# Patient Record
Sex: Female | Born: 1935 | Race: Black or African American | Hispanic: No | Marital: Married
Health system: Southern US, Community
[De-identification: ages and names within clinical notes are randomized; demographics above are authoritative.]

## PROBLEM LIST (undated history)

## (undated) DIAGNOSIS — I739 Peripheral vascular disease, unspecified: Secondary | ICD-10-CM

## (undated) DIAGNOSIS — E039 Hypothyroidism, unspecified: Secondary | ICD-10-CM

## (undated) DIAGNOSIS — F32A Depression, unspecified: Secondary | ICD-10-CM

## (undated) DIAGNOSIS — E785 Hyperlipidemia, unspecified: Secondary | ICD-10-CM

## (undated) DIAGNOSIS — K922 Gastrointestinal hemorrhage, unspecified: Secondary | ICD-10-CM

## (undated) DIAGNOSIS — I96 Gangrene, not elsewhere classified: Secondary | ICD-10-CM

## (undated) DIAGNOSIS — M869 Osteomyelitis, unspecified: Secondary | ICD-10-CM

## (undated) DIAGNOSIS — I639 Cerebral infarction, unspecified: Secondary | ICD-10-CM

## (undated) DIAGNOSIS — E119 Type 2 diabetes mellitus without complications: Secondary | ICD-10-CM

## (undated) DIAGNOSIS — G20 Parkinson's disease: Secondary | ICD-10-CM

## (undated) DIAGNOSIS — D539 Nutritional anemia, unspecified: Secondary | ICD-10-CM

## (undated) DIAGNOSIS — I82409 Acute embolism and thrombosis of unspecified deep veins of unspecified lower extremity: Secondary | ICD-10-CM

## (undated) DIAGNOSIS — Z992 Dependence on renal dialysis: Secondary | ICD-10-CM

## (undated) DIAGNOSIS — S82409A Unspecified fracture of shaft of unspecified fibula, initial encounter for closed fracture: Secondary | ICD-10-CM

## (undated) DIAGNOSIS — I10 Essential (primary) hypertension: Secondary | ICD-10-CM

## (undated) DIAGNOSIS — F329 Major depressive disorder, single episode, unspecified: Secondary | ICD-10-CM

## (undated) DIAGNOSIS — J302 Other seasonal allergic rhinitis: Secondary | ICD-10-CM

## (undated) DIAGNOSIS — I4891 Unspecified atrial fibrillation: Secondary | ICD-10-CM

## (undated) DIAGNOSIS — F0390 Unspecified dementia without behavioral disturbance: Secondary | ICD-10-CM

## (undated) DIAGNOSIS — G20A1 Parkinson's disease without dyskinesia, without mention of fluctuations: Secondary | ICD-10-CM

## (undated) DIAGNOSIS — S82209A Unspecified fracture of shaft of unspecified tibia, initial encounter for closed fracture: Secondary | ICD-10-CM

## (undated) DIAGNOSIS — N186 End stage renal disease: Secondary | ICD-10-CM

## (undated) HISTORY — DX: End stage renal disease: N18.6

## (undated) HISTORY — DX: Type 2 diabetes mellitus without complications: E11.9

## (undated) HISTORY — DX: Peripheral vascular disease, unspecified: I73.9

## (undated) HISTORY — PX: COLON SURGERY: SHX602

## (undated) HISTORY — DX: Gastrointestinal hemorrhage, unspecified: K92.2

## (undated) HISTORY — PX: CHOLECYSTECTOMY: SHX55

## (undated) HISTORY — PX: OTHER SURGICAL HISTORY: SHX169

## (undated) HISTORY — PX: DG AV DIALYSIS GRAFT DECLOT OR: HXRAD813

## (undated) HISTORY — DX: Dependence on renal dialysis: Z99.2

## (undated) HISTORY — DX: Hyperlipidemia, unspecified: E78.5

## (undated) HISTORY — DX: Cerebral infarction, unspecified: I63.9

## (undated) HISTORY — DX: Essential (primary) hypertension: I10

## (undated) HISTORY — PX: LEG AMPUTATION ABOVE KNEE: SHX117

## (undated) HISTORY — DX: Acute embolism and thrombosis of unspecified deep veins of unspecified lower extremity: I82.409

## (undated) HISTORY — PX: FINGER AMPUTATION: SHX636

---

## 2002-09-03 ENCOUNTER — Ambulatory Visit (HOSPITAL_COMMUNITY): Admission: RE | Admit: 2002-09-03 | Discharge: 2002-09-03 | Payer: Self-pay | Admitting: Internal Medicine

## 2002-09-06 ENCOUNTER — Encounter: Payer: Self-pay | Admitting: Vascular Surgery

## 2002-09-06 ENCOUNTER — Ambulatory Visit (HOSPITAL_COMMUNITY): Admission: RE | Admit: 2002-09-06 | Discharge: 2002-09-06 | Payer: Self-pay | Admitting: Vascular Surgery

## 2004-02-07 HISTORY — PX: TRANSTHORACIC ECHOCARDIOGRAM: SHX275

## 2004-03-08 HISTORY — PX: CARDIAC CATHETERIZATION: SHX172

## 2004-11-20 ENCOUNTER — Ambulatory Visit: Payer: Self-pay | Admitting: Cardiology

## 2005-05-03 ENCOUNTER — Ambulatory Visit: Payer: Self-pay | Admitting: Cardiology

## 2005-07-11 ENCOUNTER — Ambulatory Visit: Payer: Self-pay | Admitting: Cardiology

## 2007-02-17 ENCOUNTER — Ambulatory Visit: Payer: Self-pay | Admitting: Cardiology

## 2008-11-23 ENCOUNTER — Ambulatory Visit: Payer: Self-pay | Admitting: Cardiology

## 2009-10-22 ENCOUNTER — Encounter: Payer: Self-pay | Admitting: Physician Assistant

## 2009-10-23 ENCOUNTER — Ambulatory Visit: Payer: Self-pay | Admitting: Cardiology

## 2009-10-23 ENCOUNTER — Encounter: Payer: Self-pay | Admitting: Physician Assistant

## 2009-11-14 ENCOUNTER — Ambulatory Visit: Payer: Self-pay | Admitting: Cardiology

## 2009-11-14 DIAGNOSIS — I5022 Chronic systolic (congestive) heart failure: Secondary | ICD-10-CM | POA: Insufficient documentation

## 2009-11-22 ENCOUNTER — Ambulatory Visit: Payer: Self-pay | Admitting: Cardiology

## 2010-01-04 ENCOUNTER — Encounter: Payer: Self-pay | Admitting: Cardiology

## 2010-01-05 ENCOUNTER — Encounter: Payer: Self-pay | Admitting: Cardiology

## 2010-01-05 ENCOUNTER — Encounter: Payer: Self-pay | Admitting: Physician Assistant

## 2010-01-08 ENCOUNTER — Encounter: Payer: Self-pay | Admitting: Cardiology

## 2010-01-10 ENCOUNTER — Encounter: Payer: Self-pay | Admitting: Physician Assistant

## 2010-01-11 ENCOUNTER — Encounter: Payer: Self-pay | Admitting: Physician Assistant

## 2010-01-15 ENCOUNTER — Encounter (INDEPENDENT_AMBULATORY_CARE_PROVIDER_SITE_OTHER): Payer: Self-pay | Admitting: *Deleted

## 2010-01-20 ENCOUNTER — Encounter: Payer: Self-pay | Admitting: Cardiology

## 2010-01-20 ENCOUNTER — Encounter: Payer: Self-pay | Admitting: Physician Assistant

## 2010-01-27 ENCOUNTER — Encounter: Payer: Self-pay | Admitting: Cardiology

## 2010-01-28 ENCOUNTER — Encounter: Payer: Self-pay | Admitting: Cardiology

## 2010-01-31 ENCOUNTER — Encounter: Payer: Self-pay | Admitting: Physician Assistant

## 2010-01-31 ENCOUNTER — Encounter: Payer: Self-pay | Admitting: Cardiology

## 2010-02-16 ENCOUNTER — Ambulatory Visit: Admit: 2010-02-16 | Payer: Self-pay | Admitting: Cardiology

## 2010-03-06 NOTE — Assessment & Plan Note (Signed)
Summary: eph/rm   Visit Type:  Follow-up Primary Rommel [place]ston:  Dhruv Vyas,MD   History of Present Illness: patient presents for post hospital followup  Recently referred to [place] here at [facility] with acute congestive heart failure. She presented with history of normal coronary arteries by catheterization in 2006. She had a normal adenosine Cardiolite in 2009. We diagnosed her with acute/chronic DHF, and referred her for a 2-D echo. Of note, serial cardiac markers were nondiagnostic, with flat troponins in the 0.15-0.2 range, with normal MBs. Of note, she does have ESRD, on hemodialysis.  2-D echo, workup indicated new LVD (EF 30-35%). Dr. Andee Lineman questioned whether this was secondary to uremic cardiomyopathy. She was started on low dose Coreg. He recommended a repeat echo in 6-8 weeks.  Clinically, she appears to be doing better, per her caretaker. Of note, however, the patient does have significant dementia, and is a very poor historian.  Current Medications (verified): 1)  Namenda 10 Mg Tabs (Memantine Hcl) .... Take 1 Tablet By Mouth Two Times A Day 2)  Lipitor 10 Mg Tabs (Atorvastatin Calcium) .... Take 1 Tablet By Mouth Once A Day 3)  Ramipril 5 Mg Caps (Ramipril) .... Take 2 By Mouth in Morning and 1 in Pm 4)  Fluoxetine Hcl 40 Mg Caps (Fluoxetine Hcl) .... Take 1 Tablet By Mouth Once A Day 5)  Donepezil Hcl 10 Mg Tabs (Donepezil Hcl) .... Take 1 Tablet By Mouth Once A Day 6)  Amlodipine Besylate 10 Mg Tabs (Amlodipine Besylate) .... Take 1 Tablet By Mouth Once A Day 7)  Glipizide Xl 10 Mg Xr24h-Tab (Glipizide) .... Take 1 Tablet By Mouth Two Times A Day 8)  Rena-Vite  Tabs (B Complex-C-Folic Acid) .... Take 1 Tablet By Mouth Once A Day 9)  Coreg 6.25 Mg Tabs (Carvedilol) .... Take 1 Tablet By Mouth Two Times A Day 10)  Lantus 100 Unit/ml Soln (Insulin Glargine) .... Use As Directed 11)  Aspirin 81 Mg Tbec (Aspirin) .... Take 1 Tablet By Mouth Once A Day  Allergies (verified): No Known  Drug Allergies  Comments:  Nurse/Medical Assistant: [name] medication list and allergies were reviewed with the patient and were updated in the Medication and Allergy Lists.  Past History:  Past Medical History: cardiomyopathy... EF 30-35%% by 2-D echo, 9/11 (normal cors/LVF by catheterization in 2006) ESRD, on hemodialysis IDDM HTN History of stroke Mitral regurgitation Anemia PACs  Review of Systems       No fevers, chills, hemoptysis, dysphagia, melena, hematocheezia, hematuria, rash, claudication, orthopnea, pnd, pedal edema. All other systems negative.   Vital Signs:  Patient profile:   77 year old female Height:      68 inches Weight:      190 pounds BMI:     28.99 Pulse rate:   81 / minute BP sitting:   181 / 103  (left arm) Cuff size:   large  Vitals Entered By: Carlye Grippe (November 14, 2009 11:23 AM)  Nutrition Counseling: Patient's BMI is greater than 25 and therefore counseled on weight management options.  Physical Exam  Additional Exam:  GEN: 77 year old female, obese, sitting upright, in no distress HEENT: NCAT,PERRLA,EOMI NECK: palpable pulses, no bruits; no JVD; no TM LUNGS: CTA bilaterally HEART: RRR (S1S2); no significant murmurs; no rubs; no gallops ABD: soft, NT; intact BS EXT: intact distal pulses; no significant edema SKIN: warm, dry MUSC: no obvious deformity NEURO: A/O (x3)     Impression & Recommendations:  Problem # 1:  CHRONIC SYSTOLIC  HEART FAILURE (ICD-428.22)  recommend optimizing medical therapy. Will increase carvedilol to 6.25 mg b.i.d. We'll repeat 2-D echo in 2 months, for reassessment of LVF. Will defer ischemic workup for now, per Dr. Margarita Mail recommendation. patient had normal cardiac catheterization 2006, and normal Cardiolite in 2009. Question uremic cardiomyopathy.  Problem # 2:  HYPERTENSION (ICD-401.9)  carvedilol will be increased to 6.25 mg b.i.d. may need to up titrate ACE inhibitor, if blood pressure  remains uncontrolled.  Problem # 3:  DM (ICD-250.00) Assessment: Comment Only  Problem # 4:  ESRD (ICD-585.6)  continue hemodialysis, as scheduled.  Other Orders: 2-D Echocardiogram (2D Echo)  Patient Instructions: 1)  2D Echo in 2 months 2)  Increase Coreg to 6.25mg  two times a day  3)  Start Aspirin 81mg  daily 4)  Follow up in  3 months Prescriptions: COREG 6.25 MG TABS (CARVEDILOL) Take 1 tablet by mouth two times a day  #60 x 6   Entered by:   Hoover Brunette, LPN   Authorized by:   Nelida Meuse, PA-C   Signed by:   Hoover Brunette, LPN on 32/20/2542   Method used:   Electronically to        [facility]* (retail)       [address]       [place], [place]  [address]       Ph: [phone]       Fax: [phone]   RxID:   [id number]  I have reviewed and approved all prescriptions at the time of the office visit. Nelida Meuse, PA-C  November 14, 2009 12:19 PM

## 2010-03-06 NOTE — Consult Note (Signed)
Summary: [facility]OLOG[facility] CONSULT/ [facility]  CARD[facility]OLOG[facility] CONSULT/ [facility]   Imported By: Zachary George 11/14/2009 12:03:17  _____________________________________________________________________  External Attachment:    Type:   Image     Comment:   External Document

## 2010-03-08 NOTE — Letter (Signed)
Summary: Engineer, materials at [facility]  [address]   [place], [place] [address]   Phone: [phone]  Fax: [phone]        January 15, 2010 MRN: [id number]   Katrina Meyer [address] [facility] [address] [place], [place]  [address]   Dear Ms. GEISINGER,  Your test ordered by Selena Batten has been reviewed by your physician (or physician assistant) and was found to be normal or stable. Your physician (or physician assistant) felt no changes were needed at this time.  __X__ Echocardiogram - improved to 45-50%  ____ Cardiac Stress Test  ____ Lab Work  ____ Peripheral vascular study of arms, legs or neck  ____ CT scan or X-ray  ____ Lung or Breathing test  ____ Other:   Thank you.   Hoover Brunette, LPN    Duane Boston, M.D., F.A.C.C. Thressa Sheller, M.D., F.A.C.C. Oneal Grout, M.D., F.A.C.C. Cheree Ditto, M.D., F.A.C.C. Daiva Nakayama, M.D., F.A.C.C. Kenney Houseman, M.D., F.A.C.C. Jeanne Ivan, PA-C

## 2010-03-08 NOTE — Letter (Signed)
Summary: [facility] D/C DR. DHRUV VYAS  [facility] D/C DR. DHRUV VYAS   Imported By: Zachary George 02/16/2010 11:12:13  _____________________________________________________________________  External Attachment:    Type:   Image     Comment:   External Document

## 2010-03-08 NOTE — Consult Note (Signed)
Summary: [facility]OLOGY CONSULT/ [facility]  [facility]OLOG[facility] CON[facility]ULT/ [facility]   Imported By: Zachary George 02/16/2010 11:00:14  _____________________________________________________________________  External Attachment:    Type:   Image     Comment:   External Document

## 2010-03-08 NOTE — Consult Note (Signed)
Summary: CONSULT-DR. BEFEKADU  CONSULT-DR. BEFEKADU   Imported By: Zachary George 02/16/2010 11:13:10  _____________________________________________________________________  External Attachment:    Type:   Image     Comment:   External Document

## 2010-03-08 NOTE — Letter (Signed)
Summary: [facility] D/C DR. DHRUV VYAS  [facility] D/C DR. DHRUV VYAS   Imported By: Zachary George 02/16/2010 11:03:22  _____________________________________________________________________  External Attachment:    Type:   Image     Comment:   External Document

## 2010-03-08 NOTE — Medication Information (Signed)
Summary: [facility] D/C MEDICATION SHEET ORDER  [facility] D/C MEDICATION SHEET ORDER   Imported By: Zachary George 02/16/2010 11:18:06  _____________________________________________________________________  External Attachment:    Type:   Image     Comment:   External Document

## 2010-03-08 NOTE — Medication Information (Signed)
Summary: [facility] D/C MEDICATION ORDER SHEET  [facility] D/C MEDICATION ORDER SHEET   Imported By: Zachary George 02/16/2010 10:59:19  _____________________________________________________________________  External Attachment:    Type:   Image     Comment:   External Document

## 2010-06-22 NOTE — Op Note (Signed)
NAME:  Katrina Meyer, Katrina Meyer                          ACCOUNT NO.:  [id number]   MEDICAL RECORD NO.:  [id number]                   PATIENT TYPE:  OIB   LOCATION:  2866                                 FACILITY:  [facility]   PHYSICIAN:  Di Kindle. Edilia Bo, M.D.        DATE OF BIRTH:  Jan 11, 1933   DATE OF PROCEDURE:  09/06/2002  DATE OF DISCHARGE:                                 OPERATIVE REPORT   PREOPERATIVE DIAGNOSIS:  Chronic renal failure.   POSTOPERATIVE DIAGNOSIS:  Chronic renal failure.   PROCEDURES:  1. Ultrasound of bilateral internal jugular veins.  2. Attempted placement of right internal jugular Diatek catheter.  3. Placement of left internal jugular Diatek catheter (32 cm.   SURGEON:  Di Kindle. Edilia Bo, M.D.   ANESTHESIA:  Local with sedation.   TECHNIQUE:  The patient was taken to the operating room, sedated by  anesthesia.  The neck and upper chest were prepped and draped in the usual  sterile fashion.  After the skin was infiltrated with 1% lidocaine, the  right internal jugular vein was easily cannulated; however, I could not  thread the guide wire.  I did shoot a venogram which showed that the vein  was tortuous but did appear to be patent.  However, despite multiple  attempts with the J-wire and angled Glidewire, I was unable to pass the wire  into the superior vena cava.  Rather than persist, I elected to proceed with  a left-sided approach.  After the skin was anesthetized with 1% lidocaine,  the left internal jugular vein was cannulated, and a guide wire introduced  into the superior vena cava under fluoroscopic control.  The tract over the  wire was then dilated, and then the dilator and Peel-Away sheath were passed  over the wire, and the dilator removed.  The catheter was threaded over the  wire and then passed through the Peel-Away sheath and positioned in the  right atrium.  The Peel-Away sheath was then removed.  The catheter was then  carefully  positioned, and then an exit site was selected and the skin  anesthetized between the two areas.  The catheter was then brought through  the tunnel and cut to the appropriate length, and the distal ports were  attached.  Both ports withdrew easily, were then flushed with heparinized  saline, and filled with concentrated heparin.  The catheter was secured at  its exit site with a 3-0 nylon suture.  The IJ cannulation site was closed  with a 4-0 subcuticular stitch.  Sterile dressing was applied.  The patient  tolerated the procedure well and was transferred to the recovery room in  satisfactory condition.  Di Kindle. Edilia Bo, M.D.    CSD/MEDQ  D:  09/06/2002  T:  09/06/2002  Job:  [id number]

## 2010-06-25 ENCOUNTER — Encounter (INDEPENDENT_AMBULATORY_CARE_PROVIDER_SITE_OTHER): Payer: Medicare Other

## 2010-06-25 ENCOUNTER — Encounter (INDEPENDENT_AMBULATORY_CARE_PROVIDER_SITE_OTHER): Payer: Medicare Other | Admitting: Vascular Surgery

## 2010-06-25 DIAGNOSIS — M629 Disorder of muscle, unspecified: Secondary | ICD-10-CM

## 2010-06-25 DIAGNOSIS — M79609 Pain in unspecified limb: Secondary | ICD-10-CM

## 2010-06-25 NOTE — Consult Note (Signed)
NEW PATIENT CONSULTATION  Katrina Meyer, Katrina Meyer DOB:  1932-03-28                                       06/25/2010 CHART#:[id number]  The patient is a 77 year old female with diabetes and end-stage renal disease, dialyzes Monday, Wednesday and Friday in [place].  She developed problems with her right first toe in March of this year, went to the [facility] wound [facility], foot continued to have progressive ischemic changes, was not seen again in the [place] wound center but was seen in the [place] wound center by Dr. Mills Koller last week who referred her here for further evaluation.  She has developed progressive ischemia of the right first and fifth toes as well as a pressure sore on the right heel.  She had had some ulcerations in her left leg December 2011 which healed with wound care.  She does not ambulate much.  CHRONIC MEDICAL PROBLEMS: 1. Hypertension. 2. Hyperlipidemia. 3. Diabetes mellitus type 1. 4. End-stage renal disease, hemodialysis Monday, Wednesday, Friday. 5. History of hemicolectomy for diverticulitis. 6. History of mini strokes. 7. Negative for coronary artery disease or COPD.  SOCIAL HISTORY:  She is married, has 2 children.  Does not use tobacco or alcohol.  FAMILY HISTORY:  Positive for stroke and diabetes in her mother, coronary artery disease in her father.  REVIEW OF SYSTEMS:  Positive for lower extremity discomfort.  Walks with a walker and assistance.  Has ulcerations of both toes, end-stage renal disease.  Has history of mini strokes but no specific hemiparesis or aphasia according to the daughter.  All other systems are negative on complete review of systems.  PHYSICAL EXAMINATION:  Vital signs:  Blood pressure 182/58, heart rate 87, respirations 24.  General:  She is an elderly, chronically ill- appearing female who is in no apparent distress, alert and oriented x3. HEENT:  Exam is normal for age.  EOMs intact.  Lungs:   Clear to auscultation.  No rhonchi or wheezing.  Cardiovascular:  Regular rhythm. No murmurs.  Carotid pulses 3+.  No audible bruits.  Abdomen:  Obese. No palpable masses.  Musculoskeletal:  Free of major deformities. Neurological:  Normal.  Skin:  Exam reveals gangrenous ulcer in the medial aspect of the right first toe, ischemic appearing right fifth toe and some pressure sore involving the heel.  There is no full thickness loss at this time.  Left leg is free of ulcers but has hyperpigmentation.  Arterial exam reveals 3+ femoral pulses bilaterally. No popliteal or distal pulses are palpable.  Today I ordered lower extremity arterial Doppler and duplex scan which I reviewed and interpreted.  She does have what appears to be patent superficial femoral and popliteal arteries bilaterally with no areas of high velocity.  All of her vessels are severely calcified and she has severe tibial disease bilaterally with noncompressible vessels.  I suspect that this is not a salvageable extremity but we will proceed with an angiogram to see if anything would be amenable to PTA or stenting in the right leg versus possible bypass which is very unlikely. I explained to the family that most likely she will end up needing a below knee amputation in the near future.  We will schedule her angiogram on Wednesday of this week by Dr. Myra Gianotti or Dr. Imogene Burn to see what our options are.    Quita Skye Hart [place], M.D. Electronically  Signed  JDL/MEDQ  D:  06/25/2010  T:  06/25/2010  Job:  [id number]  cc:   Doreen Beam, MD Theresia Majors. Tanda Rockers, M.D.

## 2010-06-25 NOTE — Procedures (Unsigned)
LOWER EXTREMITY ARTERIAL DUPLEX  INDICATION:  Bilateral lower extremity leg pain with foot ulcers.  HISTORY: Diabetes:  Yes. Cardiac:  No. Hypertension:  Yes. Smoking:  No. Previous Surgery:  No.  SINGLE LEVEL ARTERIAL EXAM                         RIGHT                LEFT Brachial:               211                  202 Anterior tibial:        >255                 >255 Posterior tibial:       >255                 >255 Peroneal: Ankle/Brachial Index:   Noncompressible      Noncompressible  LOWER EXTREMITY ARTERIAL DUPLEX EXAM  DUPLEX:  Scattered atherosclerotic plaque noted throughout bilateral lower extremities.  IMPRESSION: 1. Noncompressible ankle brachial indices. 2. Please see the following worksheet for all velocity measurements.  ___________________________________________ Quita Skye. Hart Rochester, M.D.  EM/MEDQ  D:  06/25/2010  T:  06/25/2010  Job:  [id number]

## 2010-06-27 ENCOUNTER — Ambulatory Visit (HOSPITAL_COMMUNITY)
Admission: RE | Admit: 2010-06-27 | Discharge: 2010-06-27 | Disposition: A | Discharge status: Home / Self Care | Payer: Medicare Other | Source: Ambulatory Visit | Attending: Surgery | Admitting: Surgery

## 2010-06-27 DIAGNOSIS — L97509 Non-pressure chronic ulcer of other part of unspecified foot with unspecified severity: Secondary | ICD-10-CM | POA: Insufficient documentation

## 2010-06-27 DIAGNOSIS — L98499 Non-pressure chronic ulcer of skin of other sites with unspecified severity: Secondary | ICD-10-CM

## 2010-06-27 DIAGNOSIS — E109 Type 1 diabetes mellitus without complications: Secondary | ICD-10-CM | POA: Insufficient documentation

## 2010-06-27 DIAGNOSIS — L8990 Pressure ulcer of unspecified site, unspecified stage: Secondary | ICD-10-CM | POA: Insufficient documentation

## 2010-06-27 DIAGNOSIS — I739 Peripheral vascular disease, unspecified: Secondary | ICD-10-CM

## 2010-06-27 DIAGNOSIS — N186 End stage renal disease: Secondary | ICD-10-CM | POA: Insufficient documentation

## 2010-06-27 DIAGNOSIS — K5730 Diverticulosis of large intestine without perforation or abscess without bleeding: Secondary | ICD-10-CM | POA: Insufficient documentation

## 2010-06-27 DIAGNOSIS — Z8673 Personal history of transient ischemic attack (TIA), and cerebral infarction without residual deficits: Secondary | ICD-10-CM | POA: Insufficient documentation

## 2010-06-27 DIAGNOSIS — L89609 Pressure ulcer of unspecified heel, unspecified stage: Secondary | ICD-10-CM | POA: Insufficient documentation

## 2010-06-27 DIAGNOSIS — I120 Hypertensive chronic kidney disease with stage 5 chronic kidney disease or end stage renal disease: Secondary | ICD-10-CM | POA: Insufficient documentation

## 2010-06-27 DIAGNOSIS — Z992 Dependence on renal dialysis: Secondary | ICD-10-CM | POA: Insufficient documentation

## 2010-06-27 LAB — POCT I-STAT, CHEM 8
Calcium, Ion: 1.2 mmol/L (ref 1.12–1.32)
Creatinine, Ser: 6.3 mg/dL — ABNORMAL HIGH (ref 0.4–1.2)
Hemoglobin: 11.9 g/dL — ABNORMAL LOW (ref 12.0–15.0)
Sodium: 139 mEq/L (ref 135–145)
TCO2: 31 mmol/L (ref 0–100)

## 2010-06-27 LAB — POCT ACTIVATED CLOTTING TIME
Activated Clotting Time: 217 seconds
Activated Clotting Time: 199 seconds

## 2010-06-27 LAB — GLUCOSE, CAPILLARY: Glucose-Capillary: 161 mg/dL — ABNORMAL HIGH (ref 70–99)

## 2010-07-12 ENCOUNTER — Encounter (INDEPENDENT_AMBULATORY_CARE_PROVIDER_SITE_OTHER): Payer: Medicare Other

## 2010-07-12 DIAGNOSIS — I739 Peripheral vascular disease, unspecified: Secondary | ICD-10-CM

## 2010-07-12 DIAGNOSIS — Z48812 Encounter for surgical aftercare following surgery on the circulatory system: Secondary | ICD-10-CM

## 2010-07-13 NOTE — Procedures (Unsigned)
LOWER EXTREMITY ARTERIAL DUPLEX    ****  CORRECTED  REPORT  ****  INDICATION:  Right left lower extremity stent.  HISTORY: Diabetes:  Yes. Cardiac:  No. Hypertension:  Yes. Smoking:  No. Previous Surgery:  Right left popliteal artery PTA/stent on 06/27/2010.  SINGLE LEVEL ARTERIAL EXAM                         RIGHT                LEFT Brachial:               184 Anterior tibial:        Biphasic             Monophasic Posterior tibial:       Monophasic           Monophasic Peroneal: Ankle/Brachial Index:                        TBI equals 0.43  LOWER EXTREMITY ARTERIAL DUPLEX EXAM  DUPLEX:  Biphasic Doppler waveforms noted throughout the right left common femoral and superficial femoral arteries with no increase in velocity. Monophasic Doppler waveforms noted throughout the right left popliteal artery with no increase in velocities. Decreased visualization of the right left lower extremity arterial system due to calcific shadowing.  IMPRESSION: 1. Patent right  left popliteal artery stent with no hemodynamically     significant increase in velocities noted, based on limited     visualization. 2. Unable to obtain bilateral and brachial indices due to known     noncompressible vessels.  The right toe brachial index was not     obtained due to wound location.  The left toe brachial index     suggests moderately decreased perfusion of the left lower extremity     digits.  ___________________________________________ Quita Skye Hart [place], M.D.  CH/MEDQ  D:  07/13/2010  T:  07/13/2010  Job:  [id number]

## 2010-07-13 NOTE — Op Note (Signed)
NAMEJENNIGER, Katrina Meyer NO.:  [id number]  MEDICAL RECORD NO.:  [id number]           PATIENT TYPE:  O  LOCATION:  SDSC                         FACILITY:  [facility]  PHYSICIAN:  Lauryn Lizardi E. Tytus Strahle, MD  DATE OF BIRTH:  12-16-1932  DATE OF PROCEDURE:  06/27/2010 DATE OF DISCHARGE:  06/27/2010                              OPERATIVE REPORT   PROCEDURE: 1. Aortogram with right lower extremity runoff. 2. Angioplasty of right popliteal artery. 3. Stenting of right popliteal artery.  PREOPERATIVE DIAGNOSIS:  Nonhealing wounds, right foot.  POSTOPERATIVE DIAGNOSIS:  Nonhealing wounds, right foot.  ANESTHESIA:  Local.  OPERATIVE DETAILS:  After obtaining informed consent, the patient was taken to the PV Lab.  The patient was placed in supine position on angio table.  Both groins were prepped and draped in usual sterile fashion. Local anesthesia was infiltrated over the left common femoral artery. An introducer needle was used to cannulate the left common femoral artery and a 0.035 Versacore wire threaded into the abdominal aorta under fluoroscopic guidance.  A 5-French sheath was then placed over the guidewire into the left common femoral artery and this was thoroughly flushed with heparinized saline.  A 5-French pigtail catheter was then placed over the guidewire and an abdominal aortogram was obtained. Infrarenal abdominal aorta is patent.  The left and right renal arteries are patent.  The distal arterial arcades of both kidneys are diffusely atrophied.  The patient is a known hemodialysis patient.  The infrarenal abdominal aorta was calcified but there is no stenosis. The left and right common, external and internal iliac arteries were widely patent.  Next, a 5-French pigtail catheter was pulled back over a guidewire and 5-French crossover catheter was used to selectively catheterize the right common iliac and then the external iliac artery. The 5-French crossover  catheter was removed and exchanged for a 5-French straight catheter.  Right lower extremity arteriogram was obtained.  The right external iliac artery was patent.  The right common femoral artery was patent.  The right profunda femoris artery was patent.  The right superficial femoral artery was patent with a 50% stenosis in its distal third.  The above-knee popliteal artery has a subtotal occlusion extending over approximately 3 cm which was moderately calcified.  The below-knee popliteal artery was patent.  There was two-vessel runoff via the anterior tibial artery and the peroneal artery.  The anterior tibial artery fills the dorsalis pedis artery and there was a complete plantar arch which was filled posteriorly from the posterior communicating branch of the peroneal artery.  The anterior communicating branch of the peroneal artery was occluded.  The posterior tibial artery was occluded.  At this point, the 5-French sheath was exchanged for a 7-French Terumo sheath and this was advanced up and over the aortic bifurcation and into the proximal right superficial femoral artery over a guidewire.  The patient was then given 5000 units of intravenous heparin.  The patient was also given an additional 3000 units of heparin during the course of the case.  At this point, the 0.035 Versacore wire was exchanged for a 0.018 Spartacore  wire and this was advanced down to the level of the popliteal stenosis.  An 0.018 Quick-Cross catheter was also advanced over this for extra support.  Multiple attempts were made to advance the wire across the lesion but this would not go.  Therefore, the catheter and guidewire were removed and these were exchanged for a 4-French [place] catheter as well as a high torque Winn wire.  The Winn wire then easily advanced across the popliteal stenosis and I advanced the Se[place] catheter over this into the distal below-knee popliteal artery.  Contrast  angiogram was performed to confirm that we were within the lumen of the artery and there was still two-vessel runoff via the peroneal and posterior tibial arteries.  At this point, a 2 mm x 4 cm angioplasty balloon was brought up in the operative field and advanced to the level of the popliteal lesion using angiographic contrast mapping.  The angioplasty balloon was then centered over the popliteal lesion and inflated to 8 atmospheres for 30 seconds.  It was then advanced slightly further down and the additional lesion was also angioplastied again to 8 atmospheres for 30 seconds.  The balloon was then removed and a 5 x 60 Absolute Pro self-expanding stent was brought up in the operative field and advanced down to the level of popliteal lesion, however, this would not advance across the lesion.  Therefore, the popliteal stent was pulled back still in its delivery device and an additional angioplasty was performed using a 4 x 40 mm balloon which was advanced back down to level of the popliteal artery.  This was then inflated to 8 atmospheres for 1 minute.  Contrast angiogram was performed after this and this showed a residual calcified stenosis in the popliteal artery just above the level of the patella.  The Absolute Pro 5 x 60 mm stent was then brought back up in the operative field and advanced back over the guidewire and I was able to successfully cross the lesion with it at this point.  The stent was then deployed, centered over the lesion of the popliteal stenosis in the knee.  It was then postdilated with a 4 x 40 angioplasty balloon, two separate inflations were performed to make sure there was complete coverage of the stent.  A completion arteriogram was then performed which showed that there was wide patency of the popliteal stent.  The stent extends basically throughout the course of the entire above-knee popliteal artery and ends at the level of the patella.  The below-knee  popliteal artery is still patent.  The anterior tibial and peroneal arteries are still patent. There was two-vessel runoff to the foot with a dorsalis pedis artery preserved crossing into the foot.  The patient tolerated the procedure well up to this point.  At this point, the guidewire was pulled back slightly into the common femoral artery.  The dilator for the Terumo sheath was then placed back over the guidewire and the Terumo sheath pulled back into the left pelvis.  Guidewire was removed with the sheath left in place after thoroughly flushing to be removed after the ACT was less than 150.  The patient tolerated the procedure well and was given 300 mg of Plavix on the table to be continued for life as long as she tolerates this.  The patient tolerated the procedure well overall and there were no complications.  OPERATIVE FINDINGS: 1. Subtotal occlusion of right above-knee popliteal artery repaired     with Absolute  Pro 5 x 60 mm self-expanding stent with two-vessel     runoff via the anterior tibial and peroneal artery to the right     foot. 2. No significant aortoiliac occlusive disease. 3. The patient will continue to followup with Dr. Mills Koller in     the wound care center for further followup of her foot.  She will     continue Plavix for life as long as this is tolerated.     Janetta Hora. Shaylon Gillean, MD     CEF/MEDQ  D:  06/27/2010  T:  06/28/2010  Job:  [id number]  cc:   Quita Skye. Hart Rochester, M.D. Harold A. Tanda Rockers, M.D.  Electronically Signed by Fabienne Bruns MD on 07/13/2010 09:24:58 AM

## 2010-09-25 ENCOUNTER — Ambulatory Visit (HOSPITAL_COMMUNITY)
Admission: RE | Admit: 2010-09-25 | Discharge: 2010-09-25 | Disposition: A | Discharge status: Home / Self Care | Payer: Medicare Other | Source: Ambulatory Visit | Attending: Internal Medicine | Admitting: Internal Medicine

## 2010-09-25 DIAGNOSIS — M6281 Muscle weakness (generalized): Secondary | ICD-10-CM | POA: Insufficient documentation

## 2010-09-25 DIAGNOSIS — I10 Essential (primary) hypertension: Secondary | ICD-10-CM | POA: Insufficient documentation

## 2010-09-25 DIAGNOSIS — E119 Type 2 diabetes mellitus without complications: Secondary | ICD-10-CM | POA: Insufficient documentation

## 2010-09-25 DIAGNOSIS — IMO0001 Reserved for inherently not codable concepts without codable children: Secondary | ICD-10-CM | POA: Insufficient documentation

## 2010-09-25 DIAGNOSIS — R262 Difficulty in walking, not elsewhere classified: Secondary | ICD-10-CM | POA: Insufficient documentation

## 2010-09-25 NOTE — Patient Instructions (Addendum)
hep

## 2010-09-25 NOTE — Progress Notes (Signed)
Physical Therapy Evaluation  Patient Details  Name: Katrina Meyer MRN: [id number] Date of Birth: Apr 02, 1932  Today's Date: 09/25/2010 Time: 1500-1600 Time Calculation (min): 60 min Visit#: 1 of 8 Re-eval: 10/25/10  Past Medical History: No past medical history on file. Past Surgical History: No past surgical history on file.  Subjective Symptoms/Limitations Symptoms: Pt states that she has been having increased leg weakness for several months.  The patient currently is having difficulty coming from sit to stand. She is walking with a seated walker with assistance.  The patient states that she was able to walk in  the house with her walker without assistance several months ago.  The patient  needs moderate assist to get into bed.  PMH includes DM and demential. How long can you sit comfortably?: no problem How long can you stand comfortably?: unable to stand without UE support.   Pt will fall backwards without upper extremity support.  Holding into her walker Ms. Gafford is unable to stand greater than 2 minutes. How long can you walk comfortably?: Unable to walk greater than two minutes. Pain Assessment Currently in Pain?: No/denies  Precautions/Restrictions  Precautions Precautions: Fall  Prior Functioning  Home Living Type of Home: House Lives With: Spouse (sitters 24 hours.) Receives Help From: Personal care attendant Home Layout: Two level;Able to live on main level with bedroom/bathroom Home Access: Level entry Prior Function Level of Independence: Needs assistance with ADLs;Needs assistance with homemaking;Needs assistance with gait  Cognition  dementia  Sensation/Coordination/Flexibility  decreased  Assessment RLE Strength Right Hip Flexion: 2-/5 Right Hip Extension: 2-/5 Right Hip ABduction: 2-/5 Right Hip ADduction: 2-/5 Right Knee Flexion: 2/5 Right Knee Extension: 3-/5 Right Ankle Dorsiflexion: 3+/5 Right Ankle Plantar Flexion: 3+/5 LLE  Strength Left Hip Flexion: 2-/5 Left Hip Extension: 2/5 Left Hip ABduction: 2/5 Left Hip ADduction: 2/5 Left Knee Flexion: 2/5 Left Knee Extension: 3+/5 Left Ankle Dorsiflexion: 4/5 Left Ankle Plantar Flexion: 4/5  Mobility (including Balance)    Balance Balance Assessed:  (unable to stand without support.  Standing static is poor)  Exercise/Treatments For strengthening    Physical Therapy Assessment and Plan PT Assessment and Plan Clinical Impression Statement: Pt with decreased balance and strength will work on standing exercises to improve both while pt works on a HEP for supine strengthening Rehab Potential: Good PT Frequency: Min 2X/week PT Duration: 4 weeks PT Treatment/Interventions: Gait training;Functional mobility training;Balance training;Neuromuscular re-education PT Plan: see two times a week for 4 weeks.    Goals PT Short Term Goals Time to Complete Goals: 2 weeks PT Short Term Goal 1: I HEP PT Short Term Goal 2: Pt able to stand without UE support for 30 seconds PT Short Term Goal 3: Pt able to stand for 5 minutes. PT Short Term Goal 4: sit to stand with min assist PT Short Term Goal 5: bed mobility with min assist PT Long Term Goals PT Long Term Goal 1: Pt I in advance HEP PT Long Term Goal 2: Pt able to stand for one minute without UE support. Long Term Goal 3: Pt able to walk with walker for 10 minutes. Long Term Goal 4: LE strength to be increased by one grade to allow the above to occur.  Problem List Patient Active Problem List  Diagnoses  . DM  . HYPERTENSION  . CHRONIC SYSTOLIC HEART FAILURE  . ESRD  . Muscle weakness (generalized)  . Difficulty in walking    PT - End of Session Equipment Utilized During Treatment:  Gait belt;Other (comment) (seated walker) Activity Tolerance: Patient limited by fatigue General Behavior During Session: [facility] for tasks performed Cognition: [facility] for tasks performed   Suleika Donavan,CINDY 09/25/2010, 4:54  PM  Physician Documentation Your signature is required to indicate approval of the treatment plan as stated above.  Please sign and either send electronically or make a copy of this report for your files and return this physician signed original.   Please mark one 1.__approve of plan  2. ___approve of plan with the following conditions.   ______________________________                                                          _____________________ Physician Signature                                                                                                             Date

## 2010-09-27 ENCOUNTER — Ambulatory Visit (HOSPITAL_COMMUNITY)
Admission: RE | Admit: 2010-09-27 | Discharge: 2010-09-27 | Disposition: A | Discharge status: Home / Self Care | Payer: Medicare Other | Source: Ambulatory Visit | Attending: *Deleted | Admitting: *Deleted

## 2010-09-27 NOTE — Progress Notes (Signed)
Physical Therapy Treatment Patient Details  Name: Jenese Mischke MRN: [id number] Date of Birth: [birth year]  Today's Date: 09/27/2010 Time: 4098-1191 Time Calculation (min): 42 min Visit#: 2 of 8 Re-eval: 09/25/10  Subjective: Symptoms/Limitations Symptoms: "I can't"  pt needs constant verbal encouragement.  Pt can do more than she thinks she can. Pain Assessment Currently in Pain?: No/denies Multiple Pain Sites: No  Precautions/Restrictions     Mobility (including Balance) Ambulation/Gait Ambulation/Gait: Yes Ambulation/Gait Assistance: 5: Supervision Ambulation/Gait Assistance Details (indicate cue type and reason): verbal cuing to continue. Ambulation Distance (Feet): 45 Feet Assistive device: 4-wheeled walker Gait velocity: slow     Exercise/Treatments Balance Exercises:  Standing Marching in place 10 reps Hip abduction 10 reps Hip extension 10 reps Functional squat 10 reps  Heel Raises: 10 reps Sit to stand 10 reps Bike x 6 minutes.      Physical Therapy Assessment and Plan PT Assessment and Plan Clinical Impression Statement: Pt has poor endurance.  Needed three small rest breaks to complete program. Rehab Potential: Good PT Frequency: Min 2X/week PT Duration: 4 weeks PT Treatment/Interventions: Gait training;Therapeutic exercise PT Plan: continue to work with strengthening and balance.    Goals    Problem List Patient Active Problem List  Diagnoses  . DM  . HYPERTENSION  . CHRONIC SYSTOLIC HEART FAILURE  . ESRD  . Muscle weakness (generalized)  . Difficulty in walking    PT - End of Session Equipment Utilized During Treatment: Gait belt Activity Tolerance: Patient limited by fatigue General Behavior During Session: [facility] for tasks performed Cognition: [facility] for tasks performed  Lonn Im,CINDY 09/27/2010, 4:19 PM

## 2010-10-02 ENCOUNTER — Ambulatory Visit (HOSPITAL_COMMUNITY)
Admission: RE | Admit: 2010-10-02 | Discharge: 2010-10-02 | Disposition: A | Discharge status: Home / Self Care | Payer: Medicare Other | Source: Ambulatory Visit | Attending: *Deleted | Admitting: *Deleted

## 2010-10-02 NOTE — Progress Notes (Signed)
Physical Therapy Treatment Patient Details  Name: Katrina Meyer MRN: [id number] Date of Birth: 05-01-1932  Today's Date: 10/02/2010 Time: 1530-1610 Time Calculation (min): 40 min Visit#: 3 of 8 Re-eval: 10/25/10 Charges: Therex x 20' Gait x 10'  Subjective: Symptoms/Limitations Symptoms: "I feel weak"   Pain Assessment Currently in Pain?: No/denies   Mobility (including Balance) Ambulation/Gait Ambulation/Gait: Yes Ambulation/Gait Assistance: 5: Supervision Ambulation/Gait Assistance Details (indicate cue type and reason): VC for motivation to continue to walk. Ambulation Distance (Feet): 150 Feet (1 x 30' 1x 20' 1 x 100') Assistive device: 4-wheeled walker Gait velocity: slow     Exercise/Treatments Marching in place 10 reps  Hip abduction 10 reps  Hip adduction isometric with orange ball 10reps Hip extension 10 reps   Heel Raises 10 reps  LAQ B 10 reps Sit to stand 3 reps   Physical Therapy Assessment and Plan PT Assessment and Plan Clinical Impression Statement: Pt request for help with tasks that she can complete independtly. Pt requires constant motivation to complete exercises. PT Plan: Continue to progress per PT POC.     Problem List Patient Active Problem List  Diagnoses  . DM  . HYPERTENSION  . CHRONIC SYSTOLIC HEART FAILURE  . ESRD  . Muscle weakness (generalized)  . Difficulty in walking    PT - End of Session Equipment Utilized During Treatment: Gait belt Activity Tolerance: Patient tolerated treatment well General Behavior During Session: [facility] for tasks performed Cognition: [facility] for tasks performed  Antonieta Iba 10/02/2010, 5:47 PM

## 2010-10-04 ENCOUNTER — Ambulatory Visit (HOSPITAL_COMMUNITY): Payer: Medicare Other | Admitting: Physical Therapy

## 2010-10-09 ENCOUNTER — Ambulatory Visit (HOSPITAL_COMMUNITY)
Admission: RE | Admit: 2010-10-09 | Discharge: 2010-10-09 | Disposition: A | Discharge status: Home / Self Care | Payer: Medicare Other | Source: Ambulatory Visit | Attending: Internal Medicine | Admitting: Internal Medicine

## 2010-10-09 DIAGNOSIS — IMO0001 Reserved for inherently not codable concepts without codable children: Secondary | ICD-10-CM | POA: Insufficient documentation

## 2010-10-09 DIAGNOSIS — E119 Type 2 diabetes mellitus without complications: Secondary | ICD-10-CM | POA: Insufficient documentation

## 2010-10-09 DIAGNOSIS — I10 Essential (primary) hypertension: Secondary | ICD-10-CM | POA: Insufficient documentation

## 2010-10-09 DIAGNOSIS — M6281 Muscle weakness (generalized): Secondary | ICD-10-CM | POA: Insufficient documentation

## 2010-10-09 DIAGNOSIS — R262 Difficulty in walking, not elsewhere classified: Secondary | ICD-10-CM | POA: Insufficient documentation

## 2010-10-09 NOTE — Progress Notes (Signed)
Physical Therapy Treatment Patient Details  Name: Katrina Meyer MRN: [id number] Date of Birth: 1932-04-19  Today's Date: 10/09/2010 Time: 7829-5621 Time Calculation (min): 46 min Visit#: 4 of 8 Re-eval: 10/25/10 Charges: There act x 5' Therex x 38'  Subjective: Symptoms/Limitations Symptoms: No pain. I'm tired. Pain Assessment Currently in Pain?: No/denies   Exercise/Treatments Marching in place 10 reps  Hip abduction 10 reps  Hip adduction isometric with orange ball 10reps  Hip extension 10 reps  Heel Raises 10 reps  LAQ B 10 reps  Sit to stand 5 reps Functional squats x 10 Gait around department x 5'  Physical Therapy Assessment and Plan PT Assessment and Plan Clinical Impression Statement: Pt with increased activity tolerance this tx. Pt able to do multiple standing exercises consecutively without needing to sit. Pt tolerated 5' of ambulation before needing to sit. PT Treatment/Interventions: Functional mobility training;Therapeutic exercise PT Plan: Continue to progress activity tolerance and strength.     Problem List Patient Active Problem List  Diagnoses  . DM  . HYPERTENSION  . CHRONIC SYSTOLIC HEART FAILURE  . ESRD  . Muscle weakness (generalized)  . Difficulty in walking    PT - End of Session Activity Tolerance: Patient tolerated treatment well General Behavior During Session: [facility] for tasks performed Cognition: [facility] for tasks performed  Antonieta Iba 10/09/2010, 5:57 PM

## 2010-10-11 ENCOUNTER — Ambulatory Visit (HOSPITAL_COMMUNITY)
Admission: RE | Admit: 2010-10-11 | Discharge: 2010-10-11 | Disposition: A | Discharge status: Home / Self Care | Payer: Medicare Other | Source: Ambulatory Visit | Attending: Internal Medicine | Admitting: Internal Medicine

## 2010-10-11 NOTE — Progress Notes (Deleted)
Physical Therapy Treatment Patient Details  Name: Teckla Christiansen MRN: [id number] Date of Birth: May 19, 1932  Today's Date: 10/11/2010 Time: 4098-1191 Time Calculation (min): 35 min Visit#: 5 of 8 Re-eval: 10/25/10  Subjective: Symptoms/Limitations Symptoms: Pt. stated she's exhausted from being at the hospital (her husband is an inpatient for pneumonia) Pain Assessment Currently in Pain?: No/denies  Exercise/Treatments Hip abduction 10 reps bilaterally Hip adduction isometric with orange ball 5reps  Heel Raises 10 reps  Sit to stand 4 reps  Functional squats x 10   *Unable to complete session today secondary to exhaustion; pt became tearful during session due to husbands hospitalization.  Physical Therapy Assessment and Plan PT Assessment and Plan Clinical Impression Statement: Unable to complete session secondary to patient getting upset/crying and exhausted due to husband being hospitalized. PT Treatment/Interventions: Therapeutic exercise PT Plan: Progress as able     Problem List Patient Active Problem List  Diagnoses  . DM  . HYPERTENSION  . CHRONIC SYSTOLIC HEART FAILURE  . ESRD  . Muscle weakness (generalized)  . Difficulty in walking    PT - End of Session Equipment Utilized During Treatment: Gait belt Activity Tolerance: Patient limited by fatigue General Behavior During Session: Other (comment) (exhausted/emotional) Cognition: [facility] for tasks performed  Lurena Nida 10/11/2010, 4:25 PM

## 2010-10-11 NOTE — Progress Notes (Addendum)
Physical Therapy Treatment Patient Details  Name: Katrina Meyer MRN: [id number] Date of Birth: 1932/05/23  Today's Date: 10/11/2010 Time: 4696-2952 Time Calculation (min): 35 min Visit#: 5 of 8 Re-eval: 10/25/10 Charges:  Therex 12', gait 12'  Subjective: Symptoms/Limitations Symptoms: Pt. stated she's exhausted from being at the hospital (her husband is an inpatient for pneumonia) Pain Assessment Currently in Pain?: No/denies  **Treatment started by Seth Bake, PTA  Exercise/Treatments **Unable to complete session secondary to pt. exhaused and upset Standing: Hip abduction bilateral 10X Squats 10X heelraises 10X Seated: Sit-stands 3X  Limited ambulation today; 30'X2 with RW.  Physical Therapy Assessment and Plan PT Assessment and Plan Clinical Impression Statement: Unable to complete session secondary to patient getting upset/crying and exhausted due to husband being hospitalized. PT Treatment/Interventions: Therapeutic exercise PT Plan: Progress as able     Problem List Patient Active Problem List  Diagnoses  . DM  . HYPERTENSION  . CHRONIC SYSTOLIC HEART FAILURE  . ESRD  . Muscle weakness (generalized)  . Difficulty in walking    PT - End of Session Equipment Utilized During Treatment: Gait belt Activity Tolerance: Patient limited by fatigue General Behavior During Session: Other (comment) (exhausted/emotional) Cognition: [facility] for tasks performed  Katrina Meyer 10/11/2010, 4:31 PM

## 2010-10-25 ENCOUNTER — Inpatient Hospital Stay (HOSPITAL_COMMUNITY): Admission: RE | Admit: 2010-10-25 | Payer: Medicare Other | Source: Ambulatory Visit | Admitting: Physical Therapy

## 2010-11-20 ENCOUNTER — Ambulatory Visit (HOSPITAL_COMMUNITY)
Admission: RE | Admit: 2010-11-20 | Discharge: 2010-11-20 | Disposition: A | Discharge status: Home / Self Care | Payer: Medicare Other | Source: Ambulatory Visit | Attending: Internal Medicine | Admitting: Internal Medicine

## 2010-11-20 DIAGNOSIS — M6281 Muscle weakness (generalized): Secondary | ICD-10-CM | POA: Insufficient documentation

## 2010-11-20 DIAGNOSIS — R262 Difficulty in walking, not elsewhere classified: Secondary | ICD-10-CM | POA: Insufficient documentation

## 2010-11-20 DIAGNOSIS — I10 Essential (primary) hypertension: Secondary | ICD-10-CM | POA: Insufficient documentation

## 2010-11-20 DIAGNOSIS — E119 Type 2 diabetes mellitus without complications: Secondary | ICD-10-CM | POA: Insufficient documentation

## 2010-11-20 DIAGNOSIS — IMO0001 Reserved for inherently not codable concepts without codable children: Secondary | ICD-10-CM | POA: Insufficient documentation

## 2010-11-20 NOTE — Patient Instructions (Addendum)
Hep

## 2010-11-20 NOTE — Progress Notes (Signed)
Physical Therapy RE- Evaluation  Patient Details  Name: Katrina Meyer MRN: [id number] Date of Birth: 11-12-1932  Today's Date: 11/20/2010 Time: 1191-4782 Time Calculation (min): 43 min Visit#: 6  of 16   Re-eval: 12/20/10    Past Medical History: No past medical history on file. Past Surgical History: No past surgical history on file.  Subjective Symptoms/Limitations Symptoms: God has been good to me I have no pain. How long can you sit comfortably?: no problem How long can you stand comfortably?: Pt needs mod assist to come to standing position.  The patient is able to stand 2 minutes without holding onto her walker before she has loss of balance.  Initial eval:  Pt needed to hold onto her walker to prevent falling. How long can you walk comfortably?: Patient is able to walk 12 minutes with her walker at initial evaluatin she was unable to walk more than 2 minutes. Pain Assessment Currently in Pain?: No/denies  Precautions/Restrictions  Precautions Precautions: Fall   Assessment RLE Strength:  Was 2-/5 for all except knee flexion 2/5 knee extension 3-/5 and ankle 3+/5.  NOW Right Hip Flexion: 3/5 Right Hip Extension: 2-/5 Right Hip ABduction: 3+/5 Right Hip ADduction: 3-/5 Right Knee Flexion: 3+/5 Right Knee Extension: 3+/5 Right Ankle Dorsiflexion: 4/5 Right Ankle Plantar Flexion: 4/5 LLE Strength:  Left Hip Flexion: 3/5 was 2- Left Hip Extension: 2-/5 was 2 Left Hip ABduction: 3-/5 was 2 Left Hip ADduction: 3+/5 was 2 Left Knee Flexion: 3+/5 was 2/5 Left Knee Extension: 4/5 was 3+/5 Left Ankle Dorsiflexion: 4/5 Left Ankle Plantar Flexion: 4/5  Exercise/Treatments Pt was able to walk for 12 min with 4-wheeled walker and supervision.  Able to stand without UE support for two minutes.    Physical Therapy Assessment and Plan PT Assessment and Plan Clinical Impression Statement: Patient has improved objectively; subjectively sitter states that it is easier to  care for patient.  Patient still lacking in strength and balance will continue to see patient 2x/week for 4 weeks to increase I in functional mobily; decrease risk of falls. Rehab Potential: Good Clinical Impairments Affecting Rehab Potential: decreased balance decreased strength Recommendation: continue treatment PT Frequency: Min 2X/week PT Duration: 4 weeks PT Treatment/Interventions: DME instruction;Gait training;Functional mobility training;Therapeutic exercise;Balance training;Neuromuscular re-education PT Plan: Continue to see work on standing balance activites which will incorporate strength, sit to stand to increase glut mm.  Complet TUG test next visit for objective data.    Goals Home Exercise Program PT Goal: Perform Home Exercise Program - Progress: Partly met (Pt sitter states she gets the exercises done 3x/wk) PT Short Term Goals PT Short Term Goal 1 - Progress: Partly met (doing exercises three times a week) PT Short Term Goal 2 - Progress: Met PT Short Term Goal 3 - Progress: Met PT Short Term Goal 4 - Progress: Not met PT Short Term Goal 5 - Progress: Met Additional PT Short Term Goals?: Yes PT Short Term Goal 6: New goal as of 10/16- Able to come sit to stand with min assist PT Short Term Goal 7: Able to reach 3-4 inches outside base of support without loss of balance. PT Long Term Goals Time to Complete Long Term Goals: 4 weeks PT Long Term Goal 1 - Progress: Not met PT Long Term Goal 2 - Progress: Met Long Term Goal 3 Progress: Met Long Term Goal 4 Progress: Met PT Long Term Goal 5: new as of 10/16- Strength to be increased by one grade Additional PT Long  Term Goals?: Yes PT Long Term Goal 6: as of 10/16- able to come sit to stand with supervise assit. PT Long Term Goal 7: Patient able to walk for 20 minutes without stopping  Problem List Patient Active Problem List  Diagnoses  . DM  . HYPERTENSION  . CHRONIC SYSTOLIC HEART FAILURE  . ESRD  . Muscle  weakness (generalized)  . Difficulty in walking    PT - End of Session Equipment Utilized During Treatment: Gait belt Activity Tolerance: Patient tolerated treatment well General Behavior During Session: [facility] for tasks performed Cognition: [facility] for tasks performed   RUSSELL,CINDY 11/20/2010, 3:22 PM  Physician Documentation Your signature is required to indicate approval of the treatment plan as stated above.  Please sign and either send electronically or make a copy of this report for your files and return this physician signed original.   Please mark one 1.__approve of plan  2. ___approve of plan with the following conditions.   ______________________________                                                          _____________________ Physician Signature                                                                                                             Date

## 2010-11-22 ENCOUNTER — Ambulatory Visit (HOSPITAL_COMMUNITY): Admission: RE | Admit: 2010-11-22 | Discharge: 2010-11-22 | Payer: Medicare Other | Source: Ambulatory Visit

## 2010-11-22 DIAGNOSIS — M6281 Muscle weakness (generalized): Secondary | ICD-10-CM

## 2010-11-22 DIAGNOSIS — R262 Difficulty in walking, not elsewhere classified: Secondary | ICD-10-CM

## 2010-11-22 NOTE — Progress Notes (Signed)
Physical Therapy Treatment Patient Details  Name: Katrina Meyer MRN: [id number] Date of Birth: 12/21/1932  Today's Date: 11/22/2010 Time: 2440-1027 Time Calculation (min): 43 min Visit#: 7  of 16   Re-eval: 12/20/10  Charge: gait x 19 min Physical performance testing with separate report (TUG) 15 min  Subjective: Symptoms/Limitations Symptoms: I am so tired today, no pain. Pain Assessment Currently in Pain?: No/denies  Objective: amb with 4 wheeled rolling walker  Mobility (including Balance)    Balance Balance Assessed: Yes Dynamic Standing Balance Dynamic Standing - Balance Support: Bilateral upper extremity supported Standardized Balance Assessment: Timed Up and Go Test Timed Up and Go Test TUG: Normal TUG Normal TUG (seconds): 64  (Pt required mod assistance to stand, performed TUG w walker)  Exercise/Treatments Balance Exercises Amb with rolling walker for 8'26", rest then 8\' 20" .  Amb outdoor at end of session to car in handicapped parking spot.   Stand without Upper Extremity Support: 2'  Physical Therapy Assessment and Plan PT Assessment and Plan Clinical Impression Statement: Pt with increased endurance as far as time for gait though very slow gait throughout session.  Pt continued to state she was tired throughout session but when asked if she needed to rest she would agree to go for another lap.  TUG completed with best time 64" (max of 3 attempts) with mod assistance to stand., high risk of falls.  Able to stand for 2' with no AD. PT Plan: Continue with current POC addressing balance, strength and increase functional mobility.    Goals    Problem List Patient Active Problem List  Diagnoses  . DM  . HYPERTENSION  . CHRONIC SYSTOLIC HEART FAILURE  . ESRD  . Muscle weakness (generalized)  . Difficulty in walking    PT - End of Session Equipment Utilized During Treatment: Gait belt;Other (comment) (4 wheeled walker) Activity Tolerance: Patient  tolerated treatment well;Patient limited by fatigue General Behavior During Session: [facility] for tasks performed Cognition: [facility] for tasks performed  Juel Burrow 11/22/2010, 6:44 PM

## 2010-11-27 ENCOUNTER — Ambulatory Visit (HOSPITAL_COMMUNITY)
Admission: RE | Admit: 2010-11-27 | Discharge: 2010-11-27 | Disposition: A | Discharge status: Home / Self Care | Payer: Medicare Other | Source: Ambulatory Visit | Attending: Internal Medicine | Admitting: Internal Medicine

## 2010-11-27 NOTE — Progress Notes (Signed)
Physical Therapy Treatment Patient Details  Name: Katrina Meyer MRN: [id number] Date of Birth: March 10, 1932  Today's Date: 11/27/2010 Time: 9147-8295 Time Calculation (min): 36 min Visit#: 8  of 16   Re-eval: 12/20/10 Charges: Therex x 23' Gait x 12'  Subjective: Symptoms/Limitations Symptoms: I'm just so tired. Pain Assessment Currently in Pain?: No/denies   Exercise/Treatments Hip Exercises Hip Extension: 10 reps;Both;Standing  Gait Training: Gait training x 12' w/1 rest break Gait from rehab department to car in handicap space with contact guard assist.  Balance Exercises Heel Raises: 10 reps (w/2 finger assist)  Sit to stand x 3 w/ mod assist  Physical Therapy Assessment and Plan PT Assessment and Plan Clinical Impression Statement: Pt requires constant motivation to complete tasks. PT able to walk from department out to car with contact guard assist. Pt reported that she was tired throughout session yet completed squats and standing hip ext without difficulty. Pt requires mod assist with sit to stand and multimodal cueing for proper technique. PT Treatment/Interventions: Therapeutic exercise;Gait training PT Plan: Continue to progress per PT POC.     Problem List Patient Active Problem List  Diagnoses  . DM  . HYPERTENSION  . CHRONIC SYSTOLIC HEART FAILURE  . ESRD  . Muscle weakness (generalized)  . Difficulty in walking    PT - End of Session Activity Tolerance: Patient tolerated treatment well General Behavior During Session: [facility] for tasks performed Cognition: [facility] for tasks performed  Antonieta Iba 11/27/2010, 3:24 PM

## 2010-11-29 ENCOUNTER — Ambulatory Visit (HOSPITAL_COMMUNITY)
Admission: RE | Admit: 2010-11-29 | Discharge: 2010-11-29 | Disposition: A | Discharge status: Home / Self Care | Payer: Medicare Other | Source: Ambulatory Visit | Attending: Internal Medicine | Admitting: Internal Medicine

## 2010-11-29 DIAGNOSIS — M6281 Muscle weakness (generalized): Secondary | ICD-10-CM

## 2010-11-29 DIAGNOSIS — R262 Difficulty in walking, not elsewhere classified: Secondary | ICD-10-CM

## 2010-11-29 NOTE — Progress Notes (Signed)
Physical Therapy Treatment Patient Details  Name: Katrina Meyer MRN: [id number] Date of Birth: 04/19/1932  Today's Date: 11/29/2010 Time: 4098-1191 Time Calculation (min): 37 min Visit#: 9  of 16   Re-eval: 12/20/10  Charge: therex: 15 min Gait 20 min  Subjective:  I'm just so tired today  Objective: amb with 4 wheeled RW  Exercise/Treatments Gait Training: Gait training total 20 min with 3 Rest breaks  Standing Heel Raises: 10 reps Functional Squat: 10 reps Gait Training: Gait training total 20 min with 3 Rest breaks Other Standing Knee Exercises: Marching alternating 10 reps Other Standing Knee Exercises: glut sets Seated Other Seated Knee Exercises: Sit to stand x 5 with mod A  Physical Therapy Assessment and Plan PT Assessment and Plan Clinical Impression Statement: Pt requires constant motivation to complete tasks but performed with increased endurace following vc/ asking if she wants to sit or continue to walk she would agree to continue walking.  Able to complete 3 sets of therex activities 10 reps with no rest breaks required.  Pt continued to report that she was tired through session.  Still requires mod assist with sit to stand.  Care taker asked what they could do to assist and explained that she should do all she is capable of independently without doing for her (ex: when sitting in car she tells caregiver to put her feet in car for her, when able to complete standing marching during session and to put on seat belt for her.  Pt explained to complete all she is able to do independently without requesting others to do for her for improved functional abilites)  PT Plan: Continue to progress per PT POC    Goals    Problem List Patient Active Problem List  Diagnoses  . DM  . HYPERTENSION  . CHRONIC SYSTOLIC HEART FAILURE  . ESRD  . Muscle weakness (generalized)  . Difficulty in walking    PT - End of Session Equipment Utilized During Treatment: Gait  belt;Other (comment) (4 wheeled walker) Activity Tolerance: Patient tolerated treatment well;Patient limited by fatigue General Behavior During Session: [facility] for tasks performed Cognition: [facility] for tasks performed  Juel Burrow 11/29/2010, 7:11 PM

## 2010-12-04 ENCOUNTER — Ambulatory Visit (HOSPITAL_COMMUNITY)
Admission: RE | Admit: 2010-12-04 | Discharge: 2010-12-04 | Disposition: A | Discharge status: Home / Self Care | Payer: Medicare Other | Source: Ambulatory Visit | Attending: Physical Therapy | Admitting: Physical Therapy

## 2010-12-04 NOTE — Progress Notes (Signed)
Physical Therapy Treatment Patient Details  Name: Katrina Meyer MRN: [id number] Date of Birth: Jul 05, 1932  Today's Date: 12/04/2010 Time: 4098-1191 Time Calculation (min): 31 min Visit#: 10  of 16   Re-eval: 12/20/10 Charges: Therex x 15' Gait x 15'  Subjective: Symptoms/Limitations Symptoms: Pt reports she is tired. Pain Assessment Currently in Pain?: No/denies   Exercise/Treatments Standing Heel Raises: 15 reps Functional Squat: 10 reps Gait Training: gt training around dept and out to parking lot Seated Other Seated Knee Exercises: sit to stand from raised surface CGA  x 3  Physical Therapy Assessment and Plan PT Assessment and Plan Clinical Impression Statement: Pt need less motivation to complete activities this tx. Pt with increased gait speed and endurance this tx. Pt able to go from sit to stand with contact guard assist from raised surface. PT Treatment/Interventions: Therapeutic exercise;Gait training PT Plan: Continue to progress per PT POC.     Problem List Patient Active Problem List  Diagnoses  . DM  . HYPERTENSION  . CHRONIC SYSTOLIC HEART FAILURE  . ESRD  . Muscle weakness (generalized)  . Difficulty in walking    PT - End of Session Equipment Utilized During Treatment: Gait belt;Other (comment) (four wheeled walker) Activity Tolerance: Patient tolerated treatment well General Behavior During Session: [facility] for tasks performed Cognition: [facility] for tasks performed  Antonieta Iba 12/04/2010, 3:21 PM

## 2010-12-06 ENCOUNTER — Ambulatory Visit (HOSPITAL_COMMUNITY): Payer: Medicare Other | Admitting: Physical Therapy

## 2010-12-18 ENCOUNTER — Ambulatory Visit (HOSPITAL_COMMUNITY)
Admission: RE | Admit: 2010-12-18 | Discharge: 2010-12-18 | Disposition: A | Discharge status: Home / Self Care | Payer: Medicare Other | Source: Ambulatory Visit | Attending: Internal Medicine | Admitting: Internal Medicine

## 2010-12-18 DIAGNOSIS — M6281 Muscle weakness (generalized): Secondary | ICD-10-CM | POA: Insufficient documentation

## 2010-12-18 DIAGNOSIS — IMO0001 Reserved for inherently not codable concepts without codable children: Secondary | ICD-10-CM | POA: Insufficient documentation

## 2010-12-18 DIAGNOSIS — R262 Difficulty in walking, not elsewhere classified: Secondary | ICD-10-CM | POA: Insufficient documentation

## 2010-12-18 DIAGNOSIS — I10 Essential (primary) hypertension: Secondary | ICD-10-CM | POA: Insufficient documentation

## 2010-12-18 DIAGNOSIS — E119 Type 2 diabetes mellitus without complications: Secondary | ICD-10-CM | POA: Insufficient documentation

## 2010-12-18 NOTE — Progress Notes (Signed)
Physical Therapy Treatment Patient Details  Name: Katrina Meyer MRN: [id number] Date of Birth: 1932-03-14  Today's Date: 12/18/2010 Time: 4098-1191 Time Calculation (min): 47 min Visit#: 11  of 16   Re-eval: 12/20/10 Charges: Therex x 30' Gait x 12'  Subjective: Symptoms/Limitations Symptoms: Pt conintues to say she is tired. Pain Assessment Currently in Pain?: No/denies   Exercise/Treatments Standing Heel Raises: 15 reps Functional Squat: 10 reps Gait Training: out to parking lot Other Standing Knee Exercises: Marching alternating 10 reps Other Standing Knee Exercises: hip ext and abd x 10 each Seated Other Seated Knee Exercises: sit to stand w/mod assist x 4  Physical Therapy Assessment and Plan PT Assessment and Plan Clinical Impression Statement: Pt displays increased insecurity this session. Pt said multiple time throughout session : "I'm going to fall.". Pt requires mod assist and multimodal cueing to perform sit to stand. Pt  requires supervision with gt. PT Treatment/Interventions: Therapeutic exercise;Gait training PT Plan: Continue to progress per PT POC.     Problem List Patient Active Problem List  Diagnoses  . DM  . HYPERTENSION  . CHRONIC SYSTOLIC HEART FAILURE  . ESRD  . Muscle weakness (generalized)  . Difficulty in walking    PT - End of Session Activity Tolerance: Patient tolerated treatment well General Behavior During Session: [facility] for tasks performed Cognition: [facility] for tasks performed  Antonieta Iba 12/18/2010, 5:11 PM

## 2010-12-24 ENCOUNTER — Ambulatory Visit (HOSPITAL_COMMUNITY): Payer: Medicare Other | Admitting: Physical Therapy

## 2010-12-25 ENCOUNTER — Ambulatory Visit (HOSPITAL_COMMUNITY): Payer: Medicare Other | Admitting: *Deleted

## 2011-01-01 ENCOUNTER — Ambulatory Visit (INDEPENDENT_AMBULATORY_CARE_PROVIDER_SITE_OTHER): Payer: Medicare Other | Admitting: *Deleted

## 2011-01-01 ENCOUNTER — Other Ambulatory Visit (INDEPENDENT_AMBULATORY_CARE_PROVIDER_SITE_OTHER): Payer: Medicare Other | Admitting: *Deleted

## 2011-01-01 DIAGNOSIS — Z48812 Encounter for surgical aftercare following surgery on the circulatory system: Secondary | ICD-10-CM

## 2011-01-01 DIAGNOSIS — I739 Peripheral vascular disease, unspecified: Secondary | ICD-10-CM

## 2011-01-01 NOTE — Procedures (Unsigned)
LOWER EXTREMITY ARTERIAL DUPLEX  INDICATION:  Follow up right popliteal stent.  HISTORY: Diabetes:  Yes. Cardiac:  No. Hypertension:  Yes. Smoking:  No. Previous Surgery:  Right popliteal stent 06/27/10.  SINGLE LEVEL ARTERIAL EXAM                         RIGHT                LEFT Brachial:               170                  AV fistula Anterior tibial:        [place]                   [place] Posterior tibial:       [place]                   [place] Peroneal:               [place]                    Toe/Brachial Index:     0.41                 0.52  PREVIOUS TBI:  Date:  07/12/10  Right:  NA  Left:  0.43   LOWER EXTREMITY ARTERIAL DUPLEX EXAM  DUPLEX: 1. Technically difficult study due to vessel calcification. 2. Right lower extremity arteries were evaluated in areas between     calcific shadow.  No focal stenosis could be identified; however,     the vessels were not visualized in their entirety. 3. Seemingly patent right popliteal stent with monophasic inflow and     outflow.  No signal could be visualized within the stent itself,     possibly due to calcific shadow.    IMPRESSION:  Patent right lower extremity arteries, as described above.        ___________________________________________ Quita Skye. Hart Rochester, M.D.  LT/MEDQ  D:  01/01/2011  T:  01/01/2011  Job:  [id number]

## 2011-01-14 ENCOUNTER — Other Ambulatory Visit: Payer: Self-pay | Admitting: Vascular Surgery

## 2011-01-14 DIAGNOSIS — I739 Peripheral vascular disease, unspecified: Secondary | ICD-10-CM

## 2011-01-14 DIAGNOSIS — Z48812 Encounter for surgical aftercare following surgery on the circulatory system: Secondary | ICD-10-CM

## 2011-01-17 ENCOUNTER — Encounter: Payer: Self-pay | Admitting: Vascular Surgery

## 2011-03-29 ENCOUNTER — Encounter: Payer: Self-pay | Admitting: Vascular Surgery

## 2011-04-01 ENCOUNTER — Encounter: Payer: Self-pay | Admitting: Vascular Surgery

## 2011-04-02 ENCOUNTER — Encounter: Payer: Self-pay | Admitting: Vascular Surgery

## 2011-04-02 ENCOUNTER — Encounter (INDEPENDENT_AMBULATORY_CARE_PROVIDER_SITE_OTHER): Payer: Medicare Other | Admitting: *Deleted

## 2011-04-02 ENCOUNTER — Ambulatory Visit (INDEPENDENT_AMBULATORY_CARE_PROVIDER_SITE_OTHER): Payer: Medicare Other | Admitting: Vascular Surgery

## 2011-04-02 VITALS — BP 137/54 | HR 75 | Resp 16 | Ht 67.0 in | Wt 190.0 lb

## 2011-04-02 DIAGNOSIS — I739 Peripheral vascular disease, unspecified: Secondary | ICD-10-CM

## 2011-04-02 DIAGNOSIS — Z48812 Encounter for surgical aftercare following surgery on the circulatory system: Secondary | ICD-10-CM

## 2011-04-02 NOTE — Progress Notes (Signed)
Subjective:     Patient ID: Katrina Meyer, female   DOB: [birth year], 78 y.o.   MRN: [id number]  HPI this 78 year old female returns today accompanied by her daughters. She had a stent placed in her right popliteal artery in May of 2012 by Dr. Darrick Penna. She had ulcerations on 3 toes of the right foot which has subsequently healed. She does not ambulate independently. She lives at home it requires a lot of care. She did go to the wound center today for a blistered area on the tip of her left second toe caused by a shoe.  Past Medical History  Diagnosis Date  . Hyperlipidemia   . Hypertension   . Diabetes mellitus   . End stage renal disease on dialysis   . Stroke   . DVT (deep venous thrombosis)     History  Substance Use Topics  . Smoking status: Never Smoker   . Smokeless tobacco: Not on file  . Alcohol Use: No    Family History  Problem Relation Age of Onset  . Stroke Mother   . Diabetes Mother   . Cancer Mother   . Hypertension Mother   . Coronary artery disease Father   . Diabetes Sister     Not on File  No current outpatient prescriptions on file.  BP 137/54  Pulse 75  Resp 16  Ht 5\' 7"  (1.702 m)  Wt 190 lb (86.183 kg)  BMI 29.76 kg/m2  SpO2 100%  Body mass index is 29.76 kg/(m^2).         Review of Systems     Objective:   Physical Exam pressure 137/54 heart rate 75 respirations 16 General she is an elderly chronically ill-appearing female who does not engage in conversation in no apparent distress Chest no rhonchi or wheezing Cardiovascular regular and no murmurs carotid pulses 3+ no audible bruits Lower extremity exam reveals 3+ femoral pulses bilaterally. Right leg is free of any ulcerations. The fifth and first toes now look good with no infection or ulceration noted. Left foot has a very superficial ulceration adjacent to the nail where the blister was located on the second toe. There is no evidence of infection  Today I ordered a duplex  scan of her right leg which reveals a stent in her popliteal artery to be patent but difficult to visualize. ABI on the right is 0.27 and left is 0.35 with monophasic flow.     Assessment:     Severe vascular occlusive disease with severe tibial disease. No active ulcerations in the right leg small blistered area in the left foot-being treated in wound center    Plan:     Stent and right popliteal artery appears patent with no recurrence of ulcerations. We'll return in 6 months for repeat ABIs and duplex scan right leg unless patient develops recurrent ulcers of right leg in the interim

## 2011-04-02 NOTE — Procedures (Unsigned)
LOWER EXTREMITY ARTERIAL DUPLEX  INDICATION:  Followup right popliteal stent placed 06/27/2010.  HISTORY: Diabetes:  Yes Cardiac: Hypertension:  Yes Smoking:  No Previous Surgery:  Right popliteal stent  SINGLE LEVEL ARTERIAL EXAM                         RIGHT                LEFT Brachial: Anterior tibial: Posterior tibial: Peroneal: Toe /Brachial Index:      .27                  .35   Toe brachial index 01/01/2011:  Right = 0.41.  Left = 0.52.   LOWER EXTREMITY ARTERIAL DUPLEX EXAM   DUPLEX:  Technically difficult study due to calcific shadow of native vessels and the stent.  Identification of the stent is difficult; however, it appears patent with monophasic signals in the distal popliteal artery.  Collaterals are visualized as well as turbulent waveforms in the area of the stent; cannot rule out stenosis.    IMPRESSION: 1. Apparently patent right popliteal stent, with difficult     visualization and identification due to calcific shadow. 2. Please see diagram for details.  ___________________________________________ Quita Skye Hart Rochester, M.D.  LT/MEDQ  D:  04/02/2011  T:  04/02/2011  Job:  [id number]

## 2011-04-16 ENCOUNTER — Other Ambulatory Visit: Payer: Self-pay | Admitting: *Deleted

## 2011-04-16 DIAGNOSIS — I739 Peripheral vascular disease, unspecified: Secondary | ICD-10-CM

## 2011-08-28 ENCOUNTER — Telehealth: Payer: Self-pay

## 2011-08-28 DIAGNOSIS — N186 End stage renal disease: Secondary | ICD-10-CM

## 2011-08-28 NOTE — Telephone Encounter (Signed)
Rec'd call from Olegario Messier, nurse at dialysis clinic in [place], requesting a vein mapping for pt.  States pt. has had 2 access that are both clotted; the first AVF was placed in 2005, and the 2nd AVF was placed in 2010.  Stated that "pt. normally goes to [facility] for her access care, and is not able to get appt. for vein mapping in near future".   Advised nurse that we will schedule pt. for appts. as requested.

## 2011-09-05 HISTORY — PX: AV FISTULA REPAIR: SHX563

## 2011-09-12 ENCOUNTER — Telehealth: Payer: Self-pay | Admitting: Vascular Surgery

## 2011-09-12 NOTE — Telephone Encounter (Signed)
PATIENT'S CAREGIVER CALLED TO SAY THAT [facility] HAD SENT THE PT. TO [facility] FOR VEIN MAPPING 09-11-11 AND THEY SCHEDULED HER FOR SURGERY ON 10-03-11.  SHE STATED THAT SHE WAS CONFUSED BECAUSE SHE ALSO  HAD AN APPT. HERE ON 10-03-11 FOR VEIN MAPPING AND OFFICE VISIT  WITH DR. FIELDS.  I CALLED DAVITA AND THEY ADMITTED THAT TWO DIFFERENT PEOPLE WERE WORKING ON SCHEDULING HER APPT.S AND THE PT. HAD BEEN SCHEDULED IN BOTH PLACES FOR THE SAME THING.  THEY TOLD ME BECAUSE SHE HAD ALREADY HAD THE VEIN MAPPING DONE AT [facility] I SHOULD CANCEL THE APPT. HERE.

## 2011-09-26 ENCOUNTER — Ambulatory Visit: Payer: Medicare Other | Admitting: Vascular Surgery

## 2011-10-01 ENCOUNTER — Ambulatory Visit: Payer: Medicare Other | Admitting: Neurosurgery

## 2011-10-03 ENCOUNTER — Ambulatory Visit: Payer: Medicare Other | Admitting: Vascular Surgery

## 2011-10-04 ENCOUNTER — Encounter: Payer: Self-pay | Admitting: Neurosurgery

## 2011-10-08 ENCOUNTER — Ambulatory Visit: Payer: Medicare Other | Admitting: Neurosurgery

## 2011-10-28 ENCOUNTER — Encounter: Payer: Self-pay | Admitting: Neurosurgery

## 2011-10-28 ENCOUNTER — Emergency Department (HOSPITAL_COMMUNITY)
Admission: EM | Admit: 2011-10-28 | Discharge: 2011-10-29 | Disposition: A | Discharge status: Home / Self Care | Payer: Medicare Other | Attending: Emergency Medicine | Admitting: Emergency Medicine

## 2011-10-28 ENCOUNTER — Encounter (HOSPITAL_COMMUNITY): Payer: Self-pay | Admitting: *Deleted

## 2011-10-28 DIAGNOSIS — Z794 Long term (current) use of insulin: Secondary | ICD-10-CM | POA: Insufficient documentation

## 2011-10-28 DIAGNOSIS — Z86718 Personal history of other venous thrombosis and embolism: Secondary | ICD-10-CM | POA: Insufficient documentation

## 2011-10-28 DIAGNOSIS — F0390 Unspecified dementia without behavioral disturbance: Secondary | ICD-10-CM | POA: Insufficient documentation

## 2011-10-28 DIAGNOSIS — N186 End stage renal disease: Secondary | ICD-10-CM | POA: Insufficient documentation

## 2011-10-28 DIAGNOSIS — I120 Hypertensive chronic kidney disease with stage 5 chronic kidney disease or end stage renal disease: Secondary | ICD-10-CM | POA: Insufficient documentation

## 2011-10-28 DIAGNOSIS — M79609 Pain in unspecified limb: Secondary | ICD-10-CM | POA: Insufficient documentation

## 2011-10-28 DIAGNOSIS — M79603 Pain in arm, unspecified: Secondary | ICD-10-CM

## 2011-10-28 DIAGNOSIS — E119 Type 2 diabetes mellitus without complications: Secondary | ICD-10-CM | POA: Insufficient documentation

## 2011-10-28 DIAGNOSIS — Z8673 Personal history of transient ischemic attack (TIA), and cerebral infarction without residual deficits: Secondary | ICD-10-CM | POA: Insufficient documentation

## 2011-10-28 HISTORY — DX: Unspecified dementia, unspecified severity, without behavioral disturbance, psychotic disturbance, mood disturbance, and anxiety: F03.90

## 2011-10-28 MED ORDER — OXYCODONE-ACETAMINOPHEN 5-325 MG PO TABS
1.0000 | ORAL_TABLET | Freq: Once | ORAL | Status: AC
  Administered 2011-10-29: 1 via ORAL
  Filled 2011-10-28: qty 1

## 2011-10-28 NOTE — ED Provider Notes (Signed)
History   This chart was scribed for [employer]. Colon Branch, MD by Charolett Bumpers . The patient was seen in room APA04/APA04. Patient's care was started at 2314.    CSN: [id number]  Arrival date & time 10/28/11  2102   First MD Initiated Contact with Patient 10/28/11 2314      Chief Complaint  Patient presents with  . Arm Swelling    (Consider location/radiation/quality/duration/timing/severity/associated sxs/prior treatment) HPI Chelse Janaria Meyer is a 78 y.o. female who presents to the Emergency Department complaining of constant, worsening left arm swelling with associated redness and pain. She has a AV shunt graft that was placed 6 weeks ago for dialysis. Pt has steri strips in place at the graft site. She denies any drainage. Pt states that she has been on dialysis for the past 20 years. Pt states that she has been taking pain medication. She denies any h/o CVA. She receives dialysis Mon/Wed/Fri.   PCP: Sherril Croon in [place]   Past Medical History  Diagnosis Date  . Hyperlipidemia   . Hypertension   . Diabetes mellitus   . DVT (deep venous thrombosis)   . End stage renal disease on dialysis   . Stroke   . Dementia     Past Surgical History  Procedure Date  . Dg av dialysis graft declot or     Family History  Problem Relation Age of Onset  . Stroke Mother   . Diabetes Mother   . Cancer Mother   . Hypertension Mother   . Coronary artery disease Father   . Diabetes Sister     History  Substance Use Topics  . Smoking status: Never Smoker   . Smokeless tobacco: Not on file  . Alcohol Use: No    OB History    Grav Para Term Preterm Abortions TAB SAB Ect Mult Living                  Review of Systems A complete 10 system review of systems was obtained and all systems are negative except as noted in the HPI and PMH.   Allergies  Review of patient's allergies indicates no known allergies.  Home Medications   Current Outpatient Rx  Name Route Sig Dispense  Refill  . AMLODIPINE BESYLATE 5 MG PO TABS Oral Take 1 tablet by mouth Daily.    . ATORVASTATIN CALCIUM 10 MG PO TABS Oral Take 1 tablet by mouth Daily.    Marland Kitchen CARVEDILOL 6.25 MG PO TABS Oral Take 1 tablet by mouth Daily.    . DONEPEZIL HCL 10 MG PO TABS Oral Take 1 tablet by mouth Daily.    Marland Kitchen FEXOFENADINE HCL 180 MG PO TABS Oral Take 180 mg by mouth daily.    Marland Kitchen FLUOXETINE HCL 40 MG PO CAPS Oral Take 1 capsule by mouth Daily.    Marland Kitchen FOSRENOL 500 MG PO CHEW Oral Chew 1 tablet by mouth 3 (three) times daily with meals. Also takes with snacks    . GLIPIZIDE XL 10 MG PO TB24 Oral Take 1 tablet by mouth Daily.    . INSULIN GLARGINE 100 UNIT/ML [place] SOLN Subcutaneous Inject 4 Units into the skin at bedtime.     . INSULIN LISPRO (HUMAN) 100 UNIT/ML [place] SOLN Subcutaneous Inject into the skin 3 (three) times daily before meals. As directed per sliding scale    . LEVOTHYROXINE SODIUM 25 MCG PO TABS Oral Take 25 mcg by mouth every morning.    Marland Kitchen LOPERAMIDE HCL 2  MG PO CAPS Oral Take 2 mg by mouth daily as needed.    Marland Kitchen RENA-VITE PO TABS Oral Take 1 tablet by mouth daily.    . OXYCODONE-ACETAMINOPHEN 5-325 MG PO TABS Oral Take 1 tablet by mouth every 6 (six) hours as needed. For pain    . RAMIPRIL 5 MG PO CAPS Oral Take 5 mg by mouth 2 (two) times daily.    Marland Kitchen SEVELAMER CARBONATE 800 MG PO TABS Oral Take 800-1,600 mg by mouth 3 (three) times daily with meals. Take two with meals and one with snacks    . CARBIDOPA-LEVODOPA 25-100 MG PO TABS        BP 135/50  Pulse 72  Temp 98.5 F (36.9 C) (Oral)  Resp 20  SpO2 100%  Physical Exam  Nursing note and vitals reviewed. Constitutional: She is oriented to person, place, and time. She appears well-developed and well-nourished. No distress.  HENT:  Head: Normocephalic and atraumatic.  Eyes: EOM are normal. Pupils are equal, round, and reactive to light.  Neck: Normal range of motion. Neck supple. No tracheal deviation present.  Cardiovascular: Normal rate and  normal heart sounds.  An irregular rhythm present.  No murmur heard.      AV shunt in left arm with a developing bruit. Recruitment site in left upper arm that is tender to touch.   Pulmonary/Chest: Effort normal and breath sounds normal. No respiratory distress. She has no wheezes.  Abdominal: Soft. Bowel sounds are normal. She exhibits no distension. There is no tenderness.  Musculoskeletal: Normal range of motion. She exhibits no edema.       Swelling from left wrist to left axillary.   Neurological: She is oriented to person, place, and time.       Pt processes slow, but is oriented.   Skin: Skin is warm and dry.  Psychiatric: She has a normal mood and affect. Her behavior is normal.    ED Course  Procedures (including critical care time)  DIAGNOSTIC STUDIES: Oxygen Saturation is 100% on room air, normal by my interpretation.    COORDINATION OF CARE:  23:40-Discussed planned course of treatment with the patient including an x-ray and pain medication, who is agreeable at this time.    Dg Forearm Left  10/29/2011  *RADIOLOGY REPORT*  Clinical Data: Swelling and bruising.  LEFT FOREARM - 2 VIEW  Comparison: Left wrist 07/21/2011.  Findings: Healing distal radius fracture is present.  Diffuse osteopenia.  Diabetic small vessel atherosclerotic calcification. No acute fracture is identified.  Dystrophic calcifications are present in the soft tissues of the forearm.  IMPRESSION:  1.  Healing distal radius fracture. 2.  Swelling of the forearm. 3.  No acute osseous abnormality.   Original Report Authenticated By: Andreas Newport, M.D.      No diagnosis found.    MDM  ESRD patient with h/o wrist fracture here with pain to left forearm. Maturing AVG in left forearm with beginning bruit, weak thrill. Distal radial aspect of wrist with bruising and tenderness with palpation. Xrays show a healing non displaced radius fracture (identified 07/2011). Dx testing d/w pt and daughter.  Questions  answered.  Verb understanding, agreeable to d/c home with outpt f/u.Pt stable in ED with no significant deterioration in condition.The patient appears reasonably screened and/or stabilized for discharge and I doubt any other medical condition or other [facility] [place] requiring further screening, evaluation, or treatment in the ED at this time prior to discharge.  I personally performed the services described in  this documentation, which was scribed in my presence. The recorded information has been reviewed and considered.   MDM Reviewed: nursing note, vitals and previous chart Interpretation: x-ray            Nicoletta Dress. Colon Branch, MD 10/29/11 [phone]

## 2011-10-28 NOTE — ED Notes (Signed)
Patient waiting for MD evaluation.

## 2011-10-28 NOTE — ED Notes (Signed)
Left arm swollen and red; no drainage noted at incision sites.  Steri-strips are in place from procedure.

## 2011-10-28 NOTE — ED Notes (Addendum)
Lt arm swollen and red. Caregiver noticed it tonight, tender to touch, Had graft placement 6 weeks ago. Clammy today .steri strips in place at graft site.

## 2011-10-29 ENCOUNTER — Ambulatory Visit (INDEPENDENT_AMBULATORY_CARE_PROVIDER_SITE_OTHER): Payer: Medicare Other | Admitting: *Deleted

## 2011-10-29 ENCOUNTER — Ambulatory Visit (INDEPENDENT_AMBULATORY_CARE_PROVIDER_SITE_OTHER): Payer: Medicare Other | Admitting: Neurosurgery

## 2011-10-29 ENCOUNTER — Encounter (INDEPENDENT_AMBULATORY_CARE_PROVIDER_SITE_OTHER): Payer: Medicare Other | Admitting: *Deleted

## 2011-10-29 ENCOUNTER — Encounter: Payer: Self-pay | Admitting: Neurosurgery

## 2011-10-29 ENCOUNTER — Emergency Department (HOSPITAL_COMMUNITY): Payer: Medicare Other

## 2011-10-29 VITALS — BP 190/80 | HR 70 | Resp 16 | Ht 67.0 in | Wt 175.0 lb

## 2011-10-29 DIAGNOSIS — Z48812 Encounter for surgical aftercare following surgery on the circulatory system: Secondary | ICD-10-CM

## 2011-10-29 DIAGNOSIS — I739 Peripheral vascular disease, unspecified: Secondary | ICD-10-CM

## 2011-10-29 NOTE — Progress Notes (Signed)
[facility] [place] [facility] Office Note  CC: PD surveillance Referring Physician: Hart Rochester  History of Present Illness: 78 year old female patient of Dr. Hart Rochester who is status post a right popliteal stent may of 2012 by Dr. Darrick Penna. The patient is seen today with her daughter who states the patient does not have lower extremity pain. She has no open ulcerations on either side that she does have one "sore spot" on her left fifth digit but it is not open and draining. The patient ambulates very slowly, is a fall risk and uses a walker.  Past Medical History  Diagnosis Date  . Hyperlipidemia   . Hypertension   . Diabetes mellitus   . DVT (deep venous thrombosis)   . End stage renal disease on dialysis   . Stroke   . Dementia     ROS: [x]  Positive   [ ]  Denies    General: [ ]  Weight loss, [ ]  Fever, [ ]  chills Neurologic: [ ]  Dizziness, [ ]  Blackouts, [ ]  Seizure [ ]  Stroke, [ ]  "Mini stroke", [ ]  Slurred speech, [ ]  Temporary blindness; [ ]  weakness in arms or legs, [ ]  Hoarseness Cardiac: [ ]  Chest pain/pressure, [ ]  Shortness of breath at rest [ ]  Shortness of breath with exertion, [ ]  Atrial fibrillation or irregular heartbeat Vascular: [ ]  Pain in legs with walking, [ ]  Pain in legs at rest, [ ]  Pain in legs at night,  [ ]  Non-healing ulcer, [ ]  Blood clot in vein/DVT,   Pulmonary: [ ]  Home oxygen, [ ]  Productive cough, [ ]  Coughing up blood, [ ]  Asthma,  [ ]  Wheezing Musculoskeletal:  [ ]  Arthritis, [ ]  Low back pain, [ ]  Joint pain Hematologic: [ ]  Easy Bruising, [ ]  Anemia; [ ]  Hepatitis Gastrointestinal: [ ]  Blood in stool, [ ]  Gastroesophageal Reflux/heartburn, [ ]  Trouble swallowing Urinary: [ ]  chronic Kidney disease, [ ]  on HD - [ ]  MWF or [ ]  TTHS, [ ]  Burning with urination, [ ]  Difficulty urinating Skin: [ ]  Rashes, [ ]  Wounds Psychological: [ ]  Anxiety, [ ]  Depression   Social History History  Substance Use Topics  . Smoking status: Never Smoker    . Smokeless tobacco: Not on file  . Alcohol Use: No    Family History Family History  Problem Relation Age of Onset  . Stroke Mother   . Diabetes Mother   . Cancer Mother   . Hypertension Mother   . Coronary artery disease Father   . Diabetes Sister     No Known Allergies  Current Outpatient Prescriptions  Medication Sig Dispense Refill  . amLODipine (NORVASC) 5 MG tablet Take 1 tablet by mouth Daily.      Marland Kitchen atorvastatin (LIPITOR) 10 MG tablet Take 1 tablet by mouth Daily.      . carbidopa-levodopa (SINEMET IR) 25-100 MG per tablet       . carvedilol (COREG) 6.25 MG tablet Take 1 tablet by mouth Daily.      Marland Kitchen donepezil (ARICEPT) 10 MG tablet Take 1 tablet by mouth Daily.      . fexofenadine (ALLEGRA) 180 MG tablet Take 180 mg by mouth daily.      Marland Kitchen FLUoxetine (PROZAC) 40 MG capsule Take 1 capsule by mouth Daily.      Marland Kitchen FOSRENOL 500 MG chewable tablet Chew 1 tablet by mouth 3 (three) times daily with meals. Also takes with snacks      . GLIPIZIDE XL  10 MG 24 hr tablet Take 1 tablet by mouth Daily.      . insulin glargine (LANTUS) 100 UNIT/ML injection Inject 4 Units into the skin at bedtime.       . insulin lispro (HUMALOG) 100 UNIT/ML injection Inject into the skin 3 (three) times daily before meals. As directed per sliding scale      . levothyroxine (SYNTHROID, LEVOTHROID) 25 MCG tablet Take 25 mcg by mouth every morning.      . loperamide (IMODIUM) 2 MG capsule Take 2 mg by mouth daily as needed.      . multivitamin (RENA-VIT) TABS tablet Take 1 tablet by mouth daily.      Marland Kitchen oxyCODONE-acetaminophen (PERCOCET/ROXICET) 5-325 MG per tablet Take 1 tablet by mouth every 6 (six) hours as needed. For pain      . ramipril (ALTACE) 5 MG capsule Take 5 mg by mouth 2 (two) times daily.      . sevelamer (RENVELA) 800 MG tablet Take 800-1,600 mg by mouth 3 (three) times daily with meals. Take two with meals and one with snacks       No current facility-administered medications for this  visit.   Facility-Administered Medications Ordered in Other Visits  Medication Dose Route Frequency Provider Last Rate Last Dose  . oxyCODONE-acetaminophen (PERCOCET/ROXICET) 5-325 MG per tablet 1 tablet  1 tablet Oral Once [employer]. Colon Branch, MD   1 tablet at 10/29/11 0005    Physical Examination  There were no vitals filed for this visit.  There is no height or weight on file to calculate BMI.  General:  WDWN in NAD Gait: Normal HEENT: WNL Eyes: Pupils equal Pulmonary: normal non-labored breathing , without Rales, rhonchi,  wheezing Cardiac: RRR, without  Murmurs, rubs or gallops; No carotid bruits Abdomen: soft, NT, no masses Skin: no rashes, ulcers noted Vascular Exam/Pulses: Lower extremity pulses are not palpated   Extremities without ischemic changes, no Gangrene , no cellulitis; no open wounds;  Musculoskeletal: no muscle wasting or atrophy  Neurologic: A&O X 3; Appropriate Affect ; SENSATION: normal; MOTOR FUNCTION:  moving all extremities equally. Speech is fluent/normal  Non-Invasive Vascular Imaging: ABIs are not calculated due to calcified arteries bilaterally, toe pressures today are 0.45 on the right 0.47 on the left she does have biphasic waveforms on the right, there is no elevation in velocities throughout the skin or the right lower extremity  ASSESSMENT/PLAN: Patient with severe vascular disease and lower extremities but is stable, no open wounds no redness no drainage her lower extremities are perfused. The patient will followup here in 6 months for repeat duplex and ABIs, her questions were encouraged and answered to her daughter.  Lauree Chandler ANP  Clinic M.D.: Hart Rochester

## 2011-11-19 ENCOUNTER — Ambulatory Visit (HOSPITAL_COMMUNITY)
Admission: RE | Admit: 2011-11-19 | Discharge: 2011-11-19 | Disposition: A | Discharge status: Home / Self Care | Payer: Medicare Other | Source: Ambulatory Visit | Attending: Internal Medicine | Admitting: Internal Medicine

## 2011-11-19 DIAGNOSIS — R269 Unspecified abnormalities of gait and mobility: Secondary | ICD-10-CM | POA: Insufficient documentation

## 2011-11-19 DIAGNOSIS — IMO0001 Reserved for inherently not codable concepts without codable children: Secondary | ICD-10-CM | POA: Insufficient documentation

## 2011-11-19 DIAGNOSIS — M6281 Muscle weakness (generalized): Secondary | ICD-10-CM | POA: Insufficient documentation

## 2011-11-19 NOTE — Evaluation (Signed)
Physical Therapy Evaluation  Patient Details  Name: Katrina Meyer MRN: [id number] Date of Birth: Mar 11, 1932  Today's Date: 11/19/2011 Time: 7846-9629 PT Time Calculation (min): 48 min  Visit#: 1  of 8   Re-eval: 12/19/11 Assessment Diagnosis: unstable gt. Prior Therapy: 2012  Authorization: medicare   Authorization Visit#: 1  of 8    Past Medical History:  Past Medical History  Diagnosis Date  . Hyperlipidemia   . Hypertension   . Diabetes mellitus   . DVT (deep venous thrombosis)   . End stage renal disease on dialysis   . Stroke   . Dementia    Past Surgical History:  Past Surgical History  Procedure Date  . Dg av dialysis graft declot or   . Av fistula repair Aug. 2013    Left arm    Subjective Symptoms/Limitations Symptoms: Ms. Smead comes to the department with her attendent. Ms. Fotheringham has a history of dementia.  She states that she normally uses a walker but that she has had increased difficulty in walking for the past several months.  She is having more difficulty coming from sit to stand and her attendent states that she walks more on her toes now.  She  is being referred to theapy to improve her functional ability and to reduce her risk of falling. How long can you stand comfortably?: The patient is unable to come sit to stand without moderate assist.  She is unable to stand I needs mod assist and only can stand for a minute. How long can you walk comfortably?: The longest she has walked has been with her walker for about 20 steps. Pain Assessment Currently in Pain?: No/denies   Prior Functioning  Home Living Lives With: Spouse  Assessment RLE AROM (degrees) Right Ankle Dorsiflexion: -48  RLE Strength Right Hip Flexion: 3-/5 Right Hip Extension: 2/5 Right Hip ABduction: 2/5 Right Hip ADduction: 2/5 Right Knee Flexion: 2+/5 Right Knee Extension: 3/5 Right Ankle Dorsiflexion: 3-/5 LLE AROM (degrees) Left Ankle Dorsiflexion: -30  LLE  Strength Left Hip Flexion: 3-/5 Left Hip Extension: 2/5 Left Hip ABduction: 2/5 Left Hip ADduction: 2/5 Left Knee Flexion: 2+/5 Left Knee Extension: 3/5 Left Ankle Dorsiflexion: 3-/5  Exercise/Treatments Mobility/Balance  Transfers Transfers: Sit to Stand Sit to Stand: 2: Max assist Ambulation/Gait Ambulation/Gait: Yes Ambulation/Gait Assistance: 4: Min guard Ambulation Distance (Feet): 50 Feet Assistive device: Rolling walker Gait Pattern: Decreased step length - left;Decreased step length - right;Trunk flexed Gait velocity: slow- pt constantly wanting to sit down Static Standing Balance Static Standing - Balance Support: Right upper extremity supported;Left upper extremity supported Static Standing - Level of Assistance: 4: Min assist Static Standing - Comment/# of Minutes: unable to stand for a minute;  without UE assist pt is unable to stand at all falls backward.     Supine Bridges: 10 reps Straight Leg Raises: Strengthening;Both;5 reps Other Supine Knee Exercises: ab/adduction x 10 Other Supine Knee Exercises: PROM for B ankle     Physical Therapy Assessment and Plan PT Assessment and Plan Clinical Impression Statement: Pt with LE, ROM and balance deficits who will benefit from skilled PT to educate care givers and improve the above mentioned deficits to keep Ms. Olgin as functional as possible and reduce the risk of falling. Pt will benefit from skilled therapeutic intervention in order to improve on the following deficits: Decreased activity tolerance;Decreased mobility;Decreased range of motion;Decreased safety awareness;Decreased strength;Difficulty walking Rehab Potential: Fair PT Frequency: Min 2X/week PT Duration: 4 weeks PT Treatment/Interventions:  Gait training;Therapeutic exercise;Manual techniques;Patient/family education PT Plan: begin sit to stand activities; have a significant amount of treatment based on balance and ROM of ankles. See if care-giver was  able to educate other care givers in exercises to be done daily.    Goals Home Exercise Program Pt will Perform Home Exercise Program: with min assist PT Short Term Goals Time to Complete Short Term Goals: 2 weeks PT Short Term Goal 1: sit to stand w/ min-mod assist. PT Short Term Goal 2: Pt to ambulate w/ SBA w/ RW x 100 ft. PT Long Term Goals Time to Complete Long Term Goals: 4 weeks PT Long Term Goal 1: PROM of ankles to be to -15 PT Long Term Goal 2: sit to stand with min assist Long Term Goal 3: caregivers to be I in pt HEP Long Term Goal 4: Pt static standing balance to be fair for 10 seconds,. PT Long Term Goal 5: Pt to be able to ambulate with SBA x 200 ft.  Problem List Patient Active Problem List  Diagnosis  . DM  . HYPERTENSION  . CHRONIC SYSTOLIC HEART FAILURE  . ESRD  . Muscle weakness (generalized)  . Difficulty in walking  . Peripheral vascular disease, unspecified  . Weakness of both legs  . Balance disorder    PT - End of Session Equipment Utilized During Treatment: Gait belt General Behavior During Session: Anxious Cognition: WFL for tasks performed PT Plan of Care PT Home Exercise Plan: PROM and strengthening given to care giver who works with Ms. Orcutt Tue and Thursdays. Consulted and Agree with Plan of Care: Family member/caregiver Family Member Consulted: caregiver.  GP Functional Assessment Tool Used: ABC -filled out by care giver  Functional Limitation: Mobility: Walking and moving around Mobility: Walking and Moving Around Current Status (Z6109): At least 80 percent but less than 100 percent impaired, limited or restricted Mobility: Walking and Moving Around Goal Status [phone]): At least 40 percent but less than 60 percent impaired, limited or restricted  RUSSELL,CINDY 11/19/2011, 4:19 PM  Physician Documentation Your signature is required to indicate approval of the treatment plan as stated above.  Please sign and either send electronically  or make a copy of this report for your files and return this physician signed original.   Please mark one 1.__approve of plan  2. ___approve of plan with the following conditions.   ______________________________                                                          _____________________ Physician Signature                                                                                                             Date

## 2011-11-21 ENCOUNTER — Ambulatory Visit (HOSPITAL_COMMUNITY)
Admission: RE | Admit: 2011-11-21 | Discharge: 2011-11-21 | Disposition: A | Discharge status: Home / Self Care | Payer: Medicare Other | Source: Ambulatory Visit | Attending: Emergency Medicine | Admitting: Emergency Medicine

## 2011-11-21 ENCOUNTER — Other Ambulatory Visit (HOSPITAL_COMMUNITY): Payer: Self-pay | Admitting: Nephrology

## 2011-11-21 DIAGNOSIS — N186 End stage renal disease: Secondary | ICD-10-CM

## 2011-11-21 NOTE — Progress Notes (Signed)
Physical Therapy Treatment Patient Details  Name: Katrina Meyer MRN: [id number] Date of Birth: 04-24-1932  Today's Date: 11/21/2011 Time: 1408-1508 PT Time Calculation (min): 60 min  Visit#: 2  of 8   Re-eval: 12/19/11    Authorization: medicare  Authorization Time Period:    Authorization Visit#: 2  of 8    Subjective: Pt sitter states she was able to give instruction to the other sitters on the HEP.  Pt states that her left hip is sore.   Precautions/Restrictions falls   Exercise/Treatments Mobility/Balance  Transfers Transfers: Sit to Stand Sit to Stand: 2: Max assist Sit to Stand Details (indicate cue type and reason): x 8 -to increase I  Ambulation/Gait Ambulation/Gait: Yes Ambulation/Gait Assistance: 4: Min guard Ambulation Distance (Feet): 80 Feet (x3) Assistive device: 4-wheeled walker    Standing Other Standing Knee Exercises: marching in place x 10. Seated Long Arc Quad: Strengthening;Both;10 reps;Weights Long Arc Quad Weight: 3 lbs. Supine Bridges: 10 reps;Limitations Bridges Limitations: with limited clearance. Other Supine Knee Exercises: Jt mob and PROM for B ankle and mid tarsal jt. Sidelying Hip ABduction: Strengthening;10 reps    Manual Therapy Manual Therapy: Joint mobilization Joint Mobilization: B foot and ankle  Physical Therapy Assessment and Plan PT Assessment and Plan Clinical Impression Statement: Pt needs verbal cuing for proper static standing balanes; noted improvement in ankle motion.  Began jt mobs to improve motion. PT Plan: PT to continue wiith jt mobs for ankles to improve functional mobility    Goals    Problem List Patient Active Problem List  Diagnosis  . DM  . HYPERTENSION  . CHRONIC SYSTOLIC HEART FAILURE  . ESRD  . Muscle weakness (generalized)  . Difficulty in walking  . Peripheral vascular disease, unspecified  . Weakness of both legs  . Balance disorder    PT - End of Session Equipment Utilized During  Treatment: Gait belt Activity Tolerance: Patient tolerated treatment well General Behavior During Session: [facility] for tasks performed Cognition: [facility] for tasks performed  GP    RUSSELL,CINDY 11/21/2011, 4:30 PM

## 2011-11-26 ENCOUNTER — Emergency Department (HOSPITAL_COMMUNITY)
Admission: EM | Admit: 2011-11-26 | Discharge: 2011-11-26 | Disposition: A | Discharge status: Home / Self Care | Payer: Medicare Other | Attending: Emergency Medicine | Admitting: Emergency Medicine

## 2011-11-26 ENCOUNTER — Ambulatory Visit (HOSPITAL_COMMUNITY)
Admission: RE | Admit: 2011-11-26 | Discharge: 2011-11-26 | Disposition: A | Discharge status: Home / Self Care | Payer: Medicare Other | Source: Ambulatory Visit | Attending: Physical Therapy | Admitting: Physical Therapy

## 2011-11-26 ENCOUNTER — Emergency Department (HOSPITAL_COMMUNITY): Payer: Medicare Other

## 2011-11-26 ENCOUNTER — Encounter (HOSPITAL_COMMUNITY): Payer: Self-pay

## 2011-11-26 DIAGNOSIS — E119 Type 2 diabetes mellitus without complications: Secondary | ICD-10-CM | POA: Insufficient documentation

## 2011-11-26 DIAGNOSIS — E785 Hyperlipidemia, unspecified: Secondary | ICD-10-CM | POA: Insufficient documentation

## 2011-11-26 DIAGNOSIS — N186 End stage renal disease: Secondary | ICD-10-CM | POA: Insufficient documentation

## 2011-11-26 DIAGNOSIS — Z8673 Personal history of transient ischemic attack (TIA), and cerebral infarction without residual deficits: Secondary | ICD-10-CM | POA: Insufficient documentation

## 2011-11-26 DIAGNOSIS — Y939 Activity, unspecified: Secondary | ICD-10-CM | POA: Insufficient documentation

## 2011-11-26 DIAGNOSIS — S7000XA Contusion of unspecified hip, initial encounter: Secondary | ICD-10-CM | POA: Insufficient documentation

## 2011-11-26 DIAGNOSIS — F0390 Unspecified dementia without behavioral disturbance: Secondary | ICD-10-CM | POA: Insufficient documentation

## 2011-11-26 DIAGNOSIS — I120 Hypertensive chronic kidney disease with stage 5 chronic kidney disease or end stage renal disease: Secondary | ICD-10-CM | POA: Insufficient documentation

## 2011-11-26 DIAGNOSIS — Z86718 Personal history of other venous thrombosis and embolism: Secondary | ICD-10-CM | POA: Insufficient documentation

## 2011-11-26 DIAGNOSIS — Y929 Unspecified place or not applicable: Secondary | ICD-10-CM | POA: Insufficient documentation

## 2011-11-26 DIAGNOSIS — W1789XA Other fall from one level to another, initial encounter: Secondary | ICD-10-CM | POA: Insufficient documentation

## 2011-11-26 MED ORDER — TRAMADOL HCL 50 MG PO TABS: 50.0000 mg | ORAL_TABLET | Freq: Four times a day (QID) | ORAL | Status: DC | PRN

## 2011-11-26 MED ORDER — OXYCODONE-ACETAMINOPHEN 5-325 MG PO TABS
1.0000 | ORAL_TABLET | Freq: Once | ORAL | Status: AC
  Administered 2011-11-26: 1 via ORAL
  Filled 2011-11-26: qty 1

## 2011-11-26 NOTE — ED Notes (Signed)
Fell on Friday, can't walk, hurts to do therapy per caretaker. Right hip pain.

## 2011-11-26 NOTE — ED Provider Notes (Signed)
History  This chart was scribed for Katrina Lennert, MD by Bennett Scrape. This patient was seen in room APA11/APA11 and the patient's care was started at 8:36pm  CSN: [id number]  Arrival date & time 11/26/11  2008   First MD Initiated Contact with Patient 11/26/11 2036     Level 5 Caveat-H/O Dementia  Chief Complaint  Patient presents with  . Fall  . Hip Pain     Patient is a 78 y.o. female presenting with fall. The history is provided by a caregiver. No language interpreter was used.  Fall The accident occurred more than 2 days ago. The fall occurred in unknown circumstances. She fell from an unknown height. The pain is present in the left hip. The pain is moderate. She was not ambulatory at the scene.    Katrina Meyer is a 78 y.o. female with a h/o dementia brought in by caretaker who presents to the Emergency Department for left hip pain due to a fall 4 days ago. Caretaker reports that the pt has been unable to ambulate since; however, caretaker is not present to answer more questions and no further information was provided. She also has a h/o HLD, HTN, DM and ESRD.   Past Medical History  Diagnosis Date  . Hyperlipidemia   . Hypertension   . Diabetes mellitus   . DVT (deep venous thrombosis)   . End stage renal disease on dialysis   . Stroke   . Dementia     Past Surgical History  Procedure Date  . Dg av dialysis graft declot or   . Av fistula repair Aug. 2013    Left arm    Family History  Problem Relation Age of Onset  . Stroke Mother   . Diabetes Mother   . Cancer Mother   . Hypertension Mother   . Coronary artery disease Father   . Diabetes Sister     History  Substance Use Topics  . Smoking status: Never Smoker   . Smokeless tobacco: Not on file  . Alcohol Use: No    No OB history provided.  Review of Systems  Unable to perform ROS: Dementia    Allergies  Review of patient's allergies indicates no known allergies.  Home Medications    Current Outpatient Rx  Name Route Sig Dispense Refill  . AMLODIPINE BESYLATE 5 MG PO TABS Oral Take 1 tablet by mouth Daily.    . ATORVASTATIN CALCIUM 10 MG PO TABS Oral Take 1 tablet by mouth Daily.    Marland Kitchen CARBIDOPA-LEVODOPA 25-100 MG PO TABS      . CARVEDILOL 6.25 MG PO TABS Oral Take 1 tablet by mouth Daily.    . DONEPEZIL HCL 10 MG PO TABS Oral Take 1 tablet by mouth Daily.    Marland Kitchen FEXOFENADINE HCL 180 MG PO TABS Oral Take 180 mg by mouth daily.    Marland Kitchen FLUOXETINE HCL 40 MG PO CAPS Oral Take 1 capsule by mouth Daily.    Marland Kitchen FOSRENOL 500 MG PO CHEW Oral Chew 1 tablet by mouth 3 (three) times daily with meals. Also takes with snacks    . GLIPIZIDE XL 10 MG PO TB24 Oral Take 1 tablet by mouth Daily.    . INSULIN GLARGINE 100 UNIT/ML [place] SOLN Subcutaneous Inject 4 Units into the skin at bedtime.     . INSULIN LISPRO (HUMAN) 100 UNIT/ML Katrina Meyer SOLN Subcutaneous Inject into the skin 3 (three) times daily before meals. As directed per sliding scale    .  AUTO-LANCET MISC      . LEVOTHYROXINE SODIUM 25 MCG PO TABS Oral Take 25 mcg by mouth every morning.    Marland Kitchen LOPERAMIDE HCL 2 MG PO CAPS Oral Take 2 mg by mouth daily as needed.    Marland Kitchen RENA-VITE PO TABS Oral Take 1 tablet by mouth daily.    . OXYCODONE-ACETAMINOPHEN 5-325 MG PO TABS Oral Take 1 tablet by mouth every 6 (six) hours as needed. For pain    . QC PEN NEEDLES 31G X 8 MM MISC      . RAMIPRIL 5 MG PO CAPS Oral Take 5 mg by mouth 2 (two) times daily.    Marland Kitchen SEVELAMER CARBONATE 800 MG PO TABS Oral Take 800-1,600 mg by mouth 3 (three) times daily with meals. Take two with meals and one with snacks    . TRAMADOL HCL 50 MG PO TABS  as needed.      Triage Vitals: BP 121/70  Pulse 100  Temp 98.2 F (36.8 C) (Oral)  Resp 16  Ht 5\' 7"  (1.702 m)  Wt 175 lb (79.379 kg)  BMI 27.41 kg/m2  SpO2 91%  Physical Exam  Nursing note and vitals reviewed. Constitutional: She appears well-developed and well-nourished.  HENT:  Head: Normocephalic and atraumatic.   Eyes: Conjunctivae normal and EOM are normal. No scleral icterus.  Neck: Neck supple. No thyromegaly present.  Cardiovascular: Normal rate and regular rhythm.  Exam reveals no gallop and no friction rub.   No murmur heard. Pulmonary/Chest: Effort normal and breath sounds normal. No stridor. She has no wheezes. She has no rales. She exhibits no tenderness.  Abdominal: She exhibits no distension. There is no tenderness. There is no rebound.  Musculoskeletal: Normal range of motion. She exhibits tenderness. She exhibits no edema.       Tenderness in left hip  Lymphadenopathy:    She has no cervical adenopathy.  Neurological: She is alert. Coordination normal.       She is oriented to person and place  Skin: Skin is warm and dry. No rash noted.    ED Course  Procedures (including critical care time)  DIAGNOSTIC STUDIES: Oxygen Saturation is 91% on room air, adequate by my interpretation.    COORDINATION OF CARE: 8:45PM-Will order x-ray of left hip  Labs Reviewed - No data to display Dg Hip Complete Left  11/26/2011  *RADIOLOGY REPORT*  Clinical Data: Fall.  Left hip pain.  Diabetes.  Hypertension.  LEFT HIP - COMPLETE 2+ VIEW  Comparison: None.  Findings: Marked atherosclerotic calcification noted.  Probable calcified left uterine fibroid.  There is vascular phleboliths noted.  Bony demineralization is present.  No cortical discontinuity characteristic of fracture is observed.  IMPRESSION:  1.  No fracture identified.  Reduced sensitivity for subtle fractures due to bony demineralization.  If pain persists despite conservative therapy, MRI or CT may be warranted. 2.  Prominent atherosclerosis. 3.  Possible calcified left uterine fibroid.   Original Report Authenticated By: Dellia Cloud, M.D.      No diagnosis found.    MDM      The chart was scribed for me under my direct supervision.  I personally performed the history, physical, and medical decision making and all  procedures in the evaluation of this patient.Katrina Lennert, MD 11/26/11 [phone]

## 2011-11-26 NOTE — ED Notes (Signed)
Fall by history of caretaker.  Caretaker reports right hip pain, patient reports left hip pain.  Left hip tender to palpation.

## 2011-11-26 NOTE — ED Notes (Signed)
The patient was asked what happened to her today and she states "I don't want to talk about it."  The patient has a history of dementia and is unwilling to answer questions at this time.

## 2011-11-26 NOTE — Progress Notes (Signed)
Physical Therapy Treatment Patient Details  Name: Katrina Meyer MRN: [id number] Date of Birth: 04/11/1932 Delete this note it is a duplicate  RUSSELL,CINDY 11/27/2011, 4:19 PM

## 2011-11-27 NOTE — Progress Notes (Signed)
Physical Therapy Treatment Patient Details  Name: Katrina Meyer MRN: [id number] Date of Birth: 09/04/1932  Today's Date: 11/27/2011 Time: 1300-1345 PT Time Calculation (min): 45 min  Visit#: 3  of 4   Re-eval: 12/03/11    Authorization: medicare   Authorization Visit#: 3  of 4    Subjective: Symptoms/Limitations Symptoms: [name] attendent states that she has fallen twice over the weekend.  It has been very difficult to do the exercises due to soreness from the falls.  Precautions/Restrictions   falls Exercise/Treatments        Standing Other Standing Knee Exercises: gt 39ft, 57ft, 10 ft. Seated Other Seated Knee Exercises: worked on scooting forward then leaning forward in prep to stand Other Seated Knee Exercises: sit to stand x 5 Supine Heel Slides: AROM;Both;10 reps Straight Leg Raises:  (attempted but pt stated she was to sore to complete SLR) Other Supine Knee Exercises: JT mobs/PROM B ankle    Physical Therapy Assessment and Plan PT Assessment and Plan Clinical Impression Statement: Instructed care giver on scooting pt to edge of chair and rocking to make sit to stand easier.  Used manual and verbal cues  for weight shifting to increase ease of leg advancement PT Plan: Continue to work with attendent for ambulation.  Pt needs aggressive jt mobs and PROM to improve ankle DF to allow more normalized gt.    Goals    Problem List Patient Active Problem List  Diagnosis  . DM  . HYPERTENSION  . CHRONIC SYSTOLIC HEART FAILURE  . ESRD  . Muscle weakness (generalized)  . Difficulty in walking  . Peripheral vascular disease, unspecified  . Weakness of both legs  . Balance disorder    PT - End of Session Equipment Utilized During Treatment: Gait belt  GP    Saphyre Cillo,CINDY 11/27/2011, 4:27 PM

## 2011-11-28 ENCOUNTER — Ambulatory Visit (HOSPITAL_COMMUNITY): Payer: Medicare Other | Admitting: *Deleted

## 2011-11-28 ENCOUNTER — Other Ambulatory Visit (HOSPITAL_COMMUNITY): Payer: Self-pay | Admitting: Nephrology

## 2011-11-28 ENCOUNTER — Ambulatory Visit (HOSPITAL_COMMUNITY)
Admission: RE | Admit: 2011-11-28 | Discharge: 2011-11-28 | Disposition: A | Discharge status: Home / Self Care | Payer: Medicare Other | Source: Ambulatory Visit | Attending: Nephrology | Admitting: Nephrology

## 2011-11-28 DIAGNOSIS — N186 End stage renal disease: Secondary | ICD-10-CM

## 2011-11-28 DIAGNOSIS — T82898A Other specified complication of vascular prosthetic devices, implants and grafts, initial encounter: Secondary | ICD-10-CM | POA: Insufficient documentation

## 2011-11-28 DIAGNOSIS — I871 Compression of vein: Secondary | ICD-10-CM | POA: Insufficient documentation

## 2011-11-28 DIAGNOSIS — Y832 Surgical operation with anastomosis, bypass or graft as the cause of abnormal reaction of the patient, or of later complication, without mention of misadventure at the time of the procedure: Secondary | ICD-10-CM | POA: Insufficient documentation

## 2011-11-28 MED ORDER — IOHEXOL 300 MG/ML  SOLN
100.0000 mL | Freq: Once | INTRAMUSCULAR | Status: AC | PRN
  Administered 2011-11-28: 40 mL via INTRAVENOUS

## 2011-11-28 MED ORDER — CHLORHEXIDINE GLUCONATE 4 % EX LIQD
CUTANEOUS | Status: AC
  Filled 2011-11-28: qty 30

## 2011-11-28 NOTE — Procedures (Signed)
LUE AV fistulagram Central occlusion No comp

## 2011-12-03 ENCOUNTER — Inpatient Hospital Stay (HOSPITAL_COMMUNITY): Admission: RE | Admit: 2011-12-03 | Payer: Medicare Other | Source: Ambulatory Visit | Admitting: *Deleted

## 2011-12-04 ENCOUNTER — Other Ambulatory Visit (HOSPITAL_COMMUNITY): Payer: Self-pay | Admitting: Nephrology

## 2011-12-04 DIAGNOSIS — N186 End stage renal disease: Secondary | ICD-10-CM

## 2011-12-05 ENCOUNTER — Inpatient Hospital Stay (HOSPITAL_COMMUNITY): Admission: RE | Admit: 2011-12-05 | Payer: Medicare Other | Source: Ambulatory Visit | Admitting: Physical Therapy

## 2011-12-10 ENCOUNTER — Ambulatory Visit (HOSPITAL_COMMUNITY): Payer: Medicare Other | Admitting: *Deleted

## 2011-12-10 ENCOUNTER — Ambulatory Visit (HOSPITAL_COMMUNITY)
Admission: RE | Admit: 2011-12-10 | Discharge: 2011-12-10 | Disposition: A | Discharge status: Home / Self Care | Payer: Medicare Other | Source: Ambulatory Visit | Attending: Nephrology | Admitting: Nephrology

## 2011-12-10 DIAGNOSIS — Z992 Dependence on renal dialysis: Secondary | ICD-10-CM | POA: Insufficient documentation

## 2011-12-10 DIAGNOSIS — Z4901 Encounter for fitting and adjustment of extracorporeal dialysis catheter: Secondary | ICD-10-CM | POA: Insufficient documentation

## 2011-12-10 DIAGNOSIS — N186 End stage renal disease: Secondary | ICD-10-CM | POA: Insufficient documentation

## 2011-12-10 MED ORDER — CHLORHEXIDINE GLUCONATE 4 % EX LIQD
CUTANEOUS | Status: AC
  Filled 2011-12-10: qty 30

## 2011-12-10 NOTE — Progress Notes (Signed)
Dressing to right chest clean dry and intact post removal HD catheter. Katrina Meyer with  tegaderm)

## 2011-12-10 NOTE — Procedures (Signed)
Interventional Radiology Procedure Note  Procedure: Successful removal of right IJ approach tunneled HD catheter Complications: None Recommendations: - Routine wound care  Signed,  Sterling Big, MD Vascular & Interventional Radiologist [facility]

## 2011-12-10 NOTE — Progress Notes (Signed)
Dressing right chest remains clean dry and intact and surrounding tissue a level 0. Patient in w/c discharged to care of caretaker who will be driving patient home.

## 2011-12-12 ENCOUNTER — Inpatient Hospital Stay (HOSPITAL_COMMUNITY): Admission: RE | Admit: 2011-12-12 | Payer: Medicare Other | Source: Ambulatory Visit | Admitting: Physical Therapy

## 2011-12-19 ENCOUNTER — Ambulatory Visit (HOSPITAL_COMMUNITY)
Admission: RE | Admit: 2011-12-19 | Discharge: 2011-12-19 | Disposition: A | Discharge status: Home / Self Care | Payer: Medicare Other | Source: Ambulatory Visit | Attending: Emergency Medicine | Admitting: Emergency Medicine

## 2011-12-19 DIAGNOSIS — M6281 Muscle weakness (generalized): Secondary | ICD-10-CM | POA: Insufficient documentation

## 2011-12-19 DIAGNOSIS — R2689 Other abnormalities of gait and mobility: Secondary | ICD-10-CM

## 2011-12-19 DIAGNOSIS — R29898 Other symptoms and signs involving the musculoskeletal system: Secondary | ICD-10-CM

## 2011-12-19 DIAGNOSIS — IMO0001 Reserved for inherently not codable concepts without codable children: Secondary | ICD-10-CM | POA: Insufficient documentation

## 2011-12-19 DIAGNOSIS — R269 Unspecified abnormalities of gait and mobility: Secondary | ICD-10-CM | POA: Insufficient documentation

## 2011-12-19 NOTE — Evaluation (Signed)
Physical Therapy re-assessment  Patient Details  Name: Katrina Meyer MRN: [id number] Date of Birth: Dec 09, 1932  Today's Date: 12/19/2011 Time: 1100-1147 PT Time Calculation (min): 47 min  Visit#: 4  of 4   Re-eval:   Assessment Diagnosis: unstable gt. Prior Therapy: 2012  Authorization: medicare   Authorization Visit#: 4  of 4    Past Medical History:  Past Medical History  Diagnosis Date  . Hyperlipidemia   . Hypertension   . Diabetes mellitus   . DVT (deep venous thrombosis)   . End stage renal disease on dialysis   . Stroke   . Dementia    Past Surgical History:  Past Surgical History  Procedure Date  . Dg av dialysis graft declot or   . Av fistula repair Aug. 2013    Left arm    Subjective Symptoms/Limitations Symptoms: Katrina Meyer has a new  attendent who states that she is no longer walking on a regular basis.  She states thatt she has not seen an exercise program.   How long can you stand comfortably?: The pateint needs moderate assistance to come sit to stand able to stand with moderate assistance How long can you walk comfortably?: not walking  Precautions/Restrictions   falls  Prior Functioning  Home Living Lives With: Spouse   Assessment RLE AROM (degrees) Right Ankle Dorsiflexion: -35  RLE Strength Right Hip Flexion: 3-/5 Right Hip Extension: 2/5 Right Hip ABduction: 2/5 Right Hip ADduction: 2/5 Right Knee Flexion: 2+/5 Right Knee Extension: 3/5 Right Ankle Dorsiflexion: 3-/5 LLE AROM (degrees) Left Ankle Dorsiflexion: -27  LLE Strength Left Hip Flexion: 3-/5 Left Hip Extension: 2/5 Left Hip ABduction: 2/5 Left Hip ADduction: 2/5 Left Knee Flexion: 2+/5 Left Knee Extension: 3/5 Left Ankle Dorsiflexion: 3-/5  Improved ROM but no improvement in strength.  Exercise/Treatments     Standing Other Standing Knee Exercises: gt x 25 ftw supervision Seated Long Arc Quad: Strengthening;Both;10 reps Other Seated Knee Exercises: sit to  stand w/ mod assist. Supine Bridges: 10 reps Bridges Limitations: unable to get bottom totally off mat Straight Leg Raises: Both;5 reps Other Supine Knee Exercises: PROM to ankles     Physical Therapy Assessment and Plan PT Assessment and Plan Clinical Impression Statement: Pt has new care giver, reviewed exercise sheet, PROM and easier method to come sit to stand with pt and caregiver.  Explained to caregiver that due to Katrina Meyer dementia therapy is more educating care givers as there will be no carryover of information secondary to dementia.  Caregiver verbalized understanding and understanding of HEP and the importance of completing every day.  Therapist gave care giver four exercise sheets so each aide can have their own sheet.   Rehab Potential: Fair PT Plan: discharge pt to home program with aides.  Emphasized that it would be best if each aide completed the exercises one time on their shift so Katrina Meyer would be completing exercise 3-4 times a day.    Goals Home Exercise Program PT Goal: Perform Home Exercise Program - Progress: Progressing toward goal (new aide-aide is eager to help Katrina Meyer but has not seen ex) PT Short Term Goals PT Short Term Goal 1 - Progress: Not met PT Short Term Goal 2 - Progress: Progressing toward goal PT Long Term Goals PT Long Term Goal 1 - Progress: Progressing toward goal Long Term Goal 3 Progress: Progressing toward goal Long Term Goal 5 Progress: Not met  Problem List Patient Active Problem List  Diagnosis  .  DM  . HYPERTENSION  . CHRONIC SYSTOLIC HEART FAILURE  . ESRD  . Muscle weakness (generalized)  . Difficulty in walking  . Peripheral vascular disease, unspecified  . Weakness of both legs  . Balance disorder    PT - End of Session Equipment Utilized During Treatment: Gait belt Activity Tolerance: Patient tolerated treatment well General Behavior During Session: Flat affect Cognition: Impaired, at baseline PT Plan of Care PT  Home Exercise Plan: initial exercises remain valid.  Aide given four sets to give to other aids.  GP Functional Assessment Tool Used: clinical judgement- care givers first day so she is unable to fill out and pt has dementia  Functional Limitation: Mobility: Walking and moving around Mobility: Walking and Moving Around Goal Status [phone]): At least 40 percent but less than 60 percent impaired, limited or restricted Mobility: Walking and Moving Around Discharge Status [phone]): At least 60 percent but less than 80 percent impaired, limited or restricted  Anielle Headrick,CINDY 12/19/2011, 12:00 PM

## 2012-01-04 ENCOUNTER — Emergency Department (HOSPITAL_COMMUNITY): Payer: Medicare Other

## 2012-01-04 ENCOUNTER — Encounter (HOSPITAL_COMMUNITY): Payer: Self-pay | Admitting: *Deleted

## 2012-01-04 ENCOUNTER — Inpatient Hospital Stay (HOSPITAL_COMMUNITY)
Admission: EM | Admit: 2012-01-04 | Discharge: 2012-01-07 | DRG: 562 | Disposition: A | Discharge status: Home Health | Payer: Medicare Other | Attending: Internal Medicine | Admitting: Internal Medicine

## 2012-01-04 DIAGNOSIS — I120 Hypertensive chronic kidney disease with stage 5 chronic kidney disease or end stage renal disease: Secondary | ICD-10-CM | POA: Diagnosis present

## 2012-01-04 DIAGNOSIS — S82409A Unspecified fracture of shaft of unspecified fibula, initial encounter for closed fracture: Principal | ICD-10-CM | POA: Diagnosis present

## 2012-01-04 DIAGNOSIS — E785 Hyperlipidemia, unspecified: Secondary | ICD-10-CM | POA: Diagnosis present

## 2012-01-04 DIAGNOSIS — Z86718 Personal history of other venous thrombosis and embolism: Secondary | ICD-10-CM

## 2012-01-04 DIAGNOSIS — E41 Nutritional marasmus: Secondary | ICD-10-CM | POA: Diagnosis present

## 2012-01-04 DIAGNOSIS — Z6828 Body mass index (BMI) 28.0-28.9, adult: Secondary | ICD-10-CM

## 2012-01-04 DIAGNOSIS — G20 Parkinson's disease: Secondary | ICD-10-CM | POA: Diagnosis present

## 2012-01-04 DIAGNOSIS — G20A1 Parkinson's disease without dyskinesia, without mention of fluctuations: Secondary | ICD-10-CM | POA: Diagnosis present

## 2012-01-04 DIAGNOSIS — S82201A Unspecified fracture of shaft of right tibia, initial encounter for closed fracture: Secondary | ICD-10-CM

## 2012-01-04 DIAGNOSIS — Z992 Dependence on renal dialysis: Secondary | ICD-10-CM

## 2012-01-04 DIAGNOSIS — M6281 Muscle weakness (generalized): Secondary | ICD-10-CM

## 2012-01-04 DIAGNOSIS — E119 Type 2 diabetes mellitus without complications: Secondary | ICD-10-CM | POA: Diagnosis present

## 2012-01-04 DIAGNOSIS — S82209A Unspecified fracture of shaft of unspecified tibia, initial encounter for closed fracture: Secondary | ICD-10-CM

## 2012-01-04 DIAGNOSIS — I509 Heart failure, unspecified: Secondary | ICD-10-CM | POA: Diagnosis present

## 2012-01-04 DIAGNOSIS — N186 End stage renal disease: Secondary | ICD-10-CM | POA: Diagnosis present

## 2012-01-04 DIAGNOSIS — Z79899 Other long term (current) drug therapy: Secondary | ICD-10-CM

## 2012-01-04 DIAGNOSIS — D649 Anemia, unspecified: Secondary | ICD-10-CM

## 2012-01-04 DIAGNOSIS — R29898 Other symptoms and signs involving the musculoskeletal system: Secondary | ICD-10-CM

## 2012-01-04 DIAGNOSIS — R262 Difficulty in walking, not elsewhere classified: Secondary | ICD-10-CM

## 2012-01-04 DIAGNOSIS — F0390 Unspecified dementia without behavioral disturbance: Secondary | ICD-10-CM | POA: Diagnosis present

## 2012-01-04 DIAGNOSIS — I739 Peripheral vascular disease, unspecified: Secondary | ICD-10-CM | POA: Diagnosis present

## 2012-01-04 DIAGNOSIS — I10 Essential (primary) hypertension: Secondary | ICD-10-CM

## 2012-01-04 DIAGNOSIS — I5022 Chronic systolic (congestive) heart failure: Secondary | ICD-10-CM

## 2012-01-04 DIAGNOSIS — Z794 Long term (current) use of insulin: Secondary | ICD-10-CM

## 2012-01-04 DIAGNOSIS — I69991 Dysphagia following unspecified cerebrovascular disease: Secondary | ICD-10-CM

## 2012-01-04 DIAGNOSIS — M24573 Contracture, unspecified ankle: Secondary | ICD-10-CM | POA: Diagnosis present

## 2012-01-04 DIAGNOSIS — R2689 Other abnormalities of gait and mobility: Secondary | ICD-10-CM

## 2012-01-04 DIAGNOSIS — S82109A Unspecified fracture of upper end of unspecified tibia, initial encounter for closed fracture: Principal | ICD-10-CM | POA: Diagnosis present

## 2012-01-04 DIAGNOSIS — N2581 Secondary hyperparathyroidism of renal origin: Secondary | ICD-10-CM | POA: Diagnosis present

## 2012-01-04 DIAGNOSIS — N039 Chronic nephritic syndrome with unspecified morphologic changes: Secondary | ICD-10-CM | POA: Diagnosis present

## 2012-01-04 DIAGNOSIS — W19XXXA Unspecified fall, initial encounter: Secondary | ICD-10-CM | POA: Diagnosis present

## 2012-01-04 DIAGNOSIS — Y92009 Unspecified place in unspecified non-institutional (private) residence as the place of occurrence of the external cause: Secondary | ICD-10-CM

## 2012-01-04 DIAGNOSIS — D631 Anemia in chronic kidney disease: Secondary | ICD-10-CM | POA: Diagnosis present

## 2012-01-04 DIAGNOSIS — M24576 Contracture, unspecified foot: Secondary | ICD-10-CM | POA: Diagnosis present

## 2012-01-04 DIAGNOSIS — R1310 Dysphagia, unspecified: Secondary | ICD-10-CM | POA: Diagnosis present

## 2012-01-04 DIAGNOSIS — E039 Hypothyroidism, unspecified: Secondary | ICD-10-CM | POA: Diagnosis present

## 2012-01-04 HISTORY — DX: Unspecified fracture of shaft of unspecified tibia, initial encounter for closed fracture: S82.209A

## 2012-01-04 LAB — CBC
Hemoglobin: 9 g/dL — ABNORMAL LOW (ref 12.0–15.0)
MCV: 92.7 fL (ref 78.0–100.0)
Platelets: 339 10*3/uL (ref 150–400)
RBC: 3.13 MIL/uL — ABNORMAL LOW (ref 3.87–5.11)
WBC: 8.2 10*3/uL (ref 4.0–10.5)

## 2012-01-04 LAB — BASIC METABOLIC PANEL
CO2: 29 mEq/L (ref 19–32)
Chloride: 95 mEq/L — ABNORMAL LOW (ref 96–112)
Creatinine, Ser: 6 mg/dL — ABNORMAL HIGH (ref 0.50–1.10)
Glucose, Bld: 121 mg/dL — ABNORMAL HIGH (ref 70–99)

## 2012-01-04 LAB — HEMOGLOBIN A1C
Hgb A1c MFr Bld: 6.7 % — ABNORMAL HIGH (ref <5.7)
Mean Plasma Glucose: 146 mg/dL — ABNORMAL HIGH (ref <117)

## 2012-01-04 LAB — TYPE AND SCREEN: Antibody Screen: NEGATIVE

## 2012-01-04 LAB — PROTIME-INR
INR: 1.13 (ref 0.00–1.49)
Prothrombin Time: 14.3 seconds (ref 11.6–15.2)

## 2012-01-04 MED ORDER — CARBIDOPA-LEVODOPA 25-100 MG PO TABS
1.5000 | ORAL_TABLET | Freq: Three times a day (TID) | ORAL | Status: DC
  Administered 2012-01-04 – 2012-01-07 (×5): 1.5 via ORAL
  Filled 2012-01-04 (×11): qty 1.5

## 2012-01-04 MED ORDER — ADULT MULTIVITAMIN W/MINERALS CH
1.0000 | ORAL_TABLET | Freq: Every day | ORAL | Status: DC
  Administered 2012-01-04 – 2012-01-07 (×3): 1 via ORAL
  Filled 2012-01-04 (×4): qty 1

## 2012-01-04 MED ORDER — SODIUM CHLORIDE 0.9 % IJ SOLN
3.0000 mL | Freq: Two times a day (BID) | INTRAMUSCULAR | Status: DC
  Administered 2012-01-04 – 2012-01-07 (×5): 3 mL via INTRAVENOUS

## 2012-01-04 MED ORDER — SEVELAMER CARBONATE 800 MG PO TABS
800.0000 mg | ORAL_TABLET | Freq: Two times a day (BID) | ORAL | Status: DC | PRN
  Filled 2012-01-04: qty 1

## 2012-01-04 MED ORDER — ENOXAPARIN SODIUM 40 MG/0.4ML ~~LOC~~ SOLN: 40.0000 mg | SUBCUTANEOUS | Status: DC

## 2012-01-04 MED ORDER — ENOXAPARIN SODIUM 30 MG/0.3ML ~~LOC~~ SOLN
30.0000 mg | SUBCUTANEOUS | Status: DC
  Administered 2012-01-04 – 2012-01-06 (×3): 30 mg via SUBCUTANEOUS
  Filled 2012-01-04 (×4): qty 0.3

## 2012-01-04 MED ORDER — SODIUM CHLORIDE 0.9 % IJ SOLN: 3.0000 mL | INTRAMUSCULAR | Status: DC | PRN

## 2012-01-04 MED ORDER — INSULIN ASPART 100 UNIT/ML ~~LOC~~ SOLN
0.0000 [IU] | Freq: Three times a day (TID) | SUBCUTANEOUS | Status: DC
  Administered 2012-01-05 – 2012-01-06 (×3): 2 [IU] via SUBCUTANEOUS
  Administered 2012-01-06: 3 [IU] via SUBCUTANEOUS
  Administered 2012-01-07: 5 [IU] via SUBCUTANEOUS
  Administered 2012-01-07: 3 [IU] via SUBCUTANEOUS
  Administered 2012-01-07: 2 [IU] via SUBCUTANEOUS

## 2012-01-04 MED ORDER — ACETAMINOPHEN 325 MG PO TABS: 650.0000 mg | ORAL_TABLET | Freq: Four times a day (QID) | ORAL | Status: DC | PRN

## 2012-01-04 MED ORDER — GUAIFENESIN-DM 100-10 MG/5ML PO SYRP
5.0000 mL | ORAL_SOLUTION | ORAL | Status: DC | PRN
  Filled 2012-01-04: qty 5

## 2012-01-04 MED ORDER — INSULIN GLARGINE 100 UNIT/ML ~~LOC~~ SOLN: 5.0000 [IU] | Freq: Every day | SUBCUTANEOUS | Status: DC

## 2012-01-04 MED ORDER — FLUOXETINE HCL 20 MG PO CAPS
40.0000 mg | ORAL_CAPSULE | Freq: Every day | ORAL | Status: DC
  Administered 2012-01-04 – 2012-01-07 (×3): 40 mg via ORAL
  Filled 2012-01-04 (×4): qty 2

## 2012-01-04 MED ORDER — HYDROMORPHONE HCL PF 1 MG/ML IJ SOLN: 0.5000 mg | INTRAMUSCULAR | Status: DC | PRN

## 2012-01-04 MED ORDER — ALBUTEROL SULFATE (5 MG/ML) 0.5% IN NEBU: 2.5000 mg | INHALATION_SOLUTION | RESPIRATORY_TRACT | Status: DC | PRN

## 2012-01-04 MED ORDER — RAMIPRIL 5 MG PO CAPS
5.0000 mg | ORAL_CAPSULE | Freq: Two times a day (BID) | ORAL | Status: DC
  Administered 2012-01-04 – 2012-01-06 (×2): 5 mg via ORAL
  Filled 2012-01-04 (×6): qty 1

## 2012-01-04 MED ORDER — ACETAMINOPHEN 650 MG RE SUPP: 650.0000 mg | Freq: Four times a day (QID) | RECTAL | Status: DC | PRN

## 2012-01-04 MED ORDER — LORATADINE 10 MG PO TABS
10.0000 mg | ORAL_TABLET | Freq: Every day | ORAL | Status: DC
  Administered 2012-01-04 – 2012-01-07 (×3): 10 mg via ORAL
  Filled 2012-01-04 (×5): qty 1

## 2012-01-04 MED ORDER — MORPHINE SULFATE 2 MG/ML IJ SOLN
1.0000 mg | INTRAMUSCULAR | Status: DC | PRN
  Administered 2012-01-05: 1 mg via INTRAVENOUS
  Filled 2012-01-04: qty 1

## 2012-01-04 MED ORDER — SODIUM CHLORIDE 0.9 % IV SOLN: 250.0000 mL | INTRAVENOUS | Status: DC | PRN

## 2012-01-04 MED ORDER — LEVOTHYROXINE SODIUM 25 MCG PO TABS
25.0000 ug | ORAL_TABLET | Freq: Every morning | ORAL | Status: DC
  Administered 2012-01-05 – 2012-01-07 (×2): 25 ug via ORAL
  Filled 2012-01-04 (×4): qty 1

## 2012-01-04 MED ORDER — LANTHANUM CARBONATE 500 MG PO CHEW
500.0000 mg | CHEWABLE_TABLET | Freq: Three times a day (TID) | ORAL | Status: DC
  Administered 2012-01-05 – 2012-01-07 (×8): 500 mg via ORAL
  Filled 2012-01-04 (×12): qty 1

## 2012-01-04 MED ORDER — INSULIN GLARGINE 100 UNIT/ML ~~LOC~~ SOLN
4.0000 [IU] | Freq: Every day | SUBCUTANEOUS | Status: DC
  Administered 2012-01-04 – 2012-01-06 (×3): 4 [IU] via SUBCUTANEOUS

## 2012-01-04 MED ORDER — SEVELAMER CARBONATE 800 MG PO TABS
1600.0000 mg | ORAL_TABLET | Freq: Three times a day (TID) | ORAL | Status: DC
  Administered 2012-01-05: 1600 mg via ORAL
  Filled 2012-01-04 (×5): qty 2

## 2012-01-04 MED ORDER — AMLODIPINE BESYLATE 5 MG PO TABS
5.0000 mg | ORAL_TABLET | Freq: Every day | ORAL | Status: DC
  Administered 2012-01-04: 5 mg via ORAL
  Filled 2012-01-04 (×3): qty 1

## 2012-01-04 MED ORDER — HYDROCODONE-ACETAMINOPHEN 5-325 MG PO TABS
1.0000 | ORAL_TABLET | ORAL | Status: DC | PRN
  Administered 2012-01-04: 1 via ORAL
  Administered 2012-01-05 – 2012-01-07 (×4): 2 via ORAL
  Filled 2012-01-04 (×2): qty 2
  Filled 2012-01-04: qty 1
  Filled 2012-01-04 (×2): qty 2

## 2012-01-04 MED ORDER — CARVEDILOL 6.25 MG PO TABS
6.2500 mg | ORAL_TABLET | Freq: Two times a day (BID) | ORAL | Status: DC
  Administered 2012-01-05 – 2012-01-07 (×4): 6.25 mg via ORAL
  Filled 2012-01-04 (×9): qty 1

## 2012-01-04 MED ORDER — ATORVASTATIN CALCIUM 10 MG PO TABS
10.0000 mg | ORAL_TABLET | Freq: Every day | ORAL | Status: DC
  Administered 2012-01-05 – 2012-01-07 (×2): 10 mg via ORAL
  Filled 2012-01-04 (×5): qty 1

## 2012-01-04 MED ORDER — DONEPEZIL HCL 10 MG PO TABS
10.0000 mg | ORAL_TABLET | Freq: Every day | ORAL | Status: DC
  Administered 2012-01-04 – 2012-01-06 (×3): 10 mg via ORAL
  Filled 2012-01-04 (×5): qty 1

## 2012-01-04 NOTE — Consult Note (Signed)
Reason for Consult: Right proximal tibia-fibula fracture Referring Physician: Dr. Patria Mane Consulting Physician:Delayla Hoffmaster E  Orthopedic Diagnosis: 1) closed right proximal one third tibia-fibula fracture mildly displaced 2) moderate severe peripheral vascular disease calcific with diabetes and end-stage renal failure. 3) bilateral heel cord contractures secondary to prolong recumbancy and lack of ambulation. 4) left heel decubitus with dry area of skin necrosis involving the left posterior medial heel appears clean and chronic.  Katrina Meyer is an 78 y.o. female with history of diabetes hypertension hyperlipidemia and end-stage renal disease on hemodialysis. She also has a history of deep venous thrombosis. She fell early this morning 1 AM when getting up on her walker she fell backwards and injured the right leg. Injury not recognized at the time she was returned to her bed. She has complained of persistent pain in the right leg and discomfort with difficulty moving the right leg was brought to the emergency room by her family x-rays demonstrate right proximal metaphyseal diaphyseal tibia-fibular fracture with mild medial displacement of the proximal tibia on the mid tibia about 4-5 mm. Overall alignment is good in both the AP and lateral planes. She has had a history of peripheral vascular disease had a stent placed in the right popliteal artery the right leg may 2012 Dr. Roanna Epley for ulcers that subsequently healed. Also a history of chronic systolic heart failure.   Past Medical History  Diagnosis Date  . Hyperlipidemia   . Hypertension   . Diabetes mellitus   . DVT (deep venous thrombosis)   . End stage renal disease on dialysis   . Stroke   . Dementia     Past Surgical History  Procedure Date  . Dg av dialysis graft declot or   . Av fistula repair Aug. 2013    Left arm    Family History  Problem Relation Age of Onset  . Stroke Mother   . Diabetes Mother   . Cancer  Mother   . Hypertension Mother   . Coronary artery disease Father   . Diabetes Sister     Social History:  reports that she has never smoked. She does not have any smokeless tobacco history on file. She reports that she does not drink alcohol or use illicit drugs.  Allergies: No Known Allergies  Medications:  Prior to Admission:  Prescriptions prior to admission  Medication Sig Dispense Refill  . amLODipine (NORVASC) 5 MG tablet Take 1 tablet by mouth Daily.      Marland Kitchen atorvastatin (LIPITOR) 10 MG tablet Take 1 tablet by mouth Daily.      . carbidopa-levodopa (SINEMET IR) 25-100 MG per tablet Take 1.5 tablets by mouth 3 (three) times daily.       . carvedilol (COREG) 6.25 MG tablet Take 1 tablet by mouth Daily.      Marland Kitchen donepezil (ARICEPT) 10 MG tablet Take 1 tablet by mouth Daily.      . fexofenadine (ALLEGRA) 180 MG tablet Take 180 mg by mouth daily.      Marland Kitchen FLUoxetine (PROZAC) 40 MG capsule Take 1 capsule by mouth Daily.      Marland Kitchen FOSRENOL 500 MG chewable tablet Chew 1 tablet by mouth 3 (three) times daily with meals. Also takes with snacks      . GLIPIZIDE XL 10 MG 24 hr tablet Take 1 tablet by mouth Daily.      . insulin glargine (LANTUS) 100 UNIT/ML injection Inject 4 Units into the skin at bedtime.       Marland Kitchen  insulin lispro (HUMALOG) 100 UNIT/ML injection Inject into the skin 3 (three) times daily before meals. As directed per sliding scale      . levothyroxine (SYNTHROID, LEVOTHROID) 25 MCG tablet Take 25 mcg by mouth every morning.      . loperamide (IMODIUM) 2 MG capsule Take 2 mg by mouth daily as needed. For anti-diarrheal      . multivitamin (RENA-VIT) TABS tablet Take 1 tablet by mouth daily.      Marland Kitchen oxyCODONE-acetaminophen (PERCOCET/ROXICET) 5-325 MG per tablet Take 1 tablet by mouth every 6 (six) hours as needed. For pain      . ramipril (ALTACE) 5 MG capsule Take 5 mg by mouth 2 (two) times daily.      . sevelamer (RENVELA) 800 MG tablet Take 800-1,600 mg by mouth 3 (three) times  daily with meals. Take two with meals and one with snacks      . traMADol (ULTRAM) 50 MG tablet Take 1 tablet (50 mg total) by mouth every 6 (six) hours as needed for pain.  15 tablet  0    Results for orders placed during the hospital encounter of 01/04/12 (from the past 48 hour(s))  TYPE AND SCREEN     Status: Normal   Collection Time   01/04/12  3:50 PM      Component Value Range Comment   ABO/RH(D) O POS      Antibody Screen NEG      Sample Expiration 01/07/2012     ABO/RH     Status: Normal   Collection Time   01/04/12  3:50 PM      Component Value Range Comment   ABO/RH(D) O POS     CBC     Status: Abnormal   Collection Time   01/04/12  3:55 PM      Component Value Range Comment   WBC 8.2  4.0 - 10.5 K/uL    RBC 3.13 (*) 3.87 - 5.11 MIL/uL    Hemoglobin 9.0 (*) 12.0 - 15.0 g/dL    HCT 11.9 (*) 14.7 - 46.0 %    MCV 92.7  78.0 - 100.0 fL    MCH 28.8  26.0 - 34.0 pg    MCHC 31.0  30.0 - 36.0 g/dL    RDW 82.9 (*) 56.2 - 15.5 %    Platelets 339  150 - 400 K/uL   BASIC METABOLIC PANEL     Status: Abnormal   Collection Time   01/04/12  3:55 PM      Component Value Range Comment   Sodium 136  135 - 145 mEq/L    Potassium 4.4  3.5 - 5.1 mEq/L    Chloride 95 (*) 96 - 112 mEq/L    CO2 29  19 - 32 mEq/L    Glucose, Bld 121 (*) 70 - 99 mg/dL    BUN 40 (*) 6 - 23 mg/dL    Creatinine, Ser 1.30 (*) 0.50 - 1.10 mg/dL    Calcium 86.5  8.4 - 10.5 mg/dL    GFR calc non Af Amer 6 (*) >90 mL/min    GFR calc Af Amer 7 (*) >90 mL/min   PROTIME-INR     Status: Normal   Collection Time   01/04/12  3:55 PM      Component Value Range Comment   Prothrombin Time 14.3  11.6 - 15.2 seconds    INR 1.13  0.00 - 1.49     Dg Chest 1 View  01/04/2012  *RADIOLOGY  REPORT*  Clinical Data: Fall.  CHEST - 1 VIEW  Comparison: 01/30/2011  Findings: Cardiac enlargement.  Negative for heart failure. Negative for pneumonia or effusion.  Extensive atherosclerotic calcification in the thoracic aorta.   IMPRESSION: No acute cardiopulmonary disease.   Original Report Authenticated By: Janeece Riggers, M.D.    Dg Pelvis 1-2 Views  01/04/2012  *RADIOLOGY REPORT*  Clinical Data: Fall.  Unresponsive  PELVIS - 1-2 VIEW  Comparison: None  Findings: Negative for fracture of the pelvis or hip.  Hip joints appear normal.  There is generalized osteopenia.  Extensive vascular calcification is present.  IMPRESSION: Negative for fracture.   Original Report Authenticated By: Janeece Riggers, M.D.    Dg Tibia/fibula Right  01/04/2012  *RADIOLOGY REPORT*  Clinical Data: Fall  RIGHT TIBIA AND FIBULA - 2 VIEW  Comparison: None.  Findings: Comminuted and mildly displaced fractures of the proximal tibia and fibula.  The tibial fracture  does not extend into the knee joint.  There is generalized osteopenia.  Atherosclerotic calcification.  There is a stent in the popliteal artery.  IMPRESSION: Comminuted and mildly displaced fractures proximal tibia and fibula.   Original Report Authenticated By: Janeece Riggers, M.D.    Ct Head Wo Contrast  01/04/2012  *RADIOLOGY REPORT*  Clinical Data: Fall.  CT HEAD WITHOUT CONTRAST  Technique:  Contiguous axial images were obtained from the base of the skull through the vertex without contrast.  Comparison:  CT head 12/16/2009  Findings: Moderate atrophy and moderate chronic ischemic changes in the white matter bilaterally.  Chronic infarct left basal ganglia is unchanged.  Negative for acute infarct.  Negative for hemorrhage or mass lesion.  No acute skull abnormality.  Paranasal sinuses are clear.  IMPRESSION: Atrophy and chronic ischemic change.  No acute abnormality.   Original Report Authenticated By: Janeece Riggers, M.D.     @ROS @ Blood pressure 142/78, pulse 100, temperature 99 F (37.2 C), resp. rate 22, SpO2 99.00%. @PHYSEXAMBYAGE2 @  Orthopaedic Exam: Patient is examined on a stretcher in the emergency room supine position right leg turned laterally from the hip down. No gross  deformity of the right leg noted clinically there is mild swelling of the right upper leg. With examination of the right leg bone crepitus noted in the proximal leg. No skin laceration or abrasion noted. Dorsalis pedis pulse 1+ posterior tib pulse trace. No knee or ankle effusion noted. There is noticeable right heel cord contracture unable to dorsiflex the right foot greater than -20 short of neutral. The right knee he is able to be fully extended. The right lower extremity is warm and for calf foot and ankle area. No open wounds over the right foot or heel noted. Left leg with left ankle in equinus and heel cord contracture also about 20 plantar flexion. There is a large left heel decubitus that is stage III with dry black eschar about the left posterior medial heel. This appears clean noninfected and has been treated by the last that long wound center on a chronic basis. Left leg both upper extremities without deformity joint pain or swelling. X-ray right tibia-fibula demonstrates severe calcified arterial vessels the right thigh popliteal and leg areas. There is an oblique fracture of the right proximal tibia metaphyseal diaphyseal area with mild medial displacement of 4 mm approximately 20%. Transverse fracture of the proximal fibula minimally displaced medially. Overall alignment in both AP and lateral planes this good . Assessment/Plan: This 78 year old female with end-stage renal failure and peripheral vascular disease status  post stent of the right popliteal area has a right proximal tibia-fibular fracture that is mildly displaced. She has peripheral vascular disease and significant comorbidities that limit the capacity to perform surgical intervention for treatment of the right fractured tibia so that conservative management with long leg cast is appropriate with intermittent cast change to ensure normal skin integrity. She is at risk of vascular compromise either with surgery and nonsurgical  treatment. Risk involved with this leg is that if she develops open wounds within the cast and she potentially could go on to require an amputation. A similar risk it would be involved in surgery were contemplated. The overall position alignment of the fracture is adequate so that cast treatment is a good option.  Plan: A long leg fiberglass cast applied the right lower extremity from the thigh the right forefoot the right ankle and equinus that is secondary to contracture approximately 20. right knee flexed approximately 35 To align the proximal tibia-to the fracture. Followup x-rays will be obtained. Patient will be admitted to undergo mobilization and assistance transfers from bed to chair bed to wheelchair than likely will be able to return to her home as she has 24 hour nursing. Anticoagulation should be started to prevent further risk of DVT.  Cleon Thoma E 01/04/2012, 5:52 PM

## 2012-01-04 NOTE — Progress Notes (Signed)
Orthopedic Tech Progress Note Patient Details:  Katrina Meyer [birth year] [id number] Knee immobilizer applied to Right LE with care instructions to daughter.   Ortho Devices Type of Ortho Device: Knee Immobilizer Ortho Device/Splint Location: Right Ortho Device/Splint Interventions: Application   Asia R Thompson 01/04/2012, 4:38 PM

## 2012-01-04 NOTE — ED Notes (Signed)
Report given to floor, Heather RN made aware of Dr Otelia Sergeant at bedside with orth tech immobilizing the knee.

## 2012-01-04 NOTE — Progress Notes (Signed)
Orthopedic Tech Progress Note Patient Details:  Katrina Meyer [birth year] [id number] Long Leg cast applied to Right LE with assistance of Dr. Otelia Sergeant. Extra padding around top and bottom to help prevent rubbing. Dr. Otelia Sergeant pleased with casting. Patient to be admitted to hospital overnight.  Casting Type of Cast: Long leg cast Cast Location: Right  Cast Material: Fiberglass Cast Intervention: Application     Asia R Thompson 01/04/2012, 8:30 PM

## 2012-01-04 NOTE — ED Notes (Signed)
Per EMs pt fell yesterday at home, Per EMS family reported pt fell on butt, today pt has obvious deformity to right leg.

## 2012-01-04 NOTE — ED Provider Notes (Addendum)
History     CSN: [id number]  Arrival date & time 01/04/12  1409   First MD Initiated Contact with Patient 01/04/12 1421      Chief Complaint  Patient presents with  . Fall     The history is provided by the EMS personnel and a caregiver. History Limited By: Level V caveat: Dementia.   the patient has a history of dementia and end-stage renal disease.  She fell at home last night and was brought to the emergency department today because of ongoing pain in her right lower extremity.  The patient has dementia and therefore she is unable to provide any additional history.  The majority the history is all pain from EMS/the nurse/family.  The patient has obvious deformity of her right lower extremity.  Of note the patient was conscious.  She usually ambulates with a walker.  She is end-stage renal disease and dialyzes Monday Wednesday Friday with her last dialysis occurring yesterday.  Family reports she's been in her normal state of health until her fall yesterday.  Past Medical History  Diagnosis Date  . Hyperlipidemia   . Hypertension   . Diabetes mellitus   . DVT (deep venous thrombosis)   . End stage renal disease on dialysis   . Stroke   . Dementia     Past Surgical History  Procedure Date  . Dg av dialysis graft declot or   . Av fistula repair Aug. 2013    Left arm    Family History  Problem Relation Age of Onset  . Stroke Mother   . Diabetes Mother   . Cancer Mother   . Hypertension Mother   . Coronary artery disease Father   . Diabetes Sister     History  Substance Use Topics  . Smoking status: Never Smoker   . Smokeless tobacco: Not on file  . Alcohol Use: No    OB History    Grav Para Term Preterm Abortions TAB SAB Ect Mult Living                  Review of Systems  Unable to perform ROS   Allergies  Review of patient's allergies indicates no known allergies.  Home Medications   Current Outpatient Rx  Name  Route  Sig  Dispense  Refill  .  AMLODIPINE BESYLATE 5 MG PO TABS   Oral   Take 1 tablet by mouth Daily.         . ATORVASTATIN CALCIUM 10 MG PO TABS   Oral   Take 1 tablet by mouth Daily.         Marland Kitchen CARBIDOPA-LEVODOPA 25-100 MG PO TABS   Oral   Take 1.5 tablets by mouth 3 (three) times daily.          Marland Kitchen CARVEDILOL 6.25 MG PO TABS   Oral   Take 1 tablet by mouth Daily.         . DONEPEZIL HCL 10 MG PO TABS   Oral   Take 1 tablet by mouth Daily.         Marland Kitchen FEXOFENADINE HCL 180 MG PO TABS   Oral   Take 180 mg by mouth daily.         Marland Kitchen FLUOXETINE HCL 40 MG PO CAPS   Oral   Take 1 capsule by mouth Daily.         Marland Kitchen FOSRENOL 500 MG PO CHEW   Oral   Chew 1 tablet by mouth  3 (three) times daily with meals. Also takes with snacks         . GLIPIZIDE XL 10 MG PO TB24   Oral   Take 1 tablet by mouth Daily.         . INSULIN GLARGINE 100 UNIT/ML [place] SOLN   Subcutaneous   Inject 4 Units into the skin at bedtime.          . INSULIN LISPRO (HUMAN) 100 UNIT/ML  SOLN   Subcutaneous   Inject into the skin 3 (three) times daily before meals. As directed per sliding scale         . LEVOTHYROXINE SODIUM 25 MCG PO TABS   Oral   Take 25 mcg by mouth every morning.         Marland Kitchen LOPERAMIDE HCL 2 MG PO CAPS   Oral   Take 2 mg by mouth daily as needed. For anti-diarrheal         . RENA-VITE PO TABS   Oral   Take 1 tablet by mouth daily.         . OXYCODONE-ACETAMINOPHEN 5-325 MG PO TABS   Oral   Take 1 tablet by mouth every 6 (six) hours as needed. For pain         . RAMIPRIL 5 MG PO CAPS   Oral   Take 5 mg by mouth 2 (two) times daily.         Marland Kitchen SEVELAMER CARBONATE 800 MG PO TABS   Oral   Take 800-1,600 mg by mouth 3 (three) times daily with meals. Take two with meals and one with snacks         . TRAMADOL HCL 50 MG PO TABS   Oral   Take 1 tablet (50 mg total) by mouth every 6 (six) hours as needed for pain.   15 tablet   0     BP 142/78  Pulse 100  Temp 99 F (37.2  C)  Resp 22  SpO2 99%  Physical Exam  Nursing note and vitals reviewed. Constitutional: She is oriented to person, place, and time. She appears well-developed and well-nourished. No distress.  HENT:  Head: Normocephalic and atraumatic.  Eyes: EOM are normal.  Neck: Normal range of motion.  Cardiovascular: Normal rate, regular rhythm and normal heart sounds.   Pulmonary/Chest: Effort normal and breath sounds normal.  Abdominal: Soft. She exhibits no distension. There is no tenderness.  Musculoskeletal: Normal range of motion.  Neurological: She is alert and oriented to person, place, and time.  Skin: Skin is warm and dry.  Psychiatric: She has a normal mood and affect. Judgment normal.    ED Course  Procedures (including critical care time)   Labs Reviewed  CBC  BASIC METABOLIC PANEL  PROTIME-INR  TYPE AND SCREEN   Dg Tibia/fibula Right  01/04/2012  *RADIOLOGY REPORT*  Clinical Data: Fall  RIGHT TIBIA AND FIBULA - 2 VIEW  Comparison: None.  Findings: Comminuted and mildly displaced fractures of the proximal tibia and fibula.  The tibial fracture  does not extend into the knee joint.  There is generalized osteopenia.  Atherosclerotic calcification.  There is a stent in the popliteal artery.  IMPRESSION: Comminuted and mildly displaced fractures proximal tibia and fibula.   Original Report Authenticated By: Janeece Riggers, M.D.    I personally reviewed the imaging tests through PACS system I reviewed available ER/hospitalization records through the EMR   1. Closed right tibial fracture   2. Fall  3. ESRD (end stage renal disease)      Date: 01/04/2012  Rate: 76  Rhythm: afib  QRS Axis: normal  Intervals: normal  ST/T Wave abnormalities: normal  Conduction Disutrbances: none  Narrative Interpretation:   Old EKG Reviewed: afib appears new    MDM  Ortho: Dr Otelia Sergeant [facility]  The patient has a proximal right tibia and fibula fracture.  This is comminuted and  mildly angulated.  There appears surgical evaluate.  The patient will be admitted to the hospitalist service        Lyanne Co, MD 01/04/12 1655  Lyanne Co, MD 01/04/12 1655

## 2012-01-04 NOTE — H&P (Signed)
History and Physical  Katrina Meyer BMW:[id number] DOB: Aug 15, 1932 DOA: 01/04/2012  Referring physician: Dessa Phi PCP: Ignatius Specking., MD   Chief Complaint: fall today  HPI:  Patient is an 78 year old African American female that was brought to the emergency room secondary to fall and experienced a tib-fib fracture. Per discussion with daughter patient was at home she has orthopedics boots on both feet she was trying to turn using the walker and some assistance, she fell. Patient did not lose consciousness. She was brought to the emergency room where she was found to have a tib-fib fracture.  Chart Review:  Old records reviewed  Review of Systems:  Negative otherwise stated in history of present illness  Past Medical History  Diagnosis Date  . Hyperlipidemia   . Hypertension   . Diabetes mellitus   . DVT (deep venous thrombosis)   . End stage renal disease on dialysis hemodialysis Monday Wednesday Friday    . Stroke   . Dementia     Past Surgical History  Procedure Date  . Dg av dialysis graft declot or   . Av fistula repair Aug. 2013    Left arm    Social History: Reports that she has never smoked. She does not have any smokeless tobacco history on file. She reports that she does not drink alcohol or use illicit drugs. She is married with 2 children. She is retired  No Known Allergies  Family History  Problem Relation Age of Onset  . Stroke Mother   . Diabetes Mother   . Cancer Mother   . Hypertension Mother   . Coronary artery disease Father   . Diabetes Sister      Prior to Admission medications   Medication Sig Start Date End Date Taking? Authorizing Provider  amLODipine (NORVASC) 5 MG tablet Take 1 tablet by mouth Daily. 10/02/11   Historical Provider, MD  atorvastatin (LIPITOR) 10 MG tablet Take 1 tablet by mouth Daily. 10/02/11   Historical Provider, MD  carbidopa-levodopa (SINEMET IR) 25-100 MG per tablet Take 1.5 tablets by mouth 3 (three) times daily.   09/26/11   Historical Provider, MD  carvedilol (COREG) 6.25 MG tablet Take 1 tablet by mouth Daily. 10/02/11   Historical Provider, MD  donepezil (ARICEPT) 10 MG tablet Take 1 tablet by mouth Daily. 10/02/11   Historical Provider, MD  fexofenadine (ALLEGRA) 180 MG tablet Take 180 mg by mouth daily.    Historical Provider, MD  FLUoxetine (PROZAC) 40 MG capsule Take 1 capsule by mouth Daily. 10/02/11   Historical Provider, MD  FOSRENOL 500 MG chewable tablet Chew 1 tablet by mouth 3 (three) times daily with meals. Also takes with snacks 09/03/11   Historical Provider, MD  GLIPIZIDE XL 10 MG 24 hr tablet Take 1 tablet by mouth Daily. 10/02/11   Historical Provider, MD  insulin glargine (LANTUS) 100 UNIT/ML injection Inject 4 Units into the skin at bedtime.     Historical Provider, MD  insulin lispro (HUMALOG) 100 UNIT/ML injection Inject into the skin 3 (three) times daily before meals. As directed per sliding scale    Historical Provider, MD  levothyroxine (SYNTHROID, LEVOTHROID) 25 MCG tablet Take 25 mcg by mouth every morning.    Historical Provider, MD  loperamide (IMODIUM) 2 MG capsule Take 2 mg by mouth daily as needed. For anti-diarrheal    Historical Provider, MD  multivitamin (RENA-VIT) TABS tablet Take 1 tablet by mouth daily.    Historical Provider, MD  oxyCODONE-acetaminophen (PERCOCET/ROXICET) 5-325 MG per  tablet Take 1 tablet by mouth every 6 (six) hours as needed. For pain    Historical Provider, MD  ramipril (ALTACE) 5 MG capsule Take 5 mg by mouth 2 (two) times daily.    Historical Provider, MD  sevelamer (RENVELA) 800 MG tablet Take 800-1,600 mg by mouth 3 (three) times daily with meals. Take two with meals and one with snacks    Historical Provider, MD  traMADol (ULTRAM) 50 MG tablet Take 1 tablet (50 mg total) by mouth every 6 (six) hours as needed for pain. 11/26/11   Benny Lennert, MD   Physical Exam: Filed Vitals:   01/04/12 1420 01/04/12 1438  BP:  142/78  Pulse:  100  Temp:   99 F (37.2 C)  Resp:  22  SpO2: 100% 99%     General:  Patient is in bed. She does not seem to be in any acute distress.  HEENT:Head normocephalic atraumatic pupils reactive to light  Cardiovascular: Regular rate rhythm  Respiratory: Clear to auscultation bilaterally  Abdomen: Positive bowel sounds  Skin: Intact  Musculoskeletal: Patient had cast placed on the right lower extremity  Psychiatric: Normal mood  Neurologic: Not tested  Wt Readings from Last 3 Encounters:  11/26/11 79.379 kg (175 lb)  10/29/11 79.379 kg (175 lb)  04/02/11 86.183 kg (190 lb)    Labs on Admission:  Basic Metabolic Panel:  Lab 01/04/12 4098  NA 136  K 4.4  CL 95*  CO2 29  GLUCOSE 121*  BUN 40*  CREATININE 6.00*  CALCIUM 10.4  MG --  PHOS --     CBC:  Lab 01/04/12 1555  WBC 8.2  NEUTROABS --  HGB 9.0*  HCT 29.0*  MCV 92.7  PLT 339      Radiological Exams on Admission: Dg Chest 1 View  01/04/2012  *RADIOLOGY REPORT*  Clinical Data: Fall.  CHEST - 1 VIEW  Comparison: 01/30/2011  Findings: Cardiac enlargement.  Negative for heart failure. Negative for pneumonia or effusion.  Extensive atherosclerotic calcification in the thoracic aorta.  IMPRESSION: No acute cardiopulmonary disease.   Original Report Authenticated By: Janeece Riggers, M.D.    Dg Pelvis 1-2 Views  01/04/2012  *RADIOLOGY REPORT*  Clinical Data: Fall.  Unresponsive  PELVIS - 1-2 VIEW  Comparison: None  Findings: Negative for fracture of the pelvis or hip.  Hip joints appear normal.  There is generalized osteopenia.  Extensive vascular calcification is present.  IMPRESSION: Negative for fracture.   Original Report Authenticated By: Janeece Riggers, M.D.    Dg Tibia/fibula Right  01/04/2012  *RADIOLOGY REPORT*  Clinical Data: Fall  RIGHT TIBIA AND FIBULA - 2 VIEW  Comparison: None.  Findings: Comminuted and mildly displaced fractures of the proximal tibia and fibula.  The tibial fracture  does not extend into the knee  joint.  There is generalized osteopenia.  Atherosclerotic calcification.  There is a stent in the popliteal artery.  IMPRESSION: Comminuted and mildly displaced fractures proximal tibia and fibula.   Original Report Authenticated By: Janeece Riggers, M.D.    Ct Head Wo Contrast  01/04/2012  *RADIOLOGY REPORT*  Clinical Data: Fall.  CT HEAD WITHOUT CONTRAST  Technique:  Contiguous axial images were obtained from the base of the skull through the vertex without contrast.  Comparison:  CT head 12/16/2009  Findings: Moderate atrophy and moderate chronic ischemic changes in the white matter bilaterally.  Chronic infarct left basal ganglia is unchanged.  Negative for acute infarct.  Negative for hemorrhage or mass  lesion.  No acute skull abnormality.  Paranasal sinuses are clear.  IMPRESSION: Atrophy and chronic ischemic change.  No acute abnormality.   Original Report Authenticated By: Janeece Riggers, M.D.     EKG: Independently reviewed.         Assessment/Plan  Tibia/fibula fracture Patient had cast placed per orthopedics. Patient is on heparin for DVT prophylaxis. She'll be discharged home with physical therapy. She has 24-hour care at home.  DM Lantus plus sliding scale insulin  HYPERTENSION Continue home medication blood pressure is stable.  Chronic systolic heart failure Saline lock IV fluid. Patient seem euvolemic.  ESRD Patient has dialysis Mondays Wednesdays Fridays. She already had dialysis yesterday. Patient will be discharged prior to her next hemodialysis  Code Status: Full code  Family Communication: Daughter Disposition Plan/Anticipated LOS: One to 2 days, social work consult for discharge with physical therapy advance home care.  Time spent: 70 minutes  Molli Posey, MD  [facility] Team 3 Pager [phone]. If 7PM-7AM, please contact night-coverage at [email or web address], password [email or web address] 01/04/2012, 5:25 PM

## 2012-01-04 NOTE — Progress Notes (Signed)
Rx Brief Lovenox note   Wt= 79 kg  Scr=6  CrCl ~9 ml/min (N) Adjusted to 30mg  daily for DVT prophylaxis  Katrina Meyer 01/04/2012 6:29 PM

## 2012-01-04 NOTE — ED Notes (Signed)
ZOX:[id number]<AV> Expected date:01/04/12<BR> Expected time: 2:09 PM<BR> Means of arrival:Ambulance<BR> Comments:<BR> Fall/ leg deformity

## 2012-01-04 NOTE — ED Notes (Signed)
Dr Nitka at bedside. 

## 2012-01-05 ENCOUNTER — Inpatient Hospital Stay (HOSPITAL_COMMUNITY): Payer: Medicare Other

## 2012-01-05 DIAGNOSIS — R29818 Other symptoms and signs involving the nervous system: Secondary | ICD-10-CM

## 2012-01-05 DIAGNOSIS — R262 Difficulty in walking, not elsewhere classified: Secondary | ICD-10-CM

## 2012-01-05 LAB — BASIC METABOLIC PANEL
BUN: 50 mg/dL — ABNORMAL HIGH (ref 6–23)
Chloride: 94 mEq/L — ABNORMAL LOW (ref 96–112)
GFR calc Af Amer: 6 mL/min — ABNORMAL LOW (ref >90)
GFR calc non Af Amer: 5 mL/min — ABNORMAL LOW (ref >90)
Potassium: 4.1 mEq/L (ref 3.5–5.1)

## 2012-01-05 LAB — CBC
HCT: 26.7 % — ABNORMAL LOW (ref 36.0–46.0)
Hemoglobin: 8.2 g/dL — ABNORMAL LOW (ref 12.0–15.0)
MCHC: 30.7 g/dL (ref 30.0–36.0)
RDW: 16.7 % — ABNORMAL HIGH (ref 11.5–15.5)
WBC: 6.3 10*3/uL (ref 4.0–10.5)

## 2012-01-05 MED ORDER — PENTAFLUOROPROP-TETRAFLUOROETH EX AERO: 1.0000 "application " | INHALATION_SPRAY | CUTANEOUS | Status: DC | PRN

## 2012-01-05 MED ORDER — NEPRO/CARBSTEADY PO LIQD: 237.0000 mL | ORAL | Status: DC | PRN

## 2012-01-05 MED ORDER — SODIUM CHLORIDE 0.9 % IV SOLN: 100.0000 mL | INTRAVENOUS | Status: DC | PRN

## 2012-01-05 MED ORDER — HYDROCODONE-ACETAMINOPHEN 5-325 MG PO TABS: 1.0000 | ORAL_TABLET | Freq: Four times a day (QID) | ORAL | Status: DC | PRN

## 2012-01-05 MED ORDER — LIDOCAINE-PRILOCAINE 2.5-2.5 % EX CREA: 1.0000 "application " | TOPICAL_CREAM | CUTANEOUS | Status: DC | PRN

## 2012-01-05 MED ORDER — SEVELAMER CARBONATE 0.8 G PO PACK
0.8000 g | PACK | Freq: Three times a day (TID) | ORAL | Status: DC
  Filled 2012-01-05 (×2): qty 1

## 2012-01-05 MED ORDER — NEPRO/CARBSTEADY PO LIQD
237.0000 mL | Freq: Three times a day (TID) | ORAL | Status: DC
  Administered 2012-01-05 – 2012-01-07 (×5): 237 mL via ORAL
  Filled 2012-01-05 (×2): qty 237

## 2012-01-05 MED ORDER — ALTEPLASE 2 MG IJ SOLR: 2.0000 mg | Freq: Once | INTRAMUSCULAR | Status: AC | PRN

## 2012-01-05 MED ORDER — HEPARIN SODIUM (PORCINE) 1000 UNIT/ML DIALYSIS: 1000.0000 [IU] | INTRAMUSCULAR | Status: DC | PRN

## 2012-01-05 MED ORDER — LIDOCAINE HCL (PF) 1 % IJ SOLN: 5.0000 mL | INTRAMUSCULAR | Status: DC | PRN

## 2012-01-05 MED ORDER — SEVELAMER CARBONATE 0.8 G PO PACK: 0.8000 g | PACK | Freq: Two times a day (BID) | ORAL | Status: DC | PRN

## 2012-01-05 MED ORDER — DARBEPOETIN ALFA-POLYSORBATE 200 MCG/0.4ML IJ SOLN
200.0000 ug | INTRAMUSCULAR | Status: DC
  Administered 2012-01-06: 200 ug via INTRAVENOUS
  Filled 2012-01-05: qty 0.4

## 2012-01-05 MED ORDER — SEVELAMER CARBONATE 0.8 G PO PACK
1.6000 g | PACK | Freq: Three times a day (TID) | ORAL | Status: DC
  Administered 2012-01-05: 1.6 g via ORAL
  Filled 2012-01-05 (×2): qty 2

## 2012-01-05 NOTE — Progress Notes (Signed)
Dr. Izola Price paged in reference to plan for patient on behalf of the family.

## 2012-01-05 NOTE — Progress Notes (Signed)
Report called to receiving RN on 6700. Transport arrangements in progress.

## 2012-01-05 NOTE — Progress Notes (Signed)
Patient transferred to [facility] for hemodialysis tomorrow will check on her there in the am.

## 2012-01-05 NOTE — Progress Notes (Signed)
Subjective:     This patient is awake alert oriented x2. Supine in the hospital bed right leg with long-leg casts on several pillows toes are mobile.  Patient reports pain as mild.    Objective:   VITALS:  Temp:  [97.8 F (36.6 C)-99 F (37.2 C)] 98.1 F (36.7 C) (12/01 0651) Pulse Rate:  [72-100] 84  (12/01 0651) Resp:  [18-22] 18  (12/01 0651) BP: (142-172)/(71-82) 153/71 mmHg (12/01 0651) SpO2:  [98 %-100 %] 100 % (12/01 0651)  ABD soft Sensation intact distally Patient is able to move the right foot toes voluntarily. Dose her pain can warmth. Cast is in good condition she is receiving ice packs to the leg. Physical therapy saw today and will work on transfers with the family and the patient. Likely she'll need hemodialysis tomorrow and then discharge posteriorly on dialysis.   LABS  Basename 01/05/12 0555 01/04/12 1555  HGB 8.2* 9.0*  WBC 6.3 8.2  PLT 302 339    Basename 01/05/12 0555 01/04/12 1555  NA 134* 136  K 4.1 4.4  CL 94* 95*  CO2 28 29  BUN 50* 40*  CREATININE 6.98* 6.00*  GLUCOSE 133* 121*    Basename 01/04/12 1555  LABPT --  INR 1.13     Assessment/Plan:      Advance diet Up with therapy Discharge home with home health Expect patient may require some assistance in bed to chair bed to wheelchair transfers and may very well require hemodialysis prior to discharge home. She does have 24 hour nursing care at home and this should be adequate as opposed to a skilled nursing facility. Ayako Tapanes E 01/05/2012, 10:31 AM

## 2012-01-05 NOTE — Progress Notes (Signed)
[facility] progress note. Chief complaint. Transfer note. History of present illness. This 78 year old female admitted to [facility] long hospital post fall and resultant tib-fib fracture. She also has end-stage renal disease and is transferred today to [facility] for her dialysis sessions. Patient has now arrived to [facility] and I am seeing her to ensure she remains stable post transfer and then her orders transferred appropriately. Patient is essentially nonverbal but alert and cooperative. No evidence of distress. Vital signs. Temperature 97.9, pulse 63, respiration 16, blood pressure 106/48. O2 sats 97%. General appearance. Obese elderly female who is alert and in no distress. Cardiac. Rate and rhythm primarily regular with occasional irregular beat. Intermittent split S1. Lungs. Diminished midlung to base bilaterally. No distress. Stable O2 sats. Abdomen. Soft and obese with positive bowel sounds. No pain with palpation. Impression/plan. Problem #1. Tibia/fibular fracture. The patient will continue to be followed by Dr. Otelia Sergeant of orthopedics. Problem #2. End-stage renal disease. Will be available for discharge on her Monday-Wednesday and Friday schedule. Patient appears clinically stable at transfer with no acute complaints. I did answer any questions that the family had this time. All orders appear to of transferred appropriately.

## 2012-01-05 NOTE — Progress Notes (Addendum)
Patient ID: Katrina Meyer, female   DOB: [birth year], 78 y.o.   MRN: [id number]  [facility] PROGRESS NOTE  WELLS GERDEMAN JYN:[id number] DOB: 1933/01/21 DOA: 01/04/2012 PCP: Ignatius Specking., MD  Brief narrative: Patient is a 78 year old African American female that was brought to the emergency room secondary to fall and experienced a tib-fib fracture. Per discussion with daughter patient was at home she has orthopedics boots on both feet she was trying to turn using the walker and some assistance, she fell. Patient did not lose consciousness. She was brought to the emergency room where she was found to have a tib-fib fracture.  Principal Problem:  *Tibia/fibula fracture - no acute need for surgical intervention  - Dr. Otelia Sergeant has seen pt, PT evaluation will be done today and will follow up on recommendations - discharge disposition to be determined based on PT evaluation and the recommendations  - per ortho pt not ready for discharge and will require transfer to [facility] Active Problems:  ESRD - HD MWF, will plan on transferring to [facility] if pt not ready for discharge   DM - stable  - continue Insulin as per home medication regimen  Chronic systolic heart failure - clinically compensated - continue to monitor I's and O's along with daily weights  HYPERTENSION - stable overall but slightly above the target - will continue to monitor vitals per floor protocol - continue Coreg, Norvasc, Rampiril  Consultants:  Ortho  Procedures/Studies: Dg Chest 1 View 01/04/2012   No acute cardiopulmonary disease.     Dg Pelvis 1-2 Views 01/04/2012   Negative for fracture.   Dg Tibia/fibula Right 01/04/2012   Comminuted and mildly displaced fractures proximal tibia and fibula.     Ct Head Wo Contrast 01/04/2012    Atrophy and chronic ischemic change.  No acute abnormality.     Dg Tibia/fibula Right Port 01/05/2012   Fractures of proximal tibia and fibula with mild displacement appearing  similar to the prior study.    Antibiotics:  None  Code Status: Full Family Communication: Daughter at bedside Disposition Plan: Plan to transfer to [facility] as pt requires HD MWF  HPI/Subjective: No events overnight.   Objective: Filed Vitals:   01/04/12 1825 01/04/12 2249 01/05/12 0651 01/05/12 1237  BP: 163/82 172/78 153/71   Pulse: 76 72 84   Temp: 97.9 F (36.6 C) 97.8 F (36.6 C) 98.1 F (36.7 C)   TempSrc:  Oral Oral   Resp:  18 18   Height:    5\' 8"  (1.727 m)  Weight:    85.775 kg (189 lb 1.6 oz)  SpO2: 98% 100% 100%    No intake or output data in the 24 hours ending 01/05/12 1332  Exam:   General:  Pt is somnolent, follows some commands appropriately, not in acute distress  Cardiovascular: Regular rate and rhythm, S1/S2, no murmurs, no rubs, no gallops  Respiratory: Clear to auscultation bilaterally, no wheezing, no crackles, no rhonchi  Abdomen: Soft, non tender, non distended, bowel sounds present, no guarding  Extremities: No edema, pulses DP and PT palpable bilaterally  Neuro: Grossly nonfocal  Data Reviewed: Basic Metabolic Panel:  Lab 01/05/12 8657 01/04/12 1555  NA 134* 136  K 4.1 4.4  CL 94* 95*  CO2 28 29  GLUCOSE 133* 121*  BUN 50* 40*  CREATININE 6.98* 6.00*  CALCIUM 10.0 10.4  MG -- --  PHOS -- --   CBC:  Lab 01/05/12 0555 01/04/12 1555  WBC 6.3 8.2  NEUTROABS -- --  HGB 8.2* 9.0*  HCT 26.7* 29.0*  MCV 93.0 92.7  PLT 302 339   CBG:  Lab 01/05/12 0757 01/04/12 2155 01/04/12 1932  GLUCAP 122* 162* 93   Scheduled Meds:   . amLODipine  5 mg Oral Daily  . atorvastatin  10 mg Oral q1800  . carbidopa-levodopa  1.5 tablet Oral TID  . carvedilol  6.25 mg Oral BID WC  . donepezil  10 mg Oral QHS  . enoxaparin injection  30 mg Subcutaneous Q24H  . feeding supplement   237 mL Oral TID WC  . FLUoxetine  40 mg Oral Daily  . insulin aspart  0-15 Units Subcutaneous TID WC  . insulin glargine  4 Units Subcutaneous QHS  . lanthanum   500 mg Oral TID WC  . levothyroxine  25 mcg Oral q morning - 10a  . loratadine  10 mg Oral Daily  . multivitamin   1 tablet Oral Daily  . ramipril  5 mg Oral BID  . sevelamer  1,600 mg Oral TID WC   Continuous Infusions:    Debbora Presto, MD  [facility] Pager [phone]  If 7PM-7AM, please contact night-coverage [email or web address] Password [email or web address] 01/05/2012, 1:32 PM   LOS: 1 day

## 2012-01-05 NOTE — Evaluation (Signed)
Physical Therapy Evaluation Patient Details Name: Katrina Meyer MRN: [id number] DOB: 26-Jun-1932 Today's Date: 01/05/2012 Time: 4098-1191 PT Time Calculation (min): 25 min  PT Assessment / Plan / Recommendation Clinical Impression  Pt with multiple medical problems now admitted with proximal tibia/fibula fracture.  Pt has limited ROM in non fractured ankle and knee  and dementia that impairs mobility.  Pain is a limiting factor.  Pt is in a long leg cast that will limit her ability to get in and out of a car to get to dialysis.  Anticipate she will initially need hoyer lift to chair at home and at dialysis and medical transport to and from dialysis (hopefully  in wheelchair. ) Daughter is [place] trying to fgure out details both at home and at outpatient HD facility.    PT Assessment  Patient needs continued PT services    Follow Up Recommendations  Home health PT;SNF;Supervision/Assistance - 24 hour (depends on family's decision)    Does the patient have the potential to tolerate intense rehabilitation      Barriers to Discharge   pt will need to get out of house and do car transfers 3 days a week to go to dialysis and, once there, transfer in and out of HD chair, Pain,   NWB,  and long leg cast with knee flexed impair mobility    Equipment Recommendations  None recommended by PT    Recommendations for Other Services     Frequency Min 3X/week    Precautions / Restrictions Precautions Precautions: Fall Restrictions Weight Bearing Restrictions: Yes RLE Weight Bearing: Non weight bearing Other Position/Activity Restrictions: pt in long leg cast on right leg with flexion at knee   Pertinent Vitals/Pain Pt expresses severe pain whenever RLE cast is moved      Mobility  Bed Mobility Bed Mobility: Supine to Sit;Sit to Supine Supine to Sit: 1: +2 Total assist Supine to Sit: Patient Percentage: 10% Sit to Supine: 1: +2 Total assist Sit to Supine: Patient Percentage: 10% Details  for Bed Mobility Assistance: pt very anxious with any attempt to move her.  She was brought to sitting on EOB with 2 people and was able to maintain sitting, but had increased pain in leg while it was in dependent position Pt was unable to follow commands to attempt to assist with tranfers Transfers Details for Transfer Assistance: Pt unable to attempt to stand due to pain in right leg, plantar and knee flexion of left leg and inability to follow commands Ambulation/Gait Ambulation/Gait Assistance: Not tested (comment) General Gait Details: pt uanble to attempt    Shoulder Instructions     Exercises     PT Diagnosis: Acute pain;Difficulty walking;Generalized weakness;Abnormality of gait  PT Problem List: Decreased strength;Decreased range of motion;Decreased activity tolerance;Decreased mobility;Decreased cognition;Decreased safety awareness;Decreased knowledge of precautions;Pain PT Treatment Interventions: Functional mobility training;Therapeutic activities;Therapeutic exercise;Balance training;Patient/family education   PT Goals Acute Rehab PT Goals PT Goal Formulation: With patient/family Time For Goal Achievement: 01/19/12 Potential to Achieve Goals: Fair Pt will go Supine/Side to Sit: with min assist PT Goal: Supine/Side to Sit - Progress: Goal set today Pt will go Sit to Supine/Side: with min assist PT Goal: Sit to Supine/Side - Progress: Goal set today Pt will Transfer Bed to Chair/Chair to Bed: with +2 total assist;Other (comment) (with transfer bed and pt doing 50%) PT Transfer Goal: Bed to Chair/Chair to Bed - Progress: Goal set today  Visit Information  Last PT Received On: 01/05/12 Reason Eval/Treat Not Completed:  Pain limiting ability to participate    Subjective Data  Subjective: pt mostly non verbal, but will engage with eye contact an smile and indicate when she is in pain Patient Stated Goal: daughter states goal is to go home and return to dialysis 3 days per  week   Prior Functioning  Home Living Lives With: Spouse Available Help at Discharge: Personal care attendant;Available 24 hours/day Type of Home: House Home Access: Level entry Home Layout: One level Home Adaptive Equipment: Dan Humphreys - four wheeled;Walker - rolling Additional Comments: Daughter very aware of medical equipment available and what might best suits patient needs Prior Function Level of Independence: Needs assistance Communication Communication: Other (comment) (difficult to assess due to  dementia)    Cognition  Overall Cognitive Status: Impaired Area of Impairment: Following commands;Awareness of errors;Awareness of deficits;Memory Arousal/Alertness: Awake/alert Behavior During Session: Anxious (anxious due to cast on right leg and pain)    Extremity/Trunk Assessment Right Lower Extremity Assessment RLE ROM/Strength/Tone: Deficits RLE ROM/Strength/Tone Deficits: pt with muscle atrophy.  Pt in long leg cast with flexion at knee, plantarflexion. Pt expressed anxiety with any attempt to move right leg Left Lower Extremity Assessment LLE ROM/Strength/Tone: Deficits LLE ROM/Strength/Tone Deficits: plantar flexion contracture.  pt appears to lack full extension at knee in supine.  In sitting, she kept knee at 90 degree flexion and would not allow it to be extended   Balance Balance Balance Assessed: Yes Static Sitting Balance Static Sitting - Balance Support: Feet unsupported;No upper extremity supported Static Sitting - Level of Assistance: 5: Stand by assistance Static Sitting - Comment/# of Minutes: > 2 minutes  End of Session PT - End of Session Activity Tolerance: Patient limited by pain (nurse provided pain med prior to PT, pt still with pain)  GP     Bayard Hugger. [place], [place] [phone] 01/05/2012, 2:33 PM

## 2012-01-05 NOTE — Progress Notes (Signed)
Patient is pocketing food and medications. It was very difficult to get morning medications in even with crushing what could be crushed or breaking tablets in half. I will attempt to administer the rest of her medications, but will hold if she is not swallowing.

## 2012-01-05 NOTE — Consult Note (Addendum)
[facility] Renal Consultation Note  Indication for Consultation:  Management of ESRD/hemodialysis; anemia, hypertension/volume and secondary hyperparathyroidism  HPI: Katrina Meyer is a 78 y.o. black woman with ESRD--on dialysis on MWF at [facility] in [place] who came to the [facility] emergency room yesterday after a fall and was found to have a mildly displaced right proximal one-third tibia-fibula fracture.  She fell very early on Saturday morning and returned to bed, but continued to have significant pain and difficulty using right leg before going to the ER in the afternoon, where she was seen by Dr. Vira Browns.  She has peripheral vascular disease and had a stent placed in the right popliteal artery 06/2010 by Dr. Fabienne Bruns, but is limited to conservative treatment with a long-leg fiberglass cast per Dr. Otelia Sergeant rather than surgical intervention to avoid vascular compromise.  She will be transferred to [facility] today to receive her dialysis tomorrow.   Her daughter states that the patient has a fistulogram scheduled at [facility] on 12/3 to evaluate her left forearm fistula and a swallow study, also believed to be on 12/3, but at [facility], to evaluate dysphagia.   In addition, her daughter would like very much to transfer Katrina Meyer to the [facility] dialysis center in [place] and be followed by [facility] [employer].  The patient is currently comfortable without complaints.  Dialysis Orders: Center: [facility] in [place] on MWF. Outpatient orders pending.  Past Medical History  Diagnosis Date  . Hyperlipidemia   . Hypertension   . Diabetes mellitus   . DVT (deep venous thrombosis)   . End stage renal disease on dialysis   . Stroke   . Dementia   . Bone fracture 01/04/12    Fracture- rt tib fib   Past Surgical History  Procedure Date  . Dg av dialysis graft declot or   . Av fistula repair Aug. 2013    Left arm   Family History  Problem Relation  Age of Onset  . Stroke Mother   . Diabetes Mother   . Cancer Mother   . Hypertension Mother   . Coronary artery disease Father   . Diabetes Sister    Social History She denies any history of tobacco, alcohol, or illicit drug use.  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  No Known Allergies Prior to Admission medications   Medication Sig Start Date End Date Taking? Authorizing Provider  amLODipine (NORVASC) 5 MG tablet Take 1 tablet by mouth Daily. 10/02/11  Yes Historical Provider, MD  atorvastatin (LIPITOR) 10 MG tablet Take 1 tablet by mouth Daily. 10/02/11  Yes Historical Provider, MD  carbidopa-levodopa (SINEMET IR) 25-100 MG per tablet Take 1.5 tablets by mouth 3 (three) times daily.  09/26/11  Yes Historical Provider, MD  carvedilol (COREG) 6.25 MG tablet Take 1 tablet by mouth Daily. 10/02/11  Yes Historical Provider, MD  donepezil (ARICEPT) 10 MG tablet Take 1 tablet by mouth Daily. 10/02/11  Yes Historical Provider, MD  fexofenadine (ALLEGRA) 180 MG tablet Take 180 mg by mouth daily.   Yes Historical Provider, MD  FLUoxetine (PROZAC) 40 MG capsule Take 1 capsule by mouth Daily. 10/02/11  Yes Historical Provider, MD  FOSRENOL 500 MG chewable tablet Chew 1 tablet by mouth 3 (three) times daily with meals. Also takes with snacks 09/03/11  Yes Historical Provider, MD  GLIPIZIDE XL 10 MG 24 hr  tablet Take 1 tablet by mouth Daily. 10/02/11  Yes Historical Provider, MD  insulin glargine (LANTUS) 100 UNIT/ML injection Inject 4 Units into the skin at bedtime.    Yes Historical Provider, MD  insulin lispro (HUMALOG) 100 UNIT/ML injection Inject into the skin 3 (three) times daily before meals. As directed per sliding scale   Yes Historical Provider, MD  levothyroxine (SYNTHROID, LEVOTHROID) 25 MCG tablet Take 25 mcg by mouth every morning.   Yes Historical Provider, MD  loperamide  (IMODIUM) 2 MG capsule Take 2 mg by mouth daily as needed. For anti-diarrheal   Yes Historical Provider, MD  multivitamin (RENA-VIT) TABS tablet Take 1 tablet by mouth daily.   Yes Historical Provider, MD  oxyCODONE-acetaminophen (PERCOCET/ROXICET) 5-325 MG per tablet Take 1 tablet by mouth every 6 (six) hours as needed. For pain   Yes Historical Provider, MD  ramipril (ALTACE) 5 MG capsule Take 5 mg by mouth 2 (two) times daily.   Yes Historical Provider, MD  sevelamer (RENVELA) 800 MG tablet Take 800-1,600 mg by mouth 3 (three) times daily with meals. Take two with meals and one with snacks   Yes Historical Provider, MD  HYDROcodone-acetaminophen (NORCO) 5-325 MG per tablet Take 1-2 tablets by mouth every 6 (six) hours as needed for pain. MAXIMUM TOTAL ACETAMINOPHEN DOSE IS 4000 MG PER DAY 01/05/12   Kerrin Champagne, MD   Labs:  Results for orders placed during the hospital encounter of 01/04/12 (from the past 48 hour(s))  TYPE AND SCREEN     Status: Normal   Collection Time   01/04/12  3:50 PM      Component Value Range Comment   ABO/RH(D) O POS      Antibody Screen NEG      Sample Expiration 01/07/2012     ABO/RH     Status: Normal   Collection Time   01/04/12  3:50 PM      Component Value Range Comment   ABO/RH(D) O POS     CBC     Status: Abnormal   Collection Time   01/04/12  3:55 PM      Component Value Range Comment   WBC 8.2  4.0 - 10.5 K/uL    RBC 3.13 (*) 3.87 - 5.11 MIL/uL    Hemoglobin 9.0 (*) 12.0 - 15.0 g/dL    HCT 47.8 (*) 29.5 - 46.0 %    MCV 92.7  78.0 - 100.0 fL    MCH 28.8  26.0 - 34.0 pg    MCHC 31.0  30.0 - 36.0 g/dL    RDW 62.1 (*) 30.8 - 15.5 %    Platelets 339  150 - 400 K/uL   BASIC METABOLIC PANEL     Status: Abnormal   Collection Time   01/04/12  3:55 PM      Component Value Range Comment   Sodium 136  135 - 145 mEq/L    Potassium 4.4  3.5 - 5.1 mEq/L    Chloride 95 (*) 96 - 112 mEq/L    CO2 29  19 - 32 mEq/L    Glucose, Bld 121 (*) 70 - 99 mg/dL      BUN 40 (*) 6 - 23 mg/dL    Creatinine, Ser 6.57 (*) 0.50 - 1.10 mg/dL    Calcium 84.6  8.4 - 10.5 mg/dL    GFR calc non Af Amer 6 (*) >90 mL/min    GFR calc Af Amer 7 (*) >90 mL/min   PROTIME-INR  Status: Normal   Collection Time   01/04/12  3:55 PM      Component Value Range Comment   Prothrombin Time 14.3  11.6 - 15.2 seconds    INR 1.13  0.00 - 1.49   HEMOGLOBIN A1C     Status: Abnormal   Collection Time   01/04/12  3:55 PM      Component Value Range Comment   Hemoglobin A1C 6.7 (*) <5.7 %    Mean Plasma Glucose 146 (*) <117 mg/dL   GLUCOSE, CAPILLARY     Status: Normal   Collection Time   01/04/12  7:32 PM      Component Value Range Comment   Glucose-Capillary 93  70 - 99 mg/dL   GLUCOSE, CAPILLARY     Status: Abnormal   Collection Time   01/04/12  9:55 PM      Component Value Range Comment   Glucose-Capillary 162 (*) 70 - 99 mg/dL   BASIC METABOLIC PANEL     Status: Abnormal   Collection Time   01/05/12  5:55 AM      Component Value Range Comment   Sodium 134 (*) 135 - 145 mEq/L    Potassium 4.1  3.5 - 5.1 mEq/L    Chloride 94 (*) 96 - 112 mEq/L    CO2 28  19 - 32 mEq/L    Glucose, Bld 133 (*) 70 - 99 mg/dL    BUN 50 (*) 6 - 23 mg/dL    Creatinine, Ser 8.41 (*) 0.50 - 1.10 mg/dL    Calcium 32.4  8.4 - 10.5 mg/dL    GFR calc non Af Amer 5 (*) >90 mL/min    GFR calc Af Amer 6 (*) >90 mL/min   CBC     Status: Abnormal   Collection Time   01/05/12  5:55 AM      Component Value Range Comment   WBC 6.3  4.0 - 10.5 K/uL    RBC 2.87 (*) 3.87 - 5.11 MIL/uL    Hemoglobin 8.2 (*) 12.0 - 15.0 g/dL    HCT 40.1 (*) 02.7 - 46.0 %    MCV 93.0  78.0 - 100.0 fL    MCH 28.6  26.0 - 34.0 pg    MCHC 30.7  30.0 - 36.0 g/dL    RDW 25.3 (*) 66.4 - 15.5 %    Platelets 302  150 - 400 K/uL   GLUCOSE, CAPILLARY     Status: Abnormal   Collection Time   01/05/12  7:57 AM      Component Value Range Comment   Glucose-Capillary 122 (*) 70 - 99 mg/dL   GLUCOSE, CAPILLARY     Status:  Normal   Collection Time   01/05/12  1:42 PM      Component Value Range Comment   Glucose-Capillary 75  70 - 99 mg/dL    Constitutional: negative for chills, fatigue, fevers and sweats Ears, nose, mouth, throat, and face: negative for hearing loss, hoarseness, nasal congestion and sore throat Respiratory: negative for cough, dyspnea on exertion, hemoptysis, sputum and wheezing Cardiovascular: negative for chest pain, chest pressure/discomfort, orthopnea and palpitations Gastrointestinal: negative for abdominal pain, change in bowel habits, nausea and vomiting Genitourinary:negative, anuric Musculoskeletal:positive for right lower leg pain, negative for back pain, myalgias and neck pain Neurological: negative for dizziness, gait problems, headaches and speech problems  Physical Exam: Filed Vitals:   01/05/12 1541  BP: 112/66  Pulse: 70  Temp: 98.7 F (37.1 C)  Resp:  16     General appearance: alert, cooperative and no distress Head: Normocephalic, without obvious abnormality, atraumatic Neck: no adenopathy, no carotid bruit, no JVD and supple, symmetrical, trachea midline Resp: clear to auscultation bilaterally Cardio: RRR with Gr I/VI systolic murmur, no rub GI: soft, non-tender; bowel sounds normal; no masses,  no organomegaly Extremities: fiberglass cast from right thigh to forefoot, no edema on left Neurologic: Grossly normal Dialysis Access: AVF @ LFA with + bruit   Assessment/Plan: 1. Right proximal tibia-fibular fx - secondary to fall early 11/30; conservative treatment with long-leg fiberglass cast per Dr. Otelia Sergeant, will undergo mobilization and assistance transfers to prepare for return home where she has 24-hr nursing.  2. ESRD -  HD on MWF @ [facility] in [place], but daughter wants pt to transfer to [facility] in [place]; K 4.1 today.  Transfer to [facility] for dialysis tomorrow.  Obtain outpatient information. 3. Hypertension/volume  - BP 112/66, most recently on Amlodipine 5  mg qd, Coreg 6.25 mg bid, and Ramipril 5 mg bid; current wt 85.8 kg, but no signs or symptoms of fluid overload, chest x-ray negative. 4. Anemia  - Hgb down to 8.2 (9 yesterday).  Aranesp 200 mcg tomorrow, check CBC and Fe studies.  5. Metabolic bone disease -  Ca 10, last P 6.5 on 11/6; on Renvela 1600 mg, Fosrenol 500 mg with meals. 6. Nutrition - Last Alb 3.2 on 11/6. 7. DM Type 2 - on Insulin. 8. PVD - s/p angioplasty and stent placement at right popliteal artery 06/2010 by Dr. Darrick Penna.  [name] daughter may be reached at [phone].  LYLES,CHARLES 01/05/2012, 3:55 PM   Attending Nephrologist: Marina Gravel, MD KatrinaUhlig was admitted yesterday with a broken R leg.  Orthopedics does not plan surgical repair.  She is usually on dialysis at the [facility] dialysis center (MWF).  Dialysis is scheduled for tomorrow. According to Mrs. Haider's daughter, Mrs. Avery was scheduled for a fistulagram at [facility] (will need to fiind out reason for study).   In addition, daughter would like for [facility]to take over her care and transfer her to [facility] in [place].  Mrs. Naples's daughter says she comes to [facility] for most of her hospitalizations and we should be more familiar with her care.  In addition, she is friends with Dr. Lowell Guitar and his wife. Will check Fe studies in dialysis tomorrow (hgb 8.2)   I agree with plans as outlined above by C. Lyles, PA-C

## 2012-01-06 DIAGNOSIS — R1310 Dysphagia, unspecified: Secondary | ICD-10-CM | POA: Diagnosis present

## 2012-01-06 DIAGNOSIS — D649 Anemia, unspecified: Secondary | ICD-10-CM

## 2012-01-06 LAB — CBC
Hemoglobin: 7.9 g/dL — ABNORMAL LOW (ref 12.0–15.0)
MCH: 28.3 pg (ref 26.0–34.0)
Platelets: 305 10*3/uL (ref 150–400)
RBC: 2.79 MIL/uL — ABNORMAL LOW (ref 3.87–5.11)
WBC: 7.4 10*3/uL (ref 4.0–10.5)

## 2012-01-06 LAB — GLUCOSE, CAPILLARY
Glucose-Capillary: 133 mg/dL — ABNORMAL HIGH (ref 70–99)
Glucose-Capillary: 304 mg/dL — ABNORMAL HIGH (ref 70–99)
Glucose-Capillary: 171 mg/dL — ABNORMAL HIGH (ref 70–99)

## 2012-01-06 LAB — RENAL FUNCTION PANEL
CO2: 26 mEq/L (ref 19–32)
Calcium: 9.9 mg/dL (ref 8.4–10.5)
Chloride: 95 mEq/L — ABNORMAL LOW (ref 96–112)
GFR calc Af Amer: 4 mL/min — ABNORMAL LOW (ref >90)
GFR calc non Af Amer: 4 mL/min — ABNORMAL LOW (ref >90)
Glucose, Bld: 291 mg/dL — ABNORMAL HIGH (ref 70–99)
Potassium: 4.2 mEq/L (ref 3.5–5.1)
Sodium: 137 mEq/L (ref 135–145)

## 2012-01-06 LAB — HEPATITIS B SURFACE ANTIGEN: Hepatitis B Surface Ag: NEGATIVE

## 2012-01-06 MED ORDER — SEVELAMER CARBONATE 0.8 G PO PACK
1.6000 g | PACK | Freq: Three times a day (TID) | ORAL | Status: DC
  Administered 2012-01-06 – 2012-01-07 (×4): 1.6 g via ORAL
  Filled 2012-01-06 (×6): qty 2

## 2012-01-06 MED ORDER — DARBEPOETIN ALFA-POLYSORBATE 200 MCG/0.4ML IJ SOLN
INTRAMUSCULAR | Status: AC
  Administered 2012-01-06: 200 ug via INTRAVENOUS
  Filled 2012-01-06: qty 0.4

## 2012-01-06 MED ORDER — PARICALCITOL 5 MCG/ML IV SOLN: 6.0000 ug | INTRAVENOUS | Status: DC

## 2012-01-06 NOTE — Progress Notes (Signed)
SLP Cancellation Note  Patient Details Name: Katrina Meyer MRN: [id number] DOB: May 19, 1932   Cancelled treatment:        Attempted to see patient for Bedside Swallow Evaluation, however, patient in HD and RN currently working with her.  Did speak with daughter, who reports an ongoing oral phase dysphagia, that is gradually worsening, (pocketing of food and pills).  SLP will return later today if schedule allows, or 12/3 am at the latest.   Maryjo Rochester T 01/06/2012, 1:24 PM

## 2012-01-06 NOTE — Progress Notes (Signed)
OT CANCELLATION NOTICE Attempted to see pt. Pt in HD. Will see tomorrow. [facility], OTR/L  [phone] 01/06/2012

## 2012-01-06 NOTE — Progress Notes (Signed)
[facility] PROGRESS NOTE  VIVIANE GARATE YNW:[id number] DOB: [birth year] DOA: 01/04/2012 PCP: Ignatius Specking., MD   HPI:  Katrina Meyer is a 78 y.o. African American woman with ESRD--on dialysis on MWF at [facility] in [place] who came to the [facility] emergency room on 11/30 after a fall and was found to have a mildly displaced right proximal one-third tibia-fibula fracture. She fell very early on Saturday morning and returned to bed, but continued to have significant pain and difficulty using right leg before going to the ER in the afternoon, where she was seen by Dr. Vira Browns. She has peripheral vascular disease and had a stent placed in the right popliteal artery 06/2010 by Dr. Fabienne Bruns, but is limited to conservative treatment with a long-leg fiberglass cast per Dr. Otelia Sergeant rather than surgical intervention to avoid vascular compromise. She was transferred to [facility] on 12/1 to receive her dialysis on 12/2.  Her daughter states that the patient has a fistulogram scheduled at [facility] on 12/3 to evaluate her left forearm fistula and a swallow study, also believed to be on 12/3, but at [facility], to evaluate dysphagia.  Assessment/Plan: 1. Right proximal tibia/fibular fracture, secondary to fall on 11/30: Conservative treatment with long leg fiberglass cast per Dr. Otelia Sergeant. Ortho PA has discussed at length with patient's family regarding their concerns today (refer to ortho notes). No significant pain. Outpatient followup with Dr. Weston Brass in 2 weeks after discharge. 2. ESRD: HD on MWF. Nephrology following. For HD on 12/2. Nephrology arranging for transfer of outpatient dialysis to a different center at family's request. Further evaluation of dialysis access deferred to nephrology. 3. ? Intermittent dysphagia: Was supposed to have some kind of evaluation at [facility] Will request speech therapy consultation. Tolerated muffin and juice today. Intermittently said to  pocket foods in her mouth. 4. Anemia: Gradually dropping hemoglobin but no overt bleeding. Likely secondary to CKD. Monitor CBCs closely. Continue Aranesp per nephrology. 5. Hypertension: Controlled. Continue Coreg, Norvasc and ramipril. 6. Diabetes: Reasonable inpatient control. 7. Chronic systolic congestive heart failure: Clinically compensated. 8. Hypothyroid: Continue Synthroid 9. History of Parkinson's: Continue Sinemet.  Code Status: Full Family Communication: Discussed with patient's son Mr. San Jetty and daughter Ms. Lattie Haw at bedside. Disposition Plan: Home when medically stable.   Consultants:  Orthopedics  Nephrology  Procedures:  Right lower extremity long leg fiberglass cast.  Antibiotics:  None (indicate start date, and stop date if known)  HPI/Subjective: Denies pain. Not much history available from patient. History provided by patient's children.  Objective: Filed Vitals:   01/06/12 1311 01/06/12 1328 01/06/12 1330 01/06/12 1400  BP: 120/48 128/54 134/49 103/49  Pulse: 73 73 65 65  Temp: 98.4 F (36.9 C) 98.4 F (36.9 C)    TempSrc: Oral Oral    Resp: 16 19    Height:      Weight:      SpO2: 98% 98%      Intake/Output Summary (Last 24 hours) at 01/06/12 1414 Last data filed at 01/06/12 1400  Gross per 24 hour  Intake    660 ml  Output      0 ml  Net    660 ml   Filed Weights   01/05/12 1237 01/05/12 2034  Weight: 85.775 kg (189 lb 1.6 oz) 85.775 kg (189 lb 1.6 oz)    Exam:   General exam: Appears comfortable.  Respiratory system: Clear to auscultation. No increased work of breathing.  Cardiovascular system: First and second heart sounds heard, regular rate and rhythm. No JVD or murmurs or gallops. Telemetry shows normal sinus rhythm with occasional PACs.  Gastrointestinal system: Abdomen is nondistended, soft and nontender. Normal bowel sounds heard.  Central nervous system: Alert and oriented only to self. No focal  neurological deficits.  Extremities: Right lower extremity in long-leg fiberglass cast from forefoot to upper thigh. Able to wiggle toes. Approximately 3 cm diameter darkened and hardened skin medial aspect of left heel.  Data Reviewed: Basic Metabolic Panel:  Lab 01/05/12 1610 01/04/12 1555  NA 134* 136  K 4.1 4.4  CL 94* 95*  CO2 28 29  GLUCOSE 133* 121*  BUN 50* 40*  CREATININE 6.98* 6.00*  CALCIUM 10.0 10.4  MG -- --  PHOS -- --   Liver Function Tests: No results found for this basename: AST:5,ALT:5,ALKPHOS:5,BILITOT:5,PROT:5,ALBUMIN:5 in the last 168 hours No results found for this basename: LIPASE:5,AMYLASE:5 in the last 168 hours No results found for this basename: AMMONIA:5 in the last 168 hours CBC:  Lab 01/06/12 1340 01/05/12 0555 01/04/12 1555  WBC 7.4 6.3 8.2  NEUTROABS -- -- --  HGB 7.9* 8.2* 9.0*  HCT 25.4* 26.7* 29.0*  MCV 91.0 93.0 92.7  PLT 305 302 339   Cardiac Enzymes: No results found for this basename: CKTOTAL:5,CKMB:5,CKMBINDEX:5,TROPONINI:5 in the last 168 hours BNP (last 3 results) No results found for this basename: PROBNP:3 in the last 8760 hours CBG:  Lab 01/06/12 1149 01/06/12 0729 01/05/12 2031 01/05/12 1733 01/05/12 1342  GLUCAP 304* 133* 131* 122* 75    No results found for this or any previous visit (from the past 240 hour(s)).   Studies: Dg Chest 1 View  01/04/2012  *RADIOLOGY REPORT*  Clinical Data: Fall.  CHEST - 1 VIEW  Comparison: 01/30/2011  Findings: Cardiac enlargement.  Negative for heart failure. Negative for pneumonia or effusion.  Extensive atherosclerotic calcification in the thoracic aorta.  IMPRESSION: No acute cardiopulmonary disease.   Original Report Authenticated By: Janeece Riggers, M.D.    Dg Pelvis 1-2 Views  01/04/2012  *RADIOLOGY REPORT*  Clinical Data: Fall.  Unresponsive  PELVIS - 1-2 VIEW  Comparison: None  Findings: Negative for fracture of the pelvis or hip.  Hip joints appear normal.  There is generalized  osteopenia.  Extensive vascular calcification is present.  IMPRESSION: Negative for fracture.   Original Report Authenticated By: Janeece Riggers, M.D.    Dg Tibia/fibula Right  01/04/2012  *RADIOLOGY REPORT*  Clinical Data: Fall  RIGHT TIBIA AND FIBULA - 2 VIEW  Comparison: None.  Findings: Comminuted and mildly displaced fractures of the proximal tibia and fibula.  The tibial fracture  does not extend into the knee joint.  There is generalized osteopenia.  Atherosclerotic calcification.  There is a stent in the popliteal artery.  IMPRESSION: Comminuted and mildly displaced fractures proximal tibia and fibula.   Original Report Authenticated By: Janeece Riggers, M.D.    Ct Head Wo Contrast  01/04/2012  *RADIOLOGY REPORT*  Clinical Data: Fall.  CT HEAD WITHOUT CONTRAST  Technique:  Contiguous axial images were obtained from the base of the skull through the vertex without contrast.  Comparison:  CT head 12/16/2009  Findings: Moderate atrophy and moderate chronic ischemic changes in the white matter bilaterally.  Chronic infarct left basal ganglia is unchanged.  Negative for acute infarct.  Negative for hemorrhage or mass lesion.  No acute skull abnormality.  Paranasal sinuses are clear.  IMPRESSION: Atrophy and chronic ischemic change.  No acute abnormality.   Original Report Authenticated By: Janeece Riggers, M.D.    Dg Tibia/fibula Right Port  01/05/2012  *RADIOLOGY REPORT*  Clinical Data: Post reduction  PORTABLE RIGHT TIBIA AND FIBULA - 2 VIEW  Comparison: 01/04/2012  Findings: Fracture of the proximal tibia and fibula with mild displacement.  An oblique and lateral view were obtained.  AP view was not obtained.  Cast is in place.  IMPRESSION: Fractures of proximal tibia and fibula with mild displacement appearing similar to the prior study.   Original Report Authenticated By: Janeece Riggers, M.D.     Scheduled Meds:    . amLODipine  5 mg Oral Daily  . atorvastatin  10 mg Oral q1800  . carbidopa-levodopa   1.5 tablet Oral TID  . carvedilol  6.25 mg Oral BID WC  . darbepoetin      . darbepoetin (ARANESP) injection - DIALYSIS  200 mcg Intravenous Q Mon-HD  . donepezil  10 mg Oral QHS  . enoxaparin (LOVENOX) injection  30 mg Subcutaneous Q24H  . feeding supplement (NEPRO CARB STEADY)  237 mL Oral TID WC  . FLUoxetine  40 mg Oral Daily  . insulin aspart  0-15 Units Subcutaneous TID WC  . insulin glargine  4 Units Subcutaneous QHS  . lanthanum  500 mg Oral TID WC  . levothyroxine  25 mcg Oral q morning - 10a  . loratadine  10 mg Oral Daily  . multivitamin with minerals  1 tablet Oral Daily  . ramipril  5 mg Oral BID  . sevelamer carbonate  1.6 g Oral TID WC  . sodium chloride  3 mL Intravenous Q12H  . [DISCONTINUED] sevelamer carbonate  1.6 g Oral TID WC   Continuous Infusions:   Principal Problem:  *Tibia/fibula fracture Active Problems:  DM  HYPERTENSION  Chronic systolic heart failure  ESRD    Time spent: 30 minutes    [facility]  [facility] Pager [phone]. If 8PM-8AM, please contact night-coverage at [email or web address], password [facility] 01/06/2012, 2:14 PM  LOS: 2 days

## 2012-01-06 NOTE — Evaluation (Signed)
Clinical/Bedside Swallow Evaluation Patient Details  Name: Katrina Meyer MRN: [id number] Date of Birth: [birth year]  Today's Date: 01/06/2012 Time: 4098-1191 SLP Time Calculation (min): 18 min  Past Medical History:  Past Medical History  Diagnosis Date  . Hyperlipidemia   . Hypertension   . Diabetes mellitus   . DVT (deep venous thrombosis)   . End stage renal disease on dialysis   . Stroke   . Dementia   . Bone fracture 01/04/12    Fracture- rt tib fib   Past Surgical History:  Past Surgical History  Procedure Date  . Dg av dialysis graft declot or   . Av fistula repair Aug. 2013    Left arm   HPI:  Patient is an 78 year old African American female that was brought to the emergency room secondary to fall and experienced a tib-fib fracture. Per discussion with daughter patient was at home she has orthopedics boots on both feet she was trying to turn using the walker and some assistance, she fell. Patient did not lose consciousness. She was brought to the emergency room where she was found to have a tib-fib fracture.   Assessment / Plan / Recommendation Clinical Impression  Patient appears to have an oral phase dysphagia, similar to what is seen with patients with dementia.  Patient dwemonstrated a holding pattern, as well as piecemeal degluttion.  Patient did respond reasonably well to verbal and tactile cues.  Patient will need assist with meals.    Aspiration Risk  Mild    Diet Recommendation Dysphagia 2 (Fine chop);Thin liquid   Liquid Administration via: Straw Medication Administration: Crushed with puree Supervision: Full supervision/cueing for compensatory strategies;Staff feed patient Compensations: Slow rate;Small sips/bites;Check for pocketing Postural Changes and/or Swallow Maneuvers: Seated upright 90 degrees    Other  Recommendations Oral Care Recommendations: Oral care QID;Staff/trained caregiver to provide oral care Other Recommendations: Clarify dietary  restrictions   Follow Up Recommendations  None    Frequency and Duration min 2x/week  2 weeks   Pertinent Vitals/Pain n/a    SLP Swallow Goals Patient will consume recommended diet without observed clinical signs of aspiration with: Moderate assistance Patient will utilize recommended strategies during swallow to increase swallowing safety with: Moderate assistance   Swallow Study Prior Functional Status       General Date of Onset:  (Ongoing, with gradual progression.) HPI: Patient is an 78 year old African American female that was brought to the emergency room secondary to fall and experienced a tib-fib fracture. Per discussion with daughter patient was at home she has orthopedics boots on both feet she was trying to turn using the walker and some assistance, she fell. Patient did not lose consciousness. She was brought to the emergency room where she was found to have a tib-fib fracture. Type of Study: Bedside swallow evaluation Diet Prior to this Study: Regular;Thin liquids Temperature Spikes Noted: No Respiratory Status: Room air History of Recent Intubation: No Behavior/Cognition: Alert;Confused;Uncooperative;Distractible;Requires cueing;Decreased sustained attention;Doesn't follow directions Oral Cavity - Dentition: Adequate natural dentition Self-Feeding Abilities: Needs assist Patient Positioning: Upright in chair Baseline Vocal Quality: Clear Volitional Cough: Cognitively unable to elicit Volitional Swallow: Unable to elicit    Oral/Motor/Sensory Function Overall Oral Motor/Sensory Function: Appears within functional limits for tasks assessed (Pt. not f/c's for oral-motor exam.)   Ice Chips     Thin Liquid Thin Liquid: Within functional limits Presentation: Cup;Straw    Nectar Thick Nectar Thick Liquid: Not tested   Honey Thick Honey Thick Liquid: Not tested  Puree Puree: Impaired Presentation: Spoon Oral Phase Impairments: Impaired anterior to posterior  transit;Reduced lingual movement/coordination (Piecemeal swallows) Oral Phase Functional Implications: Prolonged oral transit;Oral holding;Oral residue   Solid   GO    Solid: Impaired Oral Phase Impairments: Reduced lingual movement/coordination;Impaired anterior to posterior transit Oral Phase Functional Implications: Oral residue;Oral holding       Maryjo Rochester T 01/06/2012,2:27 PM

## 2012-01-06 NOTE — Progress Notes (Signed)
Patient examined and lab reviewed with Vernon PA-C. 

## 2012-01-06 NOTE — Progress Notes (Signed)
Hemodialysis-See Flowsheet BP drop x2 during treatment today. Pt diaphoretic with drop, cbgs monitored (170s). Unable to achieve UF goal 2L. Total UF 1.4L. Unable to weigh pt d/t recliner. Notified RN to get bed weight when return, pt is under EDW. System clotted, blood loss 200cc. Hgb 7.9. [name] notified, no further orders received. Will recheck cbc in am.

## 2012-01-06 NOTE — Progress Notes (Signed)
INITIAL ADULT NUTRITION ASSESSMENT Date: 01/06/2012   Time: 11:16 AM  Reason for Assessment: Low Braden  INTERVENTION: 1. Recommend liberalized diet, pt has been allowed liberalized diet at home per family to encourage meal intake 2. Continue Nepro Shakes, pt enjoys these 3. Recommend Renal MVI 4. RD to continue to follow nutrition care plan  DOCUMENTATION CODES Per approved criteria  -Severe malnutrition in the context of chronic illness   ASSESSMENT: Female 78 y.o.  Dx: Tibia/fibula fracture  Hx:  Past Medical History  Diagnosis Date  . Hyperlipidemia   . Hypertension   . Diabetes mellitus   . DVT (deep venous thrombosis)   . End stage renal disease on dialysis   . Stroke   . Dementia   . Bone fracture 01/04/12    Fracture- rt tib fib   Past Surgical History  Procedure Date  . Dg av dialysis graft declot or   . Av fistula repair Aug. 2013    Left arm   Related Meds:     . amLODipine  5 mg Oral Daily  . atorvastatin  10 mg Oral q1800  . carbidopa-levodopa  1.5 tablet Oral TID  . carvedilol  6.25 mg Oral BID WC  . darbepoetin (ARANESP) injection - DIALYSIS  200 mcg Intravenous Q Mon-HD  . donepezil  10 mg Oral QHS  . enoxaparin (LOVENOX) injection  30 mg Subcutaneous Q24H  . feeding supplement (NEPRO CARB STEADY)  237 mL Oral TID WC  . FLUoxetine  40 mg Oral Daily  . insulin aspart  0-15 Units Subcutaneous TID WC  . insulin glargine  4 Units Subcutaneous QHS  . lanthanum  500 mg Oral TID WC  . levothyroxine  25 mcg Oral q morning - 10a  . loratadine  10 mg Oral Daily  . multivitamin with minerals  1 tablet Oral Daily  . ramipril  5 mg Oral BID  . sevelamer carbonate  1.6 g Oral TID WC  . sodium chloride  3 mL Intravenous Q12H  . [DISCONTINUED] sevelamer  1,600 mg Oral TID WC  . [DISCONTINUED] sevelamer carbonate  0.8 g Oral TID WC  . [DISCONTINUED] sevelamer carbonate  1.6 g Oral TID WC   Ht: 5\' 8"  (172.7 cm)  Wt: 189 lb 1.6 oz (85.775 kg)  Ideal  Wt: 140 lb/63.6 kg % Ideal Wt: 135%  Wt Readings from Last 15 Encounters:  01/05/12 189 lb 1.6 oz (85.775 kg)  11/26/11 175 lb (79.379 kg)  10/29/11 175 lb (79.379 kg)  04/02/11 190 lb (86.183 kg)  11/14/09 190 lb (86.183 kg)  Usual Wt: 175 lb % Usual Wt: 108%  Body mass index is 28.75 kg/(m^2). Overweight  Labs:  CMP     Component Value Date/Time   NA 134* 01/05/2012 0555   K 4.1 01/05/2012 0555   CL 94* 01/05/2012 0555   CO2 28 01/05/2012 0555   GLUCOSE 133* 01/05/2012 0555   BUN 50* 01/05/2012 0555   CREATININE 6.98* 01/05/2012 0555   CALCIUM 10.0 01/05/2012 0555   GFRNONAA 5* 01/05/2012 0555   GFRAA 6* 01/05/2012 0555   No results found for this basename: phos   No results found for this basename: mg   CBG (last 3)   Basename 01/05/12 2031 01/05/12 1733 01/05/12 1342  GLUCAP 131* 122* 75    Intake/Output Summary (Last 24 hours) at 01/06/12 1119 Last data filed at 01/06/12 0800  Gross per 24 hour  Intake    760 ml  Output  0 ml  Net    760 ml  BM: 12/29  Diet Order: Renal 60 - 70; 1200 ml fluid restriction  Supplements/Tube Feeding: Nepro Shake PO TID (scheduled)  IVF:    Estimated Nutritional Needs:   Kcal: 2000 - 2200 kcal Protein: 90 - 110 grams Fluid:  1.2 liters daily  Admitted s/p fall; sustained a tib-fib fx. Per team, no acute need for surgical interventions. Transferred to [facility] for HD, pt with ESRD.   Intake of meals is poor, ranging from 0 - 25%. No skin breakdown noted. Per daughter at bedside, pt has been on a liberalized diet recently for poor PO intake and to help increase/encourage protein intake. She notes that the patient will take Nepro Shakes to help meet nutrition needs. Additionally, pt has had noticeable wt loss since Jan/Feb 2013. Per daughter pt has dropped at least 4-6 dress sizes in that time frame. Daughter also notes that pt has been pocketing food in her mouth and she has requested a swallow evaluation.  Pt meets criteria for  severe MALNUTRITION in the context of chronic illness as evidenced by intake of <75% of estimated energy intake x at least 1 month, severe muscle/fat mass loss, and unknown amount of weight loss.  NUTRITION DIAGNOSIS: Inadequate oral intake r/t poor appetite AEB daughter report of poor oral intake and weight loss.  MONITORING/EVALUATION(Goals): Goal: Pt to meet >/= 90% of their estimated nutrition needs Monitor: weight trends, lab trends, I/O's, PO intake, supplement tolerance  EDUCATION NEEDS: -No education needs identified at this time  Jarold Motto MS, RD, LDN Pager: [phone] After-hours pager: [phone]

## 2012-01-06 NOTE — Progress Notes (Signed)
Utilization review completed.  

## 2012-01-06 NOTE — Progress Notes (Signed)
PAC's seen on patient's telemetry monitoring at 19:00.  Unsustained 4 and 5 beat run of PAC's. Patient asymptomatic. Night shift covering M.D. Notified. Night nurse and telemetry technician aware and monitoring patient.

## 2012-01-06 NOTE — Progress Notes (Addendum)
Physical Therapy Treatment Patient Details Name: RONIYA TETRO MRN: [id number] DOB: 30-Oct-1932 Today's Date: 01/06/2012 Time: 2130-8657 PT Time Calculation (min): 30 min  PT Assessment / Plan / Recommendation Comments on Treatment Session  Admitted with R tibfib fx, NWB and in long leg cast;  Pt with ESRD and dementia;    While dc to SNF continues to be the simplest, most straightforward option, Family very much wants for pt to dc home, and home with familiar routine and caregivers is often the most therapeutic place for pts with dementia; Lengthy discussion re: managing transfers at home with daughter, Gavin Pound, and she has a good handle on the amount of assist Ms. Heydt will need at home; She is already planning to coordinate with family and hired caregivers for +2 assist with transfers; Discussed logistics of transfers, including transfers in prep for transporting to and from HD;   Will need to verify whether the HD center where they would like pt to go post dc (in [place]) has a hoyer lift they can use to transfer pt from her wc to their HD chair; Pt also needs to be set up with regular medical [facility]ena[facility] Niece transport to and from HD  Plan to use Maximive next session to approximate family/cargiver use of hoyer lift, and give the family more options for assisting Ms Bellot with transfers    Follow Up Recommendations  Home health PT;Supervision/Assistance - 24 hour     Does the patient have the potential to tolerate intense rehabilitation     Barriers to Discharge        Equipment Recommendations  3 in 1 bedside comode;Wheelchair (measurements);Wheelchair cushion (measurements);Hospital bed (drop-arm 3in1; possibly transfer board; med [facility] transport)    Recommendations for Other Services OT consult (Thanks!)  Frequency Min 3X/week   Plan Discharge plan needs to be updated;Frequency remains appropriate    Precautions / Restrictions Precautions Precautions: Fall Restrictions Weight Bearing  Restrictions: Yes RLE Weight Bearing: Non weight bearing Other Position/Activity Restrictions: pt in long leg cast on right leg with flexion at knee   Pertinent Vitals/Pain Grimace with movement, pt tolerated transfer; was premedicated for pain    Mobility  Bed Mobility Bed Mobility: Supine to Sit;Scooting to HOB Supine to Sit: 1: +2 Total assist Supine to Sit: Patient Percentage: 20% Details for Bed Mobility Assistance: Continued anxiety with mobility, though seemingly less than initial eval; Was able to attempt to steady herself once sitting up; Physical assist to clear LEs from bed, then +2 assist, with appropriate support from behind given by daughter, and PT assisting in front of pt Transfers Transfers: Lateral/Scoot Transfers Lateral/Scoot Transfers: 1: +2 Total assist Lateral Transfers: Patient Percentage: 20% Details for Transfer Assistance: Performed lateral scoot transfer toeards pt's right side with armrest dropped; +2 assist, with pt's daughter giving appropriate assist (was "in the wedge"); Transfer was with increased effort, and with +2 assist, but pt tolerated it relatively well   Daughter's capable assist with transfers gives this therapist more confidence in family and caregiver's ability to manage at home   Exercises     PT Diagnosis:    PT Problem List:   PT Treatment Interventions:     PT Goals Acute Rehab PT Goals PT Goal Formulation: With patient/family Time For Goal Achievement: 01/19/12 Potential to Achieve Goals: Fair Pt will go Supine/Side to Sit: with min assist PT Goal: Supine/Side to Sit - Progress: Progressing toward goal Pt will go Sit to Supine/Side: with min assist PT Goal: Sit to Supine/Side -  Progress: Progressing toward goal Pt will Transfer Bed to Chair/Chair to Bed: with +2 total assist;Other (comment) (with or without transfer board, and pt=50-60%)) PT Transfer Goal: Bed to Chair/Chair to Bed - Progress: Progressing toward goal  Visit  Information  Last PT Received On: 01/06/12 Assistance Needed: +2    Subjective Data  Subjective: pt mostly non verbal, but will engage with eye contact an smile and indicate when she is in pain Patient Stated Goal: daughter states goal is to go home and return to dialysis 3 days per week   Cognition  Overall Cognitive Status: History of cognitive impairments - at baseline    Balance  Static Sitting Balance Static Sitting - Balance Support: Right upper extremity supported;Left upper extremity supported;Feet supported (LLE on floor for support) Static Sitting - Level of Assistance: 5: Stand by assistance;4: Min assist Static Sitting - Comment/# of Minutes: Sat EOB approx 3-5 minutes in prep for transfer  End of Session PT - End of Session Equipment Utilized During Treatment: Gait belt Activity Tolerance: Patient tolerated treatment well;Patient limited by pain (was premedicated for pain) Patient left: in chair;with call bell/phone within reach;with family/visitor present Nurse Communication: Mobility status;Need for lift equipment;Precautions;Weight bearing status (plan for transfer to HD chair via [facility])   GP     Van Clines [facility] [place], [place] [phone]  01/06/2012, 11:44 AM

## 2012-01-06 NOTE — Progress Notes (Signed)
Physical Therapy Treatment Patient Details Name: Katrina Meyer MRN: [id number] DOB: [birth year] Today's Date: 01/06/2012 Time: 4098-1191 PT Time Calculation (min): 28 min  PT Assessment / Plan / Recommendation Comments on Treatment Session  Admitted with R tib fib fx, NWB, Long leg cast, with ESRD and dementia;   Focus of this second session was to guage pt's tolerance of lift-type transfer with the hard cast, and to give the family options for transfers once home; Overall, pt tolerated the [facility] transfer well (even a few hours out from pain meds);   DC home will still be quite challenging, with the need to coordinate 2 caregivers, pain meds, and HD schedule (to go and to return) for transfers; Daughter seems to be aware of these logistics;   Spoke with Case Mgr re: recommendations for home if family ultimately chooses home over SNF    Follow Up Recommendations  Home health PT;Supervision/Assistance - 24 hour (with +2 assist for transfers) DC to SNF continues to be a viable option     Does the patient have the potential to tolerate intense rehabilitation     Barriers to Discharge        Equipment Recommendations  Wheelchair (measurements);Wheelchair cushion (measurements) (drop-arm3in1; Hospital bed; medical [facility] Niece transport)    Recommendations for Other Services OT consult  Frequency Min 3X/week   Plan Discharge plan remains appropriate    Precautions / Restrictions Precautions Precautions: Fall Restrictions RLE Weight Bearing: Non weight bearing Other Position/Activity Restrictions: pt in long leg cast on right leg with flexion at knee   Pertinent Vitals/Pain Definite pain with moving RLE    Mobility  Transfers Transfer via Lift Equipment: Maximove Details for Transfer Assistance: Used Maximove to transfer pt from regular recliner to HD chair with the purpose of approximating what transferring with a hoyer lift would be like at home    Exercises     PT Diagnosis:      PT Problem List:   PT Treatment Interventions:     PT Goals Acute Rehab PT Goals Time For Goal Achievement: 01/19/12 Potential to Achieve Goals: Fair Pt will Transfer Bed to Chair/Chair to Bed: with +2 total assist;Other (comment) PT Transfer Goal: Bed to Chair/Chair to Bed - Progress: Progressing toward goal  Visit Information  Last PT Received On: 01/06/12 Assistance Needed: +2    Subjective Data  Subjective: pt mostly non verbal, but will engage with eye contact an smile and indicate when she is in pain Patient Stated Goal: daughter states goal is to go home and return to dialysis 3 days per week   Cognition  Overall Cognitive Status: History of cognitive impairments - at baseline    Balance     End of Session PT - End of Session Equipment Utilized During Treatment: Other (comment) (Maximove) Activity Tolerance: Patient tolerated treatment well Patient left: in chair;with family/visitor present (going to HD) Nurse Communication: Mobility status;Need for lift equipment;Other (comment) (asked HD RN to give pain meds in HD)   GP     Van Clines [facility] [place], [place] [phone]  01/06/2012, 3:15 PM

## 2012-01-06 NOTE — Progress Notes (Signed)
Subjective: Pt awake and comfortable.  Daughter present and verbalizes concerns about the pts skin under the cast as she has had trouble with skin breakdown and the family has worked very hard to avoid further problems.  Discussed the need for cast change approximately every two weeks to assess skin and also for pt and family to keep leg elevated and report and increase in areas of pain. Pt will return home where she has 24 hr care.  She will be non ambulatory and bed to chair transfers only.    Objective: Vital signs in last 24 hours: Temp:  [97.9 F (36.6 C)-98.7 F (37.1 C)] 98.1 F (36.7 C) (12/02 0452) Pulse Rate:  [63-71] 64  (12/02 0452) Resp:  [16] 16  (12/02 0452) BP: (106-154)/(48-84) 154/84 mmHg (12/02 0452) SpO2:  [97 %-99 %] 99 % (12/02 0452) Weight:  [85.775 kg (189 lb 1.6 oz)] 85.775 kg (189 lb 1.6 oz) (12/01 2034)  Intake/Output from previous day: 12/01 0701 - 12/02 0700 In: 480 [P.O.:480] Out: 0  Intake/Output this shift:     Basename 01/05/12 0555 01/04/12 1555  HGB 8.2* 9.0*    Basename 01/05/12 0555 01/04/12 1555  WBC 6.3 8.2  RBC 2.87* 3.13*  HCT 26.7* 29.0*  PLT 302 339    Basename 01/05/12 0555 01/04/12 1555  NA 134* 136  K 4.1 4.4  CL 94* 95*  CO2 28 29  BUN 50* 40*  CREATININE 6.98* 6.00*  GLUCOSE 133* 121*  CALCIUM 10.0 10.4    Basename 01/04/12 1555  LABPT --  INR 1.13    Toes of right foot warm and moving well without discomfort.  Cap refill intact bilateral.  Left leg with stasis discoloration of the shin with edema or breakdown.  Assessment/Plan: Strict elevation on pillows to reduce pressure and edema inside cast. Bed to chair transfers with lift. Home when medically stable and return to Dr Barbaraann Faster office in 2 week after discharge.   Lakeesha Fontanilla M 01/06/2012, 9:17 AM

## 2012-01-06 NOTE — Progress Notes (Signed)
Subjective: On hemodialysis, no cos , but non verbal, shakes head yes or no Objective Vital signs in last 24 hours: Filed Vitals:   01/06/12 1500 01/06/12 1505 01/06/12 1510 01/06/12 1530  BP: 101/38 107/46 113/52 145/55  Pulse: 98 100 92 72  Temp:      TempSrc:      Resp:      Height:      Weight:      SpO2:       Weight change:   Intake/Output Summary (Last 24 hours) at 01/06/12 1542 Last data filed at 01/06/12 1400  Gross per 24 hour  Intake    660 ml  Output      0 ml  Net    660 ml   Labs: Basic Metabolic Panel:  Lab 01/06/12 8469 01/05/12 0555 01/04/12 1555  NA 137 134* 136  K 4.2 4.1 4.4  CL 95* 94* 95*  CO2 26 28 29   GLUCOSE 291* 133* 121*  BUN 64* 50* 40*  CREATININE 9.00* 6.98* 6.00*  CALCIUM 9.9 10.0 10.4  ALB -- -- --  PHOS 6.6* -- --   Liver Function Tests:  Lab 01/06/12 1340  AST --  ALT --  ALKPHOS --  BILITOT --  PROT --  ALBUMIN 2.4*   No results found for this basename: LIPASE:3,AMYLASE:3 in the last 168 hours No results found for this basename: AMMONIA:3 in the last 168 hours CBC:  Lab 01/06/12 1340 01/05/12 0555 01/04/12 1555  WBC 7.4 6.3 8.2  NEUTROABS -- -- --  HGB 7.9* 8.2* 9.0*  HCT 25.4* 26.7* 29.0*  MCV 91.0 93.0 92.7  PLT 305 302 339   Cardiac Enzymes: No results found for this basename: CKTOTAL:5,CKMB:5,CKMBINDEX:5,TROPONINI:5 in the last 168 hours CBG:  Lab 01/06/12 1149 01/06/12 0729 01/05/12 2031 01/05/12 1733 01/05/12 1342  GLUCAP 304* 133* 131* 122* 75    Iron Studies: No results found for this basename: IRON,TIBC,TRANSFERRIN,FERRITIN in the last 72 hours Studies/Results: Dg Tibia/fibula Right Port  01/05/2012  *RADIOLOGY REPORT*  Clinical Data: Post reduction  PORTABLE RIGHT TIBIA AND FIBULA - 2 VIEW  Comparison: 01/04/2012  Findings: Fracture of the proximal tibia and fibula with mild displacement.  An oblique and lateral view were obtained.  AP view was not obtained.  Cast is in place.  IMPRESSION: Fractures of  proximal tibia and fibula with mild displacement appearing similar to the prior study.   Original Report Authenticated By: Janeece Riggers, M.D.    Medications:      . amLODipine  5 mg Oral Daily  . atorvastatin  10 mg Oral q1800  . carbidopa-levodopa  1.5 tablet Oral TID  . carvedilol  6.25 mg Oral BID WC  . darbepoetin      . darbepoetin (ARANESP) injection - DIALYSIS  200 mcg Intravenous Q Mon-HD  . donepezil  10 mg Oral QHS  . enoxaparin (LOVENOX) injection  30 mg Subcutaneous Q24H  . feeding supplement (NEPRO CARB STEADY)  237 mL Oral TID WC  . FLUoxetine  40 mg Oral Daily  . insulin aspart  0-15 Units Subcutaneous TID WC  . insulin glargine  4 Units Subcutaneous QHS  . lanthanum  500 mg Oral TID WC  . levothyroxine  25 mcg Oral q morning - 10a  . loratadine  10 mg Oral Daily  . multivitamin with minerals  1 tablet Oral Daily  . ramipril  5 mg Oral BID  . sevelamer carbonate  1.6 g Oral TID WC  .  sodium chloride  3 mL Intravenous Q12H  . [DISCONTINUED] sevelamer carbonate  1.6 g Oral TID WC   I  have reviewed scheduled and prn medications.  Physical Exam: General: Slightly lethargic elderly BF, nad, on hd Heart: Irreg, Irreg with 76 to 92 heart rate on tele, soft 1/6 sem lsb, no rub Lungs: CTA , no rales or wheezing Abdomen: bs=+, soft, nontender Extremities: Dialysis Access: right leg casted/ no left pedal edema, L U A AVF patent on hd    HD Orders= [facility]  MWF , 3hrs , EDW=79kg but noted leaving below edw past txs, 2.0 k bath , 2.5 Ca bath, 2000 units heparin,  15 g needles  With LU A AVF,  EPO= 5500 q hd, Hectoral  q hd Venofer 50mg  q weekly hd  Assessment/Plan:  1. Right proximal tibia-fibular fx - secondary to fall early 11/30; conservative treatment with long-leg fiberglass cast per Dr. Carolynn Sayers needing mobilization and assistance transfers to prepare for return  outpt HD and home where she has 24-hr nursing.  2. ESRD - HD on MWF @ [facility] in [place], [place] = 4.2 /  daughter wants pt to transfer to [facility]  in [place];Clip process started. On hd today with AVF projected KT/V = 1.62 with green OLC light  Tolerating HD IN Recliner Chair today 3. Hypertension/volume - BP 87/46 drop on hd  Pre  Hd 100s' systolic, most recently on Amlodipine 5 mg qd, Coreg 6.25 mg bid, and Ramipril 5 mg bid; current wt 85.8 kg, and edw 79 kg    ??? Bed wt Accurate with no signs or symptoms of fluid overload, chest x-ray negative./ will dc Amlodipine and Ramipril  4. Anemia - Hgb down to 8.2 (9 yesterday). 7.9 hgb today Aranesp 200 mcg today on hd  And weekly iron , will checking iron stores  And follow up am hgb. 5. Metabolic bone disease - Ca 9.9 last P 6.6  on Renvela 1600 mg, Fosrenol 500 mg with meals. Start Zemplar on hd 6. Nutrition - Last Alb 2.4 Dietitian seeing 7. DM Type 2 - on Insulin. 8. PVD - s/p angioplasty and stent placement at right popliteal artery 06/2010 by Dr. Darrick Penna. 9. CVA/ with Dysphagia= speech and PT seeing  10. HO Parkinson's= on Sinemet [name] daughter may be reached at [phone].   [name], PA-C [facility] [phone] 01/06/2012,3:42 PM  LOS: 2 days   Patient seen and examined and agree with assessment and plan as above.  Vinson Moselle  MD [facility] [phone] pgr    [phone] cell 01/06/2012, 5:56 PM

## 2012-01-07 LAB — IRON AND TIBC
Iron: 34 ug/dL — ABNORMAL LOW (ref 42–135)
TIBC: 150 ug/dL — ABNORMAL LOW (ref 250–470)

## 2012-01-07 LAB — CBC
MCH: 29.3 pg (ref 26.0–34.0)
MCHC: 31.1 g/dL (ref 30.0–36.0)
MCV: 94.3 fL (ref 78.0–100.0)
Platelets: 299 10*3/uL (ref 150–400)
RDW: 16.8 % — ABNORMAL HIGH (ref 11.5–15.5)

## 2012-01-07 LAB — BASIC METABOLIC PANEL
CO2: 30 mEq/L (ref 19–32)
Calcium: 10 mg/dL (ref 8.4–10.5)
Creatinine, Ser: 5.86 mg/dL — ABNORMAL HIGH (ref 0.50–1.10)
GFR calc Af Amer: 7 mL/min — ABNORMAL LOW (ref >90)

## 2012-01-07 MED ORDER — CARVEDILOL 6.25 MG PO TABS: 6.2500 mg | ORAL_TABLET | Freq: Two times a day (BID) | ORAL | Status: DC

## 2012-01-07 MED ORDER — SODIUM CHLORIDE 0.9 % IV SOLN: 125.0000 mg | INTRAVENOUS | Status: DC

## 2012-01-07 MED ORDER — TRAMADOL HCL 50 MG PO TABS: 50.0000 mg | ORAL_TABLET | Freq: Four times a day (QID) | ORAL | Status: DC | PRN

## 2012-01-07 MED ORDER — HYDROCODONE-ACETAMINOPHEN 5-325 MG PO TABS: 1.0000 | ORAL_TABLET | Freq: Four times a day (QID) | ORAL | Status: DC | PRN

## 2012-01-07 MED ORDER — ASPIRIN EC 325 MG PO TBEC: 325.0000 mg | DELAYED_RELEASE_TABLET | Freq: Two times a day (BID) | ORAL | Status: DC

## 2012-01-07 NOTE — Progress Notes (Signed)
Occupational Therapy Treatment Patient Details Name: Katrina Meyer MRN: [id number] DOB: January 13, 1933 Today's Date: 01/07/2012 Time: 5621-3086 OT Time Calculation (min): 30 min  OT Assessment / Plan / Recommendation Comments on Treatment Session Family appears to be able to take care of pt. Family has all nec equipment with the exception of drop arm BSC. Family has skilled 24/7 care. Pt appropriate for D/C home at this time.    Follow Up Recommendations  Home health OT    Barriers to Discharge  None    Equipment Recommendations  3 in 1 bedside comode;Wheelchair (measurements);Wheelchair cushion (measurements);Other (comment)    Recommendations for Other Services  see transport rec  Frequency Min 2X/week   Plan Discharge plan remains appropriate    Precautions / Restrictions Precautions Precautions: Fall Restrictions Weight Bearing Restrictions: Yes RLE Weight Bearing: Non weight bearing Other Position/Activity Restrictions: pt in long leg cast on right leg with flexion at knee   Pertinent Vitals/Pain Apparent pain with mobility    ADL  Eating/Feeding: Set up;Supervision/safety Grooming: Moderate assistance Where Assessed - Grooming: Supported sitting Upper Body Bathing: Maximal assistance Where Assessed - Upper Body Bathing: Supported sitting Lower Body Bathing: +1 Total assistance Where Assessed - Lower Body Bathing: Supine, head of bed up;Rolling right and/or left Upper Body Dressing: Maximal assistance Where Assessed - Upper Body Dressing: Unsupported sitting Lower Body Dressing: +1 Total assistance Where Assessed - Lower Body Dressing: Supine, head of bed up;Rolling right and/or left Toilet Transfer: +2 Total assistance Toilet Transfer: Patient Percentage: 10% Toilet Transfer Method: Other (comment);Squat pivot (bed chaiar) Toileting - Clothing Manipulation and Hygiene: +1 Total assistance Where Assessed - Toileting Clothing Manipulation and Hygiene: Supine, head of  bed flat;Rolling right and/or left Equipment Used: Gait belt Transfers/Ambulation Related to ADLs: Total A +2 ADL Comments: focus of session on family education. Reviewed transfer technique using slide board. Educated pt's daughter on best techniques for bathing and dressing andclothing choice. discussed toileting pt with daughter, including need to use drop arm BSC with use of slideboard. Pt's daughter states that her help are CNAs and that they are competent. Discussed need for HHOT/PT to train aregivers to increase strength and safety with ADL.    OT Diagnosis: Generalized weakness;Acute pain;Cognitive deficits  OT Problem List: Decreased strength;Decreased range of motion;Decreased activity tolerance;Impaired balance (sitting and/or standing);Decreased coordination;Decreased cognition;Decreased safety awareness;Decreased knowledge of use of DME or AE;Decreased knowledge of precautions;Cardiopulmonary status limiting activity;Impaired sensation;Impaired tone;Pain OT Treatment Interventions: Self-care/ADL training;DME and/or AE instruction;Therapeutic activities;Patient/family education   OT Goals Acute Rehab OT Goals OT Goal Formulation: With family Time For Goal Achievement: 01/21/12 Potential to Achieve Goals: Good ADL Goals Additional ADL Goal #1: Family will verbalize understanding of safest transfer technique for bed - drop arm BSC with use of sliding board. ADL Goal: Additional Goal #1 - Progress: Met Additional ADL Goal #2: family will verbalize understanding of safest and least cumbersome technique of bathing/dressing pt. ADL Goal: Additional Goal #2 - Progress: Met Miscellaneous OT Goals Miscellaneous OT Goal #1: Family will verbalize importance of pressure relief isn sitting and supine to maintain skin integrity. OT Goal: Miscellaneous Goal #1 - Progress: Met  Visit Information  Last OT Received On: 01/07/12 Assistance Needed: +2    Subjective Data      Prior Functioning   Home Living Lives With: Family Available Help at Discharge: Personal care attendant;Available 24 hours/day Type of Home: House Home Access: Level entry Home Layout: One level Bathroom Shower/Tub: Health visitor: Handicapped height Bathroom  Accessibility: Yes How Accessible: Accessible via wheelchair Home Adaptive Equipment: Built-in shower seat;Bathtub lift;Grab bars around toilet;Grab bars in shower;Hand-held shower hose;Walker - rolling;Wheelchair - [employer] - four wheeled Additional Comments: Daughter very aware of medical equipment available and what might best suits patient needs Prior Function Level of Independence: Needs assistance Needs Assistance: Bathing;Dressing;Grooming;Meal Prep;Light Housekeeping;Transfers Bath: Moderate Dressing: Moderate Grooming: Moderate Meal Prep: Total Light Housekeeping: Total Transfer Assistance: Min a at times. Had lift chair Able to Take Stairs?: No Driving: No Vocation: Retired Musician: Expressive difficulties;Other (comment) (dementia) Dominant Hand: Right    Cognition  Overall Cognitive Status: Impaired Area of Impairment: Attention;Memory;Following commands;Safety/judgement;Awareness of errors;Awareness of deficits;Problem solving;Executive functioning Arousal/Alertness: Awake/alert Cognition - Other Comments: cognition impaired due to dementia. At baseline, pt verbal. At this time, pt not as verbal and more difficulty with following simple commands. ? due to pain meds    Mobility  Shoulder Instructions Bed Mobility Bed Mobility: Supine to Sit;Sitting - Scoot to Edge of Bed Supine to Sit: 1: +2 Total assist;HOB elevated;With rails Supine to Sit: Patient Percentage: 20% Sitting - Scoot to Edge of Bed: 1: +2 Total assist Sitting - Scoot to Edge of Bed: Patient Percentage: 20% Transfers Transfers:  (squat pivot - slidebaord - total A +2. @ nd person for safet)       Exercises       Balance Static Sitting Balance Static Sitting - Balance Support: Bilateral upper extremity supported (L foot supported) Static Sitting - Level of Assistance: 5: Stand by assistance   End of Session OT - End of Session Equipment Utilized During Treatment: Gait belt Activity Tolerance: Patient tolerated treatment well Patient left: in chair;with call bell/phone within reach;with family/visitor present Nurse Communication: Mobility status  GO     Notnamed Scholz,HILLARY 01/07/2012, 1:42 PM

## 2012-01-07 NOTE — Progress Notes (Addendum)
Pt given 2 vicodin given prior to working with PT/OT; will cont. To monitor.

## 2012-01-07 NOTE — Clinical Documentation Improvement (Signed)
MALNUTRITION DOCUMENTATION CLARIFICATION  THIS DOCUMENT IS NOT A PERMANENT PART OF THE MEDICAL RECORD  TO RESPOND TO THE THIS QUERY, FOLLOW THE INSTRUCTIONS BELOW:  1. If needed, update documentation for the patient's encounter via the notes activity.  2. Access this query again and click edit on the In [employer].  3. After updating, or not, click F2 to complete all highlighted (required) fields concerning your review. Select "additional documentation in the medical record" OR "no additional documentation provided".  4. Click Sign note button.  5. The deficiency will fall out of your In Basket *Please let us know if you are not able to complete this workflow by phone or e-mail (listed below).  Please update your documentation within the medical record to reflect your response to this query.                                                                                        01/07/12   Dear Dr.Hongalgi/ Associates,  In a better effort to capture your patient's severity of illness, reflect appropriate length of stay and utilization of resources, a review of the patient medical record has revealed the following indicators.    Based on your clinical judgment, please clarify and document in a progress note and/or discharge summary the clinical condition associated with the following supporting information:  In responding to this query please exercise your independent judgment.  The fact that a query is asked, does not imply that any particular answer is desired or expected.   Possible Clinical Conditions?  ____Severe Malnutrition    ____Severe Protein Calorie Malnutrition  ____Other Condition  ____Cannot clinically determine     Supporting Information: Risk Factors:  Patient meets criteria for Severe Malnutrition in the context of chronic illness, per RD's 12/02 evaluation. Noted severe muscle/fat mass loss; unknown amount of weight loss per daughter, per RD's 12/02  evaluation.  Signs & Symptoms: Ht: 5'8"    Wt: 85.775kg  BMI: 28.75  Medications: Continue Nepro shakes; recommend renal MVI.  Nutrition Consult: 01/06/12.   You may use possible, probable, or suspect with inpatient documentation. possible, probable, suspected diagnoses MUST be documented at the time of discharge  Reviewed: Severe Malnutrition noted per 01/08/12 discharge summary.                                   Thank You,  Marciano Sequin,  Clinical Documentation Specialist:  Pager: [phone]  Phone: [phone]  Health Information Management [employer]

## 2012-01-07 NOTE — Progress Notes (Signed)
   CARE MANAGEMENT NOTE 01/07/2012  Patient:  Katrina Meyer, Katrina Meyer   Account Number:  [id number]  Date Initiated:  01/06/2012  Documentation initiated by:  Darlyne Russian  Subjective/Objective Assessment:   Patient admitted s/p fall at home with fx tibia/fibula.PMH: ESRD/ HD on MWF at [facility] in [place].  Family wants to change to [facility]  Patient lives at home with family.     Action/Plan:   Progression of care and discharge planning.   Anticipated DC Date:     Anticipated DC Plan:  HOME W HOME HEALTH SERVICES  In-house referral  Clinical Social Worker      DC Planning Services  CM consult      Choice offered to / List presented to:  C-4 Adult Children   DME arranged  3-N-1  HOSPITAL BED      DME agency  [facility]     HH arranged  HH-3 OT  HH-2 PT      Status of service:  In process, will continue to follow Medicare Important Message given?   (If response is "NO", the following Medicare IM given date fields will be blank) Date Medicare IM given:   Date Additional Medicare IM given:    Discharge Disposition:    Per UR Regulation:  Reviewed for med. necessity/level of care/duration of stay  If discussed at Long Length of Stay Meetings, dates discussed:    Comments:  01/07/2012  [address] RN, [place]  [phone] CM referral: [facility] PT/OT                     DME: hospital bed, hoyer lift, transfer board, drop arm 3:1 commode  Met with daughter to discuss discharge planning: She has 24/7 private pay caregivers at the home. Discussed equipment needs, she wants [place] for DME. Home Health agency: advised [facility], [facility] provide PT in [place].  She will select an agency. Transportation to/from HD: daughter spoke with SW for resources  Dialysis: currently at [facility] in [place], she states the paperwork has been started for transfer to [facility] in [place]. Discussed continue going to [place] until the transfer to [place]  completed.

## 2012-01-07 NOTE — Progress Notes (Signed)
Occupational Therapy Evaluation Patient Details Name: Katrina Meyer MRN: [id number] DOB: [birth year] Today's Date: 01/07/2012 Time: 1100-1130 OT Time Calculation (min): 30 min  OT Assessment / Plan / Recommendation Clinical Impression  78 yo with hx of dementia with tib fib fx. Closed reduction. NWB.Long leg cast. PTA, pt was ambulaotry with rollator and had 24/7 A at home. Pt currently requires total A with mobility and ADL at this time. Pt will benefit from skilled OT services to max ability to safely care for pt at home. Pt has 24/7 care at home and will be able to D/C safely home. Pt needs drop arm BSC for D/C home. Focus of session on transfer techniques and family education.     OT Assessment  Patient needs continued OT Services    Follow Up Recommendations  Home health OT , ambulance transport home; w/c transport to dialysis   Barriers to Discharge None    Equipment Recommendations  3 in 1 bedside comode (drop arm);Wheelchair (measurements);Wheelchair cushion (measurements);Other (comment) (drop arm BSC w/c with ELR)    Recommendations for Other Services  none  Frequency  Min 2X/week    Precautions / Restrictions Precautions Precautions: Fall Restrictions Weight Bearing Restrictions: Yes RLE Weight Bearing: Non weight bearing Other Position/Activity Restrictions: pt in long leg cast on right leg with flexion at knee   Pertinent Vitals/Pain Grimacing during transfer, but appeared to tolerate well    ADL  Eating/Feeding: Set up;Supervision/safety Grooming: Moderate assistance Where Assessed - Grooming: Supported sitting Upper Body Bathing: Maximal assistance Where Assessed - Upper Body Bathing: Supported sitting Lower Body Bathing: +1 Total assistance Where Assessed - Lower Body Bathing: Supine, head of bed up;Rolling right and/or left Upper Body Dressing: Maximal assistance Where Assessed - Upper Body Dressing: Unsupported sitting Lower Body Dressing: +1 Total  assistance Where Assessed - Lower Body Dressing: Supine, head of bed up;Rolling right and/or left Toilet Transfer: +2 Total assistance Toilet Transfer: Patient Percentage: 10% Toilet Transfer Method: Other (comment);Squat pivot (bed chaiar) Toileting - Clothing Manipulation and Hygiene: +1 Total assistance Where Assessed - Toileting Clothing Manipulation and Hygiene: Supine, head of bed flat;Rolling right and/or left Equipment Used: Gait belt Transfers/Ambulation Related to ADLs: Total A +2 ADL Comments: Max to total A wth all ADL.  Focus on using squat pivot transfer with pt with use of "chuck". Also assessed with slideboard. Pt able to tolerate both methods and able to maintain WBS.   OT Diagnosis: Generalized weakness;Acute pain;Cognitive deficits  OT Problem List: Decreased strength;Decreased range of motion;Decreased activity tolerance;Impaired balance (sitting and/or standing);Decreased coordination;Decreased cognition;Decreased safety awareness;Decreased knowledge of use of DME or AE;Decreased knowledge of precautions;Cardiopulmonary status limiting activity;Impaired sensation;Impaired tone;Pain OT Treatment Interventions: Self-care/ADL training;DME and/or AE instruction;Therapeutic activities;Patient/family education   OT Goals Acute Rehab OT Goals OT Goal Formulation: With family Time For Goal Achievement: 01/21/12 Potential to Achieve Goals: Good ADL Goals Additional ADL Goal #1: Family will verbalize understanding of safest transfer technique for bed - drop arm BSC with use of sliding board. ADL Goal: Additional Goal #1 - Progress: Goal set today Additional ADL Goal #2: family will verbalize understanding of safest and least cumbersome technique of bathing/dressing pt. ADL Goal: Additional Goal #2 - Progress: Goal set today Miscellaneous OT Goals Miscellaneous OT Goal #1: Family will verbalize importance of pressure relief isn sitting and supine to maintain skin integrity. OT  Goal: Miscellaneous Goal #1 - Progress: Goal set today  Visit Information  Last OT Received On: 01/07/12 Assistance Needed: +2  Subjective Data      Prior Functioning     Home Living Lives With: Family Available Help at Discharge: Personal care attendant;Available 24 hours/day Type of Home: House Home Access: Level entry Home Layout: One level Bathroom Shower/Tub: Health visitor: Handicapped height Bathroom Accessibility: Yes How Accessible: Accessible via wheelchair Home Adaptive Equipment: Built-in shower seat;Bathtub lift;Grab bars around toilet;Grab bars in shower;Hand-held shower hose;Walker - rolling;Wheelchair - [employer] - four wheeled Additional Comments: Daughter very aware of medical equipment available and what might best suits patient needs Prior Function Level of Independence: Needs assistance Needs Assistance: Bathing;Dressing;Grooming;Meal Prep;Light Housekeeping;Transfers Bath: Moderate Dressing: Moderate Grooming: Moderate Meal Prep: Total Light Housekeeping: Total Transfer Assistance: Min a at times. Had lift chair Able to Take Stairs?: No Driving: No Vocation: Retired Musician: Expressive difficulties;Other (comment) (dementia) Dominant Hand: Right         Vision/Perception     Cognition  Overall Cognitive Status: Impaired Area of Impairment: Attention;Memory;Following commands;Safety/judgement;Awareness of errors;Awareness of deficits;Problem solving;Executive functioning Cognition - Other Comments: cognition impaired due to dementia. At baseline, pt verbal. At this time, pt not as verbal and more difficulty with following simple commands. ? due to pain meds    Extremity/Trunk Assessment Right Upper Extremity Assessment RUE ROM/Strength/Tone: Deficits RUE ROM/Strength/Tone Deficits: generalized weakness Left Upper Extremity Assessment LUE ROM/Strength/Tone: Deficits (generalized weakness) Right  Lower Extremity Assessment RLE ROM/Strength/Tone: Deficits;Unable to fully assess;Due to precautions Left Lower Extremity Assessment LLE ROM/Strength/Tone: Deficits LLE ROM/Strength/Tone Deficits: plantar flexion contracture.  pt appears to lack full extension at knee in supine.  In sitting, she kept knee at 90 degree flexion and would not allow it to be extended Trunk Assessment Trunk Assessment: Normal     Mobility Bed Mobility Bed Mobility: Supine to Sit;Sitting - Scoot to Edge of Bed Supine to Sit: 1: +2 Total assist;HOB elevated;With rails Supine to Sit: Patient Percentage: 20% Sitting - Scoot to Edge of Bed: 1: +2 Total assist Sitting - Scoot to Edge of Bed: Patient Percentage: 20% Transfers Transfers:  (squat pivot transfer Total a +2)     Shoulder Instructions     Exercise     Balance Static Sitting Balance Static Sitting - Balance Support: Bilateral upper extremity supported (L foot supported) Static Sitting - Level of Assistance: 5: Stand by assistance   End of Session OT - End of Session Equipment Utilized During Treatment: Gait belt;Sliding board Activity Tolerance: Patient tolerated treatment well Patient left: in chair;with call bell/phone within reach;with family/visitor present Nurse Communication: Mobility status  GO     Zyden Suman,HILLARY 01/07/2012, 1:29 PM [facility], OTR/L  [phone] 01/07/2012

## 2012-01-07 NOTE — Progress Notes (Signed)
Patient ID: Katrina Meyer, female   DOB: Jan 30, 1933, 78 y.o.   MRN: [id number]  Addendum to my notes today=  Pt. [facility] by [facility] schedule , next HD will be Tomorrow at 11;30 am and Daughter will provide transportation with pt in Wheelchair going from home to center in [place].

## 2012-01-07 NOTE — Progress Notes (Signed)
Subjective:    Awake alert, dementia, not verbal today. Moving extremities spontaneously. Daughter is in room Discussion with her, likely slow progress to transfers  Bed to chair and bed to wheelchair. She has 24 hour  Nursing at home and will need continued HHN for  OT and PT. I don't think that we can reasonably expect Great progress and she is low activity level pre admit With bilateral heel cord contractures and knee flexion Contractures related to prolong recumbancy and low  Activity level, minimal walking. Patient reports pain as mild.    Objective:   VITALS:  Temp:  [97.9 F (36.6 C)-98.4 F (36.9 C)] 98 F (36.7 C) (12/03 1024) Pulse Rate:  [65-100] 71  (12/03 1024) Resp:  [14-19] 17  (12/03 1024) BP: (79-158)/(37-71) 132/58 mmHg (12/03 1024) SpO2:  [98 %-100 %] 100 % (12/03 1024)  ABD soft Dorsiflexion/Plantar flexion intact No cellulitis present Right log leg cast is in good condition. right foot toes with good capillary refill and warm.   LABS  Basename 01/07/12 0708 01/06/12 1340 01/05/12 0555 01/04/12 1555  HGB 8.3* 7.9* 8.2* 9.0*  WBC 8.4 7.4 -- --  PLT 299 305 -- --    Basename 01/07/12 0708 01/06/12 1340  NA 140 137  K 4.2 4.2  CL 99 95*  CO2 30 26  BUN 32* 64*  CREATININE 5.86* 9.00*  GLUCOSE 151* 291*    Basename 01/04/12 1555  LABPT --  INR 1.13     Assessment/Plan:      Advance diet Up with therapy Discharge home with home health Will likely need ambulance to assist in returning to home Needs 24 nursing assistant care and assistance with ADLs. Orthopaedically she is really for discharge realizing that She is going to have slow recovery from this injury  And unfortunately her ability to be mobilized is limited by her preinjury status of being mostly bed to chair activity level No real advantage to continued hospitalization. Will need Zenaida Niece to get to dialysis 3 times weekly. NITKA,JAMES E 01/07/2012, 11:38 AM

## 2012-01-07 NOTE — Progress Notes (Signed)
Physical Therapy Treatment Patient Details Name: Katrina Meyer MRN: [id number] DOB: 06-Jun-1932 Today's Date: 01/07/2012 Time: 1130-1208 PT Time Calculation (min): 38 min  PT Assessment / Plan / Recommendation Comments on Treatment Session  Improving tolerance with transfers; Focus of this session was caregiver education, problem-solving, and prep for dc home (likely today via ambulance); Discussed equipment recommendations, recs for services, need for wheelchair in [place] transport to/from HD; Daughter understanding of recommendations; Updated Case Mgr, MD    Follow Up Recommendations  Home health PT;Supervision/Assistance - 24 hour (+2 assist for transfers, HHOT)     Does the patient have the potential to tolerate intense rehabilitation     Barriers to Discharge        Equipment Recommendations  3 in 1 bedside comode;Wheelchair (measurements);Wheelchair cushion (measurements);Other (comment) (drop arm 3in1, transfer bd; ELR/removable armrests for wc)    Recommendations for Other Services    Frequency Min 3X/week   Plan Discharge plan remains appropriate    Precautions / Restrictions Precautions Precautions: Fall Restrictions Weight Bearing Restrictions: Yes RLE Weight Bearing: Non weight bearing Other Position/Activity Restrictions: pt in long leg cast on right leg with flexion at knee   Pertinent Vitals/Pain Grimace wth movement; was premedicated for pain    Mobility  Bed Mobility Bed Mobility: Supine to Sit;Sitting - Scoot to Edge of Bed Supine to Sit: 1: +2 Total assist;HOB elevated;With rails Supine to Sit: Patient Percentage: 20% Sitting - Scoot to Edge of Bed: 1: +2 Total assist Sitting - Scoot to Edge of Bed: Patient Percentage: 20% Details for Bed Mobility Assistance: Continuing to require +2 assist to go supine to sit, but able to tolerate smoother, more efficient movement; Used bed pad to assist pt to square of hips at EOB Transfers Transfers: Sit to  Stand;Lateral/Scoot Transfers Sit to Stand: 1: +2 Total assist;From bed Sit to Stand: Patient Percentage: 0% Lateral/Scoot Transfers: 1: +2 Total assist Lateral Transfers: Patient Percentage: 30% Details for Transfer Assistance: Attempted sit to stand on LLE from bed, however min to no Wbing response on LLE for standing; Performed lateral scoot transfer bed to recliner then to bed and back to recliner with armrest removed and with transfer board; Second person for safety, and to steady recliner; Much better tolerance of transfers; overall improvements    Exercises     PT Diagnosis:    PT Problem List:   PT Treatment Interventions:     PT Goals Acute Rehab PT Goals Time For Goal Achievement: 01/19/12 Potential to Achieve Goals: Fair Pt will go Supine/Side to Sit: with min assist PT Goal: Supine/Side to Sit - Progress: Progressing toward goal Pt will Transfer Bed to Chair/Chair to Bed: with +2 total assist;Other (comment) PT Transfer Goal: Bed to Chair/Chair to Bed - Progress: Progressing toward goal  Visit Information  Last PT Received On: 01/07/12 Assistance Needed: +2    Subjective Data  Subjective: pt mostly non verbal, but will engage with eye contact an smile and indicate when she is in pain Patient Stated Goal: daughter states goal is to go home and return to dialysis 3 days per week   Cognition  Overall Cognitive Status: Impaired Area of Impairment: Attention;Memory;Following commands;Safety/judgement;Awareness of errors;Awareness of deficits;Problem solving;Executive functioning Arousal/Alertness: Awake/alert Cognition - Other Comments: cognition impaired due to dementia. At baseline, pt verbal. At this time, pt not as verbal and more difficulty with following simple commands. ? due to pain meds    Balance  Static Sitting Balance Static Sitting - Balance Support: Bilateral  upper extremity supported (L foot supported) Static Sitting - Level of Assistance: 5: Stand by  assistance  End of Session PT - End of Session Equipment Utilized During Treatment: Other (comment) (sliding board) Activity Tolerance: Patient tolerated treatment well Patient left: in chair;with call bell/phone within reach;with family/visitor present Nurse Communication: Mobility status;Other (comment) (ideas for transfer back to bed)   GP     Marylu Lund [facility] [place], [place] [phone]  01/07/2012, 2:09 PM

## 2012-01-07 NOTE — Progress Notes (Signed)
Subjective:   No cos, Daughter in room, nonverbal for my questions,but soft whispering tone answers to daughter's questions Objective Vital signs in last 24 hours: Filed Vitals:   01/06/12 1707 01/06/12 1803 01/06/12 2100 01/07/12 0447  BP:  120/50 116/54 158/71  Pulse:  77 75 89  Temp: 97.9 F (36.6 C)  98.3 F (36.8 C) 98.2 F (36.8 C)  TempSrc: Oral  Oral Oral  Resp:  14 16 16   Height:      Weight:      SpO2:  98% 98% 99%   Weight change:   Intake/Output Summary (Last 24 hours) at 01/07/12 0924 Last data filed at 01/06/12 1856  Gross per 24 hour  Intake     23 ml  Output   1448 ml  Net  -1425 ml   Labs: Basic Metabolic Panel:  Lab 01/07/12 1610 01/06/12 1340 01/05/12 0555  NA 140 137 134*  K 4.2 4.2 4.1  CL 99 95* 94*  CO2 30 26 28   GLUCOSE 151* 291* 133*  BUN 32* 64* 50*  CREATININE 5.86* 9.00* 6.98*  CALCIUM 10.0 9.9 10.0  ALB -- -- --  PHOS -- 6.6* --   Liver Function Tests:  Lab 01/06/12 1340  AST --  ALT --  ALKPHOS --  BILITOT --  PROT --  ALBUMIN 2.4*   No results found for this basename: LIPASE:3,AMYLASE:3 in the last 168 hours No results found for this basename: AMMONIA:3 in the last 168 hours CBC:  Lab 01/07/12 0708 01/06/12 1340 01/05/12 0555 01/04/12 1555  WBC 8.4 7.4 6.3 --  NEUTROABS -- -- -- --  HGB 8.3* 7.9* 8.2* --  HCT 26.7* 25.4* 26.7* --  MCV 94.3 91.0 93.0 92.7  PLT 299 305 302 --   Cardiac Enzymes: No results found for this basename: CKTOTAL:5,CKMB:5,CKMBINDEX:5,TROPONINI:5 in the last 168 hours CBG:  Lab 01/07/12 0724 01/06/12 1707 01/06/12 1533 01/06/12 1149 01/06/12 0729  GLUCAP 140* 171* 147* 304* 133*    Iron Studies:  Basename 01/06/12 1821  IRON 34*  TIBC 150*  TRANSFERRIN --  FERRITIN --   Studies/Results: No results found. Medications:      . atorvastatin  10 mg Oral q1800  . carbidopa-levodopa  1.5 tablet Oral TID  . carvedilol  6.25 mg Oral BID WC  . darbepoetin (ARANESP) injection - DIALYSIS   200 mcg Intravenous Q Mon-HD  . donepezil  10 mg Oral QHS  . enoxaparin (LOVENOX) injection  30 mg Subcutaneous Q24H  . feeding supplement (NEPRO CARB STEADY)  237 mL Oral TID WC  . FLUoxetine  40 mg Oral Daily  . insulin aspart  0-15 Units Subcutaneous TID WC  . insulin glargine  4 Units Subcutaneous QHS  . lanthanum  500 mg Oral TID WC  . levothyroxine  25 mcg Oral q morning - 10a  . loratadine  10 mg Oral Daily  . multivitamin with minerals  1 tablet Oral Daily  . paricalcitol  6 mcg Intravenous Q M,W,F-HD  . sevelamer carbonate  1.6 g Oral TID WC  . sodium chloride  3 mL Intravenous Q12H  . [DISCONTINUED] amLODipine  5 mg Oral Daily  . [DISCONTINUED] ramipril  5 mg Oral BID  . [DISCONTINUED] sevelamer carbonate  1.6 g Oral TID WC   I  have reviewed scheduled and prn medications.  Physical Exam: General: alert, nad., slightly more interaction verbally today with daughter in room Heart: Irreg, Irreg with heart rate 80s, soft 1/6 sem lsb, no  rub  Lungs: CTA , no rales or wheezing  Abdomen: bs=+, soft, nontender  Extremities: Dialysis Access: right leg casted/ no left pedal edema, L L A AVF positive bruit  HD Orders= [facility] MWF , 3hrs , EDW=79kg but noted leaving below edw past txs, 2.0 k bath , 2.5 Ca bath, 2000 units heparin, 15 g needles With LU A AVF, EPO= 5500 q hd, Hectoral q hd Venofer 50mg  q weekly hd   Assessment/Plan:  1. Right proximal tibia-fibular fx - secondary to fall early 11/30; conservative treatment with long-leg fiberglass cast per Dr. Carolynn Sayers needing mobilization and assistance transfers to prepare for return outpt HD and home where daughter plans to have  24-hr nursing.  2. ESRD - HD on MWF @ [facility] in [place], [place] = 4.2 / daughter wants pt to transfer to [facility] in [place]; Yesterday Clip process started. Tolerated HD IN Recliner Chair  Yesterday  With 1482 uf bp drop into 80s, with bp meds Ramipril and Amlodipine dc  yesterday 3. Hypertension/volume - Now  BP = 158/71  And yesterday  BP 87/46 drop on hd Pre Hd 100s' systolic, BP meds dc as above and continued Coreg 6.25 mg bid, and post hd  wt 85.7 kg, and edw 79 kg ??? Bed wt Accurate with no signs or symptoms of fluid overload, chest x-ray negative./ Attempt 2 l uf in am    4. Anemia - Hgb 8.3 hgb today Aranesp 200 mcg  on hd  , 23% Tranfer. Sat, give 100mg  iron x 3 then weekly on hd follow up am hgb. 5. Metabolic bone disease - Ca 9.9 last P 6.6 on Renvela 1600 mg, Fosrenol 500 mg with meals. Start Zemplar on hd 6. Nutrition - Last Alb 2.4 Dietitian seeing 7. DM Type 2 - on Insulin. 8. PVD - s/p angioplasty and stent placement at right popliteal artery 06/2010 by Dr. Darrick Penna. 9. CVA/ with Dysphagia= speech and PT seeing  10. HO Parkinson's= on Sinemet [name] daughter may be reached at [phone].  [name], PA-C [facility] [place] [phone] 01/07/2012,9:24 AM  LOS: 3 days

## 2012-01-07 NOTE — Progress Notes (Signed)
Pt to d/c home today; D/C process in place at this time; pt to d/c home with daughter; ambulance to transport pt home; will cont. To monitor.

## 2012-01-07 NOTE — Discharge Summary (Addendum)
Physician Discharge Summary  Katrina Meyer ZOX:[id number] DOB: Dec 02, 1932 DOA: 01/04/2012  PCP: Ignatius Specking., MD  Admit date: 01/04/2012 Discharge date: 01/08/2012  Time spent: greater than 30 minutes  Recommendations for Outpatient Follow-up:  1. With Dr. Vira Browns, Orthopedics in 2 weeks from hospital discharge. 2. With Dr. Doreen Beam, PCP. 3. With [facility]: for Hemodialysis on Mon, Wed & Fri. Next HD on 12/4.  Discharge Diagnoses:  Principal Problem:  *Tibia/fibula fracture Active Problems:  DM  HYPERTENSION  Chronic systolic heart failure  ESRD  Dysphagia  Anemia   Discharge Condition: Improved & stable.  Diet recommendation: Dysphagia 2 diet with diabetic & renal modifications.  Filed Weights   01/05/12 1237 01/05/12 2034  Weight: 85.775 kg (189 lb 1.6 oz) 85.775 kg (189 lb 1.6 oz)    History of present illness:  Katrina Meyer is a 78 y.o. African American woman with ESRD--on dialysis on MWF at [facility] in [place] who came to the [facility] emergency room on 11/30 after a fall and was found to have a mildly displaced right proximal one-third tibia-fibula fracture. She fell very early on Saturday morning and returned to bed, but continued to have significant pain and difficulty using right leg before going to the ER in the afternoon, where she was seen by Dr. Vira Browns. She has peripheral vascular disease and had a stent placed in the right popliteal artery 06/2010 by Dr. Fabienne Bruns, but is limited to conservative treatment with a long-leg fiberglass cast per Dr. Otelia Sergeant rather than surgical intervention to avoid vascular compromise. She was transferred to [facility] on 12/1 to receive her dialysis on 12/2.  Her daughter states that the patient has a fistulogram scheduled at [facility] on 12/3 to evaluate her left forearm fistula and a swallow study, also believed to be on 12/3, but at [facility], to evaluate dysphagia.   Hospital Course:   1. Right proximal tibia/fibular fracture, secondary to fall on 11/30: Conservative treatment with long leg fiberglass cast per Dr. Otelia Sergeant. No significant pain. Ortho has cleared for d/c home.Outpatient followup with Dr. Otelia Sergeant in 2 weeks after discharge. Dr. Otelia Sergeant has provided pain med prescriptions. 2. ESRD: HD on MWF. Nephrology consulted. Nephrology arranging for transfer of outpatient dialysis to a different center at family's request- will start at new center 12/4. Further evaluation of dialysis access deferred to nephrology. 3. ? Intermittent dysphagia:Dysphagia 2 diet per ST. 4. Anemia: Likely secondary to CKD. Stable. Continue Aranesp per nephrology. 5. Hypertension: Controlled. Continue Coreg, Norvasc and ramipril. 6. Diabetes: Reasonable inpatient control. 7. Chronic systolic congestive heart failure: Clinically compensated. 8. Hypothyroid: Continue Synthroid 9. History of Parkinson's: Continue Sinemet. 10. Severe Malnutrition in the context of chronic illness, per dietician  Consultants:  Orthopedics  Nephrology  Procedures:  Right lower extremity long leg fiberglass cast.  Antibiotics:  None (indicate start date, and stop date if known)    Discharge Exam:  Complaints: Denies complaints. Per daughter, no acute issues.   Filed Vitals:   01/06/12 2100 01/07/12 0447 01/07/12 1024 01/07/12 1330  BP: 116/54 158/71 132/58 137/90  Pulse: 75 89 71 79  Temp: 98.3 F (36.8 C) 98.2 F (36.8 C) 98 F (36.7 C) 98 F (36.7 C)  TempSrc: Oral Oral    Resp: 16 16 17 18   Height:      Weight:      SpO2: 98% 99% 100% 100%    General exam: Appears comfortable.  Respiratory system: Clear to  auscultation. No increased work of breathing.  Cardiovascular system: First and second heart sounds heard, regular rate and rhythm. No JVD or murmurs or gallops. Telemetry shows normal sinus rhythm with occasional PACs.  Gastrointestinal system: Abdomen is nondistended, soft and nontender.  Normal bowel sounds heard.  Central nervous system: Alert and oriented only to self. No focal neurological deficits.  Extremities: Right lower extremity in long-leg fiberglass cast from forefoot to upper thigh. Able to wiggle toes.   Discharge Instructions      Discharge Orders    Future Appointments: Provider: Department: Dept Phone: Center:   01/09/2012 3:15 PM Bella Kennedy, PT [facility] [phone] None   04/28/2012 2:30 PM [facility] [phone] [facility]   04/28/2012 3:00 PM [facility] [phone] [facility]   04/28/2012 3:40 PM Evern Bio, NP [facility] [phone] [facility]     Future Orders Please Complete By Expires   Non weight bearing      Non weight bearing      Scheduling Instructions:   On right leg   Non weight bearing      Up in chair      Call MD / Call 911      Comments:   If you experience chest pain or shortness of breath, CALL 911 and be transported to the hospital emergency room.  If you develope a fever above 101 F, pus (white drainage) or increased drainage or redness at the wound, or calf pain, call your surgeon's office.   Constipation Prevention      Comments:   Drink plenty of fluids.  Prune juice may be helpful.  You may use a stool softener, such as Colace (over the counter) 100 mg twice a day.  Use MiraLax (over the counter) for constipation as needed.   Increase activity slowly as tolerated      Discharge instructions      Comments:   Keep short leg splint and dressing dry. May use water impervious bag or cast bag and tub chair to shower Tape the top of bag to skin to avoid moisture soaking the dressing on the leg. Call if there is odor or saturation of dressing or worsening pain not controlled with medications. Call if fever greater than 101.5. Use crutches or walker no weight bearing on the ankle fracture  leg. Please follow up with an appointment with Dr. Otelia Sergeant  2 weeks from the time of surgery. Elevate as often as possible during the first week after surgery gradually increasing the time the leg is dependent or down there after. If swelling recurrs then elevate again. Wheel chair for longer distances. Take Aspirin 2 x a day with meal or snack Keep cast dry and elevate on pillows for the first 3 days.Marland Kitchen   Discharge instructions      Comments:   DIET: Dysphagia 2 (Fine chop);Thin liquid . With diabetic and renal modifications. Please follow instructions provided by the Speech Therapist.       Medication List     As of 01/08/2012 12:28 AM    STOP taking these medications         amLODipine 5 MG tablet   Commonly known as: NORVASC      GLIPIZIDE XL 10 MG 24 hr tablet   Generic drug: glipiZIDE      oxyCODONE-acetaminophen 5-325 MG per tablet   Commonly known as: PERCOCET/ROXICET  ramipril 5 MG capsule   Commonly known as: ALTACE      TAKE these medications         aspirin EC 325 MG tablet   Take 1 tablet (325 mg total) by mouth 2 (two) times daily after a meal.      atorvastatin 10 MG tablet   Commonly known as: LIPITOR   Take 1 tablet by mouth Daily.      carbidopa-levodopa 25-100 MG per tablet   Commonly known as: SINEMET IR   Take 1.5 tablets by mouth 3 (three) times daily.      carvedilol 6.25 MG tablet   Commonly known as: COREG   Take 1 tablet (6.25 mg total) by mouth 2 (two) times daily with a meal.      donepezil 10 MG tablet   Commonly known as: ARICEPT   Take 1 tablet by mouth Daily.      fexofenadine 180 MG tablet   Commonly known as: ALLEGRA   Take 180 mg by mouth daily.      FLUoxetine 40 MG capsule   Commonly known as: PROZAC   Take 1 capsule by mouth Daily.      FOSRENOL 500 MG chewable tablet   Generic drug: lanthanum   Chew 1 tablet by mouth 3 (three) times daily with meals. Also takes with snacks      HYDROcodone-acetaminophen 5-325 MG  per tablet   Commonly known as: NORCO/VICODIN   Take 1-2 tablets by mouth every 6 (six) hours as needed.      insulin glargine 100 UNIT/ML injection   Commonly known as: LANTUS   Inject 4 Units into the skin at bedtime.      insulin lispro 100 UNIT/ML injection   Commonly known as: HUMALOG   Inject into the skin 3 (three) times daily before meals. As directed per sliding scale      levothyroxine 25 MCG tablet   Commonly known as: SYNTHROID, LEVOTHROID   Take 25 mcg by mouth every morning.      loperamide 2 MG capsule   Commonly known as: IMODIUM   Take 2 mg by mouth daily as needed. For anti-diarrheal      multivitamin Tabs tablet   Take 1 tablet by mouth daily.      sevelamer 800 MG tablet   Commonly known as: RENVELA   Take 800-1,600 mg by mouth 3 (three) times daily with meals. Take two with meals and one with snacks      traMADol 50 MG tablet   Commonly known as: ULTRAM   Take 1 tablet (50 mg total) by mouth every 6 (six) hours as needed for pain.         Follow-up Information    Follow up with NITKA,JAMES E, MD. Schedule an appointment as soon as possible for a visit in 2 weeks.   Contact information:   [address] [place] [address] [phone]       Schedule an appointment as soon as possible for a visit with VYAS,DHRUV B., MD.   Contact information:   [address] [place] [address] [phone]       Follow up with [facility] . (Continue Hemodialysis on Mon, Wed and Fri. Next dialysis: 01/08/2012)           The results of significant diagnostics from this hospitalization (including imaging, microbiology, ancillary and laboratory) are listed below for reference.    Significant Diagnostic Studies: Dg Chest 1 View  01/04/2012  *RADIOLOGY REPORT*  Clinical Data: Fall.  CHEST - 1 VIEW  Comparison: 01/30/2011  Findings: Cardiac enlargement.  Negative for heart failure. Negative for pneumonia or effusion.  Extensive  atherosclerotic calcification in the thoracic aorta.  IMPRESSION: No acute cardiopulmonary disease.   Original Report Authenticated By: Janeece Riggers, M.D.    Dg Pelvis 1-2 Views  01/04/2012  *RADIOLOGY REPORT*  Clinical Data: Fall.  Unresponsive  PELVIS - 1-2 VIEW  Comparison: None  Findings: Negative for fracture of the pelvis or hip.  Hip joints appear normal.  There is generalized osteopenia.  Extensive vascular calcification is present.  IMPRESSION: Negative for fracture.   Original Report Authenticated By: Janeece Riggers, M.D.    Dg Tibia/fibula Right  01/04/2012  *RADIOLOGY REPORT*  Clinical Data: Fall  RIGHT TIBIA AND FIBULA - 2 VIEW  Comparison: None.  Findings: Comminuted and mildly displaced fractures of the proximal tibia and fibula.  The tibial fracture  does not extend into the knee joint.  There is generalized osteopenia.  Atherosclerotic calcification.  There is a stent in the popliteal artery.  IMPRESSION: Comminuted and mildly displaced fractures proximal tibia and fibula.   Original Report Authenticated By: Janeece Riggers, M.D.    Ct Head Wo Contrast  01/04/2012  *RADIOLOGY REPORT*  Clinical Data: Fall.  CT HEAD WITHOUT CONTRAST  Technique:  Contiguous axial images were obtained from the base of the skull through the vertex without contrast.  Comparison:  CT head 12/16/2009  Findings: Moderate atrophy and moderate chronic ischemic changes in the white matter bilaterally.  Chronic infarct left basal ganglia is unchanged.  Negative for acute infarct.  Negative for hemorrhage or mass lesion.  No acute skull abnormality.  Paranasal sinuses are clear.  IMPRESSION: Atrophy and chronic ischemic change.  No acute abnormality.   Original Report Authenticated By: Janeece Riggers, M.D.    Ir Removal Tun Cv Cath W/o Fl  12/10/2011  *RADIOLOGY REPORT*  Clinical Data: 78 year old female with end-stage renal disease on hemodialysis.  She has a right IJ approach tunneled hemodialysis catheter which is no  longer needed secondary to functioning left AV fistula.  REMOVAL TUNNELED CENTRAL VENOUS CATHETER  Comparison: Left upper extremity fistulogram 11/28/2011.  Procedure:  The right IJ approach tunneled hemodialysis catheter exit site was sterilely prepped and draped in the usual fashion. Local anesthesia was achieved by infiltration of 1% lidocaine. Using a combination of blunt and sharp surgical dissection, the fabric cuff was freed from the underlying scar tissue.  The catheter was then removed using gentle traction.  Hemostasis was obtained with manual pressure.  A sterile and airtight dressing was then applied to the catheter exit site.  The patient tolerated the procedure well, there is no immediate complication.  IMPRESSION: Successful removal of right IJ approach tunneled hemodialysis catheter.   Original Report Authenticated By: Malachy Moan, M.D.    Dg Tibia/fibula Right Port  01/05/2012  *RADIOLOGY REPORT*  Clinical Data: Post reduction  PORTABLE RIGHT TIBIA AND FIBULA - 2 VIEW  Comparison: 01/04/2012  Findings: Fracture of the proximal tibia and fibula with mild displacement.  An oblique and lateral view were obtained.  AP view was not obtained.  Cast is in place.  IMPRESSION: Fractures of proximal tibia and fibula with mild displacement appearing similar to the prior study.   Original Report Authenticated By: Janeece Riggers, M.D.     Microbiology: No results found for this or any previous visit (from the past 240 hour(s)).   Labs: Basic Metabolic  Panel:  Lab 01/07/12 0708 01/06/12 1340 01/05/12 0555 01/04/12 1555  NA 140 137 134* 136  K 4.2 4.2 4.1 4.4  CL 99 95* 94* 95*  CO2 30 26 28 29   GLUCOSE 151* 291* 133* 121*  BUN 32* 64* 50* 40*  CREATININE 5.86* 9.00* 6.98* 6.00*  CALCIUM 10.0 9.9 10.0 10.4  MG -- -- -- --  PHOS -- 6.6* -- --   Liver Function Tests:  Lab 01/06/12 1340  AST --  ALT --  ALKPHOS --  BILITOT --  PROT --  ALBUMIN 2.4*   No results found for this  basename: LIPASE:5,AMYLASE:5 in the last 168 hours No results found for this basename: AMMONIA:5 in the last 168 hours CBC:  Lab 01/07/12 0708 01/06/12 1340 01/05/12 0555 01/04/12 1555  WBC 8.4 7.4 6.3 8.2  NEUTROABS -- -- -- --  HGB 8.3* 7.9* 8.2* 9.0*  HCT 26.7* 25.4* 26.7* 29.0*  MCV 94.3 91.0 93.0 92.7  PLT 299 305 302 339   Cardiac Enzymes: No results found for this basename: CKTOTAL:5,CKMB:5,CKMBINDEX:5,TROPONINI:5 in the last 168 hours BNP: BNP (last 3 results) No results found for this basename: PROBNP:3 in the last 8760 hours CBG:  Lab 01/07/12 1704 01/07/12 1137 01/07/12 0724 01/06/12 1707 01/06/12 1533  GLUCAP 184* 211* 140* 171* 147*    Anemia Panel: Iron 34, TIBC 150, Saturation ratios: 23.  HbA1C: 6.7  Arranged for DME, HH PT & OT. Discussed with Ortho & Nephro- cleared for d/c home. D/W family.  Signed:  Teretha Chalupa  [facility] 01/08/2012, 12:28 AM

## 2012-01-07 NOTE — Progress Notes (Signed)
Clip process started on 01/06/12. Paperwork faxed to [facility] central admissions.

## 2012-01-07 NOTE — Progress Notes (Signed)
Spoke with Christy at clip office, gave her pts first date of dialysis per [facility] which was 04/30/02. Neysa Bonito states that she will be finishing the clip process soon as pt is ready for D/C per Ulyses Jarred

## 2012-01-09 ENCOUNTER — Ambulatory Visit (HOSPITAL_COMMUNITY): Payer: BC Managed Care – PPO | Admitting: Physical Therapy

## 2012-01-23 ENCOUNTER — Other Ambulatory Visit (HOSPITAL_COMMUNITY): Payer: Self-pay | Admitting: Nephrology

## 2012-01-23 DIAGNOSIS — N186 End stage renal disease: Secondary | ICD-10-CM

## 2012-01-27 ENCOUNTER — Ambulatory Visit (HOSPITAL_COMMUNITY)
Admission: RE | Admit: 2012-01-27 | Discharge: 2012-01-27 | Disposition: A | Discharge status: Home / Self Care | Payer: Medicare Other | Source: Ambulatory Visit | Attending: Nephrology | Admitting: Nephrology

## 2012-01-27 DIAGNOSIS — N186 End stage renal disease: Secondary | ICD-10-CM | POA: Insufficient documentation

## 2012-01-27 DIAGNOSIS — M7989 Other specified soft tissue disorders: Secondary | ICD-10-CM | POA: Insufficient documentation

## 2012-01-27 DIAGNOSIS — I82290 Acute embolism and thrombosis of other thoracic veins: Secondary | ICD-10-CM | POA: Insufficient documentation

## 2012-01-27 MED ORDER — IOHEXOL 300 MG/ML  SOLN
100.0000 mL | Freq: Once | INTRAMUSCULAR | Status: AC | PRN
  Administered 2012-01-27: 50 mL via INTRAVENOUS

## 2012-01-27 NOTE — Procedures (Signed)
LUE SHUNTOGRAM unsuccessful attempt at LT INNOMINATE RECANALIZATION NO COMP STABLE

## 2012-01-28 ENCOUNTER — Telehealth (HOSPITAL_COMMUNITY): Payer: Self-pay | Admitting: *Deleted

## 2012-01-28 NOTE — Telephone Encounter (Signed)
Pt in dialysis this am.  Spoke with family member, says she is doing well.  No concerns or questions at this time.  Will have pt return call if she has any this afternoon.

## 2012-01-31 ENCOUNTER — Other Ambulatory Visit (HOSPITAL_COMMUNITY): Payer: Self-pay | Admitting: Nephrology

## 2012-01-31 ENCOUNTER — Telehealth: Payer: Self-pay | Admitting: Vascular Surgery

## 2012-01-31 DIAGNOSIS — N186 End stage renal disease: Secondary | ICD-10-CM

## 2012-01-31 NOTE — Telephone Encounter (Signed)
Message copied by Margaretmary Eddy on Fri Jan 31, 2012  3:17 PM ------      Message from: Phillips Odor      Created: Fri Jan 31, 2012  1:25 PM      Regarding: needs appt. for eval. for vasc. access       [facility] called to ask about scheduling pt. For appt. To evaluate for new right arm vascular access; also needs vein mapping.  Pt. needs appt. on a Tues or Thurs., due to her dialysis sched.  Requesting schedule week of 02/10/12, if poss.  I will place order in computer for upper extrem. Vein mapping, once I know which MD she will see.  Call [facility] @ [phone] with appt. Please.

## 2012-02-02 ENCOUNTER — Other Ambulatory Visit (HOSPITAL_COMMUNITY): Payer: Self-pay | Admitting: Nephrology

## 2012-02-02 ENCOUNTER — Ambulatory Visit (HOSPITAL_COMMUNITY)
Admission: RE | Admit: 2012-02-02 | Discharge: 2012-02-02 | Disposition: A | Discharge status: Home / Self Care | Payer: Medicare Other | Source: Ambulatory Visit | Attending: Nephrology | Admitting: Nephrology

## 2012-02-02 DIAGNOSIS — I120 Hypertensive chronic kidney disease with stage 5 chronic kidney disease or end stage renal disease: Secondary | ICD-10-CM | POA: Insufficient documentation

## 2012-02-02 DIAGNOSIS — N186 End stage renal disease: Secondary | ICD-10-CM | POA: Insufficient documentation

## 2012-02-02 DIAGNOSIS — Z992 Dependence on renal dialysis: Secondary | ICD-10-CM | POA: Insufficient documentation

## 2012-02-02 DIAGNOSIS — E119 Type 2 diabetes mellitus without complications: Secondary | ICD-10-CM | POA: Insufficient documentation

## 2012-02-02 DIAGNOSIS — Z4901 Encounter for fitting and adjustment of extracorporeal dialysis catheter: Secondary | ICD-10-CM | POA: Insufficient documentation

## 2012-02-02 LAB — POCT I-STAT 4, (NA,K, GLUC, HGB,HCT)
Hemoglobin: 8.8 g/dL — ABNORMAL LOW (ref 12.0–15.0)
Potassium: 5.9 mEq/L — ABNORMAL HIGH (ref 3.5–5.1)

## 2012-02-02 LAB — GLUCOSE, CAPILLARY: Glucose-Capillary: 134 mg/dL — ABNORMAL HIGH (ref 70–99)

## 2012-02-02 MED ORDER — FENTANYL CITRATE 0.05 MG/ML IJ SOLN
INTRAMUSCULAR | Status: AC | PRN
  Administered 2012-02-02: 12.5 ug via INTRAVENOUS

## 2012-02-02 MED ORDER — GELATIN ABSORBABLE 12-7 MM EX MISC
CUTANEOUS | Status: AC
  Filled 2012-02-02: qty 1

## 2012-02-02 MED ORDER — HEPARIN SODIUM (PORCINE) 1000 UNIT/ML IJ SOLN
INTRAMUSCULAR | Status: AC
  Filled 2012-02-02: qty 1

## 2012-02-02 MED ORDER — CEFAZOLIN SODIUM-DEXTROSE 2-3 GM-% IV SOLR: 2.0000 g | Freq: Once | INTRAVENOUS | Status: DC

## 2012-02-02 MED ORDER — MIDAZOLAM HCL 2 MG/2ML IJ SOLN
INTRAMUSCULAR | Status: AC | PRN
  Administered 2012-02-02: 0.5 mg via INTRAVENOUS

## 2012-02-02 MED ORDER — FENTANYL CITRATE 0.05 MG/ML IJ SOLN
INTRAMUSCULAR | Status: AC
  Filled 2012-02-02: qty 4

## 2012-02-02 MED ORDER — MIDAZOLAM HCL 2 MG/2ML IJ SOLN
INTRAMUSCULAR | Status: AC
  Filled 2012-02-02: qty 4

## 2012-02-02 MED ORDER — CEFAZOLIN SODIUM 1-5 GM-% IV SOLN
INTRAVENOUS | Status: AC
  Filled 2012-02-02: qty 100

## 2012-02-02 NOTE — H&P (Signed)
Katrina Meyer is an 78 y.o. female.   Chief Complaint: renal failure, in need of dialysis catheter HPI: Patient with ESRD, left arm AVF and recent failed attempt at recanalization of chronic left innominate vein occlusion presents today for HD catheter placement.  Past Medical History  Diagnosis Date  . Hyperlipidemia   . Hypertension   . Diabetes mellitus   . DVT (deep venous thrombosis)   . End stage renal disease on dialysis   . Stroke   . Dementia   . Bone fracture 01/04/12    Fracture- rt tib fib    Past Surgical History  Procedure Date  . Dg av dialysis graft declot or   . Av fistula repair Aug. 2013    Left arm    Family History  Problem Relation Age of Onset  . Stroke Mother   . Diabetes Mother   . Cancer Mother   . Hypertension Mother   . Coronary artery disease Father   . Diabetes Sister    Social History:  reports that she has never smoked. She does not have any smokeless tobacco history on file. She reports that she does not drink alcohol or use illicit drugs.  Allergies: No Known Allergies  Current outpatient prescriptions:aspirin EC 325 MG tablet, Take 1 tablet (325 mg total) by mouth 2 (two) times daily after a meal., Disp: 30 tablet, Rfl: 0;  atorvastatin (LIPITOR) 10 MG tablet, Take 1 tablet by mouth Daily., Disp: , Rfl: ;  carbidopa-levodopa (SINEMET IR) 25-100 MG per tablet, Take 1.5 tablets by mouth 3 (three) times daily. , Disp: , Rfl:  carvedilol (COREG) 6.25 MG tablet, Take 1 tablet (6.25 mg total) by mouth 2 (two) times daily with a meal., Disp: 60 tablet, Rfl: 0;  donepezil (ARICEPT) 10 MG tablet, Take 1 tablet by mouth Daily., Disp: , Rfl: ;  fexofenadine (ALLEGRA) 180 MG tablet, Take 180 mg by mouth daily., Disp: , Rfl: ;  FLUoxetine (PROZAC) 40 MG capsule, Take 1 capsule by mouth Daily., Disp: , Rfl:  FOSRENOL 500 MG chewable tablet, Chew 1 tablet by mouth 3 (three) times daily with meals. Also takes with snacks, Disp: , Rfl: ;   HYDROcodone-acetaminophen (NORCO/VICODIN) 5-325 MG per tablet, Take 1-2 tablets by mouth every 6 (six) hours as needed., Disp: 50 tablet, Rfl: 1;  insulin glargine (LANTUS) 100 UNIT/ML injection, Inject 4 Units into the skin at bedtime. , Disp: , Rfl:  insulin lispro (HUMALOG) 100 UNIT/ML injection, Inject into the skin 3 (three) times daily before meals. As directed per sliding scale, Disp: , Rfl: ;  levothyroxine (SYNTHROID, LEVOTHROID) 25 MCG tablet, Take 25 mcg by mouth every morning., Disp: , Rfl: ;  loperamide (IMODIUM) 2 MG capsule, Take 2 mg by mouth daily as needed. For anti-diarrheal, Disp: , Rfl: ;  multivitamin (RENA-VIT) TABS tablet, Take 1 tablet by mouth daily., Disp: , Rfl:  sevelamer (RENVELA) 800 MG tablet, Take 800-1,600 mg by mouth 3 (three) times daily with meals. Take two with meals and one with snacks, Disp: , Rfl: ;  traMADol (ULTRAM) 50 MG tablet, Take 1 tablet (50 mg total) by mouth every 6 (six) hours as needed for pain., Disp: 50 tablet, Rfl: 1 Current facility-administered medications:ceFAZolin (ANCEF) IVPB 2 g/50 mL premix, 2 g, Intravenous, Once, Malachy Moan, MD   K+  5.9 (02/02/2012) Review of Systems  Constitutional: Negative for fever and chills.  Respiratory: Negative for cough and shortness of breath.   Cardiovascular: Negative for chest pain.  Gastrointestinal: Negative for nausea, vomiting and abdominal pain.  Musculoskeletal: Negative for back pain.       Left arm swelling  Neurological: Negative for headaches.    Blood pressure 218/79, pulse 70, temperature 98.5 F (36.9 C), temperature source Oral, SpO2 100.00%. Physical Exam  Constitutional: She appears well-developed and well-nourished.  Cardiovascular: Normal rate.   Murmur heard.      occ ectopy  Respiratory: Effort normal.       CTA bilat anteriorly  GI: Soft. Bowel sounds are normal. There is no tenderness.  Musculoskeletal:       Left arm AVF with mod edema, + thrill/bruit      Assessment/Plan Pt with ESRD, left arm AVF and chronic occlusion of left innominate vein with recent failed attempt at recanalization. Plan is for placement of HD catheter today. Details of above d/w pt/pt's daughter with their understanding and consent.  ALLRED,D KEVIN 02/02/2012, 8:41 AM

## 2012-02-02 NOTE — ED Notes (Signed)
Pt picked up by Transport and will be taken to have dialysis today.  Pt is escorted by her daughter

## 2012-02-02 NOTE — H&P (Signed)
Agree with PA note.    Signed,  Heath K. McCullough, MD Vascular & Interventional Radiologist [facility]  

## 2012-02-02 NOTE — Procedures (Signed)
Interventional Radiology Procedure Note  Procedure: Right IJ 23 cm tip-to-cuff HemoSplit tunneled HD catheter.  Tip in upper RA and ready for immediate use.  Complications: None Recommendations: - Routine line care  Signed,  Sterling Big, MD Vascular & Interventional Radiologist [facility]

## 2012-02-02 NOTE — ED Notes (Signed)
HD Cath placement site is clean, dry and intact

## 2012-02-11 ENCOUNTER — Ambulatory Visit: Payer: Medicare Other | Admitting: Vascular Surgery

## 2012-02-12 ENCOUNTER — Encounter: Payer: Self-pay | Admitting: Vascular Surgery

## 2012-02-13 ENCOUNTER — Ambulatory Visit: Payer: Medicare Other | Admitting: Vascular Surgery

## 2012-02-17 ENCOUNTER — Encounter: Payer: Self-pay | Admitting: Vascular Surgery

## 2012-02-18 ENCOUNTER — Ambulatory Visit (INDEPENDENT_AMBULATORY_CARE_PROVIDER_SITE_OTHER): Payer: Medicare Other | Admitting: Vascular Surgery

## 2012-02-18 ENCOUNTER — Encounter: Payer: Self-pay | Admitting: Vascular Surgery

## 2012-02-18 ENCOUNTER — Encounter: Payer: Self-pay | Admitting: *Deleted

## 2012-02-18 VITALS — BP 202/72 | HR 64 | Ht 68.0 in | Wt 189.0 lb

## 2012-02-18 DIAGNOSIS — N186 End stage renal disease: Secondary | ICD-10-CM

## 2012-02-18 DIAGNOSIS — Z01810 Encounter for preprocedural cardiovascular examination: Secondary | ICD-10-CM

## 2012-02-18 NOTE — Progress Notes (Signed)
The patient presents today for evaluation of AV access. He had been dialyzed via a left arm access. 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 She centrally underwent placement of a right IJ hemodialysis catheter which is currently using. She is here today with her family. She has had a major stroke wheelchair and is difficult to communicate. She did have a prior right arm access at [facility]. By physical exam this appears to have been attempted basilic vein transposition. He does have a 2+ brachial pulse and a faint radial pulse on the right.  Past Medical History  Diagnosis Date  . Hyperlipidemia   . Hypertension   . Diabetes mellitus   . DVT (deep venous thrombosis)   . End stage renal disease on dialysis   . Stroke   . Dementia   . Bone fracture 01/04/12    Fracture- rt tib fib    History  Substance Use Topics  . Smoking status: Never Smoker   . Smokeless tobacco: Never Used  . Alcohol Use: No    Family History  Problem Relation Age of Onset  . Stroke Mother   . Diabetes Mother   . Cancer Mother   . Hypertension Mother   . Coronary artery disease Father   . Diabetes Sister     No Known Allergies  Current outpatient prescriptions:aspirin EC 325 MG tablet, Take 1 tablet (325 mg total) by mouth 2 (two) times daily after a meal., Disp: 30 tablet, Rfl: 0;  atorvastatin (LIPITOR) 10 MG tablet, Take 1 tablet by mouth Daily., Disp: , Rfl: ;  carbidopa-levodopa (SINEMET IR) 25-100 MG per tablet, Take 1.5 tablets by mouth 3 (three) times daily. , Disp: , Rfl:  carvedilol (COREG) 6.25 MG tablet, Take 1 tablet (6.25 mg total) by mouth 2 (two) times daily with a meal., Disp: 60 tablet, Rfl: 0;  donepezil (ARICEPT) 10 MG tablet, Take 1 tablet by mouth Daily., Disp: , Rfl: ;  fexofenadine (ALLEGRA) 180 MG tablet, Take 180 mg by mouth daily., Disp: , Rfl: ;  FLUoxetine (PROZAC) 40 MG capsule, Take 1 capsule  by mouth Daily., Disp: , Rfl:  FOSRENOL 500 MG chewable tablet, Chew 1 tablet by mouth 3 (three) times daily with meals. Also takes with snacks, Disp: , Rfl: ;  insulin glargine (LANTUS) 100 UNIT/ML injection, Inject 4 Units into the skin at bedtime. , Disp: , Rfl: ;  insulin lispro (HUMALOG) 100 UNIT/ML injection, Inject into the skin 3 (three) times daily before meals. As directed per sliding scale, Disp: , Rfl:  multivitamin (RENA-VIT) TABS tablet, Take 1 tablet by mouth daily., Disp: , Rfl: ;  sevelamer (RENVELA) 800 MG tablet, Take 800-1,600 mg by mouth 3 (three) times daily with meals. Take two with meals and one with snacks, Disp: , Rfl: ;  HYDROcodone-acetaminophen (NORCO/VICODIN) 5-325 MG per tablet, Take 1-2 tablets by mouth every 6 (six) hours as needed., Disp: 50 tablet, Rfl: 1 levothyroxine (SYNTHROID, LEVOTHROID) 25 MCG tablet, Take 25 mcg by mouth every morning., Disp: , Rfl: ;  loperamide (IMODIUM) 2 MG capsule, Take 2 mg by mouth daily as needed. For anti-diarrheal, Disp: , Rfl: ;  traMADol (ULTRAM) 50 MG tablet, Take 1 tablet (50 mg total) by mouth every 6 (six) hours as needed for pain., Disp: 50 tablet, Rfl: 1  BP 202/72  Pulse 64  Ht 5\' 8"  (1.727 m)  Wt 189 lb (85.73 kg)  BMI  28.74 kg/m2  SpO2 100%  Body mass index is 28.74 kg/(m^2).       Physical exam well-developed female sitting in a wheelchair able to answer yes or no questions. Right arm does show very small surface veins in the prior scar on the medial aspect of her upper arm presumably from basilic vein harvest.  Vascular lab studies in our office reveal a no identification of basilic vein on the right and very small cephalic vein throughout its course.  Impression and plan need for new hemodialysis access with a functioning hemodialysis catheter. After discussion with the family present and the patient appeared to have recommended right arm AV Gore-Tex graft. This will most likely require an upper arm since she  does have small veins at the antecubital level. She dialyzes on Monday Wednesday and Friday to this will be scheduled on a Tuesday or Thursday as an outpatient at her convenience

## 2012-02-18 NOTE — Progress Notes (Signed)
Bilateral upper extremity venous mapping for dialysis access performed @ [facility] 02/18/2012

## 2012-02-19 ENCOUNTER — Other Ambulatory Visit: Payer: Self-pay | Admitting: *Deleted

## 2012-02-26 ENCOUNTER — Inpatient Hospital Stay (HOSPITAL_COMMUNITY): Payer: Medicare Other

## 2012-02-26 ENCOUNTER — Inpatient Hospital Stay (HOSPITAL_COMMUNITY)
Admission: AD | Admit: 2012-02-26 | Discharge: 2012-02-27 | DRG: 592 | Disposition: A | Discharge status: Home Health | Payer: Medicare Other | Source: Ambulatory Visit | Attending: Specialist | Admitting: Specialist

## 2012-02-26 DIAGNOSIS — E785 Hyperlipidemia, unspecified: Secondary | ICD-10-CM | POA: Diagnosis present

## 2012-02-26 DIAGNOSIS — L89609 Pressure ulcer of unspecified heel, unspecified stage: Secondary | ICD-10-CM | POA: Diagnosis present

## 2012-02-26 DIAGNOSIS — L97909 Non-pressure chronic ulcer of unspecified part of unspecified lower leg with unspecified severity: Secondary | ICD-10-CM

## 2012-02-26 DIAGNOSIS — Z992 Dependence on renal dialysis: Secondary | ICD-10-CM

## 2012-02-26 DIAGNOSIS — Z4789 Encounter for other orthopedic aftercare: Secondary | ICD-10-CM

## 2012-02-26 DIAGNOSIS — E119 Type 2 diabetes mellitus without complications: Secondary | ICD-10-CM | POA: Diagnosis present

## 2012-02-26 DIAGNOSIS — F0390 Unspecified dementia without behavioral disturbance: Secondary | ICD-10-CM | POA: Diagnosis present

## 2012-02-26 DIAGNOSIS — L97509 Non-pressure chronic ulcer of other part of unspecified foot with unspecified severity: Principal | ICD-10-CM | POA: Diagnosis present

## 2012-02-26 DIAGNOSIS — N186 End stage renal disease: Secondary | ICD-10-CM | POA: Diagnosis present

## 2012-02-26 DIAGNOSIS — L89601 Pressure ulcer of unspecified heel, stage 1: Secondary | ICD-10-CM | POA: Diagnosis present

## 2012-02-26 DIAGNOSIS — I998 Other disorder of circulatory system: Secondary | ICD-10-CM

## 2012-02-26 DIAGNOSIS — Z86718 Personal history of other venous thrombosis and embolism: Secondary | ICD-10-CM

## 2012-02-26 DIAGNOSIS — Z8673 Personal history of transient ischemic attack (TIA), and cerebral infarction without residual deficits: Secondary | ICD-10-CM

## 2012-02-26 DIAGNOSIS — D631 Anemia in chronic kidney disease: Secondary | ICD-10-CM | POA: Diagnosis present

## 2012-02-26 DIAGNOSIS — L02619 Cutaneous abscess of unspecified foot: Secondary | ICD-10-CM | POA: Diagnosis present

## 2012-02-26 DIAGNOSIS — I120 Hypertensive chronic kidney disease with stage 5 chronic kidney disease or end stage renal disease: Secondary | ICD-10-CM | POA: Diagnosis present

## 2012-02-26 DIAGNOSIS — L8991 Pressure ulcer of unspecified site, stage 1: Secondary | ICD-10-CM | POA: Diagnosis present

## 2012-02-26 DIAGNOSIS — L03119 Cellulitis of unspecified part of limb: Secondary | ICD-10-CM | POA: Diagnosis present

## 2012-02-26 DIAGNOSIS — N039 Chronic nephritic syndrome with unspecified morphologic changes: Secondary | ICD-10-CM | POA: Diagnosis present

## 2012-02-26 DIAGNOSIS — I739 Peripheral vascular disease, unspecified: Secondary | ICD-10-CM | POA: Diagnosis present

## 2012-02-26 DIAGNOSIS — N2581 Secondary hyperparathyroidism of renal origin: Secondary | ICD-10-CM | POA: Diagnosis present

## 2012-02-26 LAB — CBC
HCT: 32.9 % — ABNORMAL LOW (ref 36.0–46.0)
Hemoglobin: 10.4 g/dL — ABNORMAL LOW (ref 12.0–15.0)
MCV: 99.1 fL (ref 78.0–100.0)
RBC: 3.32 MIL/uL — ABNORMAL LOW (ref 3.87–5.11)
RDW: 19.3 % — ABNORMAL HIGH (ref 11.5–15.5)
WBC: 8.1 10*3/uL (ref 4.0–10.5)

## 2012-02-26 LAB — RENAL FUNCTION PANEL
Albumin: 2.6 g/dL — ABNORMAL LOW (ref 3.5–5.2)
BUN: 38 mg/dL — ABNORMAL HIGH (ref 6–23)
Chloride: 99 mEq/L (ref 96–112)
Creatinine, Ser: 6.19 mg/dL — ABNORMAL HIGH (ref 0.50–1.10)
Glucose, Bld: 322 mg/dL — ABNORMAL HIGH (ref 70–99)

## 2012-02-26 LAB — PROTIME-INR: Prothrombin Time: 14.1 seconds (ref 11.6–15.2)

## 2012-02-26 MED ORDER — SODIUM CHLORIDE 0.9 % IV SOLN
62.5000 mg | INTRAVENOUS | Status: DC
  Administered 2012-02-26: 62.5 mg via INTRAVENOUS
  Filled 2012-02-26: qty 5

## 2012-02-26 MED ORDER — DONEPEZIL HCL 10 MG PO TABS
10.0000 mg | ORAL_TABLET | Freq: Every day | ORAL | Status: DC
  Administered 2012-02-26: 10 mg via ORAL
  Filled 2012-02-26 (×2): qty 1

## 2012-02-26 MED ORDER — SEVELAMER CARBONATE 800 MG PO TABS
800.0000 mg | ORAL_TABLET | Freq: Three times a day (TID) | ORAL | Status: DC
  Administered 2012-02-27: 1600 mg via ORAL
  Filled 2012-02-26 (×5): qty 2

## 2012-02-26 MED ORDER — LANTHANUM CARBONATE 500 MG PO CHEW
500.0000 mg | CHEWABLE_TABLET | Freq: Three times a day (TID) | ORAL | Status: DC
  Administered 2012-02-26 – 2012-02-27 (×2): 500 mg via ORAL
  Filled 2012-02-26 (×5): qty 1

## 2012-02-26 MED ORDER — CARVEDILOL 6.25 MG PO TABS
6.2500 mg | ORAL_TABLET | Freq: Two times a day (BID) | ORAL | Status: DC
  Administered 2012-02-27 (×2): 6.25 mg via ORAL
  Filled 2012-02-26 (×4): qty 1

## 2012-02-26 MED ORDER — ASPIRIN EC 325 MG PO TBEC
325.0000 mg | DELAYED_RELEASE_TABLET | Freq: Two times a day (BID) | ORAL | Status: DC
  Administered 2012-02-26 – 2012-02-27 (×3): 325 mg via ORAL
  Filled 2012-02-26 (×4): qty 1

## 2012-02-26 MED ORDER — DARBEPOETIN ALFA-POLYSORBATE 100 MCG/0.5ML IJ SOLN
100.0000 ug | INTRAMUSCULAR | Status: DC
  Administered 2012-02-26: 100 ug via INTRAVENOUS

## 2012-02-26 MED ORDER — LOPERAMIDE HCL 2 MG PO CAPS
2.0000 mg | ORAL_CAPSULE | Freq: Every day | ORAL | Status: DC | PRN
  Filled 2012-02-26: qty 1

## 2012-02-26 MED ORDER — LEVOTHYROXINE SODIUM 25 MCG PO TABS
25.0000 ug | ORAL_TABLET | Freq: Every day | ORAL | Status: DC
  Administered 2012-02-27: 25 ug via ORAL
  Filled 2012-02-26 (×2): qty 1

## 2012-02-26 MED ORDER — ATORVASTATIN CALCIUM 10 MG PO TABS
10.0000 mg | ORAL_TABLET | Freq: Every day | ORAL | Status: DC
  Administered 2012-02-26 – 2012-02-27 (×2): 10 mg via ORAL
  Filled 2012-02-26 (×2): qty 1

## 2012-02-26 MED ORDER — INSULIN GLARGINE 100 UNIT/ML ~~LOC~~ SOLN
4.0000 [IU] | Freq: Every day | SUBCUTANEOUS | Status: DC
  Administered 2012-02-26: 4 [IU] via SUBCUTANEOUS

## 2012-02-26 MED ORDER — HYDROCODONE-ACETAMINOPHEN 5-325 MG PO TABS: 1.0000 | ORAL_TABLET | Freq: Four times a day (QID) | ORAL | Status: DC | PRN

## 2012-02-26 MED ORDER — INSULIN ASPART 100 UNIT/ML ~~LOC~~ SOLN
0.0000 [IU] | Freq: Three times a day (TID) | SUBCUTANEOUS | Status: DC
  Administered 2012-02-26: 5 [IU] via SUBCUTANEOUS
  Administered 2012-02-27: 3 [IU] via SUBCUTANEOUS
  Administered 2012-02-27: 8 [IU] via SUBCUTANEOUS

## 2012-02-26 MED ORDER — DARBEPOETIN ALFA-POLYSORBATE 100 MCG/0.5ML IJ SOLN
INTRAMUSCULAR | Status: AC
  Administered 2012-02-26: 100 ug via INTRAVENOUS
  Filled 2012-02-26: qty 0.5

## 2012-02-26 MED ORDER — MORPHINE SULFATE 2 MG/ML IJ SOLN: 0.5000 mg | INTRAMUSCULAR | Status: DC | PRN

## 2012-02-26 MED ORDER — CARBIDOPA-LEVODOPA 25-100 MG PO TABS
1.5000 | ORAL_TABLET | Freq: Three times a day (TID) | ORAL | Status: DC
Start: 2012-02-26 — End: 2012-02-27
  Administered 2012-02-26 – 2012-02-27 (×3): 1.5 via ORAL
  Filled 2012-02-26 (×5): qty 1.5

## 2012-02-26 MED ORDER — LORATADINE 10 MG PO TABS
10.0000 mg | ORAL_TABLET | Freq: Every day | ORAL | Status: DC
  Administered 2012-02-27: 10 mg via ORAL
  Filled 2012-02-26: qty 1

## 2012-02-26 MED ORDER — RENA-VITE PO TABS
1.0000 | ORAL_TABLET | Freq: Every day | ORAL | Status: DC
  Administered 2012-02-27: 1 via ORAL
  Filled 2012-02-26: qty 1

## 2012-02-26 MED ORDER — CEPHALEXIN 250 MG PO CAPS
250.0000 mg | ORAL_CAPSULE | Freq: Two times a day (BID) | ORAL | Status: DC
  Administered 2012-02-26 – 2012-02-27 (×2): 250 mg via ORAL
  Filled 2012-02-26 (×3): qty 1

## 2012-02-26 NOTE — Progress Notes (Signed)
Patient ID: Katrina Meyer, female   DOB: 1932-07-31, 79 y.o.   MRN: [id number] Subjective:    Awake, alert, Oriented x 4. Patient seen by Dr. Hart Rochester and his PA and felt to have good peripheral circulation. Right foot is showing discoloration of the great toe and 2nd toe. There is the open bulla over the medial right great Toe. This may represent a cellulitis. Will start po keflex and will ask for pharmacy assistance in dosing..  Patient reports pain as mild.    Objective:   VITALS:  Temp:  [97 F (36.1 C)-98.2 F (36.8 C)] 97 F (36.1 C) (01/22 2034) Pulse Rate:  [62-91] 74  (01/22 2034) Resp:  [15-18] 16  (01/22 2034) BP: (97-241)/(60-96) 165/87 mmHg (01/22 2034) SpO2:  [97 %-100 %] 97 % (01/22 2034) Weight:  [73.5 kg (162 lb 0.6 oz)-78.3 kg (172 lb 9.9 oz)] 73.5 kg (162 lb 0.6 oz) (01/22 2034)  Neurologically intact ABD soft Dorsiflexion/Plantar flexion intact erythrema and discoloration of the right forefoot medially.   LABS No results found for this basename: HGB:5,WBC:2,PLT:2 in the last 72 hours  Basename 02/26/12 1651  NA 142  K 4.1  CL 99  CO2 27  BUN 38*  CREATININE 6.19*  GLUCOSE 322*   No results found for this basename: LABPT:2,INR:2 in the last 72 hours   Assessment/Plan:    Vascular indicates that the circulation of the patients right foot is intact and Possible localized cellulitis causing discoloration of the right medial forefoot without swelling Open sore right great toe.  Advance diet Will start oral keflex for the right forefoot changes. Plan to apply a new long leg cast tomorrow and  Then discharge home in the afternoon if stable and no Further skin changes.   Quinten Allerton E 02/26/2012, 9:17 PM

## 2012-02-26 NOTE — Progress Notes (Signed)
*  PRELIMINARY RESULTS* Vascular Ultrasound Right Lower Extremity Arterial Duplex has been completed.  Study was technically limited due to patient immobility. There is no obvious evidence of right lower extremity stenosis involving the right common femoral, femoral, popliteal, anterior tibial, and dorsalis pedis arteries. The right posterior tibial artery appears occluded. Unable to insonate the right peroneal artery, however this may be due to increased swelling. Preliminary results discussed with Dr.Lawson.  02/26/2012 7:09 PM Gertie Fey, RDMS, RDCS

## 2012-02-26 NOTE — Procedures (Signed)
I have personally attended this patient's dialysis session.  Permcath Qb 400.  153/81 VP<200.  Katrina Meyer B

## 2012-02-26 NOTE — Progress Notes (Signed)
Orthopedic Tech Progress Note Patient Details:  Katrina Meyer 15-Nov-1932 [id number]  Patient ID: Pricilla Handler, female   DOB: 1932-06-21, 79 y.o.   MRN: [id number] Trapeze bar patient helper  Nikki Dom 02/26/2012, 10:48 PM

## 2012-02-26 NOTE — Consult Note (Signed)
[facility] CONSULT NOTE 02/26/2012 DOB: 08/18/2032 MRN : [id number]  [id number] great toe ulcer Referring Physician:Dr. Otelia Sergeant  History of Present Illness: Seen at [facility] 01/03/2012 with a right proximal tibia fracture and placed in a long leg  Cast. Returned to office for follow up two weeks ago, 2 weeks late and had cast removed and leg evaluated  Good alignment and a new long leg cast applied. One week ago the cast broke at the level of the knee  And required reinforcement. Family has noticed right foot great toe changes since after the first change of  Cast. Received a call from the family today indicating that the right foot is turning black.Seen in the office  Today and the right foot shows dusky appearance of the medial 3 toes with an open blister that is weeping.   Dr. Otelia Sergeant wants [place] to evaluate her for right great toe ulcerand to evaluate arterial flow. She had a stent placed in the right popliteal artery  06/2010 Dr. Darrick Penna.    Past Medical History  Diagnosis Date  . Hyperlipidemia   . Hypertension   . Diabetes mellitus   . DVT (deep venous thrombosis)   . End stage renal disease on dialysis   . Stroke   . Dementia   . Bone fracture 01/04/12    Fracture- rt tib fib    Past Surgical History  Procedure Date  . Dg av dialysis graft declot or   . Av fistula repair Aug. 2013    Left arm     ROS: [x]  Positive  [ ]  Denies    General: [ ]  Weight loss, [ ]  Fever, [ ]  chills Neurologic: [ ]  Dizziness, [ ]  Blackouts, [ ]  Seizure [ ]  Stroke, [ ]  "Mini stroke", [ ]  Slurred speech, [ ]  Temporary blindness; [ ]  weakness in arms or legs, [ ]  Hoarseness Cardiac: [ ]  Chest pain/pressure, [ ]  Shortness of breath at rest [ ]  Shortness of breath with exertion, [ ]  Atrial fibrillation or irregular heartbeat Vascular: [ ]  Pain in legs with walking, [ ]  Pain in legs at rest, [ ]  Pain in legs at night,  [ ]  Non-healing ulcer, [ ]  Blood clot in vein/DVT,     Pulmonary: [ ]  Home oxygen, [ ]  Productive cough, [ ]  Coughing up blood, [ ]  Asthma,  [ ]  Wheezing Musculoskeletal:  [x ] Arthritis, [ ]  Low back pain, [ ]  Joint pain Hematologic: [ ]  Easy Bruising, [ ]  Anemia; [ ]  Hepatitis Gastrointestinal: [ ]  Blood in stool, [ ]  Gastroesophageal Reflux/heartburn, [ ]  Trouble swallowing Urinary: [x ] chronic Kidney disease, [x ] on HD - [x ] MWF or [ ]  TTHS, [ ]  Burning with urination, [ ]  Difficulty urinating Skin: [x ] Rashes, [ ]  Wounds Psychological: [ ]  Anxiety, [ ]  Depression  Social History History  Substance Use Topics  . Smoking status: Never Smoker   . Smokeless tobacco: Never Used  . Alcohol Use: No    Family History Family History  Problem Relation Age of Onset  . Stroke Mother   . Diabetes Mother   . Cancer Mother   . Hypertension Mother   . Coronary artery disease Father   . Diabetes Sister     No Known Allergies  No current facility-administered medications for this encounter.     Imaging: No results found.  Significant Diagnostic Studies: CBC Lab Results  Component Value Date   WBC 8.4 01/07/2012  HGB 8.8* 02/02/2012   HCT 26.0* 02/02/2012   MCV 94.3 01/07/2012   PLT 299 01/07/2012    BMET    Component Value Date/Time   NA 136 02/02/2012 0850   K 5.9* 02/02/2012 0850   CL 99 01/07/2012 0708   CO2 30 01/07/2012 0708   GLUCOSE 130* 02/02/2012 0850   BUN 32* 01/07/2012 0708   CREATININE 5.86* 01/07/2012 0708   CALCIUM 10.0 01/07/2012 0708   GFRNONAA 6* 01/07/2012 0708   GFRAA 7* 01/07/2012 0708    COAG Lab Results  Component Value Date   INR 1.13 01/04/2012   No results found for this basename: PTT     Physical Examination BP Readings from Last 3 Encounters:  02/18/12 202/72  02/02/12 204/63  01/07/12 137/90   Temp Readings from Last 3 Encounters:  02/02/12 98.5 F (36.9 C) Oral  01/07/12 98 F (36.7 C)   11/26/11 98 F (36.7 C) Oral   SpO2 Readings from Last 3 Encounters:  02/18/12 100%   02/02/12 100%  01/07/12 100%   Pulse Readings from Last 3 Encounters:  02/18/12 64  02/02/12 70  01/07/12 79    General:  WDWN in NAD Gait: WC secondary to right tibial fracture HENT: WNL Eyes: Pupils equal Pulmonary: normal non-labored breathingCardiac: RRR, without  Murmurs, rubs or gallops; Skin: no rashes, ulcers tip and plantar surface of the right great toe. Vascular Exam/Pulses:Doppler signal anterior tibial artery and peroneal.  Cap refill is brisk.  Skin is warm to touch.  Active range of motion of toes is intact.   Musculoskeletal: no muscle wasting or atrophy  Neurologic: A&O X 3; Appropriate Affect ;  SENSATION: normal;  Non-Invasive Vascular Imaging: We will order right lower extremity duplex today. ( Pending)  ASSESSMENT/PLAN: This appears to be a pressure ulcer from the cast. Strong doppler signal present on exam. We suspect the popliteal and anterior tibialis arteries to be patient.   The patient was seen with Dr. Josephina Gip today He will review the duplex  study when it is complete. COLLINS, EMMA MAUREEN PA-C   Agree with the above assessment Right foot well-perfused but does have ulceration on right first toe-possibly pressure sore  Duplex scan performed which revealed widely patent stent in right popliteal artery. Patient has 2 vessel runoff through disease anterior tibial and peroneal artery-not amenable to further intervention Flow and anterior tibial artery at foot level is brisk No further vascular workup indicated-hopefully pressure sore will heal with time.

## 2012-02-26 NOTE — Progress Notes (Signed)
ANTIBIOTIC CONSULT NOTE - INITIAL  Pharmacy Consult for Keflex Indication: Foot cellulitis in patient with ESRD No Known Allergies Patient Measurements: Weight: 162 lb 0.6 oz (73.5 kg) (cast removed and impacted the post wt) Vital Signs: Temp: 97 F (36.1 C) (01/22 2034) Temp src: Oral (01/22 2034) BP: 165/87 mmHg (01/22 2034) Pulse Rate: 74  (01/22 2034) Intake/Output from previous day:   Intake/Output from this shift: Total I/O In: -  Out: 2290 [Other:2290] Labs:  [facility] 02/26/12 1651  WBC --  HGB --  PLT --  LABCREA --  CREATININE 6.19*   The CrCl is unknown because both a height and weight (above a minimum accepted value) are required for this calculation. No results found for this basename: VANCOTROUGH:2,VANCOPEAK:2,VANCORANDOM:2,GENTTROUGH:2,GENTPEAK:2,GENTRANDOM:2,TOBRATROUGH:2,TOBRAPEAK:2,TOBRARND:2,AMIKACINPEAK:2,AMIKACINTROU:2,AMIKACIN:2, in the last 72 hours   Microbiology: No results found for this or any previous visit (from the past 720 hour(s)).  Medical History: Past Medical History  Diagnosis Date  . Hyperlipidemia   . Hypertension   . Diabetes mellitus   . DVT (deep venous thrombosis)   . End stage renal disease on dialysis   . Stroke   . Dementia   . Bone fracture 01/04/12    Fracture- rt tib fib   Assessment: 81 YOF with hx HTN, ESRD on HD admitted with discoloration of R-great and 2nd toes and open bulla to start Keflex for cellulitis coverage. Plan to discharge in AM. Patient received HD MWF in [place] and is due for new AVG next week. Currently using perm cath for HD. Last HD was today and patient tolerated full session. WBC is wnl. Afebrile. No cultures available.   Goal of Therapy:  Clinical resolution of infection  Plan:  1. Keflex 250mg  po BID.  2. Pharmacy will sign off unless otherwise consulted.   Fayne Norrie 02/26/2012,9:28 PM

## 2012-02-26 NOTE — Progress Notes (Signed)
Orthopedic Tech Progress Note Patient Details:  Katrina Meyer Jun 28, 1932 [id number]  Ortho Devices Type of Ortho Device: Long leg splint Ortho Device/Splint Location: right leg Ortho Device/Splint Interventions: Application Viewed order from doctor's order list  Nikki Dom 02/26/2012, 7:59 PM

## 2012-02-26 NOTE — Consult Note (Signed)
[facility] Renal Consultation Note  Indication for Consultation:  Management of ESRD/hemodialysis; anemia, hypertension/volume and secondary hyperparathyroidism  HPI: Katrina Meyer is a 79 y.o. female with  01/03/2012 right proximal tibia fracture and placed in a long leg cast , she returned to his office for follow up two weeks ago, 2 weeks late and had cast removed and leg evaluated . She had good alignment and a new long leg cast applied.Then per Dr. Otelia Meyer 's historyone week ago the cast broke at the level of the knee and required reinforcement.  Returned to Dr. Barbaraann Meyer office after family noted right foot turning black this am and developing a right toe ulcer.  She has a history of PAD in that leg with   Katrina Meyer 06/24/10 Right Popliteal artery Stenting. Dr,. Meyer consulted [facility] , nowwith Katrina Meyer at bedside and venous doppler ultrasound  giving good signal. Some mild pain at toe ulcer. No fevers, chills, or sweats. no reported  Hd problems at [facility], using perm cath, she had a Right BVT avf placed in [facility] in 2013? Month that didn't  develope. She has been seen at [facility] office for access 02/18/12 Katrina Meyer with plans for new right  arm avgg next week per Katrina Meyer.     Past Medical History  Diagnosis Date  . Hyperlipidemia   . Hypertension   . Diabetes mellitus   . DVT (deep venous thrombosis)   . End stage renal disease on dialysis   . Stroke   . Dementia   . Bone fracture 01/04/12    Fracture- rt tib fib    Past Surgical History  Procedure Date  . Dg av dialysis graft declot or   . Av fistula repair Aug. 2013    Left arm      Family History  Problem Relation Age of Onset  . Stroke Mother   . Diabetes Mother   . Cancer Mother   . Hypertension Mother   . Coronary artery disease Father   . Diabetes Sister   She lives with her daughter and    reports that she has never smoked. She has never used smokeless tobacco. She  reports that she does not drink alcohol or use illicit drugs.  No Known Allergies  Prior to Admission medications   Medication Sig Start Date End Date Taking? Authorizing Provider  aspirin EC 325 MG tablet Take 1 tablet (325 mg total) by mouth 2 (two) times daily after a meal. 01/07/12  Yes Katrina Champagne, MD  atorvastatin (LIPITOR) 10 MG tablet Take 1 tablet by mouth Daily. 10/02/11  Yes Historical Provider, MD  carbidopa-levodopa (SINEMET IR) 25-100 MG per tablet Take 1.5 tablets by mouth 3 (three) times daily.  09/26/11  Yes Historical Provider, MD  carvedilol (COREG) 6.25 MG tablet Take 1 tablet (6.25 mg total) by mouth 2 (two) times daily with a meal. 01/07/12  Yes Katrina Etienne, MD  donepezil (ARICEPT) 10 MG tablet Take 1 tablet by mouth Daily. 10/02/11  Yes Historical Provider, MD  fexofenadine (ALLEGRA) 180 MG tablet Take 180 mg by mouth daily.   Yes Historical Provider, MD  FLUoxetine (PROZAC) 40 MG capsule Take 1 capsule by mouth Daily. 10/02/11  Yes Historical Provider, MD  FOSRENOL 500 MG chewable tablet Chew 1 tablet by mouth 3 (three) times daily with meals. Also takes with snacks 09/03/11  Yes Historical Provider, MD  HYDROcodone-acetaminophen (NORCO/VICODIN) 5-325 MG per tablet Take 1-2 tablets by  mouth every 6 (six) hours as needed. 01/07/12  Yes Katrina Champagne, MD  insulin glargine (LANTUS) 100 UNIT/ML injection Inject 4 Units into the skin at bedtime.    Yes Historical Provider, MD  insulin lispro (HUMALOG) 100 UNIT/ML injection Inject into the skin 3 (three) times daily before meals. As directed per sliding scale   Yes Historical Provider, MD  levothyroxine (SYNTHROID, LEVOTHROID) 25 MCG tablet Take 25 mcg by mouth every morning.   Yes Historical Provider, MD  loperamide (IMODIUM) 2 MG capsule Take 2 mg by mouth daily as needed. For anti-diarrheal   Yes Historical Provider, MD  multivitamin (RENA-VIT) TABS tablet Take 1 tablet by mouth daily.   Yes Historical Provider, MD  sevelamer  (RENVELA) 800 MG tablet Take 800-1,600 mg by mouth 3 (three) times daily with meals. Take two with meals and one with snacks   Yes Historical Provider, MD  traMADol (ULTRAM) 50 MG tablet Take 1 tablet (50 mg total) by mouth every 6 (six) hours as needed for pain. 01/07/12  Yes Katrina Champagne, MD     Anti-infectives    None        [id number]  Reported positives as in HPI    Physical Exam: Filed Vitals:   02/26/12 1500  BP: 200/70  Pulse: 80  Temp: 98.2 F (36.8 C)  Resp: 16     General: elderly thin chronically ill bf nad/ daughter in room I'm trying to find out why I'm here HEENT: [name] MM dry Eyes:  eomi Neck:  Supple no jvd Heart:  Rare irreg beat , heart rate 80s/ soft 1/6 sem lsb/ no rub  Lungs: decreased at bases, otherwise CTA  Abdomen: bs pos. Soft ,nontender Extremities:  No left pedal edema/ Right lower leg cast with right great toe tip ulcer/blister  Toes dusky Skin:  No  Rash nd ulcer as above Neuro: alert,moves all ext, except  Right leg  Dialysis Access: right ij perm cath.  Dialysis Orders: Center: [facility]  on MWF . EDW 77.5 kg HD Bath 2.0 k, ,Ca 2.25  Time 3.5 hr Heparin 2600 units. Access right ij perm cath / clotted  r bvt bap. Med center placed in 2013  BFR 400 DFR 800    Katrina Meyer 2 mcg IV/HD Epogen 13000   Units IV/HD  Venofer   100mg  weekly  Other 0  Assessment/Plan 1. Right Foot Ischemic changes in setting of Right Prox. Tib/Fib FX 81 weeks old=Dr, Meyer managing /[facility] work up 2. ESRD -  MWF HD/current access pcath/due for new AVG next week. 3. Hypertension/volume  - 200/70 bp fu uf on hd ? wts with cast/ .8kg> edw now. Coreg 6.25mg  bid 4. Anemia  - check labs on hd , give venofer and Aranesp  5. Metabolic bone disease -  Katrina Meyer  And binders, labs pending 6. Nutrition - High protein renal diet/ carb. Mod 7. DM type 2= ssc, lantus  Katrina Pastel, PA-C [facility] Beeper [phone] 02/26/2012, 4:56 PM  I have seen and examined this patient  and agree with plan as outlined above.  Dialysis today usual MWF schedule.W/U of RLE per [facility] and ortho. Katrina Meyer B,MD 02/26/2012 8:36 PM

## 2012-02-26 NOTE — H&P (Signed)
Katrina Meyer is an 79 y.o. female.   Chief Complaint: Right leg pain HPI: 79 year old female with history of HTN, ESRD on chronic renal dialysis, stent right leg popliteal a 06/2010 Dr. Darrick Penna. Seen at [facility] 01/03/2012 with a right proximal tibia fracture and placed in a long leg Cast. Returned to office for follow up two weeks ago, 2 weeks late and had cast removed and leg evaluated Good alignment and a new long leg cast applied. One week ago the cast broke at the level of the knee And required reinforcement. Family has noticed right foot great toe changes since after the first change of Cast. Received a call from the family today indicating that the right foot is turning black.Seen in the office Today and the right foot shows dusky appearance of the medial 3 toes with an open blister that is weeping Over the right medial great toe distally. I spoke with Dr. Hart [place] Vascular surgeon and he has indicated That he will see her for eval. I am admitting this patient for acute ischemic changes of the right foot  Associated with a unhealed right proximal tibia fracture. If the leg circulation can be improved then I would Consider IM nail fixation of the right proximal tibia. If the right leg circulation can not be improved then a Right below knee amputation possible right above knee amputation depending on the patient's vascular studies.  Past Medical History  Diagnosis Date  . Hyperlipidemia   . Hypertension   . Diabetes mellitus   . DVT (deep venous thrombosis)   . End stage renal disease on dialysis   . Stroke   . Dementia   . Bone fracture 01/04/12    Fracture- rt tib fib    Past Surgical History  Procedure Date  . Dg av dialysis graft declot or   . Av fistula repair Aug. 2013    Left arm    Family History  Problem Relation Age of Onset  . Stroke Mother   . Diabetes Mother   . Cancer Mother   . Hypertension Mother   . Coronary artery disease Father   . Diabetes Sister     Social History:  reports that she has never smoked. She has never used smokeless tobacco. She reports that she does not drink alcohol or use illicit drugs.  Allergies: No Known Allergies  No prescriptions prior to admission    No results found for this or any previous visit (from the past 48 hour(s)). No results found.  Review of Systems  Constitutional: Negative.   HENT: Negative.  Negative for neck pain.   Eyes: Negative.   Respiratory: Negative.   Cardiovascular: Negative.   Gastrointestinal: Negative.   Genitourinary: Negative.   Musculoskeletal: Positive for falls. Negative for myalgias, back pain and joint pain.  Skin: Positive for rash. Negative for itching.  Neurological: Negative for dizziness, tingling, tremors, sensory change, speech change, focal weakness, seizures and loss of consciousness.  Endo/Heme/Allergies: Negative for environmental allergies and polydipsia. Does not bruise/bleed easily.  Psychiatric/Behavioral: Negative.     There were no vitals taken for this visit. Physical Exam  Constitutional: She is oriented to person, place, and time. She appears well-developed and well-nourished. No distress.  HENT:  Head: Normocephalic and atraumatic.  Right Ear: External ear normal.  Left Ear: External ear normal.  Nose: Nose normal.  Mouth/Throat: Oropharynx is clear and moist. No oropharyngeal exudate.  Eyes: Conjunctivae normal and EOM are normal. Pupils are equal, round, and  reactive to light. Right eye exhibits no discharge. Left eye exhibits no discharge. No scleral icterus.  Neck: Normal range of motion. Neck supple. No JVD present. No tracheal deviation present. No thyromegaly present.  Cardiovascular: Normal rate, regular rhythm, normal heart sounds and intact distal pulses.  Exam reveals no gallop and no friction rub.   No murmur heard. Respiratory: Breath sounds normal. No stridor. No respiratory distress. She has no wheezes. She has no rales. She  exhibits no tenderness.  GI: Bowel sounds are normal. She exhibits no distension and no mass. There is no tenderness. There is no rebound and no guarding.  Musculoskeletal: She exhibits no edema and no tenderness.  Lymphadenopathy:    She has no cervical adenopathy.  Neurological: She is alert and oriented to person, place, and time. She has normal reflexes. She displays normal reflexes. No cranial nerve deficit. Coordination normal.  Skin: Skin is dry. No rash noted. She is not diaphoretic. No erythema.  Psychiatric: She has a normal mood and affect. Her behavior is normal. Judgment and thought content normal.     Assessment/Plan Right ischemic medial forefoot. Right Proximal 1/3 Tibia-fibula fracture, 26 week old. End stage renal disease, dialysis dependant, was expected to undergo shunt procedure with Dr. Darrick Penna 03/04/2012  Plan:Admit to [facility] Obtain Renal Consult and Vascular Surgery consults. Based of the degree of severity of the PVD decide on salvage procedure revascularize and then ORIF with IM Nail versus Considering a right BKA throught the area of fracture versus AKA.  Katrina Meyer E 02/26/2012, 1:39 PM

## 2012-02-27 ENCOUNTER — Encounter (HOSPITAL_COMMUNITY): Payer: Self-pay | Admitting: General Practice

## 2012-02-27 DIAGNOSIS — I739 Peripheral vascular disease, unspecified: Secondary | ICD-10-CM

## 2012-02-27 DIAGNOSIS — L97509 Non-pressure chronic ulcer of other part of unspecified foot with unspecified severity: Secondary | ICD-10-CM | POA: Diagnosis present

## 2012-02-27 DIAGNOSIS — L89601 Pressure ulcer of unspecified heel, stage 1: Secondary | ICD-10-CM | POA: Diagnosis present

## 2012-02-27 HISTORY — DX: Peripheral vascular disease, unspecified: I73.9

## 2012-02-27 LAB — HEMOGLOBIN A1C
Hgb A1c MFr Bld: 6.9 % — ABNORMAL HIGH (ref <5.7)
Mean Plasma Glucose: 151 mg/dL — ABNORMAL HIGH (ref <117)

## 2012-02-27 LAB — GLUCOSE, CAPILLARY: Glucose-Capillary: 101 mg/dL — ABNORMAL HIGH (ref 70–99)

## 2012-02-27 MED ORDER — DOXERCALCIFEROL 4 MCG/2ML IV SOLN: 2.0000 ug | INTRAVENOUS | Status: DC

## 2012-02-27 MED ORDER — CEPHALEXIN 250 MG PO CAPS: 250.0000 mg | ORAL_CAPSULE | Freq: Two times a day (BID) | ORAL | Status: DC

## 2012-02-27 MED ORDER — HYDROCODONE-ACETAMINOPHEN 5-325 MG PO TABS: 1.0000 | ORAL_TABLET | Freq: Four times a day (QID) | ORAL | Status: DC | PRN

## 2012-02-27 NOTE — Progress Notes (Signed)
Patient ID: Katrina Meyer, female   DOB: 17-Sep-1932, 79 y.o.   MRN: [id number] Pt seen this am briefly on rounds and found to be comfortable.  Will return later today to remove old cast and thoroughly check skin and replace cast with DR Otelia Sergeant. Explained to pt and family present that pt may be discharged tomorrow.

## 2012-02-27 NOTE — Progress Notes (Signed)
Subjective:     Patient reports pain as mild.   Not speaking very much but comfortable as new cast applied. Family present  Objective: Vital signs in last 24 hours: Temp:  [97 F (36.1 C)-98.6 F (37 C)] 98.6 F (37 C) (01/23 0558) Pulse Rate:  [62-91] 75  (01/23 0558) Resp:  [15-18] 18  (01/23 0558) BP: (97-241)/(60-96) 155/88 mmHg (01/23 0558) SpO2:  [97 %-100 %] 98 % (01/23 0558) Weight:  [73.5 kg (162 lb 0.6 oz)-78.3 kg (172 lb 9.9 oz)] 73.5 kg (162 lb 0.6 oz) (01/22 2034)  Intake/Output from previous day: 01/22 0701 - 01/23 0700 In: -  Out: 2290  Intake/Output this shift:     Basename 02/26/12 2115  HGB 10.4*    Basename 02/26/12 2115  WBC 8.1  RBC 3.32*  HCT 32.9*  PLT 241    Basename 02/26/12 1651  NA 142  K 4.1  CL 99  CO2 27  BUN 38*  CREATININE 6.19*  GLUCOSE 322*  CALCIUM 10.0    Basename 02/26/12 2115  LABPT --  INR 1.10    skin of great toe with sloughing of skin at tip.  cellulitis improved.  less edema and erythema.  skin under cast intact without breakdown.  pressure ulcer of heel with eschar and no cellulitis.   new well padded long leg cast applied   Assessment/Plan:     Discharge home with home health as previously supplied. Family present and will care for pt at home. rx vicodin Cast care discussed and wound care.  Will treat gt toe with abx ointment and dressing daily. OV 2 weeks  Bronislaw Switzer M 02/27/2012, 3:01 PM

## 2012-02-27 NOTE — Progress Notes (Signed)
Subjective:  Patient very quiet and a little sad but answers questions Tolerated dialysis yesterday with vol off of 2.3 liters Permcath worked OK  Objective:     Vital signs in last 24 hours: Filed Vitals:   02/26/12 2015 02/26/12 2034 02/27/12 0552 02/27/12 0558  BP: 97/60 165/87  155/88  Pulse: 65 74  75  Temp:  97 F (36.1 C)  98.6 F (37 C)  TempSrc:  Oral    Resp: 18 16  18   Weight:  73.5 kg (162 lb 0.6 oz)    SpO2:  97% 99% 98%   Weight change:   Intake/Output Summary (Last 24 hours) at 02/27/12 1014 Last data filed at 02/26/12 2034  Gross per 24 hour  Intake      0 ml  Output   2290 ml  Net  -2290 ml    Physical Exam:  Blood pressure 155/88, pulse 75, temperature 98.6 F (37 C), temperature source Oral, resp. rate 18, weight 73.5 kg (162 lb 0.6 oz), SpO2 98.00%. Lungs clear 2/6 murmur USB Permcath non tender Abdomen soft with BS RLE in cast; toes look pinker blister/denuded great toe pink No edema of LLE  Labs:   Lab 02/26/12 1651  NA 142  K 4.1  CL 99  CO2 27  GLUCOSE 322*  BUN 38*  CREATININE 6.19*  ALB --  CALCIUM 10.0  PHOS 5.1*     Lab 02/26/12 1651  AST --  ALT --  ALKPHOS --  BILITOT --  PROT --  ALBUMIN 2.6*   Lab 02/26/12 2115  WBC 8.1  NEUTROABS --  HGB 10.4*  HCT 32.9*  MCV 99.1  PLT 241   Lab 02/27/12 0630 02/26/12 2229  GLUCAP 300* 238*     No results found for this basename: IRON:30,TIBC:30,TRANSFERRIN:30,FERRITIN:30 in the last 168 hours  Studies/Results: Dg Chest Portable 1 View  02/27/2012  *RADIOLOGY REPORT*  Clinical Data: Preoperative chest radiograph.  PORTABLE CHEST - 1 VIEW  Comparison: Chest radiograph performed 01/04/2012  Findings: Mild peripheral right midlung opacity could reflect mild pneumonia, or may reflect overlying soft tissues.  Would correlate for associated symptoms.  The left lung appears grossly clear.  No definite pleural effusion or pneumothorax is seen.  The cardiomediastinal silhouette  is enlarged; diffuse calcification is noted along the descending thoracic aorta.  A right-sided IJ dual lumen catheter is seen ending about the cavoatrial junction. No acute osseous abnormalities are seen.  IMPRESSION:  1.  Mild peripheral right midlung opacity could reflect mild pneumonia, or may reflect overlying soft tissues.  Would correlate for associated symptoms. 2.  Cardiomegaly noted.   Original Report Authenticated By: Tonia Ghent, M.D.          . aspirin EC  325 mg Oral BID PC  . atorvastatin  10 mg Oral QPC supper  . carbidopa-levodopa  1.5 tablet Oral TID  . carvedilol  6.25 mg Oral BID WC  . cephALEXin  250 mg Oral Q12H  . darbepoetin (ARANESP) injection - DIALYSIS  100 mcg Intravenous Q Wed-HD  . donepezil  10 mg Oral QHS  . ferric gluconate (FERRLECIT/NULECIT) IV  62.5 mg Intravenous Q Wed-HD  . insulin aspart  0-15 Units Subcutaneous TID WC  . insulin glargine  4 Units Subcutaneous QHS  . lanthanum  500 mg Oral TID WC  . levothyroxine  25 mcg Oral QAC breakfast  . loratadine  10 mg Oral Daily  . multivitamin  1 tablet Oral Daily  . sevelamer  carbonate  800-1,600 mg Oral TID WC    I  have reviewed scheduled and prn medications.  Dialysis Orders: Center: [facility] on MWF .  EDW 77.5 kg HD Bath 2.0 k, ,Ca 2.25 Time 3.5 hr Heparin 2600 units. Access right ij perm cath / clotted r bvt bap. Med center placed in 2013 BFR 400 DFR 800  Hectoral 2 mcg IV/HD Epogen 16000 Units IV/HD Venofer 100mg  weekly  Other 0     ASSESSMENT/RECOMMENDATIONS  Assessment/Plan  1. Right Foot Ischemic changes in setting of Right Prox. Tib/Fib FX 22 weeks old/toes look better/duplex patent stent no in stent stenosis - no further vascular intervention needed  Possible D/C tomorrow after cast change later today 2. ESRD - MWF HD/current access pcath/due for new AVG next week. HD first round tomorrow 3. Hypertension/volume -  ? wts with cast.  Coreg 6.25mg  bid 4. Anemia - venofer (fERRLECIT HERE) and  Aranesp 100 FOR epo (RECEIVED ON 1/22)  5. Metabolic bone disease - hectorol  2 (last PTH 400's but corrected calcium high; just changed at outpt unit yesterday to phoslyra - will defer further changes to OP HD - may well become overtly hypercalcemic with the ca based binder); use 2Ca bath 6. Nutrition - High protein renal diet/ carb. Mod 7. DM type 2= ssc, lantus   Camille Bal, MD [facility] [phone] Pager 02/27/2012, 10:14 AM

## 2012-02-27 NOTE — Progress Notes (Signed)
Orthopedic Tech Progress Note Patient Details:  Katrina Meyer 13-Oct-1932 [id number]  Casting Type of Cast: Long leg cast Cast Location: right leg Cast Material: Fiberglass Cast Intervention: Application  Viewed order from doctor's order list   Nikki Dom 02/27/2012, 3:02 PM

## 2012-02-27 NOTE — Progress Notes (Signed)
Utilization review completed. Nataliah Hatlestad, RN, BSN. 

## 2012-02-27 NOTE — Progress Notes (Signed)
Patient examined and lab reviewed with Vernon PA-C. 

## 2012-02-27 NOTE — Progress Notes (Addendum)
Patient ID: Katrina Meyer, female   DOB: 1932-08-25, 79 y.o.   MRN: [id number] Reviewed duplex scan of right leg last evening Stent in right popliteal artery is patent with no evidence of in-stent stenosis. Circulation is intact to the foot level with 2 vessel runoff the AT and peroneal  No further vascular workup indicated no need for angiography Okay for DC home from vascular standpoint and we'll see on when necessary basis regarding right lower extremity She is scheduled for me to insert right arm AV graft and ligate left upper arm AV fistula next Thursday

## 2012-02-28 ENCOUNTER — Other Ambulatory Visit: Payer: Self-pay | Admitting: *Deleted

## 2012-02-28 NOTE — Progress Notes (Signed)
CARE MANAGEMENT NOTE 02/28/2012  Patient:  Katrina Meyer, Katrina Meyer   Account Number:  [id number]  Date Initiated:  02/28/2012  Documentation initiated by:  Vance Peper  Subjective/Objective Assessment:   79 yr old female admitted for right ischemic leg. Cast replaced.     Action/Plan:   CM spoke with patient and family member concerning home health and DME needs. Patient has used [facility] DME.   Anticipated DC Date:  02/27/2012   Anticipated DC Plan:  HOME W HOME HEALTH SERVICES      DC Planning Services  CM consult      [facility] Choice  HOME HEALTH   Choice offered to / List presented to:  C-1 Patient        HH arranged  HH-1 RN      [facility] agency  [facility]   Status of service:  Completed, signed off Medicare Important Message given?   (If response is "NO", the following Medicare IM given date fields will be blank) Date Medicare IM given:   Date Additional Medicare IM given:    Discharge Disposition:  HOME W HOME HEALTH SERVICES  Per UR Regulation:    If discussed at Long Length of Stay Meetings, dates discussed:    Comments:

## 2012-03-02 NOTE — Progress Notes (Signed)
Patient examined and lab reviewed with Vernon PA-C. 

## 2012-03-04 MED ORDER — DEXTROSE 5 % IV SOLN
1.5000 g | INTRAVENOUS | Status: AC
  Administered 2012-03-24: 1.5 g via INTRAVENOUS
  Filled 2012-03-04 (×2): qty 1.5

## 2012-03-09 ENCOUNTER — Inpatient Hospital Stay (HOSPITAL_COMMUNITY): Payer: Medicare Other

## 2012-03-09 ENCOUNTER — Inpatient Hospital Stay (HOSPITAL_COMMUNITY)
Admission: EM | Admit: 2012-03-09 | Discharge: 2012-03-12 | DRG: 377 | Disposition: A | Discharge status: Home Health | Payer: Medicare Other | Attending: Internal Medicine | Admitting: Internal Medicine

## 2012-03-09 ENCOUNTER — Other Ambulatory Visit: Payer: Self-pay

## 2012-03-09 ENCOUNTER — Emergency Department (HOSPITAL_COMMUNITY): Payer: Medicare Other

## 2012-03-09 ENCOUNTER — Encounter (HOSPITAL_COMMUNITY): Payer: Self-pay | Admitting: Emergency Medicine

## 2012-03-09 DIAGNOSIS — L89601 Pressure ulcer of unspecified heel, stage 1: Secondary | ICD-10-CM | POA: Diagnosis present

## 2012-03-09 DIAGNOSIS — D649 Anemia, unspecified: Secondary | ICD-10-CM

## 2012-03-09 DIAGNOSIS — G20 Parkinson's disease: Secondary | ICD-10-CM | POA: Diagnosis present

## 2012-03-09 DIAGNOSIS — G9340 Encephalopathy, unspecified: Secondary | ICD-10-CM | POA: Diagnosis present

## 2012-03-09 DIAGNOSIS — I4891 Unspecified atrial fibrillation: Secondary | ICD-10-CM | POA: Diagnosis present

## 2012-03-09 DIAGNOSIS — D539 Nutritional anemia, unspecified: Secondary | ICD-10-CM

## 2012-03-09 DIAGNOSIS — N186 End stage renal disease: Secondary | ICD-10-CM | POA: Diagnosis present

## 2012-03-09 DIAGNOSIS — N2581 Secondary hyperparathyroidism of renal origin: Secondary | ICD-10-CM | POA: Diagnosis present

## 2012-03-09 DIAGNOSIS — L97509 Non-pressure chronic ulcer of other part of unspecified foot with unspecified severity: Secondary | ICD-10-CM | POA: Diagnosis present

## 2012-03-09 DIAGNOSIS — N19 Unspecified kidney failure: Secondary | ICD-10-CM

## 2012-03-09 DIAGNOSIS — K922 Gastrointestinal hemorrhage, unspecified: Secondary | ICD-10-CM

## 2012-03-09 DIAGNOSIS — I5022 Chronic systolic (congestive) heart failure: Secondary | ICD-10-CM | POA: Diagnosis present

## 2012-03-09 DIAGNOSIS — I509 Heart failure, unspecified: Secondary | ICD-10-CM | POA: Diagnosis present

## 2012-03-09 DIAGNOSIS — D62 Acute posthemorrhagic anemia: Secondary | ICD-10-CM | POA: Diagnosis present

## 2012-03-09 DIAGNOSIS — K5731 Diverticulosis of large intestine without perforation or abscess with bleeding: Principal | ICD-10-CM | POA: Diagnosis present

## 2012-03-09 DIAGNOSIS — L89609 Pressure ulcer of unspecified heel, unspecified stage: Secondary | ICD-10-CM | POA: Diagnosis present

## 2012-03-09 DIAGNOSIS — Z4789 Encounter for other orthopedic aftercare: Secondary | ICD-10-CM

## 2012-03-09 DIAGNOSIS — Z794 Long term (current) use of insulin: Secondary | ICD-10-CM

## 2012-03-09 DIAGNOSIS — Z7982 Long term (current) use of aspirin: Secondary | ICD-10-CM

## 2012-03-09 DIAGNOSIS — E119 Type 2 diabetes mellitus without complications: Secondary | ICD-10-CM | POA: Diagnosis present

## 2012-03-09 DIAGNOSIS — E669 Obesity, unspecified: Secondary | ICD-10-CM | POA: Diagnosis present

## 2012-03-09 DIAGNOSIS — Z86718 Personal history of other venous thrombosis and embolism: Secondary | ICD-10-CM

## 2012-03-09 DIAGNOSIS — Z6828 Body mass index (BMI) 28.0-28.9, adult: Secondary | ICD-10-CM

## 2012-03-09 DIAGNOSIS — D126 Benign neoplasm of colon, unspecified: Secondary | ICD-10-CM | POA: Diagnosis present

## 2012-03-09 DIAGNOSIS — I739 Peripheral vascular disease, unspecified: Secondary | ICD-10-CM | POA: Diagnosis present

## 2012-03-09 DIAGNOSIS — K921 Melena: Secondary | ICD-10-CM

## 2012-03-09 DIAGNOSIS — Z79899 Other long term (current) drug therapy: Secondary | ICD-10-CM

## 2012-03-09 DIAGNOSIS — D631 Anemia in chronic kidney disease: Secondary | ICD-10-CM | POA: Diagnosis present

## 2012-03-09 DIAGNOSIS — Z8673 Personal history of transient ischemic attack (TIA), and cerebral infarction without residual deficits: Secondary | ICD-10-CM

## 2012-03-09 DIAGNOSIS — F0280 Dementia in other diseases classified elsewhere without behavioral disturbance: Secondary | ICD-10-CM | POA: Diagnosis present

## 2012-03-09 DIAGNOSIS — G3183 Dementia with Lewy bodies: Secondary | ICD-10-CM | POA: Diagnosis present

## 2012-03-09 DIAGNOSIS — E039 Hypothyroidism, unspecified: Secondary | ICD-10-CM | POA: Diagnosis present

## 2012-03-09 DIAGNOSIS — I120 Hypertensive chronic kidney disease with stage 5 chronic kidney disease or end stage renal disease: Secondary | ICD-10-CM | POA: Diagnosis present

## 2012-03-09 DIAGNOSIS — L8991 Pressure ulcer of unspecified site, stage 1: Secondary | ICD-10-CM | POA: Diagnosis present

## 2012-03-09 HISTORY — DX: Nutritional anemia, unspecified: D53.9

## 2012-03-09 HISTORY — DX: Unspecified fracture of shaft of unspecified tibia, initial encounter for closed fracture: S82.209A

## 2012-03-09 HISTORY — DX: Unspecified atrial fibrillation: I48.91

## 2012-03-09 HISTORY — DX: Unspecified fracture of shaft of unspecified fibula, initial encounter for closed fracture: S82.409A

## 2012-03-09 HISTORY — DX: Parkinson's disease: G20

## 2012-03-09 HISTORY — DX: Parkinson's disease without dyskinesia, without mention of fluctuations: G20.A1

## 2012-03-09 LAB — RETICULOCYTES
RBC.: 3.47 MIL/uL — ABNORMAL LOW (ref 3.87–5.11)
Retic Ct Pct: 4.5 % — ABNORMAL HIGH (ref 0.4–3.1)

## 2012-03-09 LAB — LACTIC ACID, PLASMA: Lactic Acid, Venous: 1.8 mmol/L (ref 0.5–2.2)

## 2012-03-09 LAB — BASIC METABOLIC PANEL
CO2: 34 mEq/L — ABNORMAL HIGH (ref 19–32)
Calcium: 9.4 mg/dL (ref 8.4–10.5)
Creatinine, Ser: 3.32 mg/dL — ABNORMAL HIGH (ref 0.50–1.10)

## 2012-03-09 LAB — CBC WITH DIFFERENTIAL/PLATELET
Basophils Absolute: 0 10*3/uL (ref 0.0–0.1)
Basophils Relative: 0 % (ref 0–1)
Eosinophils Absolute: 0.1 10*3/uL (ref 0.0–0.7)
Eosinophils Relative: 2 % (ref 0–5)
Lymphocytes Relative: 17 % (ref 12–46)
MCV: 103.2 fL — ABNORMAL HIGH (ref 78.0–100.0)
Platelets: 262 10*3/uL (ref 150–400)
RDW: 19 % — ABNORMAL HIGH (ref 11.5–15.5)
WBC: 6.1 10*3/uL (ref 4.0–10.5)

## 2012-03-09 LAB — TYPE AND SCREEN

## 2012-03-09 LAB — OCCULT BLOOD, POC DEVICE: Fecal Occult Bld: POSITIVE — AB

## 2012-03-09 MED ORDER — ONDANSETRON HCL 4 MG/2ML IJ SOLN: 4.0000 mg | Freq: Four times a day (QID) | INTRAMUSCULAR | Status: DC | PRN

## 2012-03-09 MED ORDER — METOPROLOL TARTRATE 1 MG/ML IV SOLN: 5.0000 mg | Freq: Four times a day (QID) | INTRAVENOUS | Status: DC

## 2012-03-09 MED ORDER — SODIUM CHLORIDE 0.9 % IV SOLN: 250.0000 mL | INTRAVENOUS | Status: DC | PRN

## 2012-03-09 MED ORDER — PANTOPRAZOLE SODIUM 40 MG IV SOLR
40.0000 mg | Freq: Two times a day (BID) | INTRAVENOUS | Status: DC
  Administered 2012-03-09 – 2012-03-10 (×3): 40 mg via INTRAVENOUS
  Filled 2012-03-09 (×3): qty 40

## 2012-03-09 MED ORDER — METOPROLOL TARTRATE 1 MG/ML IV SOLN
5.0000 mg | Freq: Four times a day (QID) | INTRAVENOUS | Status: DC
  Administered 2012-03-09 – 2012-03-10 (×2): 5 mg via INTRAVENOUS
  Filled 2012-03-09 (×2): qty 5

## 2012-03-09 MED ORDER — SODIUM CHLORIDE 0.9 % IJ SOLN: 3.0000 mL | INTRAMUSCULAR | Status: DC | PRN

## 2012-03-09 MED ORDER — SODIUM CHLORIDE 0.9 % IJ SOLN
3.0000 mL | Freq: Two times a day (BID) | INTRAMUSCULAR | Status: DC
  Administered 2012-03-10: 3 mL via INTRAVENOUS

## 2012-03-09 MED ORDER — ONDANSETRON HCL 4 MG PO TABS: 4.0000 mg | ORAL_TABLET | Freq: Four times a day (QID) | ORAL | Status: DC | PRN

## 2012-03-09 MED ORDER — ACETAMINOPHEN 650 MG RE SUPP: 650.0000 mg | Freq: Four times a day (QID) | RECTAL | Status: DC | PRN

## 2012-03-09 MED ORDER — ALBUTEROL SULFATE (5 MG/ML) 0.5% IN NEBU: 2.5000 mg | INHALATION_SOLUTION | RESPIRATORY_TRACT | Status: DC | PRN

## 2012-03-09 MED ORDER — INSULIN ASPART 100 UNIT/ML ~~LOC~~ SOLN
0.0000 [IU] | SUBCUTANEOUS | Status: DC
  Administered 2012-03-09: 2 [IU] via SUBCUTANEOUS
  Administered 2012-03-10: 3 [IU] via SUBCUTANEOUS

## 2012-03-09 MED ORDER — ACETAMINOPHEN 325 MG PO TABS
650.0000 mg | ORAL_TABLET | Freq: Four times a day (QID) | ORAL | Status: DC | PRN
  Administered 2012-03-09 – 2012-03-11 (×2): 650 mg via ORAL
  Filled 2012-03-09 (×2): qty 2

## 2012-03-09 MED ORDER — PANTOPRAZOLE SODIUM 40 MG IV SOLR
40.0000 mg | Freq: Once | INTRAVENOUS | Status: AC
  Administered 2012-03-09: 40 mg via INTRAVENOUS
  Filled 2012-03-09: qty 40

## 2012-03-09 NOTE — ED Provider Notes (Signed)
History     CSN: [id number]  Arrival date & time 03/09/12  1559   First MD Initiated Contact with Patient 03/09/12 1618      Chief Complaint  Patient presents with  . Hypotension     Patient is a 79 y.o. female presenting with weakness. The history is provided by the patient and a caregiver. The history is limited by the condition of the patient.  Weakness Primary symptoms do not include loss of consciousness. Episode onset: just prior to arrival. The symptoms are improving. The neurological symptoms are diffuse.  Additional symptoms include weakness. Associated symptoms comments: Rectal bleeding .   nothing worsens her symptoms Rest improves her symptoms  Pt presents from dialysis for generalized weakness and hypotension Apparently she did not respond well after dialysis and EMS was called and she was noted to be hypoxic and hypotensive.  Pt is confused and is unable to give full details  Past Medical History  Diagnosis Date  . Hyperlipidemia   . Hypertension   . Diabetes mellitus   . DVT (deep venous thrombosis)   . End stage renal disease on dialysis   . Stroke   . Dementia   . Bone fracture 01/04/12    Fracture- rt tib fib    Past Surgical History  Procedure Date  . Dg av dialysis graft declot or   . Av fistula repair Aug. 2013    Left arm  . Cholecystectomy     Family History  Problem Relation Age of Onset  . Stroke Mother   . Diabetes Mother   . Cancer Mother   . Hypertension Mother   . Coronary artery disease Father   . Diabetes Sister     History  Substance Use Topics  . Smoking status: Never Smoker   . Smokeless tobacco: Never Used  . Alcohol Use: No    OB History    Grav Para Term Preterm Abortions TAB SAB Ect Mult Living                  Review of Systems  Unable to perform ROS: Mental status change  Neurological: Positive for weakness. Negative for loss of consciousness.    Allergies  Review of patient's allergies indicates no  known allergies.  Home Medications   Current Outpatient Rx  Name  Route  Sig  Dispense  Refill  . ASPIRIN EC 325 MG PO TBEC   Oral   Take 325 mg by mouth daily.         . ATORVASTATIN CALCIUM 10 MG PO TABS   Oral   Take 1 tablet by mouth Daily.         Marland Kitchen CARBIDOPA-LEVODOPA 25-100 MG PO TABS   Oral   Take 1.5 tablets by mouth 3 (three) times daily.          Marland Kitchen CARVEDILOL 6.25 MG PO TABS   Oral   Take 1 tablet (6.25 mg total) by mouth 2 (two) times daily with a meal.   60 tablet   0   . CEPHALEXIN 250 MG PO CAPS   Oral   Take 1 capsule (250 mg total) by mouth every 12 (twelve) hours.   14 capsule   0   . DONEPEZIL HCL 10 MG PO TABS   Oral   Take 1 tablet by mouth Daily.         Marland Kitchen FEXOFENADINE HCL 180 MG PO TABS   Oral   Take 180 mg by mouth daily.         Marland Kitchen  FLUOXETINE HCL 40 MG PO CAPS   Oral   Take 1 capsule by mouth daily.          Marland Kitchen HYDROCODONE-ACETAMINOPHEN 5-325 MG PO TABS   Oral   Take 1-2 tablets by mouth every 6 (six) hours as needed.   50 tablet   1   . HYDROCODONE-ACETAMINOPHEN 5-325 MG PO TABS   Oral   Take 1 tablet by mouth every 6 (six) hours as needed for pain.   30 tablet   0   . INSULIN GLARGINE 100 UNIT/ML [place] SOLN   Subcutaneous   Inject 4 Units into the skin at bedtime.          . INSULIN LISPRO (HUMAN) 100 UNIT/ML  SOLN   Subcutaneous   Inject into the skin 3 (three) times daily before meals. As directed per sliding scale         . LEVOTHYROXINE SODIUM 25 MCG PO TABS   Oral   Take 25 mcg by mouth every morning.         Marland Kitchen LOPERAMIDE HCL 2 MG PO CAPS   Oral   Take 2 mg by mouth daily as needed. For anti-diarrheal         . RENA-VITE PO TABS   Oral   Take 1 tablet by mouth daily.         Marland Kitchen SEVELAMER CARBONATE 2.4 G PO PACK   Oral   Take 2.4 g by mouth See admin instructions. Take before each meal and before snacks         . TRAMADOL HCL 50 MG PO TABS   Oral   Take 1 tablet (50 mg total) by mouth  every 6 (six) hours as needed for pain.   50 tablet   1     BP 148/81  Pulse 118  Temp 97.8 F (36.6 C) (Oral)  Resp 16  Ht 5\' 7"  (1.702 m)  Wt 170 lb (77.111 kg)  BMI 26.63 kg/m2  SpO2 100%  Physical Exam CONSTITUTIONAL: Well developed/well nourished HEAD AND FACE: Normocephalic/atraumatic EYES: EOMI/PERRL ENMT: Mucous membranes dry NECK: supple no meningeal signs SPINE:entire spine nontender CV: tachycardic LUNGS: Lungs are clear to auscultation bilaterally, no apparent distress ABDOMEN: soft, nontender, no rebound or guarding Rectal - melena noted.  +hemoccult, chaperone present GU:no cva tenderness NEURO: Pt is awake/alert, moves all extremitiesx4 but appears confused and does not respond to all of my questions EXTREMITIES: right LE in long leg cast SKIN: warm, color normal PSYCH: confusion noted  ED Course  Procedures   Labs Reviewed  OCCULT BLOOD, POC DEVICE - Abnormal; Notable for the following:    Fecal Occult Bld POSITIVE (*)     All other components within normal limits  BASIC METABOLIC PANEL  CBC WITH DIFFERENTIAL  LACTIC ACID, PLASMA  TYPE AND SCREEN  PROTIME-INR  APTT  4:56 PM Pt from dialysis for hypotension (this resolved) and noted to have obvious GI bleed Review of chart does not reveal any recent GI evaluation Will follow closely Caregiver at bedside reports that she is confused at baseline most days 5:50 PM Daughter arrived and reported that pt does have intermittent confusion.  She was told that she had blood in stool this morning by caregiver but had otherwise been at her baseline.  She has no known recent issues with GI bleeding.  She has no personal/religious objections to blood.   Pt is now more awake/alert interactive.  Her abdominal exam is benign She is also  noted to be in afib with some RVR  6:22 PM Pt stable in the ED D/w dr Sherrie Mustache, will admit. Given she has improved, not hypotensive she recommends tele  MDM  Nursing notes  including past medical history and social history reviewed and considered in documentation Labs/vital reviewed and considered xrays reviewed and considered        Date: 03/09/2012  Rate: 110  Rhythm: atrial fibrillation  QRS Axis: normal  Intervals: normal  ST/T Wave abnormalities: nonspecific ST changes  Conduction Disutrbances:none  Narrative Interpretation:   Old EKG Reviewed: unchanged    Joya Gaskins, MD 03/09/12 1825

## 2012-03-09 NOTE — Discharge Summary (Signed)
Physician Discharge Summary  Patient ID: Katrina Meyer MRN: [id number] DOB/AGE: 1932/12/23 79 y.o.  Admit date: 02/26/2012 Discharge date: 03/09/2012  Admission Diagnoses:  Skin ulcer of great toe right Right proximal 1/3 tibia and fibula fracture., 7 week post injury Peripheral vascular disease  Discharge Diagnoses:  Principal Problem:  *Skin ulcer of great toe Active Problems:  Decubitus ulcer of heel, stage 1 anemia of chronic disease  Past Medical History  Diagnosis Date  . Hyperlipidemia   . Hypertension   . Diabetes mellitus   . DVT (deep venous thrombosis)   . End stage renal disease on dialysis   . Stroke   . Dementia   . Bone fracture 01/04/12    Fracture- rt tib fib    Surgeries:  none   Consultants (if any): Treatment Team:  Sadie Haber, MD Nephology [place]. Lawson,MD, vascular surgery  Discharged Condition: Improved  Hospital Course: Katrina Meyer is an 79 y.o. female who was admitted 02/26/2012 with a diagnosis of Skin ulcer of great toe right. Pt with history of  stent right leg popliteal artery 06/2010 Dr. Darrick Penna. Seen at [facility] 01/03/2012 with a right proximal tibia fracture and placed in a long leg cast.  Returned to office for follow up two weeks ago, 2 weeks late and had cast removed and leg evaluated . Good alignment and a new long leg cast applied. One week ago the cast broke at the level of the knee and required reinforcement. Family has noticed right foot great toe changes since after the first change of cast Admitted to the hospital for evaluation of vascular status of right leg to decide upon further treatment options. Duplex scan performed which revealed widely patent stent in right popliteal artery. Patient has 2 vessel runoff through disease anterior tibial and peroneal artery-not amenable to further intervention . Flow and anterior tibial artery at foot level is brisk. Pt treated for pressure sore of toe with antibiotic ointment and dry  dressing with improvement during the hospital stay. Oral Keflex used for local cellulitis with assist by pharmacy for dosing. She received her usual hemodialysis during the stay without complications, followed by DR Eliott Nine. New well padded long leg cast applied to the right leg prior to discharge.   She was given oral antibiotics: Anti-infectives     Start     Dose/Rate Route Frequency Ordered Stop   02/27/12 0000   cephALEXin (KEFLEX) 250 MG capsule        250 mg Oral Every 12 hours 02/27/12 1512     02/26/12 2200   cephALEXin (KEFLEX) capsule 250 mg  Status:  Discontinued        250 mg Oral Every 12 hours 02/26/12 2135 02/27/12 2153        .  She was discharged to her home for continued care by her family.  Transfer via ambulance was required as family unable to transfer pt to home.  She benefited maximally from the hospital stay and there were no complications.    Recent vital signs:  Filed Vitals:   02/27/12 1200  BP:   Pulse:   Temp:   Resp: 16    Recent laboratory studies:  Lab Results  Component Value Date   HGB 10.4* 02/26/2012   HGB 8.8* 02/02/2012   HGB 8.3* 01/07/2012   Lab Results  Component Value Date   WBC 8.1 02/26/2012   PLT 241 02/26/2012   Lab Results  Component Value Date   INR 1.10  02/26/2012   Lab Results  Component Value Date   NA 142 02/26/2012   K 4.1 02/26/2012   CL 99 02/26/2012   CO2 27 02/26/2012   BUN 38* 02/26/2012   CREATININE 6.19* 02/26/2012   GLUCOSE 322* 02/26/2012    Discharge Medications:     Medication List     As of 03/09/2012 12:12 PM    TAKE these medications         aspirin EC 325 MG tablet   Take 325 mg by mouth daily.      atorvastatin 10 MG tablet   Commonly known as: LIPITOR   Take 1 tablet by mouth Daily.      carbidopa-levodopa 25-100 MG per tablet   Commonly known as: SINEMET IR   Take 1.5 tablets by mouth 3 (three) times daily.      carvedilol 6.25 MG tablet   Commonly known as: COREG   Take 1 tablet  (6.25 mg total) by mouth 2 (two) times daily with a meal.      cephALEXin 250 MG capsule   Commonly known as: KEFLEX   Take 1 capsule (250 mg total) by mouth every 12 (twelve) hours.      donepezil 10 MG tablet   Commonly known as: ARICEPT   Take 1 tablet by mouth Daily.      fexofenadine 180 MG tablet   Commonly known as: ALLEGRA   Take 180 mg by mouth daily.      FLUoxetine 40 MG capsule   Commonly known as: PROZAC   Take 1 capsule by mouth daily.      HYDROcodone-acetaminophen 5-325 MG per tablet   Commonly known as: NORCO/VICODIN   Take 1-2 tablets by mouth every 6 (six) hours as needed.      HYDROcodone-acetaminophen 5-325 MG per tablet   Commonly known as: NORCO/VICODIN   Take 1 tablet by mouth every 6 (six) hours as needed for pain.      insulin glargine 100 UNIT/ML injection   Commonly known as: LANTUS   Inject 4 Units into the skin at bedtime.      insulin lispro 100 UNIT/ML injection   Commonly known as: HUMALOG   Inject into the skin 3 (three) times daily before meals. As directed per sliding scale      levothyroxine 25 MCG tablet   Commonly known as: SYNTHROID, LEVOTHROID   Take 25 mcg by mouth every morning.      loperamide 2 MG capsule   Commonly known as: IMODIUM   Take 2 mg by mouth daily as needed. For anti-diarrheal      multivitamin Tabs tablet   Take 1 tablet by mouth daily.      RENVELA 2.4 G Pack   Generic drug: sevelamer carbonate   Take 2.4 g by mouth See admin instructions. Take before each meal and before snacks      traMADol 50 MG tablet   Commonly known as: ULTRAM   Take 1 tablet (50 mg total) by mouth every 6 (six) hours as needed for pain.        Diagnostic Studies: Dg Chest Portable 1 View  02/27/2012  *RADIOLOGY REPORT*  Clinical Data: Preoperative chest radiograph.  PORTABLE CHEST - 1 VIEW  Comparison: Chest radiograph performed 01/04/2012  Findings: Mild peripheral right midlung opacity could reflect mild pneumonia, or may  reflect overlying soft tissues.  Would correlate for associated symptoms.  The left lung appears grossly clear.  No definite pleural effusion or pneumothorax  is seen.  The cardiomediastinal silhouette is enlarged; diffuse calcification is noted along the descending thoracic aorta.  A right-sided IJ dual lumen catheter is seen ending about the cavoatrial junction. No acute osseous abnormalities are seen.  IMPRESSION:  1.  Mild peripheral right midlung opacity could reflect mild pneumonia, or may reflect overlying soft tissues.  Would correlate for associated symptoms. 2.  Cardiomegaly noted.   Original Report Authenticated By: Tonia Ghent, M.D.     Disposition: 01-Home or Self Care      Discharge Orders    Future Appointments: Provider: Department: Dept Phone: Center:   04/28/2012 2:30 PM [facility] [phone] [facility]   04/28/2012 3:00 PM [facility] [phone] [facility]   04/28/2012 3:40 PM Evern Bio, NP [facility] [phone] [facility]     Future Orders Please Complete By Expires   Call MD / Call 911      Comments:   If you experience chest pain or shortness of breath, CALL 911 and be transported to the hospital emergency room.  If you develope a fever above 101 F, pus (white drainage) or increased drainage or redness at the wound, or calf pain, call your surgeon's office.   Constipation Prevention      Comments:   Drink plenty of fluids.  Prune juice may be helpful.  You may use a stool softener, such as Colace (over the counter) 100 mg twice a day.  Use MiraLax (over the counter) for constipation as needed.   Increase activity slowly as tolerated      Discharge instructions      Comments:   Keep cast dry and clean at all times.  Change position every 1-2 hrs to prevent decubitus.  Pillows under calf to float foot and heel.  Change dressing to toe daily and apply triple  antibiotic ointment.  Keep foot elevated when at rest.      Follow-up Information    Follow up with NITKA,JAMES E, MD. Schedule an appointment as soon as possible for a visit in 2 weeks.   Contact information:   [address] [place] [address] [phone]           Signed: Wende Neighbors 03/09/2012, 12:12 PM

## 2012-03-09 NOTE — H&P (Addendum)
[facility] History and Physical  Katrina Meyer AVW:[id number] DOB: Jul 21, 1932 DOA: 03/09/2012   PCP: Ignatius Specking., MD  Specialists: Followed by by Dr. Lowell Guitar, who is her nephrologist  Chief Complaint: Bleeding per rectum and confusion  HPI: Katrina Meyer is a 79 y.o. female with a past medical history of end-stage renal disease on dialysis on Monday, Wednesday, Friday, Parkinson's disease, history of systolic heart failure, though no EF is available, diabetes, hypertension, hypothyroidism. She was recently discharged from [facility] last week after evaluation for ulcer on her right first toe. There was some concern about vascular insufficiency. Patient is accompanied by her daughter and caregiver. Unfortunately, both of them are very poor historians. Patient is also unable to provide much history. So, most of the data is available from previous notes. According to nursing notes the patient was at dialysis when she was found to be unresponsive. She was found to be hypotensive with a systolic of 84. Patient was placed in Trendelenburg position. Apparently, there was some bleeding per rectum also noted at that time. She also had a sat of 77%. Subsequently patient became alert and responsive. Apparently, she had some bloody stool earlier today as well, but there is no reports of black colored stool at home. Patient continues to be somewhat lethargic. She is awake, alert, however, does not really answer any questions. According to the daughter this is new. However, I think the daughter's account is also not very reliable at this time. Patient has a cast in her right leg for fracture sustained in November. There has been a lot of issue with healing. She is supposed to be nonweight bearing on that side. Patient did have abdominal pain, earlier today. So, very sketchy and poor history because of the above reasons.  Home Medications: Prior to Admission medications   Medication Sig Start Date End Date  Taking? Authorizing Provider  aspirin EC 325 MG tablet Take 325 mg by mouth daily.   Yes Historical Provider, MD  atorvastatin (LIPITOR) 10 MG tablet Take 1 tablet by mouth Daily. 10/02/11  Yes Historical Provider, MD  calcium acetate, Phos Binder, (PHOSLYRA) 667 MG/5ML SOLN Take 667-1,334 mg by mouth 3 (three) times daily with meals. Take 10 milliliters (ML) (1334mg  total)  with meals and take 5 milliliters (ML) (667mg  total) with snacks   Yes Historical Provider, MD  carbidopa-levodopa (SINEMET IR) 25-100 MG per tablet Take 1.5 tablets by mouth 3 (three) times daily.  09/26/11  Yes Historical Provider, MD  carvedilol (COREG) 6.25 MG tablet Take 1 tablet (6.25 mg total) by mouth 2 (two) times daily with a meal. 01/07/12  Yes Elease Etienne, MD  donepezil (ARICEPT) 10 MG tablet Take 1 tablet by mouth Daily. 10/02/11  Yes Historical Provider, MD  fexofenadine (ALLEGRA) 180 MG tablet Take 180 mg by mouth daily.   Yes Historical Provider, MD  FLUoxetine (PROZAC) 40 MG capsule Take 1 capsule by mouth daily.  10/02/11  Yes Historical Provider, MD  HYDROcodone-acetaminophen (NORCO/VICODIN) 5-325 MG per tablet Take 1 tablet by mouth every 6 (six) hours as needed for pain. 02/27/12  Yes Wende Neighbors, PA  HYDROcodone-acetaminophen (NORCO/VICODIN) 5-325 MG per tablet Take 1-2 tablets by mouth every 6 (six) hours as needed. For pain 01/07/12 03/09/12 Yes Kerrin Champagne, MD  insulin glargine (LANTUS) 100 UNIT/ML injection Inject 5 Units into the skin at bedtime.    Yes Historical Provider, MD  insulin lispro (HUMALOG) 100 UNIT/ML injection Inject into the skin 3 (three)  times daily before meals. As directed per sliding scale   Yes Historical Provider, MD  levothyroxine (SYNTHROID, LEVOTHROID) 25 MCG tablet Take 25 mcg by mouth every morning.   Yes Historical Provider, MD  loperamide (IMODIUM) 2 MG capsule Take 2 mg by mouth daily as needed. For anti-diarrheal   Yes Historical Provider, MD  multivitamin (RENA-VIT) TABS  tablet Take 1 tablet by mouth daily.   Yes Historical Provider, MD  cephALEXin (KEFLEX) 250 MG capsule Take 1 capsule (250 mg total) by mouth every 12 (twelve) hours. 02/27/12   Wende Neighbors, PA    Allergies: No Known Allergies  Past Medical History: Past Medical History  Diagnosis Date  . Hyperlipidemia   . Hypertension   . Diabetes mellitus   . DVT (deep venous thrombosis)   . End stage renal disease on dialysis   . Stroke   . Dementia   . Bone fracture 01/04/12    Fracture- rt tib fib    Past Surgical History  Procedure Date  . Dg av dialysis graft declot or   . Av fistula repair Aug. 2013    Left arm  . Cholecystectomy   . Colon surgery     Aprrox 2006 at [facility] after she had a colonoscopy    Social History:  reports that she has been passively smoking.  She has never used smokeless tobacco. She reports that she does not drink alcohol or use illicit drugs.  Living Situation: She lives with her husband Activity Level: Gets around in a wheelchair   Family History:  Family History  Problem Relation Age of Onset  . Stroke Mother   . Diabetes Mother   . Cancer Mother   . Hypertension Mother   . Coronary artery disease Father   . Diabetes Sister      Review of Systems - Unable to do due to confusion  Physical Examination  Filed Vitals:   03/09/12 1615 03/09/12 1617 03/09/12 1800 03/09/12 1800  BP: 148/81 148/81 133/97   Pulse:  118    Temp:  97.8 F (36.6 C)    TempSrc:  Oral    Resp:  16 18 18   Height:  5\' 7"  (1.702 m)    Weight:  77.111 kg (170 lb)    SpO2:  100%  98%    General appearance: alert, appears stated age, distracted and no distress Head: Normocephalic, without obvious abnormality, atraumatic Eyes: conjunctivae/corneas clear. PERRL, EOM's intact.  Throat: lips, mucosa, and tongue normal; teeth and gums normal Neck: no adenopathy, no carotid bruit, no JVD, supple, symmetrical, trachea midline and thyroid not enlarged, symmetric, no  tenderness/mass/nodules Resp: clear to auscultation bilaterally Cardio: S1, S2 is irregularly irregular. Tachycardic. No S3, S4, Mr. no rubs, bruits. Systolic murmur appreciated over the apex GI: Abdomen is soft. We can assist present diffusely, without any rebound, rigidity, or guarding. No masses, or organomegaly appreciated Extremities: Right lower extremity is covered in a cast. Left heel is covered in dressing. Pulses: 2+ and symmetric Skin: Chronic skin changes in the lower extremities Lymph nodes: Cervical, supraclavicular, and axillary nodes normal. Neurologic: She is alert. She follow certain commands. However, does not answer questions quickly. Takes a lot of persuasion. She appears to have generalized weakness. Cranial nerves examination was not found to be abnormal. Gait could not be assessed   Laboratory Data: Results for orders placed during the hospital encounter of 03/09/12 (from the past 48 hour(s))  OCCULT BLOOD, POC DEVICE     Status:  Abnormal   Collection Time   03/09/12  4:24 PM      Component Value Range Comment   Fecal Occult Bld POSITIVE (*) NEGATIVE   BASIC METABOLIC PANEL     Status: Abnormal   Collection Time   03/09/12  4:42 PM      Component Value Range Comment   Sodium 143  135 - 145 mEq/L    Potassium 3.3 (*) 3.5 - 5.1 mEq/L    Chloride 99  96 - 112 mEq/L    CO2 34 (*) 19 - 32 mEq/L    Glucose, Bld 150 (*) 70 - 99 mg/dL    BUN 11  6 - 23 mg/dL    Creatinine, Ser 1.61 (*) 0.50 - 1.10 mg/dL    Calcium 9.4  8.4 - 09.6 mg/dL    GFR calc non Af Amer 12 (*) >90 mL/min    GFR calc Af Amer 14 (*) >90 mL/min   CBC WITH DIFFERENTIAL     Status: Abnormal   Collection Time   03/09/12  4:42 PM      Component Value Range Comment   WBC 6.1  4.0 - 10.5 K/uL    RBC 3.49 (*) 3.87 - 5.11 MIL/uL    Hemoglobin 10.8 (*) 12.0 - 15.0 g/dL    HCT 04.5  40.9 - 81.1 %    MCV 103.2 (*) 78.0 - 100.0 fL    MCH 30.9  26.0 - 34.0 pg    MCHC 30.0  30.0 - 36.0 g/dL    RDW 91.4 (*)  78.2 - 15.5 %    Platelets 262  150 - 400 K/uL    Neutrophils Relative 74  43 - 77 %    Neutro Abs 4.5  1.7 - 7.7 K/uL    Lymphocytes Relative 17  12 - 46 %    Lymphs Abs 1.0  0.7 - 4.0 K/uL    Monocytes Relative 6  3 - 12 %    Monocytes Absolute 0.4  0.1 - 1.0 K/uL    Eosinophils Relative 2  0 - 5 %    Eosinophils Absolute 0.1  0.0 - 0.7 K/uL    Basophils Relative 0  0 - 1 %    Basophils Absolute 0.0  0.0 - 0.1 K/uL   PROTIME-INR     Status: Normal   Collection Time   03/09/12  4:42 PM      Component Value Range Comment   Prothrombin Time 14.0  11.6 - 15.2 seconds    INR 1.09  0.00 - 1.49   APTT     Status: Abnormal   Collection Time   03/09/12  4:42 PM      Component Value Range Comment   aPTT 43 (*) 24 - 37 seconds   LACTIC ACID, PLASMA     Status: Normal   Collection Time   03/09/12  4:44 PM      Component Value Range Comment   Lactic Acid, Venous 1.8  0.5 - 2.2 mmol/L   TYPE AND SCREEN     Status: Normal   Collection Time   03/09/12  4:46 PM      Component Value Range Comment   ABO/RH(D) O POS      Antibody Screen NEG      Sample Expiration 03/12/2012       Radiology Reports: Dg Chest Port 1 View  03/09/2012  *RADIOLOGY REPORT*  Clinical Data: Shortness of breath.  PORTABLE CHEST - 1 VIEW  Comparison:  02/27/2012  Findings: Again noted is a right jugular dialysis catheter. Catheter tip is in the lower SVC region.  Lungs are clear without airspace disease or edema.  The thoracic aorta is heavily calcified.  Heart size is within normal limits.  IMPRESSION: No acute chest findings.  Dialysis catheter.   Original Report Authenticated By: Richarda Overlie, M.D.     Electrocardiogram: EKG shows atrial fibrillation at 110 beats per minute. Normal axis. Intervals show that there might be prolonged QT. No ST changes of concern. Nonspecific T wave, changes. The EKG from November of 2013 shows atrial fibrillation as well.  Problem List  Principal Problem:  *Hematochezia Active Problems:   DM  HYPERTENSION  Chronic systolic heart failure  ESRD  Peripheral vascular disease, unspecified  Skin ulcer of great toe  Decubitus ulcer of heel, stage 1  Acute encephalopathy  Atrial fibrillation  Macrocytic anemia   Assessment: This is a 79 year old, African American female, presents with the confusion and rectal bleeding. Was hypotensive at dialysis. She has abdominal pain. She's found to be in A. fib and there is no documented history of the same.   Plan: #1 hematochezia: Daughter reports surgery after a colonoscopy about 8 years ago at [facility]. We'll try to get the records of the same. Gastroenterology has already been notified about this current episode. We'll monitor CBCs closely. She is hemodynamically stable at this time. Her coagulation profile is normal. We'll hold her aspirin. Due to abdominal tenderness we will proceed with a CT of the abdomen and pelvis without contrast  #2 acute encephalopathy: Etiology is unclear. Could be due to the hypotensive episode. Could be her underlying dementia. There no focal deficits identified. She's unable to move the right leg because of the cast. We will get a CT head to begin with. And, then pursue other etiologies. She may need MRI as well. Swallow evaluation. Keep her n.p.o. for aspiration risk.  #3 atrial fibrillation: This appears to be a new diagnosis for her. She did have A fib on previous EKGs however there is no documentation of the same. I will consult cardiology. We will monitor her on telemetry. No anticoagulation due to rectal bleeding at this time. Echocardiogram will be obtained. TSH and free T4 will be checked.  #4 right toe ulcer: Appears to be pressure ulcer. Vascular evaluation recently did not show any significant disease.  #5 Old right tibia, fibula fracture, with a cast. She is nonweightbearing at this time. She has a followup with orthopedics later this week.  #6 history of, diabetes: Because of acute, encephalopathy  we'll be keeping her n.p.o. We will give her sliding scale coverage for now. We'll hold her Lantus.  #7 end-stage renal disease on hemodialysis: Nephrology will be consulted. She had shortened dialysis today. We'll need to assess tomorrow if she needs any dialysis. However, I think she can wait till Wednesday.  #8 microcytic anemia: Will check an anemia panel. CBC we'll monitor closely for evidence of acute blood loss.  #9 history of hypothyroidism: Continue with levothyroxine, when she is able to take orally. Thyroid function tests will be checked   DVT Prophylaxis: SCDs Code Status: Full code Family Communication: Discussed with the daughter at bedside  Disposition Plan: Unclear for now   Further management decisions will depend on results of further testing and patient's response to treatment.  [facility]  [facility] Pager [phone]  If 7PM-7AM, please contact night-coverage [email or web address] Password [facility]  03/09/2012, 7:55 PM  Ct head and Ct abdomen/pelvis  report reviewed. Patient is afebrile with normal WBC. This is likely diverticular bleed. Will wait for GI input in AM. Hold off on antibiotics.   Dalan Cowger 12:55 AM

## 2012-03-09 NOTE — ED Notes (Signed)
Patient brought from dialysis via EMS for hypotension. Per EMS osculated 84 for systolic but unable to get diastolic. Patient placed in trendelenburg position. Per EMS patient has BM that was GI in nature-blood noted, foul odor. EMS also reports O2 sat of 77% in which they applied 5 liters of oxygen bringing 02 sat to 99%. Patient alert and responsive.

## 2012-03-10 ENCOUNTER — Encounter (HOSPITAL_COMMUNITY)
Admission: EM | Disposition: A | Discharge status: Home Health | Payer: Self-pay | Source: Home / Self Care | Attending: Internal Medicine

## 2012-03-10 ENCOUNTER — Other Ambulatory Visit: Payer: Self-pay

## 2012-03-10 ENCOUNTER — Encounter (HOSPITAL_COMMUNITY): Payer: Self-pay | Admitting: Internal Medicine

## 2012-03-10 DIAGNOSIS — I359 Nonrheumatic aortic valve disorder, unspecified: Secondary | ICD-10-CM

## 2012-03-10 DIAGNOSIS — D126 Benign neoplasm of colon, unspecified: Secondary | ICD-10-CM

## 2012-03-10 DIAGNOSIS — K922 Gastrointestinal hemorrhage, unspecified: Secondary | ICD-10-CM

## 2012-03-10 DIAGNOSIS — Z85038 Personal history of other malignant neoplasm of large intestine: Secondary | ICD-10-CM

## 2012-03-10 DIAGNOSIS — L8991 Pressure ulcer of unspecified site, stage 1: Secondary | ICD-10-CM

## 2012-03-10 DIAGNOSIS — L89609 Pressure ulcer of unspecified heel, unspecified stage: Secondary | ICD-10-CM

## 2012-03-10 DIAGNOSIS — K5730 Diverticulosis of large intestine without perforation or abscess without bleeding: Secondary | ICD-10-CM

## 2012-03-10 DIAGNOSIS — K625 Hemorrhage of anus and rectum: Secondary | ICD-10-CM

## 2012-03-10 DIAGNOSIS — E119 Type 2 diabetes mellitus without complications: Secondary | ICD-10-CM

## 2012-03-10 DIAGNOSIS — G20 Parkinson's disease: Secondary | ICD-10-CM | POA: Diagnosis present

## 2012-03-10 HISTORY — PX: ESOPHAGOGASTRODUODENOSCOPY: SHX5428

## 2012-03-10 HISTORY — PX: COLONOSCOPY: SHX5424

## 2012-03-10 HISTORY — DX: Gastrointestinal hemorrhage, unspecified: K92.2

## 2012-03-10 LAB — COMPREHENSIVE METABOLIC PANEL
ALT: <5 U/L (ref 0–35)
Alkaline Phosphatase: 118 U/L — ABNORMAL HIGH (ref 39–117)
CO2: 31 mEq/L (ref 19–32)
Chloride: 100 mEq/L (ref 96–112)
GFR calc Af Amer: 11 mL/min — ABNORMAL LOW (ref >90)
GFR calc non Af Amer: 9 mL/min — ABNORMAL LOW (ref >90)
Glucose, Bld: 119 mg/dL — ABNORMAL HIGH (ref 70–99)
Potassium: 3.8 mEq/L (ref 3.5–5.1)
Sodium: 141 mEq/L (ref 135–145)

## 2012-03-10 LAB — CBC
HCT: 32.5 % — ABNORMAL LOW (ref 36.0–46.0)
HCT: 32.1 % — ABNORMAL LOW (ref 36.0–46.0)
HCT: 31.2 % — ABNORMAL LOW (ref 36.0–46.0)
Hemoglobin: 9.6 g/dL — ABNORMAL LOW (ref 12.0–15.0)
MCH: 30.5 pg (ref 26.0–34.0)
MCH: 30.4 pg (ref 26.0–34.0)
MCHC: 29.9 g/dL — ABNORMAL LOW (ref 30.0–36.0)
MCV: 103.2 fL — ABNORMAL HIGH (ref 78.0–100.0)
MCV: 101.9 fL — ABNORMAL HIGH (ref 78.0–100.0)
MCV: 102 fL — ABNORMAL HIGH (ref 78.0–100.0)
Platelets: 236 10*3/uL (ref 150–400)
RBC: 3.15 MIL/uL — ABNORMAL LOW (ref 3.87–5.11)
RBC: 3.06 MIL/uL — ABNORMAL LOW (ref 3.87–5.11)
RDW: 19.2 % — ABNORMAL HIGH (ref 11.5–15.5)
WBC: 8 10*3/uL (ref 4.0–10.5)

## 2012-03-10 LAB — IRON AND TIBC
Saturation Ratios: 47 % (ref 20–55)
TIBC: 148 ug/dL — ABNORMAL LOW (ref 250–470)
UIBC: 79 ug/dL — ABNORMAL LOW (ref 125–400)

## 2012-03-10 LAB — GLUCOSE, CAPILLARY

## 2012-03-10 SURGERY — COLONOSCOPY
Anesthesia: Moderate Sedation

## 2012-03-10 MED ORDER — MEPERIDINE HCL 50 MG/ML IJ SOLN
INTRAMUSCULAR | Status: DC | PRN
  Administered 2012-03-10: 15 mg via INTRAVENOUS

## 2012-03-10 MED ORDER — CARBIDOPA-LEVODOPA 25-100 MG PO TABS
1.5000 | ORAL_TABLET | Freq: Three times a day (TID) | ORAL | Status: DC
  Administered 2012-03-10 – 2012-03-12 (×5): 1.5 via ORAL
  Filled 2012-03-10: qty 2
  Filled 2012-03-10: qty 1
  Filled 2012-03-10 (×4): qty 2

## 2012-03-10 MED ORDER — HYDRALAZINE HCL 20 MG/ML IJ SOLN: 5.0000 mg | INTRAMUSCULAR | Status: DC | PRN

## 2012-03-10 MED ORDER — FLUOXETINE HCL 20 MG PO CAPS
20.0000 mg | ORAL_CAPSULE | Freq: Every day | ORAL | Status: DC
  Administered 2012-03-11 – 2012-03-12 (×2): 20 mg via ORAL
  Filled 2012-03-10 (×2): qty 1

## 2012-03-10 MED ORDER — PEG 3350-KCL-NABCB-NACL-NASULF 236 G PO SOLR
4000.0000 mL | Freq: Once | ORAL | Status: AC
  Administered 2012-03-10: 4000 mL via ORAL
  Filled 2012-03-10: qty 4000

## 2012-03-10 MED ORDER — DONEPEZIL HCL 5 MG PO TABS
10.0000 mg | ORAL_TABLET | Freq: Every day | ORAL | Status: DC
  Administered 2012-03-10 – 2012-03-12 (×2): 10 mg via ORAL
  Filled 2012-03-10 (×2): qty 2

## 2012-03-10 MED ORDER — MEPERIDINE HCL 50 MG/ML IJ SOLN
INTRAMUSCULAR | Status: AC
Start: 2012-03-10 — End: 2012-03-11
  Filled 2012-03-10: qty 1

## 2012-03-10 MED ORDER — MIDAZOLAM HCL 5 MG/5ML IJ SOLN
INTRAMUSCULAR | Status: AC
  Filled 2012-03-10: qty 10

## 2012-03-10 MED ORDER — SODIUM CHLORIDE 0.9 % IV SOLN
INTRAVENOUS | Status: DC
  Administered 2012-03-10: 14:00:00 via INTRAVENOUS

## 2012-03-10 MED ORDER — MIDAZOLAM HCL 5 MG/5ML IJ SOLN
INTRAMUSCULAR | Status: DC | PRN
  Administered 2012-03-10: 2 mg via INTRAVENOUS

## 2012-03-10 MED ORDER — CARVEDILOL 3.125 MG PO TABS
6.2500 mg | ORAL_TABLET | Freq: Two times a day (BID) | ORAL | Status: DC
  Administered 2012-03-10: 6.25 mg via ORAL
  Filled 2012-03-10: qty 2

## 2012-03-10 MED ORDER — STERILE WATER FOR IRRIGATION IR SOLN
Status: DC | PRN
  Administered 2012-03-10: 14:00:00

## 2012-03-10 NOTE — Consult Note (Signed)
Reason for Consult: End-stage renal disease Referring Physician: Dr. Tobin Chad Katrina Katrina Meyer is an 79 y.o. female.  HPI: She is a patient was history of diabetes, hypertension, end-stage renal disease on maintenance hemodialysis presently brought to the emergency room because of hypotension and also rectal bleeding. Presently patient offers no complaints. Patient with severe dementia. There is no history of nausea or vomiting. She status post hemodialysis yesterday.  Past Medical History  Diagnosis Date  . Hyperlipidemia   . Hypertension   . Diabetes mellitus   . DVT (deep venous thrombosis)   . End stage renal disease on dialysis   . Stroke   . Dementia   . Parkinson'Katrina Meyer disease   . Tibia/fibula fracture 01/04/2012    Treated with long leg casting. Cast change in hospital 02/27/12   . Skin ulcer of great toe 02/27/2012    Right great toe   . Hematochezia 03/09/2012  . Atrial fibrillation 03/09/2012    Seen on older EKG as well but family not aware.   . Macrocytic anemia 03/09/2012    Past Surgical History  Procedure Date  . Dg av dialysis graft declot or   . Av fistula repair Aug. 2013    Left arm  . Cholecystectomy   . Colon surgery     Aprrox 2006 at [facility] after she had a colonoscopy    Family History  Problem Relation Age of Onset  . Stroke Mother   . Diabetes Mother   . Cancer Mother   . Hypertension Mother   . Coronary artery disease Father   . Diabetes Sister     Social History:  reports that she has been passively smoking.  She has never used smokeless tobacco. She reports that she does not drink alcohol or use illicit drugs.  Allergies: No Known Allergies  Medications: I have reviewed the patient'Katrina Meyer current medications.  Results for orders placed during the hospital encounter of 03/09/12 (from the past 48 hour(Katrina Meyer))  OCCULT BLOOD, POC DEVICE     Status: Abnormal   Collection Time   03/09/12  4:24 PM      Component Value Range Comment   Fecal Occult Bld POSITIVE  (*) NEGATIVE   BASIC METABOLIC PANEL     Status: Abnormal   Collection Time   03/09/12  4:42 PM      Component Value Range Comment   Sodium 143  135 - 145 mEq/L    Potassium 3.3 (*) 3.5 - 5.1 mEq/L    Chloride 99  96 - 112 mEq/L    CO2 34 (*) 19 - 32 mEq/L    Glucose, Bld 150 (*) 70 - 99 mg/dL    BUN 11  6 - 23 mg/dL    Creatinine, Ser 1.61 (*) 0.50 - 1.10 mg/dL    Calcium 9.4  8.4 - 09.6 mg/dL    GFR calc non Af Amer 12 (*) >90 mL/min    GFR calc Af Amer 14 (*) >90 mL/min   CBC WITH DIFFERENTIAL     Status: Abnormal   Collection Time   03/09/12  4:42 PM      Component Value Range Comment   WBC 6.1  4.0 - 10.5 K/uL    RBC 3.49 (*) 3.87 - 5.11 MIL/uL    Hemoglobin 10.8 (*) 12.0 - 15.0 g/dL    HCT 04.5  40.9 - 81.1 %    MCV 103.2 (*) 78.0 - 100.0 fL    MCH 30.9  26.0 - 34.0 pg  MCHC 30.0  30.0 - 36.0 g/dL    RDW 29.5 (*) 28.4 - 15.5 %    Platelets 262  150 - 400 K/uL    Neutrophils Relative 74  43 - 77 %    Neutro Abs 4.5  1.7 - 7.7 K/uL    Lymphocytes Relative 17  12 - 46 %    Lymphs Abs 1.0  0.7 - 4.0 K/uL    Monocytes Relative 6  3 - 12 %    Monocytes Absolute 0.4  0.1 - 1.0 K/uL    Eosinophils Relative 2  0 - 5 %    Eosinophils Absolute 0.1  0.0 - 0.7 K/uL    Basophils Relative 0  0 - 1 %    Basophils Absolute 0.0  0.0 - 0.1 K/uL   PROTIME-INR     Status: Normal   Collection Time   03/09/12  4:42 PM      Component Value Range Comment   Prothrombin Time 14.0  11.6 - 15.2 seconds    INR 1.09  0.00 - 1.49   APTT     Status: Abnormal   Collection Time   03/09/12  4:42 PM      Component Value Range Comment   aPTT 43 (*) 24 - 37 seconds   LACTIC ACID, PLASMA     Status: Normal   Collection Time   03/09/12  4:44 PM      Component Value Range Comment   Lactic Acid, Venous 1.8  0.5 - 2.2 mmol/L   TYPE AND SCREEN     Status: Normal   Collection Time   03/09/12  4:46 PM      Component Value Range Comment   ABO/RH(D) O POS      Antibody Screen NEG      Sample Expiration  03/12/2012     LIPASE, BLOOD     Status: Normal   Collection Time   03/09/12  8:04 PM      Component Value Range Comment   Lipase 37  11 - 59 U/L   RETICULOCYTES     Status: Abnormal   Collection Time   03/09/12  8:04 PM      Component Value Range Comment   Retic Ct Pct 4.5 (*) 0.4 - 3.1 %    RBC. 3.47 (*) 3.87 - 5.11 MIL/uL    Retic Count, Manual 156.2  19.0 - 186.0 K/uL   GLUCOSE, CAPILLARY     Status: Abnormal   Collection Time   03/09/12 11:17 PM      Component Value Range Comment   Glucose-Capillary 162 (*) 70 - 99 mg/dL   CBC     Status: Abnormal   Collection Time   03/10/12  5:02 AM      Component Value Range Comment   WBC 8.0  4.0 - 10.5 K/uL    RBC 3.15 (*) 3.87 - 5.11 MIL/uL    Hemoglobin 9.7 (*) 12.0 - 15.0 g/dL    HCT 13.2 (*) 44.0 - 46.0 %    MCV 103.2 (*) 78.0 - 100.0 fL    MCH 30.8  26.0 - 34.0 pg    MCHC 29.8 (*) 30.0 - 36.0 g/dL    RDW 10.2 (*) 72.5 - 15.5 %    Platelets 239  150 - 400 K/uL   COMPREHENSIVE METABOLIC PANEL     Status: Abnormal   Collection Time   03/10/12  5:02 AM      Component Value Range Comment  Sodium 141  135 - 145 mEq/L    Potassium 3.8  3.5 - 5.1 mEq/L    Chloride 100  96 - 112 mEq/L    CO2 31  19 - 32 mEq/L    Glucose, Bld 119 (*) 70 - 99 mg/dL    BUN 19  6 - 23 mg/dL    Creatinine, Ser 1.61 (*) 0.50 - 1.10 mg/dL    Calcium 9.4  8.4 - 09.6 mg/dL    Total Protein 5.4 (*) 6.0 - 8.3 g/dL    Albumin 2.7 (*) 3.5 - 5.2 g/dL    AST 14  0 - 37 U/L    ALT <5  0 - 35 U/L    Alkaline Phosphatase 118 (*) 39 - 117 U/L    Total Bilirubin 0.3  0.3 - 1.2 mg/dL    GFR calc non Af Amer 9 (*) >90 mL/min    GFR calc Af Amer 11 (*) >90 mL/min   GLUCOSE, CAPILLARY     Status: Abnormal   Collection Time   03/10/12  8:02 AM      Component Value Range Comment   Glucose-Capillary 113 (*) 70 - 99 mg/dL    Comment 1 Notify RN       Ct Abdomen Pelvis Wo Contrast  03/10/2012  *RADIOLOGY REPORT*  Clinical Data: Abdominal pain.  Rectal bleeding.  End-stage  renal disease.  Diabetic.  Hypertension.  CT ABDOMEN AND PELVIS WITHOUT CONTRAST  Technique:  Multidetector CT imaging of the abdomen and pelvis was performed following the standard protocol without intravenous contrast.  Comparison: 07/26/2010 ultrasound of the kidneys.  09/21/2004 CT of the abdomen and pelvis.  Findings: Prior partial colectomy.  Sigmoid and descending colon diverticula.  Two diverticula with minimal haziness along the periphery and therefore very low-level inflammation not entirely excluded in the proper clinical setting. No free intraperitoneal air free fluid.  Marked calcification of all visualized arterial structures with ectasia of the abdominal aorta with focal bulge of the lower thoracic aorta measuring 2.7 cm maximal dimension.  Narrowing of vessels by the significant calcified plaque most prominent left renal artery.  Tortuous splenic artery without aneurysm noted.  Atrophic kidneys with multiple low density renal lesions most of which appear to be cysts although some too small to characterize.  Evaluation of solid abdominal viscera is limited by lack of IV contrast.  Taking this limitation into account no worrisome hepatic, splenic, pancreatic or adrenal lesion.  Small calcifications are noted within the liver and spleen.  Small hiatal hernia.  Pleural thickening lung bases partially calcified on the right.  Coronary artery calcifications and calcification mitral valve and aortic valve.  Schmorl'Katrina Meyer node deformity/superior plate compression fracture L3 appears remote.  Calcified uterine fibroids.  Decompressed urinary bladder.  No calcified gallstone.  IMPRESSION: Sigmoid and descending colon diverticula.  Two diverticula with minimal haziness along the periphery and therefore very low-level inflammation not entirely excluded in the proper clinical setting.  Atherosclerotic type changes as detailed above.  Small hiatal hernia.  Atrophic kidneys with renal cysts as detailed above.    Original Report Authenticated By: Lacy Duverney, M.D.    Ct Head Wo Contrast  03/10/2012  *RADIOLOGY REPORT*  Clinical Data: Unresponsive earlier today.  Confusion.  History of end-stage renal disease, dialysis, high blood pressure, diabetes and Parkinson'Katrina Meyer disease.  CT HEAD WITHOUT CONTRAST  Technique:  Contiguous axial images were obtained from the base of the skull through the vertex without contrast.  Comparison: 01/04/2012.  Findings:  No intracranial hemorrhage.  Remote infarcts involving the basal ganglia bilaterally.  There are prominent small vessel disease type changes.  No CT evidence of large acute infarct.  Small infarct would be difficult to exclude in this setting.  Global atrophy without hydrocephalus.  Prominent vascular calcifications.  Partially empty sella.  No intracranial mass lesion detected on this unenhanced exam.  IMPRESSION: No intracranial hemorrhage or CT findings of large acute infarct.  Remote infarcts and prominent small vessel disease type changes.  Atrophy.   Original Report Authenticated By: Lacy Duverney, M.D.    Dg Chest Port 1 View  03/09/2012  *RADIOLOGY REPORT*  Clinical Data: Shortness of breath.  PORTABLE CHEST - 1 VIEW  Comparison: 02/27/2012  Findings: Again noted is a right jugular dialysis catheter. Catheter tip is in the lower SVC region.  Lungs are clear without airspace disease or edema.  The thoracic aorta is heavily calcified.  Heart size is within normal limits.  IMPRESSION: No acute chest findings.  Dialysis catheter.   Original Report Authenticated By: Richarda Overlie, M.D.     Review of Systems  Gastrointestinal: Positive for blood in stool. Negative for nausea and vomiting.  Neurological: Positive for weakness.  Psychiatric/Behavioral: Positive for memory loss.   Blood pressure 140/56, pulse 66, temperature 98.1 F (36.7 C), temperature source Oral, resp. rate 18, height 5\' 7"  (1.702 m), weight 78.926 kg (174 lb), SpO2 100.00%. Physical Exam  Eyes: No  scleral icterus.  Neck: No JVD present.  Cardiovascular: Normal rate and regular rhythm.   Respiratory: No respiratory distress. She has no wheezes. She has no rales.  GI: There is no tenderness.  Musculoskeletal: She exhibits no edema.    Assessment/Plan: Problem #1 end-stage renal disease she status post hemodialysis yesterday her BUN and creatinine is was in acceptable range. Potassium is normal patient does not have any sign of fluid overload. Problem #2 diabetes Problem #3 hypertension her blood pressure seems reasonably controlled Problem #4 history of rectal bleeding. Patient is going to be seen by GI. Her hemoglobin and hematocrit is low possibly a combination of GI bleeding and also end-stage Reynolds is. Problem #5 history of a trial fibrillation Problem #6 peripheral vascular disease Problem #7 history of right tibial fracture presently patient has a cast. Problem #8 history of dementia Plan: We'll make arrangements for patient to get dialysis tomorrow We'll hold heparin and we'll check also her CBC and basic metabolic panel. Will r continue with  Epogen.  Katrina Katrina Meyer 03/10/2012, 8:34 AM

## 2012-03-10 NOTE — Op Note (Signed)
COLONOSCOPY PROCEDURE REPORT  PATIENT:  Katrina Meyer  MR#:  [id number] Birthdate:  [birth year], 79 y.o., female Endoscopist:  Dr. Malissa Hippo, MD Referred By:  Dr. Bonnetta Barry ref. provider found Procedure Date: 2012-03-15  Procedure:   Colonoscopy with snare polypectomy and Hemoclip application.  Indications:  Patient is 79 year old African female with acute GI bleed felt to be from lower GI tract. she has history of colonic adenomas and underwent either right, colectomy or resection of segment of ascending colon in 2006.  Informed Consent:  The procedure and risks were reviewed with the patient and informed consent was obtained.  Medications:  Demerol 15 mg IV Versed 2 mg IV  Description of procedure:  After a digital rectal exam was performed, that colonoscope was advanced from the anus through the rectum and colon to the area the hepatic flexure where ileocolonic anastomosis was identified. Picture was taken for the record and scope was slowly and cautiously withdrawn. The mucosal surfaces were carefully surveyed utilizing scope tip to flexion to facilitate fold flattening as needed. The scope was pulled down into the rectum where a thorough exam including retroflexion was performed.  Findings:   Prep fair. Multiple diverticula at sigmoid colon and none with stigmata of bleed. 2 polyps snared from transverse colon and submitted together( 8 mm and 10 mm). Small polyps at transverse colon were coagulated using snare tip. 2 small polyps snared from descending colon(6 mm and 8 mm). Large pedunculated polyp(over 30 mm) snared from sigmoid colon. Single Hemoclip applied to polypectomy site. Normal rectal mucosa and anal rectal junction to   Therapeutic/Diagnostic Maneuvers Performed:  See above  Complications:  None  Colon  Withdrawal Time:  28  minutes  Impression:  Six polyps snared including large pedunculated polyp over 30 mm in the sigmoid colon with ulcerated surface. She possibly  bled from this polyp. Single Hemoclip applied to polypectomy site. Two small polyps coagulated as above. Multiple diverticula at sigmoid colon.  Recommendations:  Standard instructions given. Advance diet. No aspirin for one week. CBC in a.m. Patient should not have MRI until Hemoclip has passed.  Ressie Slevin U  Mar 15, 2012 3:00 PM  CC: Dr. Ignatius Specking., MD & Dr. Bonnetta Barry ref. provider found

## 2012-03-10 NOTE — Progress Notes (Signed)
Subjective: The patient is lying in bed. She has no complaints of headache, chest pain, shortness of breath, or abdominal pain. Her caretaker is in the room. Questions answered.  Objective: Vital signs in last 24 hours: Filed Vitals:   03/09/12 2002 03/09/12 2318 03/10/12 0531 03/10/12 0701  BP: 121/83 146/89 168/61 140/56  Pulse: 107  70 66  Temp: 98.2 F (36.8 C)  98.1 F (36.7 C)   TempSrc: Oral  Oral   Resp: 18  18   Height: 5\' 7"  (1.702 m)     Weight: 73.211 kg (161 lb 6.4 oz)  78.926 kg (174 lb)   SpO2: 100%  100%     Intake/Output Summary (Last 24 hours) at 03/10/12 1008 Last data filed at 03/10/12 0600  Gross per 24 hour  Intake    340 ml  Output      0 ml  Net    340 ml    Weight change:  Physical exam: General: Pleasant overweight 79 year old African-American woman laying in bed, alert, and in no acute distress. Lungs: Clear anteriorly with decreased breath sounds in the bases. Heart: S1, S2, with ectopy versus irregular, irregular. Abdomen: Obese, positive bowel sounds, mildly tender over the lower abdomen, no distention, no masses palpated. Extremities: Cast on the right leg, stable. Trace of bilateral pedal edema. Bandage is on the heel of the left foot, not taken off. The patient is able to wiggle her toes bilaterally on command. Neurologic: She is alert and oriented to herself and hospital. Cranial nerves II through XII appear to be grossly intact. No focal abnormalities.   Lab Results: Basic Metabolic Panel:  Basename 03/10/12 0502 03/09/12 1642  NA 141 143  K 3.8 3.3*  CL 100 99  CO2 31 34*  GLUCOSE 119* 150*  BUN 19 11  CREATININE 4.23* 3.32*  CALCIUM 9.4 9.4  MG -- --  PHOS -- --   Liver Function Tests:  Basename 03/10/12 0502  AST 14  ALT <5  ALKPHOS 118*  BILITOT 0.3  PROT 5.4*  ALBUMIN 2.7*    Basename 03/09/12 2004  LIPASE 37  AMYLASE --   No results found for this basename: AMMONIA:2 in the last 72 hours CBC:  Basename  03/10/12 0502 03/09/12 1642  WBC 8.0 6.1  NEUTROABS -- 4.5  HGB 9.7* 10.8*  HCT 32.5* 36.0  MCV 103.2* 103.2*  PLT 239 262   Cardiac Enzymes: No results found for this basename: CKTOTAL:3,CKMB:3,CKMBINDEX:3,TROPONINI:3 in the last 72 hours BNP: No results found for this basename: PROBNP:3 in the last 72 hours D-Dimer: No results found for this basename: DDIMER:2 in the last 72 hours CBG:  Basename 03/10/12 0802 03/10/12 0536 03/09/12 2317  GLUCAP 113* 122* 162*   Hemoglobin A1C: No results found for this basename: HGBA1C in the last 72 hours Fasting Lipid Panel: No results found for this basename: CHOL,HDL,LDLCALC,TRIG,CHOLHDL,LDLDIRECT in the last 72 hours Thyroid Function Tests: No results found for this basename: TSH,T4TOTAL,FREET4,T3FREE,THYROIDAB in the last 72 hours Anemia Panel:  Basename 03/09/12 2004  VITAMINB12 --  FOLATE --  FERRITIN --  TIBC --  IRON --  RETICCTPCT 4.5*   Coagulation:  Basename 03/09/12 1642  LABPROT 14.0  INR 1.09   Urine Drug Screen: Drugs of Abuse  No results found for this basename: labopia, cocainscrnur, labbenz, amphetmu, thcu, labbarb    Alcohol Level: No results found for this basename: ETH:2 in the last 72 hours Urinalysis: No results found for this basename: COLORURINE:2,APPERANCEUR:2,LABSPEC:2,PHURINE:2,GLUCOSEU:2,HGBUR:2,BILIRUBINUR:2,KETONESUR:2,PROTEINUR:2,UROBILINOGEN:2,NITRITE:2,LEUKOCYTESUR:2 in the last  72 hours Misc. Labs:   Micro: No results found for this or any previous visit (from the past 240 hour(s)).  Studies/Results: Ct Abdomen Pelvis Wo Contrast  03/10/2012  *RADIOLOGY REPORT*  Clinical Data: Abdominal pain.  Rectal bleeding.  End-stage renal disease.  Diabetic.  Hypertension.  CT ABDOMEN AND PELVIS WITHOUT CONTRAST  Technique:  Multidetector CT imaging of the abdomen and pelvis was performed following the standard protocol without intravenous contrast.  Comparison: 07/26/2010 ultrasound of the kidneys.   09/21/2004 CT of the abdomen and pelvis.  Findings: Prior partial colectomy.  Sigmoid and descending colon diverticula.  Two diverticula with minimal haziness along the periphery and therefore very low-level inflammation not entirely excluded in the proper clinical setting. No free intraperitoneal air free fluid.  Marked calcification of all visualized arterial structures with ectasia of the abdominal aorta with focal bulge of the lower thoracic aorta measuring 2.7 cm maximal dimension.  Narrowing of vessels by the significant calcified plaque most prominent left renal artery.  Tortuous splenic artery without aneurysm noted.  Atrophic kidneys with multiple low density renal lesions most of which appear to be cysts although some too small to characterize.  Evaluation of solid abdominal viscera is limited by lack of IV contrast.  Taking this limitation into account no worrisome hepatic, splenic, pancreatic or adrenal lesion.  Small calcifications are noted within the liver and spleen.  Small hiatal hernia.  Pleural thickening lung bases partially calcified on the right.  Coronary artery calcifications and calcification mitral valve and aortic valve.  Schmorl's node deformity/superior plate compression fracture L3 appears remote.  Calcified uterine fibroids.  Decompressed urinary bladder.  No calcified gallstone.  IMPRESSION: Sigmoid and descending colon diverticula.  Two diverticula with minimal haziness along the periphery and therefore very low-level inflammation not entirely excluded in the proper clinical setting.  Atherosclerotic type changes as detailed above.  Small hiatal hernia.  Atrophic kidneys with renal cysts as detailed above.   Original Report Authenticated By: Lacy Duverney, M.D.    Ct Head Wo Contrast  03/10/2012  *RADIOLOGY REPORT*  Clinical Data: Unresponsive earlier today.  Confusion.  History of end-stage renal disease, dialysis, high blood pressure, diabetes and Parkinson's disease.  CT HEAD  WITHOUT CONTRAST  Technique:  Contiguous axial images were obtained from the base of the skull through the vertex without contrast.  Comparison: 01/04/2012.  Findings: No intracranial hemorrhage.  Remote infarcts involving the basal ganglia bilaterally.  There are prominent small vessel disease type changes.  No CT evidence of large acute infarct.  Small infarct would be difficult to exclude in this setting.  Global atrophy without hydrocephalus.  Prominent vascular calcifications.  Partially empty sella.  No intracranial mass lesion detected on this unenhanced exam.  IMPRESSION: No intracranial hemorrhage or CT findings of large acute infarct.  Remote infarcts and prominent small vessel disease type changes.  Atrophy.   Original Report Authenticated By: Lacy Duverney, M.D.    Dg Chest Port 1 View  03/09/2012  *RADIOLOGY REPORT*  Clinical Data: Shortness of breath.  PORTABLE CHEST - 1 VIEW  Comparison: 02/27/2012  Findings: Again noted is a right jugular dialysis catheter. Catheter tip is in the lower SVC region.  Lungs are clear without airspace disease or edema.  The thoracic aorta is heavily calcified.  Heart size is within normal limits.  IMPRESSION: No acute chest findings.  Dialysis catheter.   Original Report Authenticated By: Richarda Overlie, M.D.     Medications:  Scheduled:   . insulin  aspart  0-9 Units Subcutaneous Q4H  . metoprolol  5 mg Intravenous Q6H  . pantoprazole (PROTONIX) IV  40 mg Intravenous Q12H  . polyethylene glycol  4,000 mL Oral Once  . sodium chloride  3 mL Intravenous Q12H  . sodium chloride  3 mL Intravenous Q12H   Continuous:  OZH:YQMVHQ chloride, acetaminophen, acetaminophen, albuterol, ondansetron (ZOFRAN) IV, ondansetron, sodium chloride  Assessment: Principal Problem:  *Hematochezia Active Problems:  DM  HYPERTENSION  Chronic systolic heart failure  ESRD  Peripheral vascular disease, unspecified  Skin ulcer of great toe  Decubitus ulcer of heel, stage 1  Acute  encephalopathy  Atrial fibrillation  Macrocytic anemia  Parkinson's disease   1. Hematochezia/GI bleed. Etiology is unknown, but the CT scan is noted for diverticulosis. Per history obtained by GI, the patient had a cecal polyp and a polypoid mass in 2006. We'll continue current treatment, including PPI. We'll continue to monitor her. Diagnostic workup per GI.  Anemia, macrocytic secondary to chronic anemia of renal disease and acute blood loss anemia. No indication for transfusion currently. Anemia panel pending.  Reported atrial fibrillation. It is unclear whether this is chronic or new, but it is likely paroxysmal given the history. Cardiology has been consulted. 2-D echocardiogram has been ordered. TSH is pending. Her rate is controlled. Will restart carvedilol when she is taking by mouth. Apparently she is not on an ACE inhibitor due to to end-stage renal disease.  Chronic systolic congestive heart failure. This appears to be stable.  Hypertension. Currently stable. Will restart carvedilol.  Acute encephalopathy. CT scan of her head reveals old infarcts, but no acute findings. She has underlying dementia, and it is likely that her acute encephalopathy is in part secondary to it. She was also reported to have hypoxia and hypotension prior to this admission which could have contributed to her altered mental status. She also takes hydrocodone which could also be a contributor. Currently, she is alert and fairly oriented. There are no focal neurological findings. TSH and vitamin B12 levels are pending.  Stage I left heel ulcer/skin ulcer on great toe. Continue wound care.  Diabetes mellitus. Currently controlled on sliding-scale NovoLog. She is n.p.o. for possible endoscopic study. I see no reason to hold her diet until speech therapy sees her. Following the endoscopic procedure, would favor clear liquids and await recommendations by speech therapy.  End-stage renal disease. Nephrology has  been consulted. Hemodialysis per Dr. Kristian Covey.  Right tibial-fibular fracture. We'll continue cast. She has an appointment to followup with orthopedics later this week. She is nonweightbearing on that leg.  Plan:  1. Will advance diet to clear liquids or full liquids following possible endoscopic study. Her caretaker reports no evidence of difficulty swallowing at home. Will await speech therapy consultation. 2. Restart most of her oral chronic medications. 3. Check the results of the TSH, anemia panel, and 2-D echocardiogram pending.     LOS: 1 day   Donnika Kucher 03/10/2012, 10:08 AM

## 2012-03-10 NOTE — Consult Note (Signed)
CARDIOLOGY CONSULT NOTE  Patient ID: Katrina Meyer MRN: [id number] DOB/AGE: [birth year] 77 y.o.  Admit date: 03/09/2012 Referring Physician: PTH-Fisher Primary PhysicianVYAS,DHRUV B., MD Primary Cardiologist: (New) Daleen Squibb, Ahni Bradwell MD Reason for Consultation: New onset atrial fibrillation  Principal Problem:  *Hematochezia Active Problems:  DM  HYPERTENSION  Chronic systolic heart failure  ESRD  Peripheral vascular disease, unspecified  Skin ulcer of great toe  Decubitus ulcer of heel, stage 1  Acute encephalopathy  Atrial fibrillation  Macrocytic anemia  Parkinson's disease  HPI: Katrina Meyer is a 79 y/o patient with multiple medical issues we are asked to see for 79 y/o new onset atrial fibrillation. She was admitted after becoming unresponsive, hypotensive, with noted rectal bleeding during dialysis, with desaturation to 77%. She was revived with Trendelenburg position and brought to ER via EMS. On arrival BP was 148/81 with EKG demonstrating atrial fibrillation HR 113 bpm. Hgb 10.9 Hct 36.0, Plts 262. She was found to be hypokalemic with K+ of 3.3. The patient has dementia, caregiver at bedside is not familiar with her history. History is obtained from current and previous medical records. Echocardiogram has been ordered.     She has a lengthy history to include ESRD, on dialysis MWF, Parkinson's disease, systolic dysfunction, but no echo available to review, diabetes, hypertension, PAD with evaluation per [facility] in 2012 with recommendation for possible BKA of the right leg at that time, she has subsequently had stents to the right popliteal artery, (aortagram with run off revealed widely patent stent, but disease in the anterior tibial and peroneal artery not amendable to intervention) anemia  and hypothyroidism. She was recently admitted and discharged from [facility] hospital 3 weeks ago due to non-healing ulcer of the right great toe and fx of right proximal tibia and fibula seen by Dr. Otelia Sergeant with  casting of the right leg.    She is being seen by GI and a planned colonoscopy is scheduled for today. She has now converted to NSR within the last 45 minutes.   Review of systems complete and found to be negative unless listed above   Past Medical History  Diagnosis Date  . Hyperlipidemia   . Hypertension   . Diabetes mellitus   . DVT (deep venous thrombosis)   . End stage renal disease on dialysis   . Stroke   . Dementia   . Parkinson's disease   . Tibia/fibula fracture 01/04/2012    Treated with long leg casting. Cast change in hospital 02/27/12   . Skin ulcer of great toe 02/27/2012    Right great toe   . Hematochezia 03/09/2012  . Atrial fibrillation 03/09/2012    Seen on older EKG as well but family not aware.   . Macrocytic anemia 03/09/2012    Family History  Problem Relation Age of Onset  . Stroke Mother   . Diabetes Mother   . Cancer Mother   . Hypertension Mother   . Coronary artery disease Father   . Diabetes Sister     History   Social History  . Marital Status: Married    Spouse Name: N/A    Number of Children: N/A  . Years of Education: N/A   Occupational History  . Not on file.   Social History Main Topics  . Smoking status: Passive Smoke Exposure - Never Smoker  . Smokeless tobacco: Never Used  . Alcohol Use: No  . Drug Use: No  . Sexually Active: Not on file   Other Topics Concern  . Not  on file   Social History Narrative  . No narrative on file    Past Surgical History  Procedure Date  . Dg av dialysis graft declot or   . Av fistula repair Aug. 2013    Left arm  . Cholecystectomy   . Colon surgery     Aprrox 2006 at [facility] after she had a colonoscopy     Prescriptions prior to admission  Medication Sig Dispense Refill  . aspirin EC 325 MG tablet Take 325 mg by mouth daily.      Marland Kitchen atorvastatin (LIPITOR) 10 MG tablet Take 1 tablet by mouth Daily.      . calcium acetate, Phos Binder, (PHOSLYRA) 667 MG/5ML SOLN Take 667-1,334 mg by  mouth 3 (three) times daily with meals. Take 10 milliliters (ML) (1334mg  total)  with meals and take 5 milliliters (ML) (667mg  total) with snacks      . carbidopa-levodopa (SINEMET IR) 25-100 MG per tablet Take 1.5 tablets by mouth 3 (three) times daily.       . carvedilol (COREG) 6.25 MG tablet Take 1 tablet (6.25 mg total) by mouth 2 (two) times daily with a meal.  60 tablet  0  . donepezil (ARICEPT) 10 MG tablet Take 1 tablet by mouth Daily.      . fexofenadine (ALLEGRA) 180 MG tablet Take 180 mg by mouth daily.      Marland Kitchen FLUoxetine (PROZAC) 40 MG capsule Take 1 capsule by mouth daily.       Marland Kitchen HYDROcodone-acetaminophen (NORCO/VICODIN) 5-325 MG per tablet Take 1 tablet by mouth every 6 (six) hours as needed for pain.  30 tablet  0  . [EXPIRED] HYDROcodone-acetaminophen (NORCO/VICODIN) 5-325 MG per tablet Take 1-2 tablets by mouth every 6 (six) hours as needed. For pain      . insulin glargine (LANTUS) 100 UNIT/ML injection Inject 5 Units into the skin at bedtime.       . insulin lispro (HUMALOG) 100 UNIT/ML injection Inject into the skin 3 (three) times daily before meals. As directed per sliding scale      . levothyroxine (SYNTHROID, LEVOTHROID) 25 MCG tablet Take 25 mcg by mouth every morning.      . loperamide (IMODIUM) 2 MG capsule Take 2 mg by mouth daily as needed. For anti-diarrheal      . multivitamin (RENA-VIT) TABS tablet Take 1 tablet by mouth daily.      . cephALEXin (KEFLEX) 250 MG capsule Take 1 capsule (250 mg total) by mouth every 12 (twelve) hours.  14 capsule  0    Physical Exam: Blood pressure 140/56, pulse 66, temperature 98.1 F (36.7 C), temperature source Oral, resp. rate 18, height 5\' 7"  (1.702 m), weight 174 lb (78.926 kg), SpO2 100.00%.  General: Well developed, well nourished, in no acute distress Head: Eyes PERRLA, No xanthomas.   Normal cephalic and atramatic  Lungs: Clear bilaterally to auscultation and percussion. Heart: HRRR S1 S2, soft 1/6 systolic murmur.    Pulses are 2+ & equal radially            No carotid bruit. No JVD.  No abdominal bruits. No femoral bruits. Abdomen: Bowel sounds are positive,hyperactive abdomen soft with mild enderness without masses or                  Hernia's noted. Msk:  Back normal, normal strength and tone for age. Extremities: No clubbing, cyanosis or edema. Cast to right leg, pulse decreased in left leg.Jesse Sans. Miyana Mordecai,  MD, [facility] Pager:  [phone]  decreased in the left leg.  Neuro: Alert and cooperative, but mildly confused.  Psych:  Good affect, responds appropriately    Labs:   Lab Results  Component Value Date   WBC 8.0 03/10/2012   HGB 9.7* 03/10/2012   HCT 32.5* 03/10/2012   MCV 103.2* 03/10/2012   PLT 239 03/10/2012    Lab 03/10/12 0502  NA 141  K 3.8  CL 100  CO2 31  BUN 19  CREATININE 4.23*  CALCIUM 9.4  PROT 5.4*  BILITOT 0.3  ALKPHOS 118*  ALT <5  AST 14  GLUCOSE 119*       Radiology: Ct Abdomen Pelvis Wo Contrast  03/10/2012  *RADIOLOGY REPORT*  Clinical Data: Abdominal pain.  Rectal bleeding.  End-stage renal disease.  Diabetic.  Hypertension.  CT ABDOMEN AND PELVIS WITHOUT CONTRAST   IMPRESSION: Sigmoid and descending colon diverticula.  Two diverticula with minimal haziness along the periphery and therefore very low-level inflammation not entirely excluded in the proper clinical setting.  Atherosclerotic type changes as detailed above.  Small hiatal hernia.  Atrophic kidneys with renal cysts as detailed above.   Original Report Authenticated By: Lacy Duverney, M.D.    Ct Head Wo Contrast  03/10/2012  *RADIOLOGY REPORT*  Clinical Data: Unresponsive earlier today.  Confusion.  History of end-stage renal disease, dialysis, high blood pressure, diabetes and Parkinson's disease.  CT HEAD WITHOUT CONTRAST  Technique:  Contiguous axial images were obtained from the base of the skull through the vertex without contrast.  Comparison: 01/04/2012.  Findings: No intracranial  hemorrhage.  Remote infarcts involving the basal ganglia bilaterally.  There are prominent small vessel disease type changes.  No CT evidence of large acute infarct.  Small infarct would be difficult to exclude in this setting.  Global atrophy without hydrocephalus.  Prominent vascular calcifications.  Partially empty sella.  No intracranial mass lesion detected on this unenhanced exam.  IMPRESSION: No intracranial hemorrhage or CT findings of large acute infarct.  Remote infarcts and prominent small vessel disease type changes.  Atrophy.   Original Report Authenticated By: Lacy Duverney, M.D.    Dg Chest Port 1 View  03/09/2012  *RADIOLOGY REPORT*  Clinical Data: Shortness of breath.  PORTABLE CHEST - 1 VIEW  Comparison: 02/27/2012  Findings: Again noted is a right jugular dialysis catheter. Catheter tip is in the lower SVC region.  Lungs are clear without airspace disease or edema.  The thoracic aorta is heavily calcified.  Heart size is within normal limits.  IMPRESSION: No acute chest findings.  Dialysis catheter.   Original Report Authenticated By: Richarda Overlie, M.D.    YNW:GNFAOZ fib with rate of 113 bpm. Telemetry shows conversion to NSR with PAC's at 10:40 am.   ASSESSMENT AND PLAN:   1. New Onset Atrial fibrillation with RVR: Now returned to NSR with occasional PAC's. Heart rates in the 70s. Likely related to physiologic stress, anemia, hypotension. Review of old records in E-Chart does not reveal prior history of Atrial fib. She has been seen at [facility] in the past, and may have been seen by cardiology there. Records have been requested.  CHADs Score 5 (Age, hypertension, diabetes and CVA), however she has had some hematochezia with evidence of anemia on labs. Will await colonoscopy before making definitive recommendation for anticoagulation at this time. She is at high risk for future CVA. Continue rate control.Change IV metoprolol to 25 mg BID PO and monitor response after able  to take in  PO.  2. Hx of Systolic Dysfunction: No records available to confirm. Echocardiogram is ordered. Will await results to ascertain best medical therapy.  3. Hypertension: Moderate control only. Will await GI work up for active bleeding before adding in another antihypertensive to avoid hypotension. For now, continue IV metoprolol.   4. ESRD 5. GI Bleed 6. Parkinson's disease 7. Tibia/fibula fracture of the right leg 8. Dementia 9. Macrocytic anemia 10. PAD   .    Signed: Bettey Mare. Lyman Bishop NP [facility] 03/10/2012, 10:18 AM Co-Sign MD I have taken a history, reviewed medications, allergies, PMH, SH, FH, and reviewed ROS and examined the patient.  I agree with the assessment and plan. Patient currently in NSR. No anticoagulation till cleared by GI. Discussed with family at bedside. Echo pending.  Kimora Stankovic C. Daleen Squibb, MD, [facility] Pager:  [phone]

## 2012-03-10 NOTE — Consult Note (Signed)
Reason for Consult:Positive hemocult Referring Physician: Hospitalist services.  Katrina Meyer is an 79 y.o. female.  HPI: Admitted yesterday from the ED. She apparently was at dialysis and became hypotensive and hypoxia. From ED notes, she was also noted to have had a bloody stool. On exam in the ED, noted to have melena and guaiac positive. History obtained from records. Patient is not able to give a hx. Occasinally has diarrhea. No nausea.  She has a hx of ESRD and has dialysis on M-W-F. Caregiver is really not able to give an accurate history.  Recently d/c from [facility] for a fx rt tib/fib.   Her last colonoscopy was in  February of 2006 by Dr. Cleotis Nipper. 4-5 mm polyp in the cecum which was removed. 2.3 to 3cm flat polypoid mass  In the ascending colon. Was biopsied.  Diverticulosis in the sigmoid colon .  Both polyps tubular adenomas.  Past Medical History  Diagnosis Date  . Hyperlipidemia   . Hypertension   . Diabetes mellitus   . DVT (deep venous thrombosis)   . End stage renal disease on dialysis   . Stroke   . Dementia   . Parkinson's disease   . Tibia/fibula fracture 01/04/2012    Treated with long leg casting. Cast change in hospital 02/27/12   . Skin ulcer of great toe 02/27/2012    Right great toe   . Hematochezia 03/09/2012  . Atrial fibrillation 03/09/2012    Seen on older EKG as well but family not aware.   . Macrocytic anemia 03/09/2012    Past Surgical History  Procedure Date  . Dg av dialysis graft declot or   . Av fistula repair Aug. 2013    Left arm  . Cholecystectomy   . Colon surgery     Aprrox 2006 at [facility] after she had a colonoscopy    Family History  Problem Relation Age of Onset  . Stroke Mother   . Diabetes Mother   . Cancer Mother   . Hypertension Mother   . Coronary artery disease Father   . Diabetes Sister     Social History:  reports that she has been passively smoking.  She has never used smokeless tobacco. She reports that she does not  drink alcohol or use illicit drugs.  Allergies: No Known Allergies  Medications: I have reviewed the patient's current medications.  Results for orders placed during the hospital encounter of 03/09/12 (from the past 48 hour(s))  OCCULT BLOOD, POC DEVICE     Status: Abnormal   Collection Time   03/09/12  4:24 PM      Component Value Range Comment   Fecal Occult Bld POSITIVE (*) NEGATIVE   BASIC METABOLIC PANEL     Status: Abnormal   Collection Time   03/09/12  4:42 PM      Component Value Range Comment   Sodium 143  135 - 145 mEq/L    Potassium 3.3 (*) 3.5 - 5.1 mEq/L    Chloride 99  96 - 112 mEq/L    CO2 34 (*) 19 - 32 mEq/L    Glucose, Bld 150 (*) 70 - 99 mg/dL    BUN 11  6 - 23 mg/dL    Creatinine, Ser 1.61 (*) 0.50 - 1.10 mg/dL    Calcium 9.4  8.4 - 09.6 mg/dL    GFR calc non Af Amer 12 (*) >90 mL/min    GFR calc Af Amer 14 (*) >90 mL/min   CBC WITH  DIFFERENTIAL     Status: Abnormal   Collection Time   03/09/12  4:42 PM      Component Value Range Comment   WBC 6.1  4.0 - 10.5 K/uL    RBC 3.49 (*) 3.87 - 5.11 MIL/uL    Hemoglobin 10.8 (*) 12.0 - 15.0 g/dL    HCT 16.1  09.6 - 04.5 %    MCV 103.2 (*) 78.0 - 100.0 fL    MCH 30.9  26.0 - 34.0 pg    MCHC 30.0  30.0 - 36.0 g/dL    RDW 40.9 (*) 81.1 - 15.5 %    Platelets 262  150 - 400 K/uL    Neutrophils Relative 74  43 - 77 %    Neutro Abs 4.5  1.7 - 7.7 K/uL    Lymphocytes Relative 17  12 - 46 %    Lymphs Abs 1.0  0.7 - 4.0 K/uL    Monocytes Relative 6  3 - 12 %    Monocytes Absolute 0.4  0.1 - 1.0 K/uL    Eosinophils Relative 2  0 - 5 %    Eosinophils Absolute 0.1  0.0 - 0.7 K/uL    Basophils Relative 0  0 - 1 %    Basophils Absolute 0.0  0.0 - 0.1 K/uL   PROTIME-INR     Status: Normal   Collection Time   03/09/12  4:42 PM      Component Value Range Comment   Prothrombin Time 14.0  11.6 - 15.2 seconds    INR 1.09  0.00 - 1.49   APTT     Status: Abnormal   Collection Time   03/09/12  4:42 PM      Component Value Range  Comment   aPTT 43 (*) 24 - 37 seconds   LACTIC ACID, PLASMA     Status: Normal   Collection Time   03/09/12  4:44 PM      Component Value Range Comment   Lactic Acid, Venous 1.8  0.5 - 2.2 mmol/L   TYPE AND SCREEN     Status: Normal   Collection Time   03/09/12  4:46 PM      Component Value Range Comment   ABO/RH(D) O POS      Antibody Screen NEG      Sample Expiration 03/12/2012     LIPASE, BLOOD     Status: Normal   Collection Time   03/09/12  8:04 PM      Component Value Range Comment   Lipase 37  11 - 59 U/L   RETICULOCYTES     Status: Abnormal   Collection Time   03/09/12  8:04 PM      Component Value Range Comment   Retic Ct Pct 4.5 (*) 0.4 - 3.1 %    RBC. 3.47 (*) 3.87 - 5.11 MIL/uL    Retic Count, Manual 156.2  19.0 - 186.0 K/uL   GLUCOSE, CAPILLARY     Status: Abnormal   Collection Time   03/09/12 11:17 PM      Component Value Range Comment   Glucose-Capillary 162 (*) 70 - 99 mg/dL   CBC     Status: Abnormal   Collection Time   03/10/12  5:02 AM      Component Value Range Comment   WBC 8.0  4.0 - 10.5 K/uL    RBC 3.15 (*) 3.87 - 5.11 MIL/uL    Hemoglobin 9.7 (*) 12.0 - 15.0 g/dL  HCT 32.5 (*) 36.0 - 46.0 %    MCV 103.2 (*) 78.0 - 100.0 fL    MCH 30.8  26.0 - 34.0 pg    MCHC 29.8 (*) 30.0 - 36.0 g/dL    RDW 16.1 (*) 09.6 - 15.5 %    Platelets 239  150 - 400 K/uL   COMPREHENSIVE METABOLIC PANEL     Status: Abnormal   Collection Time   03/10/12  5:02 AM      Component Value Range Comment   Sodium 141  135 - 145 mEq/L    Potassium 3.8  3.5 - 5.1 mEq/L    Chloride 100  96 - 112 mEq/L    CO2 31  19 - 32 mEq/L    Glucose, Bld 119 (*) 70 - 99 mg/dL    BUN 19  6 - 23 mg/dL    Creatinine, Ser 0.45 (*) 0.50 - 1.10 mg/dL    Calcium 9.4  8.4 - 40.9 mg/dL    Total Protein 5.4 (*) 6.0 - 8.3 g/dL    Albumin 2.7 (*) 3.5 - 5.2 g/dL    AST 14  0 - 37 U/L    ALT <5  0 - 35 U/L    Alkaline Phosphatase 118 (*) 39 - 117 U/L    Total Bilirubin 0.3  0.3 - 1.2 mg/dL    GFR calc non  Af Amer 9 (*) >90 mL/min    GFR calc Af Amer 11 (*) >90 mL/min     Ct Abdomen Pelvis Wo Contrast  03/10/2012  *RADIOLOGY REPORT*  Clinical Data: Abdominal pain.  Rectal bleeding.  End-stage renal disease.  Diabetic.  Hypertension.  CT ABDOMEN AND PELVIS WITHOUT CONTRAST  Technique:  Multidetector CT imaging of the abdomen and pelvis was performed following the standard protocol without intravenous contrast.  Comparison: 07/26/2010 ultrasound of the kidneys.  09/21/2004 CT of the abdomen and pelvis.  Findings: Prior partial colectomy.  Sigmoid and descending colon diverticula.  Two diverticula with minimal haziness along the periphery and therefore very low-level inflammation not entirely excluded in the proper clinical setting. No free intraperitoneal air free fluid.  Marked calcification of all visualized arterial structures with ectasia of the abdominal aorta with focal bulge of the lower thoracic aorta measuring 2.7 cm maximal dimension.  Narrowing of vessels by the significant calcified plaque most prominent left renal artery.  Tortuous splenic artery without aneurysm noted.  Atrophic kidneys with multiple low density renal lesions most of which appear to be cysts although some too small to characterize.  Evaluation of solid abdominal viscera is limited by lack of IV contrast.  Taking this limitation into account no worrisome hepatic, splenic, pancreatic or adrenal lesion.  Small calcifications are noted within the liver and spleen.  Small hiatal hernia.  Pleural thickening lung bases partially calcified on the right.  Coronary artery calcifications and calcification mitral valve and aortic valve.  Schmorl's node deformity/superior plate compression fracture L3 appears remote.  Calcified uterine fibroids.  Decompressed urinary bladder.  No calcified gallstone.  IMPRESSION: Sigmoid and descending colon diverticula.  Two diverticula with minimal haziness along the periphery and therefore very low-level  inflammation not entirely excluded in the proper clinical setting.  Atherosclerotic type changes as detailed above.  Small hiatal hernia.  Atrophic kidneys with renal cysts as detailed above.   Original Report Authenticated By: Lacy Duverney, M.D.    Ct Head Wo Contrast  03/10/2012  *RADIOLOGY REPORT*  Clinical Data: Unresponsive earlier today.  Confusion.  History of end-stage renal disease, dialysis, high blood pressure, diabetes and Parkinson's disease.  CT HEAD WITHOUT CONTRAST  Technique:  Contiguous axial images were obtained from the base of the skull through the vertex without contrast.  Comparison: 01/04/2012.  Findings: No intracranial hemorrhage.  Remote infarcts involving the basal ganglia bilaterally.  There are prominent small vessel disease type changes.  No CT evidence of large acute infarct.  Small infarct would be difficult to exclude in this setting.  Global atrophy without hydrocephalus.  Prominent vascular calcifications.  Partially empty sella.  No intracranial mass lesion detected on this unenhanced exam.  IMPRESSION: No intracranial hemorrhage or CT findings of large acute infarct.  Remote infarcts and prominent small vessel disease type changes.  Atrophy.   Original Report Authenticated By: Lacy Duverney, M.D.    Dg Chest Port 1 View  03/09/2012  *RADIOLOGY REPORT*  Clinical Data: Shortness of breath.  PORTABLE CHEST - 1 VIEW  Comparison: 02/27/2012  Findings: Again noted is a right jugular dialysis catheter. Catheter tip is in the lower SVC region.  Lungs are clear without airspace disease or edema.  The thoracic aorta is heavily calcified.  Heart size is within normal limits.  IMPRESSION: No acute chest findings.  Dialysis catheter.   Original Report Authenticated By: Richarda Overlie, M.D.     ROS Blood pressure 140/56, pulse 66, temperature 98.1 F (36.7 C), temperature source Oral, resp. rate 18, height 5\' 7"  (1.702 m), weight 174 lb (78.926 kg), SpO2 100.00%. Physical Exam  Patient  unable to provide any information. . Skin warm and dry. Oral mucosa is moist.   . Sclera anicteric, conjunctivae is pink. Thyroid not enlarged. No cervical lymphadenopathy. Lungs clear. Heart: irregular rate and rhythm. AV fistula to left arm.  Abdomen is soft. Bowel sounds are positive. No hepatomegaly. No abdominal masses felt. No tenderness.  Cast to rt leg just above knee. Dressing to left foot.  Rectal exam revealed marooned colored blood.   Assessment/Plan:  GI bleed, possible diverticular. No abdominal pain associated with her symptoms.  Noted to have marooned color blood from rectum. Hemoglobin has dropped slightly. Plan: Will get records [facility] concerning her last colonoscopy. I will discuss with Dr. Karilyn Cota.    SETZER,TERRI W 03/10/2012, 8:18 AM    GI attending,s note. Patient interviewed and examined earlier today and condition discussed with her daughter Ms. Nashia Remus over the phone. Colonoscopy records from 2006 reviewed. She had 2 tubular adenomas removed. She also had mild sigmoid colon diverticulosis. Suspect GI bleed from colonic source. If colonoscopy is negative would consider EGD given that she is on full dose aspirin.

## 2012-03-10 NOTE — Discharge Summary (Signed)
Patient H&P reviewed with Vernon PA-C.  

## 2012-03-10 NOTE — Progress Notes (Signed)
UR Chart Review Completed  

## 2012-03-10 NOTE — Progress Notes (Signed)
*  PRELIMINARY RESULTS* Echocardiogram 2D Echocardiogram has been performed.  Conrad Cloverdale 03/10/2012, 11:50 AM

## 2012-03-11 ENCOUNTER — Encounter (HOSPITAL_COMMUNITY): Payer: Self-pay | Admitting: Pharmacy Technician

## 2012-03-11 ENCOUNTER — Encounter (HOSPITAL_COMMUNITY): Payer: Self-pay | Admitting: Internal Medicine

## 2012-03-11 LAB — BASIC METABOLIC PANEL
Calcium: 9.9 mg/dL (ref 8.4–10.5)
Creatinine, Ser: 5.33 mg/dL — ABNORMAL HIGH (ref 0.50–1.10)
GFR calc Af Amer: 8 mL/min — ABNORMAL LOW (ref >90)
GFR calc non Af Amer: 7 mL/min — ABNORMAL LOW (ref >90)

## 2012-03-11 LAB — GLUCOSE, CAPILLARY
Glucose-Capillary: 125 mg/dL — ABNORMAL HIGH (ref 70–99)
Glucose-Capillary: 59 mg/dL — ABNORMAL LOW (ref 70–99)
Glucose-Capillary: 114 mg/dL — ABNORMAL HIGH (ref 70–99)
Glucose-Capillary: 175 mg/dL — ABNORMAL HIGH (ref 70–99)

## 2012-03-11 LAB — CBC
MCV: 102.1 fL — ABNORMAL HIGH (ref 78.0–100.0)
Platelets: 238 10*3/uL (ref 150–400)
RDW: 18.4 % — ABNORMAL HIGH (ref 11.5–15.5)
WBC: 8.6 10*3/uL (ref 4.0–10.5)

## 2012-03-11 LAB — PHOSPHORUS: Phosphorus: 4.4 mg/dL (ref 2.3–4.6)

## 2012-03-11 MED ORDER — LIDOCAINE-PRILOCAINE 2.5-2.5 % EX CREA: 1.0000 "application " | TOPICAL_CREAM | CUTANEOUS | Status: DC | PRN

## 2012-03-11 MED ORDER — INSULIN GLARGINE 100 UNIT/ML ~~LOC~~ SOLN: 10.0000 [IU] | Freq: Every day | SUBCUTANEOUS | Status: DC

## 2012-03-11 MED ORDER — PANTOPRAZOLE SODIUM 40 MG PO TBEC
40.0000 mg | DELAYED_RELEASE_TABLET | Freq: Two times a day (BID) | ORAL | Status: DC
  Administered 2012-03-11 – 2012-03-12 (×3): 40 mg via ORAL
  Filled 2012-03-11 (×2): qty 1

## 2012-03-11 MED ORDER — ALTEPLASE 2 MG IJ SOLR
2.0000 mg | Freq: Once | INTRAMUSCULAR | Status: AC | PRN
  Filled 2012-03-11: qty 2

## 2012-03-11 MED ORDER — LIDOCAINE HCL (PF) 1 % IJ SOLN: 5.0000 mL | INTRAMUSCULAR | Status: DC | PRN

## 2012-03-11 MED ORDER — NEPRO/CARBSTEADY PO LIQD: 237.0000 mL | ORAL | Status: DC | PRN

## 2012-03-11 MED ORDER — GLUCOSE 40 % PO GEL
ORAL | Status: AC
  Administered 2012-03-11: 07:00:00
  Filled 2012-03-11: qty 0.83

## 2012-03-11 MED ORDER — CARVEDILOL 12.5 MG PO TABS
12.5000 mg | ORAL_TABLET | Freq: Two times a day (BID) | ORAL | Status: DC
  Administered 2012-03-11 – 2012-03-12 (×2): 12.5 mg via ORAL
  Filled 2012-03-11 (×2): qty 1

## 2012-03-11 MED ORDER — INSULIN ASPART 100 UNIT/ML ~~LOC~~ SOLN
20.0000 [IU] | Freq: Once | SUBCUTANEOUS | Status: AC
  Administered 2012-03-11: 20 [IU] via SUBCUTANEOUS

## 2012-03-11 MED ORDER — HEPARIN SODIUM (PORCINE) 1000 UNIT/ML DIALYSIS
1000.0000 [IU] | INTRAMUSCULAR | Status: DC | PRN
  Filled 2012-03-11: qty 1

## 2012-03-11 MED ORDER — PENTAFLUOROPROP-TETRAFLUOROETH EX AERO
1.0000 "application " | INHALATION_SPRAY | CUTANEOUS | Status: DC | PRN
  Filled 2012-03-11: qty 103.5

## 2012-03-11 MED ORDER — HYDRALAZINE HCL 20 MG/ML IJ SOLN
10.0000 mg | INTRAMUSCULAR | Status: DC | PRN
  Filled 2012-03-11 (×2): qty 1

## 2012-03-11 MED ORDER — POTASSIUM CHLORIDE CRYS ER 20 MEQ PO TBCR
40.0000 meq | EXTENDED_RELEASE_TABLET | Freq: Once | ORAL | Status: AC
  Administered 2012-03-11: 40 meq via ORAL
  Filled 2012-03-11: qty 1

## 2012-03-11 MED ORDER — INSULIN ASPART 100 UNIT/ML ~~LOC~~ SOLN: 0.0000 [IU] | Freq: Every day | SUBCUTANEOUS | Status: DC

## 2012-03-11 MED ORDER — INSULIN ASPART 100 UNIT/ML ~~LOC~~ SOLN
0.0000 [IU] | Freq: Three times a day (TID) | SUBCUTANEOUS | Status: DC
  Administered 2012-03-12: 5 [IU] via SUBCUTANEOUS

## 2012-03-11 NOTE — Procedures (Signed)
4 hour hemodialysis treatment completed through right chest catheter.  Goal met; tolerated removal of 2.2 liters with no interruption in ultrafiltration.  All blood was reinfused.  Outpatient plan of care requested from Elinor Dodge, MSW @ [facility] [phone].

## 2012-03-11 NOTE — Progress Notes (Addendum)
SUBJECTIVE:Sleepy, lethargic. Opens eyes to verbal stimuli   *Hematochezia  DM  HYPERTENSION  Chronic systolic heart failure  ESRD  Peripheral vascular disease, unspecified  Skin ulcer of great toe  Decubitus ulcer of heel, stage 1  Acute encephalopathy  Atrial fibrillation  Macrocytic anemia  Parkinson's disease  Diverticulosis  Basic Metabolic Panel:  Basename 03/11/12 0501 03/11/12 0031 03/10/12 0502  NA 138 -- 141  K 3.3* -- 3.8  CL 95* -- 100  CO2 32 -- 31  GLUCOSE 58* 372* --  BUN 25* -- 19  CREATININE 5.33* -- 4.23*  CALCIUM 9.9 -- 9.4  MG -- -- --  PHOS 4.4 -- --   Liver Function Tests:  [facility] 03/10/12 0502  AST 14  ALT <5  ALKPHOS 118*  BILITOT 0.3  PROT 5.4*  ALBUMIN 2.7*    Basename 03/09/12 2004  LIPASE 37  AMYLASE --   CBC:  Basename 03/11/12 0501 03/10/12 1742 03/09/12 1642  WBC 8.6 8.2 --  NEUTROABS -- -- 4.5  HGB 9.0* 9.3* --  HCT 29.5* 31.2* --  MCV 102.1* 102.0* --  PLT 238 236 --  Hemoglobin A1C:  Basename 03/09/12 2004  HGBA1C 6.6*    Basename 03/09/12 2004  TSH 1.978  T4TOTAL --  T3FREE --  THYROIDAB --  RADIOLOGY: Ct Abdomen Pelvis Wo Contrast 03/10/2012  Sigmoid and descending colon diverticula.  Two diverticula with minimal haziness along the periphery and therefore very low-level inflammation not entirely excluded in the proper clinical setting.  Atherosclerotic type changes as detailed above.  Small hiatal hernia.  Atrophic kidneys with renal cysts as detailed above.  Dg Chest Port 1 View  03/09/2012  Findings: Again noted is a right jugular dialysis catheter. Catheter tip is in the lower SVC region.  Lungs are clear without airspace disease or edema.  The thoracic aorta is heavily calcified.  Heart size is within normal limits.    Echocardiogram: 03/10/2012 Left ventricle: The cavity size was normal. Wall thickness was increased in a pattern of severe LVH. Systolic function was normal. The estimated ejection fraction  was in the range of 55% to 60%. Wall motion was normal; there were no regional wall motion abnormalities. - Aortic valve: Mild regurgitation. - Mitral valve: Calcified annulus. - Pulmonary arteries: Systolic pressure was mildly to moderately increased. PA peak pressure: 49mm Hg (S). - Pericardium, extracardiac: A trivial pericardial effusion was identified.  PHYSICAL EXAM BP 176/72  Pulse 60  Temp 98 F (36.7 C) (Oral)  Resp 18  Ht 5\' 7"  (1.702 m)  Wt 174 lb (78.926 kg)  BMI 27.25 kg/m2  SpO2 100% General: Well developed, well nourished, in no acute distress Head: Eyes PERRLA, No xanthomas.   Normal cephalic and atramatic  Lungs: Clear bilaterally to auscultation and percussion. Heart: HRRR S1 S2, occasional irregular systole. No RG; grade 1-2/6 basilar systolic ejection murmur with radiation to the carotids versus modest carotid bruits; no JVD  Pulses are 2+ & equal.  No abdominal bruits. No femoral bruits. Abdomen: Bowel sounds are positive, abdomen soft and non-tender without masses Msk:  Back normal, normal gait. Normal strength and tone for age. Extremities: No clubbing, cyanosis or edema. Right leg in cast. Some foot drop noted on the left.  DP +1 Neuro: Alert and oriented X 3. Psych:  Good affect, responds appropriately  TELEMETRY: Reviewed telemetry pt in: NSR with PAC's  ASSESSMENT AND PLAN:  1. Atrial fib: Remains in NSR. Afib likely related to physiologic stress, anemia and  hypotension. CHADS score of 5, but has had rectal bleeding from ulcerated polyps noted on EGD. Recommendations by GI not to start any type of anticoagulation for a least a week.   2. Hx of Systolic Dysfunction: Echo this admission demonstrates normal EF of 55% with severe LVH but no WMA. Continue coreg but will increase the dose to 12.5mg  BID in the setting of elevated BP.  3. Hypertension: On hydralazine prn, with coreg as above. Will follow response with adjusted doses for hypotension.   4.  Hypokalemia  5.GIB: S/P EGD with six polyps snared including large pedunculated polyp over 30 mm in the sigmoid colon with ulcerated surface. She possibly bled from this polyp. Single Hemoclip applied to polypectomy site per Dr. Patty Sermons note. No anticoagulation for a least one week is recommended.  6. Dementia 7.ESRD with encephalpathy 8. Parkinson's disease 9. Tibia/fibial fracture of the right leg 10. PAD 11. Macrocytic anemia  Bettey Mare. Lyman Bishop NP [facility] 03/11/2012, 8:59 AM  Cardiology Attending Patient interviewed and examined. Discussed with Joni Reining, NP.  Above note annotated and modified based upon my findings.  Patient has hypertensive heart disease with paroxysmal atrial fibrillation, which is now resolved. Bleeding risk is full anticoagulation utilized would be high, even if acute bleeding sites were allowed to heal.  Due to brevity of episode of atrial fibrillation and occurrence in the setting of physiologic stress, cost benefit analysis favors non-use of anticoagulation.  Hypertension initially appeared to be controlled, but systolics have been quite high over the past 18 hours. Additional medical therapy will likely be required at the discretion of patient's nephrologist.  [place] Bing, MD 03/11/2012, 4:50 PM

## 2012-03-11 NOTE — Care Management Note (Signed)
    Page 1 of 1   03/12/2012     11:39:07 AM   CARE MANAGEMENT NOTE 03/12/2012  Patient:  Katrina Meyer, Katrina Meyer   Account Number:  [id number]  Date Initiated:  03/11/2012  Documentation initiated by:  Rosemary Holms  Subjective/Objective Assessment:   Pt lives at home with caregivers around the clock. Daughter very involved with mother's care as well as fathers. Currently has HH RN with [facility] for ankle ulcer care. Per daughter dialysis fistula is supposed to be replaced on Friday     Action/Plan:   in [place].   Anticipated DC Date:  03/12/2012   Anticipated DC Plan:  HOME W HOME HEALTH SERVICES      DC Planning Services  CM consult      Choice offered to / List presented to:          [facility] arranged  HH-1 RN      [facility] agency  [facility]   Status of service:  Completed, signed off Medicare Important Message given?   (If response is "NO", the following Medicare IM given date fields will be blank) Date Medicare IM given:   Date Additional Medicare IM given:    Discharge Disposition:  HOME W HOME HEALTH SERVICES  Per UR Regulation:    If discussed at Long Length of Stay Meetings, dates discussed:    Comments:  03/12/12 Rosemary Holms RN BSN CM [facility]'s representative Tamala Bari notified of DC  03/11/12 Rosemary Holms RN BSN CM

## 2012-03-11 NOTE — Progress Notes (Signed)
[facility] PROGRESS NOTE  Katrina Meyer ZOX:[id number] DOB: 13-Feb-1932 DOA: 03/09/2012 PCP: Ignatius Specking., MD  Assessment/Plan: 1. Altered mental status. Patient is lethargic today. Could possibly be related to low blood sugar. She did have an episode where her blood sugar dropped to 27 this morning. Her Lantus was discontinued. We'll continue to follow blood sugars today. 2. Lower GI bleeding. Status post her polypectomy. No further evidence of bleeding. Hemoglobin is stable. 3. Atrial fibrillation. Currently in sinus rhythm. No anticoagulation for at least a week. Continue rate control medication. Her 2-D echocardiogram shows preserved ejection fraction. 4. End-stage renal disease on hemodialysis. Patient will get dialyzed today per schedule. 5. Chronic systolic congestive heart failure. This appears to be stable. 6. Diabetes mellitus. Patient has had fluctuations with severe hyperglycemia and hypoglycemia. Her Lantus has been discontinued. We'll continue to follow blood sugars today. 7. Right tubular/fibular fracture. Patient to followup with orthopedics for further management.  Code Status: Full code Family Communication: daughter will be coming in today, will discuss with her Disposition Plan: likely discharge home tomorrow.   Consultants:  Gastroenterology, Dr. Karilyn Cota  Nephrology, Dr. Kristian Covey  Cardiology, Corinda Gubler  Procedures: Colonoscopy with snare polypectomy and Hemoclip application on 03/10/12 by Dr. Karilyn Cota   Antibiotics:  HPI/Subjective: Patient is drowsy today.  She did have episodes of low blood sugar this morning. No further bleeding noted.  Objective: Filed Vitals:   03/10/12 1800 03/10/12 2207 03/11/12 0039 03/11/12 0417  BP: 151/82 204/69 160/71 176/72  Pulse:  51 58 60  Temp:  97.4 F (36.3 C)  98 F (36.7 C)  TempSrc:  Oral  Oral  Resp:  18  18  Height:      Weight:      SpO2:  100%  100%    Intake/Output Summary (Last 24 hours) at 03/11/12  1134 Last data filed at 03/10/12 2208  Gross per 24 hour  Intake    480 ml  Output      0 ml  Net    480 ml   Filed Weights   03/09/12 1617 03/09/12 2002 03/10/12 0531  Weight: 77.111 kg (170 lb) 73.211 kg (161 lb 6.4 oz) 78.926 kg (174 lb)    Exam:   General:  Drowsy, opens eyes to voice, slow to respond  Cardiovascular: S1, s2, RRR  Respiratory: CTA B  Abdomen: soft, NT, ND, BS+  Data Reviewed: Basic Metabolic Panel:  Lab 03/11/12 8119 03/11/12 0031 03/10/12 0502 03/09/12 1642  NA 138 -- 141 143  K 3.3* -- 3.8 3.3*  CL 95* -- 100 99  CO2 32 -- 31 34*  GLUCOSE 58* 372* 119* 150*  BUN 25* -- 19 11  CREATININE 5.33* -- 4.23* 3.32*  CALCIUM 9.9 -- 9.4 9.4  MG -- -- -- --  PHOS 4.4 -- -- --   Liver Function Tests:  Lab 03/10/12 0502  AST 14  ALT <5  ALKPHOS 118*  BILITOT 0.3  PROT 5.4*  ALBUMIN 2.7*    Lab 03/09/12 2004  LIPASE 37  AMYLASE --   No results found for this basename: AMMONIA:5 in the last 168 hours CBC:  Lab 03/11/12 0501 03/10/12 1742 03/10/12 1230 03/10/12 0502 03/09/12 1642  WBC 8.6 8.2 8.3 8.0 6.1  NEUTROABS -- -- -- -- 4.5  HGB 9.0* 9.3* 9.6* 9.7* 10.8*  HCT 29.5* 31.2* 32.1* 32.5* 36.0  MCV 102.1* 102.0* 101.9* 103.2* 103.2*  PLT 238 236 249 239 262   Cardiac Enzymes: No results found  for this basename: CKTOTAL:5,CKMB:5,CKMBINDEX:5,TROPONINI:5 in the last 168 hours BNP (last 3 results) No results found for this basename: PROBNP:3 in the last 8760 hours CBG:  Lab 03/11/12 0808 03/11/12 0725 03/11/12 0710 03/11/12 0646 03/11/12 0416  GLUCAP 121* 66* 59* 27* 125*    No results found for this or any previous visit (from the past 240 hour(s)).   Studies: Ct Abdomen Pelvis Wo Contrast  03/10/2012  *RADIOLOGY REPORT*  Clinical Data: Abdominal pain.  Rectal bleeding.  End-stage renal disease.  Diabetic.  Hypertension.  CT ABDOMEN AND PELVIS WITHOUT CONTRAST  Technique:  Multidetector CT imaging of the abdomen and pelvis was  performed following the standard protocol without intravenous contrast.  Comparison: 07/26/2010 ultrasound of the kidneys.  09/21/2004 CT of the abdomen and pelvis.  Findings: Prior partial colectomy.  Sigmoid and descending colon diverticula.  Two diverticula with minimal haziness along the periphery and therefore very low-level inflammation not entirely excluded in the proper clinical setting. No free intraperitoneal air free fluid.  Marked calcification of all visualized arterial structures with ectasia of the abdominal aorta with focal bulge of the lower thoracic aorta measuring 2.7 cm maximal dimension.  Narrowing of vessels by the significant calcified plaque most prominent left renal artery.  Tortuous splenic artery without aneurysm noted.  Atrophic kidneys with multiple low density renal lesions most of which appear to be cysts although some too small to characterize.  Evaluation of solid abdominal viscera is limited by lack of IV contrast.  Taking this limitation into account no worrisome hepatic, splenic, pancreatic or adrenal lesion.  Small calcifications are noted within the liver and spleen.  Small hiatal hernia.  Pleural thickening lung bases partially calcified on the right.  Coronary artery calcifications and calcification mitral valve and aortic valve.  Schmorl's node deformity/superior plate compression fracture L3 appears remote.  Calcified uterine fibroids.  Decompressed urinary bladder.  No calcified gallstone.  IMPRESSION: Sigmoid and descending colon diverticula.  Two diverticula with minimal haziness along the periphery and therefore very low-level inflammation not entirely excluded in the proper clinical setting.  Atherosclerotic type changes as detailed above.  Small hiatal hernia.  Atrophic kidneys with renal cysts as detailed above.   Original Report Authenticated By: Lacy Duverney, M.D.    Ct Head Wo Contrast  03/10/2012  *RADIOLOGY REPORT*  Clinical Data: Unresponsive earlier today.   Confusion.  History of end-stage renal disease, dialysis, high blood pressure, diabetes and Parkinson's disease.  CT HEAD WITHOUT CONTRAST  Technique:  Contiguous axial images were obtained from the base of the skull through the vertex without contrast.  Comparison: 01/04/2012.  Findings: No intracranial hemorrhage.  Remote infarcts involving the basal ganglia bilaterally.  There are prominent small vessel disease type changes.  No CT evidence of large acute infarct.  Small infarct would be difficult to exclude in this setting.  Global atrophy without hydrocephalus.  Prominent vascular calcifications.  Partially empty sella.  No intracranial mass lesion detected on this unenhanced exam.  IMPRESSION: No intracranial hemorrhage or CT findings of large acute infarct.  Remote infarcts and prominent small vessel disease type changes.  Atrophy.   Original Report Authenticated By: Lacy Duverney, M.D.    Dg Chest Port 1 View  03/09/2012  *RADIOLOGY REPORT*  Clinical Data: Shortness of breath.  PORTABLE CHEST - 1 VIEW  Comparison: 02/27/2012  Findings: Again noted is a right jugular dialysis catheter. Catheter tip is in the lower SVC region.  Lungs are clear without airspace disease or edema.  The thoracic aorta is heavily calcified.  Heart size is within normal limits.  IMPRESSION: No acute chest findings.  Dialysis catheter.   Original Report Authenticated By: Richarda Overlie, M.D.     Scheduled Meds:   . carbidopa-levodopa  1.5 tablet Oral TID  . carvedilol  12.5 mg Oral BID WC  . donepezil  10 mg Oral QHS  . FLUoxetine  20 mg Oral Daily  . insulin aspart  0-15 Units Subcutaneous TID WC  . insulin aspart  0-5 Units Subcutaneous QHS  . pantoprazole (PROTONIX) IV  40 mg Intravenous Q12H  . potassium chloride  40 mEq Oral Once  . sodium chloride  3 mL Intravenous Q12H  . sodium chloride  3 mL Intravenous Q12H   Continuous Infusions:   Principal Problem:  *Hematochezia Active Problems:  DM  HYPERTENSION   Chronic systolic heart failure  ESRD  Peripheral vascular disease, unspecified  Skin ulcer of great toe  Decubitus ulcer of heel, stage 1  Acute encephalopathy  Atrial fibrillation  Macrocytic anemia  Parkinson's disease  Diverticulosis    Time spent:    [facility]  [facility] Pager [phone]. If 8PM-8AM, please contact night-coverage at [email or web address], password [facility] 03/11/2012, 11:34 AM  LOS: 2 days

## 2012-03-11 NOTE — Progress Notes (Signed)
Notified of patient's glucose level by Rito Ehrlich. Patient appeared sweaty but was awake. No other symptoms present. Initiated hypoglycemic protocol. Given 2 cups of orange juice and glucose gel. Rechecked CBG; CBG 59.  Will continue to monitor and report given to oncoming nurse.

## 2012-03-11 NOTE — Progress Notes (Signed)
Patient is alert and eating a sandwich. Earlier today she was drowsy posterior later to low blood glucose. She hasn't had a bowel movement today. No rectal bleeding reported since colonoscopy yesterday. H&H on this morning 9.0 and 29.5. Assessment; Lower GI bleed possibly secondary to large sigmoid colon polyp which was snared yesterday along with 5 more polyps Two polyps were coagulated. She also has sigmoid colon diverticulosis. Endoscopic findings reviewed the patient's daughter not would be contacting her with results of biopsy. I was able to get off note from [facility]. She had right, colectomy in 2006 for a polyp that could not be removed endoscopically.

## 2012-03-11 NOTE — Progress Notes (Signed)
Katrina Meyer  MRN: [id number]  DOB/AGE: 1933-01-24 79 y.o.  Primary Care Physician:VYAS,DHRUV B., MD  Admit date: 03/09/2012  Chief Complaint:  Chief Complaint  Patient presents with  . Hypotension    S-Pt presented on  03/09/2012 with  Chief Complaint  Patient presents with  . Hypotension  .    Pt today feels better.   Pt has a care taker in room.     Meds    . carbidopa-levodopa  1.5 tablet Oral TID  . carvedilol  12.5 mg Oral BID WC  . donepezil  10 mg Oral QHS  . FLUoxetine  20 mg Oral Daily  . insulin aspart  0-15 Units Subcutaneous TID WC  . insulin aspart  0-5 Units Subcutaneous QHS  . pantoprazole (PROTONIX) IV  40 mg Intravenous Q12H  . potassium chloride  40 mEq Oral Once  . sodium chloride  3 mL Intravenous Q12H  . sodium chloride  3 mL Intravenous Q12H       Physical Exam: Vital signs in last 24 hours: Temp:  [97.4 F (36.3 C)-98 F (36.7 C)] 98 F (36.7 C) (02/05 0417) Pulse Rate:  [51-70] 60  (02/05 0417) Resp:  [14-20] 18  (02/05 0417) BP: (100-204)/(39-95) 176/72 mmHg (02/05 0417) SpO2:  [98 %-100 %] 100 % (02/05 0417) Weight change:  Last BM Date: 03/11/12  Intake/Output from previous day: 02/04 0701 - 02/05 0700 In: 480 [P.O.:480] Out: 0      Physical Exam: General- pt is awake,follows coomands Resp- No acute REsp distress, CTA B/L NO Rhonchi CVS- S1S2 regular ij rate and rhythm GIT- BS+, soft, NT, ND EXT- NO LE Edema, Cyanosis Access- Right Permacath   Lab Results: CBC  Basename 03/11/12 0501 03/10/12 1742  WBC 8.6 8.2  HGB 9.0* 9.3*  HCT 29.5* 31.2*  PLT 238 236    BMET  Basename 03/11/12 0501 03/11/12 0031 03/10/12 0502  NA 138 -- 141  K 3.3* -- 3.8  CL 95* -- 100  CO2 32 -- 31  GLUCOSE 58* 372* --  BUN 25* -- 19  CREATININE 5.33* -- 4.23*  CALCIUM 9.9 -- 9.4    MICRO No results found for this or any previous visit (from the past 240 hour(s)).    Lab Results  Component Value Date   CALCIUM 9.9  03/11/2012   CAION 1.20 06/27/2010   PHOS 4.4 03/11/2012           Impression: 1)Renal   ESRD on HD No MWF schedule Will dialyze today   2)HTN Target Organ damage  CKD  Medication-  On Alpha and beta Blockers   3)Anemia HGb at goal (9--11) Admitted with GI bleed GI work up - pt had  Colonoscopy  4)CKD Mineral-Bone Disorder PTH not avail Secondary Hyperparathyroidism w/u pending  Phosphorus at goal.   5)Afib- Cardiology and Primary team following   6)FEN  Hypokalemic K being replaced   NOrmonatremic   7)Acid base Co2 at goal  8) DM - Primary team following   Plan:  Pt is to be dialyzed today.  Will use 4 k bath . Will check BMet in am again to see K    [name] S 03/11/2012, 9:25 AM

## 2012-03-11 NOTE — Plan of Care (Signed)
Problem: Phase I Progression Outcomes Goal: OOB as tolerated unless otherwise ordered Outcome: Not Progressing NWB to R leg - in class

## 2012-03-11 NOTE — Progress Notes (Signed)
Hypoglycemic Event  CBG: 66  Treatment: 15 GM carbohydrate snack  Symptoms: Sweaty  Follow-up CBG: Time:0800 CBG Result:121  Possible Reasons for Event: Unknown  Comments/MD notified:Glucose 121 after drinking orange juice     Meyer, Katrina Anton  Remember to initiate Hypoglycemia Order Set & complete

## 2012-03-11 NOTE — Progress Notes (Signed)
The patient is receiving Protonix by the intravenous route.  Based on criteria approved by the Pharmacy and Therapeutics Committee and the Medical Executive Committee, the medication is being converted to the equivalent oral dose form per RN request.  These criteria include: -No Active GI bleeding -Able to tolerate diet of full liquids (or better) or tube feeding OR able to tolerate other medications by the oral or enteral route  If you have any questions about this conversion, please contact the Pharmacy Department (ext [phone]).  Thank you.  Katrina Meyer, [place] 03/11/2012 4:04 PM

## 2012-03-11 NOTE — Progress Notes (Addendum)
Pt family member concerned of redness of skin inside and around casted area of R leg.  Notified Dr. Kerry Hough, states he will assess in am.  Area has been thoroughly cleansed and pads placed to prevent moisture from getting in the cast, will continue to monitor. Relayed this message to the family member at bedside.

## 2012-03-11 NOTE — Clinical Social Work Note (Signed)
Patient asleep, CSW spoke w patient's friend/caregiver in room.  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  No FL2 or further CSW intervention required.    Santa Genera, LCSW Clinical Social Worker [phone])

## 2012-03-12 ENCOUNTER — Other Ambulatory Visit: Payer: Self-pay

## 2012-03-12 DIAGNOSIS — D649 Anemia, unspecified: Secondary | ICD-10-CM

## 2012-03-12 LAB — CBC
MCH: 30.9 pg (ref 26.0–34.0)
Platelets: 226 10*3/uL (ref 150–400)
RBC: 2.91 MIL/uL — ABNORMAL LOW (ref 3.87–5.11)

## 2012-03-12 LAB — BASIC METABOLIC PANEL
BUN: 12 mg/dL (ref 6–23)
CO2: 29 mEq/L (ref 19–32)
Calcium: 9.7 mg/dL (ref 8.4–10.5)
Chloride: 99 mEq/L (ref 96–112)
Creatinine, Ser: 3.32 mg/dL — ABNORMAL HIGH (ref 0.50–1.10)
GFR calc Af Amer: 14 mL/min — ABNORMAL LOW (ref >90)
GFR calc non Af Amer: 12 mL/min — ABNORMAL LOW (ref >90)
Glucose, Bld: 323 mg/dL — ABNORMAL HIGH (ref 70–99)
Potassium: 3.8 mEq/L (ref 3.5–5.1)
Sodium: 138 mEq/L (ref 135–145)

## 2012-03-12 LAB — GLUCOSE, CAPILLARY
Glucose-Capillary: 219 mg/dL — ABNORMAL HIGH (ref 70–99)
Glucose-Capillary: 267 mg/dL — ABNORMAL HIGH (ref 70–99)

## 2012-03-12 MED ORDER — HYDRALAZINE HCL 25 MG PO TABS
25.0000 mg | ORAL_TABLET | Freq: Three times a day (TID) | ORAL | Status: DC
  Administered 2012-03-12: 25 mg via ORAL
  Filled 2012-03-12: qty 1

## 2012-03-12 MED ORDER — ALTEPLASE 2 MG IJ SOLR
2.0000 mg | Freq: Once | INTRAMUSCULAR | Status: DC | PRN
  Filled 2012-03-12: qty 2

## 2012-03-12 MED ORDER — INSULIN GLARGINE 100 UNIT/ML ~~LOC~~ SOLN
5.0000 [IU] | Freq: Every day | SUBCUTANEOUS | Status: DC
  Administered 2012-03-12: 5 [IU] via SUBCUTANEOUS

## 2012-03-12 MED ORDER — HYDRALAZINE HCL 25 MG PO TABS: 25.0000 mg | ORAL_TABLET | Freq: Three times a day (TID) | ORAL | Status: DC

## 2012-03-12 MED ORDER — SODIUM CHLORIDE 0.9 % IV SOLN: 100.0000 mL | INTRAVENOUS | Status: DC | PRN

## 2012-03-12 MED ORDER — CARVEDILOL 12.5 MG PO TABS: 12.5000 mg | ORAL_TABLET | Freq: Two times a day (BID) | ORAL | Status: DC

## 2012-03-12 MED ORDER — PENTAFLUOROPROP-TETRAFLUOROETH EX AERO
1.0000 "application " | INHALATION_SPRAY | CUTANEOUS | Status: DC | PRN
  Filled 2012-03-12: qty 103.5

## 2012-03-12 NOTE — Progress Notes (Signed)
Subjective: Interval History: none.  Objective: Vital signs in last 24 hours: Temp:  [97.3 F (36.3 C)-98.6 F (37 C)] 98.6 F (37 C) (02/06 0500) Pulse Rate:  [62-88] 83  (02/06 0500) Resp:  [16-20] 16  (02/06 0500) BP: (120-235)/(38-98) 177/73 mmHg (02/06 0500) SpO2:  [98 %-100 %] 98 % (02/06 0500) Weight:  [76.2 kg (167 lb 15.9 oz)-82.192 kg (181 lb 3.2 oz)] 82.192 kg (181 lb 3.2 oz) (02/06 0500) Weight change:   Intake/Output from previous day: 02/05 0701 - 02/06 0700 In: 780 [P.O.:780] Out: 2203 [Stool:1] Intake/Output this shift:    General appearance: alert, cooperative and no distress Resp: clear to auscultation bilaterally Cardio: regular rate and rhythm, S1, S2 normal, no murmur, click, rub or gallop GI: soft, non-tender; bowel sounds normal; no masses,  no organomegaly Extremities: extremities normal, atraumatic, no cyanosis or edema  Lab Results:  [facility] 03/12/12 0457 03/11/12 0501  WBC 6.5 8.6  HGB 9.0* 9.0*  HCT 29.5* 29.5*  PLT 226 238   BMET:  Basename 03/12/12 0457 03/11/12 0501  NA 138 138  K 3.8 3.3*  CL 99 95*  CO2 29 32  GLUCOSE 323* 58*  BUN 12 25*  CREATININE 3.32* 5.33*  CALCIUM 9.7 9.9   No results found for this basename: PTH:2 in the last 72 hours Iron Studies:  Basename 03/09/12 2004  IRON 69  TIBC 148*  TRANSFERRIN --  FERRITIN 1327*    Studies/Results: No results found.  I have reviewed the patient's current medications.  Assessment/Plan: Problem #1 end-stage renal disease she status post hemodialysis yesterday her BUN is 12 creatinine is 3.32. Problem #2 hypokalemia potassium 3.6 has corrected. Problem #3 anemia at this moment seems to be multifactorial her hemoglobin is 9 hematocrit 29.5 stable. Problem #4 hypertension her blood pressure seems to be reasonably controlled Problem #5 of diabetes Problem #6 metabolic bone disease calcium and phosphorus are was in acceptable range. Problem #7 history of  dementia Problem #8 tibial fracture. Plan: We'll make arrangements for patient to get dialysis tomorrow which is her regular schedule. If patient is going to be discharged she'll get her dialysis as an outpatient. We'll check her basic metabolic panel and CBC in the morning.    LOS: 3 days   Charyl Minervini S 03/12/2012,7:28 AM

## 2012-03-12 NOTE — Discharge Summary (Signed)
Physician Discharge Summary  Katrina Meyer ZOX:[id number] DOB: 07-19-1932 DOA: 03/09/2012  PCP: Ignatius Specking., MD  Admit date: 03/09/2012 Discharge date: 03/12/2012  Time spent: 40 minutes  Recommendations for Outpatient Follow-up:  1. Patient will be discharged home today 2. Follow up with primary care doctor in 2 weeks 3. Follow up with dialysis center today as scheduled 4. Follow up with Dr. Otelia Sergeant tomorrow to evaluate cast 5. Follow up with Dr. Daleen Squibb on 2/19 to evaluate atrial fibrillation  Discharge Diagnoses:  Principal Problem:  *Hematochezia Active Problems:  DM  HYPERTENSION  Chronic systolic heart failure  ESRD  Peripheral vascular disease, unspecified  Skin ulcer of great toe  Decubitus ulcer of heel, stage 1  Acute encephalopathy  Atrial fibrillation  Macrocytic anemia  Parkinson's disease  Diverticulosis   Discharge Condition: improved  Diet recommendation: low carb, low salt  Filed Weights   03/11/12 1130 03/11/12 1549 03/12/12 0500  Weight: 78.9 kg (173 lb 15.1 oz) 76.2 kg (167 lb 15.9 oz) 82.192 kg (181 lb 3.2 oz)    History of present illness:  Katrina Meyer is a 79 y.o. female with a past medical history of end-stage renal disease on dialysis on Monday, Wednesday, Friday, Parkinson's disease, history of systolic heart failure, though no EF is available, diabetes, hypertension, hypothyroidism. She was recently discharged from [facility] last week after evaluation for ulcer on her right first toe. There was some concern about vascular insufficiency. Patient is accompanied by her daughter and caregiver. Unfortunately, both of them are very poor historians. Patient is also unable to provide much history. So, most of the data is available from previous notes. According to nursing notes the patient was at dialysis when she was found to be unresponsive. She was found to be hypotensive with a systolic of 84. Patient was placed in Trendelenburg position. Apparently,  there was some bleeding per rectum also noted at that time. She also had a sat of 77%. Subsequently patient became alert and responsive. Apparently, she had some bloody stool earlier today as well, but there is no reports of black colored stool at home. Patient continues to be somewhat lethargic. She is awake, alert, however, does not really answer any questions. According to the daughter this is new. However, I think the daughter's account is also not very reliable at this time. Patient has a cast in her right leg for fracture sustained in November. There has been a lot of issue with healing. She is supposed to be nonweight bearing on that side. Patient did have abdominal pain, earlier today. So, very sketchy and poor history because of the above reasons.   Hospital Course:  This lady was admitted to the hospital with lower GI bleeding.  She was seen by Dr. Karilyn Cota and underwent colonoscopy with results as below.  It was felt that this was possibly a diverticular bleed.  It was recommended that patient not take any aspirin for at least 1 week due to risk of bleeding.  Patient did not require any transfusions of prbc, and her hemoglobin has stabilized at 9.0.  She has not had any further blood in her stools.  Dr. Patty Sermons office will follow up with patient regarding biopsy results.  Patient was found to have an episode of atrial fibrillation. She has converted back to sinus rhythm. She was seen by cardiology and underwent 2-D echocardiogram which was unremarkable. She is continued on beta blocker and has maintained sinus rhythm. She's not felt appropriate candidate for anticoagulation  due to bleeding risk. She'll follow up with cardiology in 2 weeks to further discuss this.  End-stage renal disease. Patient was followed by nephrology for routine dialysis needs.  Right tibial/fibular fracture. Patient will followup with her orthopedic surgeon tomorrow to evaluate cast and see if this needs  changing.  Diabetes. Patient's A1c is 6.6. This indicates good control at home. We will discharge her on her outpatient regimen.  Patient is now approaching her baseline. She is felt stable for discharge home to continue care as an outpatient.  Procedures: Colonoscopy with snare polypectomy and Hemoclip application by Dr. Karilyn Cota on 03/10/12:Six polyps snared including large pedunculated polyp over 30 mm in the sigmoid colon with ulcerated surface. She possibly bled from this polyp. Single Hemoclip applied to polypectomy site.  Two small polyps coagulated as above.  Multiple diverticula at sigmoid colon.  2D echocardiogram: - Left ventricle: The cavity size was normal. Wall thickness was increased in a pattern of severe LVH. Systolic function was normal. The estimated ejection fraction was in the range of 55% to 60%. Wall motion was normal; there were no regional wall motion abnormalities. - Aortic valve: Mild regurgitation. - Mitral valve: Calcified annulus. - Pulmonary arteries: Systolic pressure was mildly to moderately increased. PA peak pressure: 49mm Hg (S). - Pericardium, extracardiac: A trivial pericardial effusion was identified.     Consultations:  [facility] cardiology  Nephrology, Dr. Kristian Covey  Gastroenterology, Dr. Karilyn Cota  Discharge Exam: Filed Vitals:   03/11/12 1549 03/11/12 1619 03/11/12 2144 03/12/12 0500  BP: 170/82 191/61 144/38 177/73  Pulse: 64 64 88 83  Temp: 97.4 F (36.3 C)  97.3 F (36.3 C) 98.6 F (37 C)  TempSrc: Oral  Oral Oral  Resp: 16 16 20 16   Height:      Weight: 76.2 kg (167 lb 15.9 oz)   82.192 kg (181 lb 3.2 oz)  SpO2: 100%  100% 98%    General: NAD Cardiovascular: s1, s2, rrr Respiratory: cta b  Discharge Instructions  Discharge Orders    Future Appointments: Provider: Department: Dept Phone: Center:   03/25/2012 11:30 AM Gaylord Shih, MD [facility] [place] [phone] [facility]   04/28/2012 2:30 PM [facility]  [facility] [phone] [facility]   04/28/2012 3:00 PM [facility] [phone] [facility]   04/28/2012 3:40 PM Evern Bio, NP [facility] [phone] [facility]     Future Orders Please Complete By Expires   Diet - low sodium heart healthy      Diet Carb Modified      Increase activity slowly      Call MD for:  temperature >100.4      Call MD for:  severe uncontrolled pain      Call MD for:  difficulty breathing, headache or visual disturbances      Call MD for:  persistant dizziness or light-headedness      Call MD for:  extreme fatigue          Medication List     As of 03/12/2012 10:08 AM    STOP taking these medications         aspirin EC 325 MG tablet      cephALEXin 250 MG capsule   Commonly known as: KEFLEX      TAKE these medications         atorvastatin 10 MG tablet   Commonly known as: LIPITOR   Take 1 tablet by  mouth Daily.      carbidopa-levodopa 25-100 MG per tablet   Commonly known as: SINEMET IR   Take 1.5 tablets by mouth 3 (three) times daily.      carvedilol 12.5 MG tablet   Commonly known as: COREG   Take 1 tablet (12.5 mg total) by mouth 2 (two) times daily with a meal.      donepezil 10 MG tablet   Commonly known as: ARICEPT   Take 1 tablet by mouth Daily.      fexofenadine 180 MG tablet   Commonly known as: ALLEGRA   Take 180 mg by mouth daily.      FLUoxetine 40 MG capsule   Commonly known as: PROZAC   Take 1 capsule by mouth daily.      hydrALAZINE 25 MG tablet   Commonly known as: APRESOLINE   Take 1 tablet (25 mg total) by mouth every 8 (eight) hours.      HYDROcodone-acetaminophen 5-325 MG per tablet   Commonly known as: NORCO/VICODIN   Take 1 tablet by mouth every 6 (six) hours as needed for pain.      insulin glargine 100 UNIT/ML injection   Commonly known as: LANTUS   Inject 5 Units into the skin at bedtime.      insulin lispro 100  UNIT/ML injection   Commonly known as: HUMALOG   Inject into the skin 3 (three) times daily before meals. As directed per sliding scale      levothyroxine 25 MCG tablet   Commonly known as: SYNTHROID, LEVOTHROID   Take 25 mcg by mouth every morning.      loperamide 2 MG capsule   Commonly known as: IMODIUM   Take 2 mg by mouth daily as needed. For anti-diarrheal      multivitamin Tabs tablet   Take 1 tablet by mouth daily.      PHOSLYRA 667 MG/5ML Soln   Generic drug: calcium acetate (Phos Binder)   Take 667-1,334 mg by mouth 3 (three) times daily with meals. Take 10 milliliters (ML) (1334mg  total)  with meals and take 5 milliliters (ML) (667mg  total) with snacks           Follow-up Information    Follow up with Kerrin Champagne, MD. (tomorrow as scheduled)    Contact information:   [address] [place] [address] [phone]       Follow up with Dialysis center today as scheduled.      Follow up with VYAS,DHRUV B., MD. Schedule an appointment as soon as possible for a visit in 2 weeks.   Contact information:   [address] [place] [address] [phone]       Follow up with Malissa Hippo, MD. (will call you with biopsy results)    Contact information:   [address], [address] [place] [address] [phone]       Follow up with Valera Castle, MD. On 03/25/2012. (11:30am)    Contact information:   [address] [place] [address] [phone]           The results of significant diagnostics from this hospitalization (including imaging, microbiology, ancillary and laboratory) are listed below for reference.    Significant Diagnostic Studies: Ct Abdomen Pelvis Wo Contrast  03/10/2012  *RADIOLOGY REPORT*  Clinical Data: Abdominal pain.  Rectal bleeding.  End-stage renal disease.  Diabetic.  Hypertension.  CT ABDOMEN AND PELVIS WITHOUT CONTRAST  Technique:  Multidetector  CT imaging of the abdomen and pelvis was  performed following the standard protocol without intravenous contrast.  Comparison: 07/26/2010 ultrasound of the kidneys.  09/21/2004 CT of the abdomen and pelvis.  Findings: Prior partial colectomy.  Sigmoid and descending colon diverticula.  Two diverticula with minimal haziness along the periphery and therefore very low-level inflammation not entirely excluded in the proper clinical setting. No free intraperitoneal air free fluid.  Marked calcification of all visualized arterial structures with ectasia of the abdominal aorta with focal bulge of the lower thoracic aorta measuring 2.7 cm maximal dimension.  Narrowing of vessels by the significant calcified plaque most prominent left renal artery.  Tortuous splenic artery without aneurysm noted.  Atrophic kidneys with multiple low density renal lesions most of which appear to be cysts although some too small to characterize.  Evaluation of solid abdominal viscera is limited by lack of IV contrast.  Taking this limitation into account no worrisome hepatic, splenic, pancreatic or adrenal lesion.  Small calcifications are noted within the liver and spleen.  Small hiatal hernia.  Pleural thickening lung bases partially calcified on the right.  Coronary artery calcifications and calcification mitral valve and aortic valve.  Schmorl's node deformity/superior plate compression fracture L3 appears remote.  Calcified uterine fibroids.  Decompressed urinary bladder.  No calcified gallstone.  IMPRESSION: Sigmoid and descending colon diverticula.  Two diverticula with minimal haziness along the periphery and therefore very low-level inflammation not entirely excluded in the proper clinical setting.  Atherosclerotic type changes as detailed above.  Small hiatal hernia.  Atrophic kidneys with renal cysts as detailed above.   Original Report Authenticated By: Lacy Duverney, M.D.    Ct Head Wo Contrast  03/10/2012  *RADIOLOGY REPORT*  Clinical Data: Unresponsive earlier today.   Confusion.  History of end-stage renal disease, dialysis, high blood pressure, diabetes and Parkinson's disease.  CT HEAD WITHOUT CONTRAST  Technique:  Contiguous axial images were obtained from the base of the skull through the vertex without contrast.  Comparison: 01/04/2012.  Findings: No intracranial hemorrhage.  Remote infarcts involving the basal ganglia bilaterally.  There are prominent small vessel disease type changes.  No CT evidence of large acute infarct.  Small infarct would be difficult to exclude in this setting.  Global atrophy without hydrocephalus.  Prominent vascular calcifications.  Partially empty sella.  No intracranial mass lesion detected on this unenhanced exam.  IMPRESSION: No intracranial hemorrhage or CT findings of large acute infarct.  Remote infarcts and prominent small vessel disease type changes.  Atrophy.   Original Report Authenticated By: Lacy Duverney, M.D.    Dg Chest Port 1 View  03/09/2012  *RADIOLOGY REPORT*  Clinical Data: Shortness of breath.  PORTABLE CHEST - 1 VIEW  Comparison: 02/27/2012  Findings: Again noted is a right jugular dialysis catheter. Catheter tip is in the lower SVC region.  Lungs are clear without airspace disease or edema.  The thoracic aorta is heavily calcified.  Heart size is within normal limits.  IMPRESSION: No acute chest findings.  Dialysis catheter.   Original Report Authenticated By: Richarda Overlie, M.D.    Dg Chest Portable 1 View  02/27/2012  *RADIOLOGY REPORT*  Clinical Data: Preoperative chest radiograph.  PORTABLE CHEST - 1 VIEW  Comparison: Chest radiograph performed 01/04/2012  Findings: Mild peripheral right midlung opacity could reflect mild pneumonia, or may reflect overlying soft tissues.  Would correlate for associated symptoms.  The left lung appears grossly clear.  No definite pleural effusion or pneumothorax is seen.  The cardiomediastinal silhouette is enlarged; diffuse calcification is noted along the descending thoracic aorta.  A  right-sided IJ dual lumen catheter is seen ending about the cavoatrial junction. No acute osseous abnormalities are seen.  IMPRESSION:  1.  Mild peripheral right midlung opacity could reflect mild pneumonia, or may reflect overlying soft tissues.  Would correlate for associated symptoms. 2.  Cardiomegaly noted.   Original Report Authenticated By: Tonia Ghent, M.D.     Microbiology: No results found for this or any previous visit (from the past 240 hour(s)).   Labs: Basic Metabolic Panel:  Lab 03/12/12 1610 03/11/12 0501 03/11/12 0031 03/10/12 0502 03/09/12 1642  NA 138 138 -- 141 143  K 3.8 3.3* -- 3.8 3.3*  CL 99 95* -- 100 99  CO2 29 32 -- 31 34*  GLUCOSE 323* 58* 372* 119* 150*  BUN 12 25* -- 19 11  CREATININE 3.32* 5.33* -- 4.23* 3.32*  CALCIUM 9.7 9.9 -- 9.4 9.4  MG -- -- -- -- --  PHOS -- 4.4 -- -- --   Liver Function Tests:  Lab 03/10/12 0502  AST 14  ALT <5  ALKPHOS 118*  BILITOT 0.3  PROT 5.4*  ALBUMIN 2.7*    Lab 03/09/12 2004  LIPASE 37  AMYLASE --   No results found for this basename: AMMONIA:5 in the last 168 hours CBC:  Lab 03/12/12 0457 03/11/12 0501 03/10/12 1742 03/10/12 1230 03/10/12 0502 03/09/12 1642  WBC 6.5 8.6 8.2 8.3 8.0 --  NEUTROABS -- -- -- -- -- 4.5  HGB 9.0* 9.0* 9.3* 9.6* 9.7* --  HCT 29.5* 29.5* 31.2* 32.1* 32.5* --  MCV 101.4* 102.1* 102.0* 101.9* 103.2* --  PLT 226 238 236 249 239 --   Cardiac Enzymes: No results found for this basename: CKTOTAL:5,CKMB:5,CKMBINDEX:5,TROPONINI:5 in the last 168 hours BNP: BNP (last 3 results) No results found for this basename: PROBNP:3 in the last 8760 hours CBG:  Lab 03/12/12 0746 03/12/12 0414 03/12/12 0034 03/11/12 2029 03/11/12 1645  GLUCAP 219* 313* 267* 175* 114*       Signed:  Monte Bronder  [facility] 03/12/2012, 10:08 AM

## 2012-03-12 NOTE — Progress Notes (Signed)
Patient discharged home with family and caregivers.  IV removed - WNL.  Caregivers given discharge instructions and education on new meds.  Follow up appointments in place.  Patient dressed in own clothing.  No questions at this time.  Verbalize understanding.  Pink paper stating that patient can not have MRI until cleared by radiology given.  Stable to discharge

## 2012-03-12 NOTE — Evaluation (Signed)
Clinical/Bedside Swallow Evaluation Patient Details  Name: Katrina Meyer MRN: [id number] Date of Birth: Aug 12, 1932  Today's Date: 03/12/2012 Time: 1900-1945 SLP Time Calculation (min): 45 min  Past Medical History:  Past Medical History  Diagnosis Date  . Hyperlipidemia   . Hypertension   . Diabetes mellitus   . DVT (deep venous thrombosis)   . End stage renal disease on dialysis   . Stroke   . Dementia   . Parkinson's disease   . Tibia/fibula fracture 01/04/2012    Treated with long leg casting. Cast change in hospital 02/27/12   . Skin ulcer of great toe 02/27/2012    Right great toe   . Hematochezia 03/09/2012  . Atrial fibrillation 03/09/2012    Seen on older EKG as well but family not aware.   . Macrocytic anemia 03/09/2012  . Diverticulosis 03/10/2012   Past Surgical History:  Past Surgical History  Procedure Date  . Dg av dialysis graft declot or   . Av fistula repair Aug. 2013    Left arm  . Cholecystectomy   . Colon surgery     Aprrox 2006 at [facility] after she had a colonoscopy  . Colonoscopy 03/10/2012    Procedure: COLONOSCOPY;  Surgeon: Malissa Hippo, MD;  Location: [facility];  Service: Endoscopy;  Laterality: N/A;  . Esophagogastroduodenoscopy 03/10/2012    Procedure: ESOPHAGOGASTRODUODENOSCOPY (EGD);  Surgeon: Malissa Hippo, MD;  Location: [facility];  Service: Endoscopy;  Laterality: N/A;  EGD if TCS normal.   HPI:  Patient is 79 year old African female with acute GI bleed felt to be from lower GI tract. she has history of colonic adenomas and underwent either right, colectomy or resection of segment of ascending colon in 2006.Lower endoscopy earlier today with Dr. Karilyn Cota.   Assessment / Plan / Recommendation Clinical Impression  Pt shows no overt signs or symptoms of aspiration. No dysphagia present with textures and consistencies presented.    Aspiration Risk  None    Diet Recommendation Regular;Thin liquid   Liquid Administration via:  Cup;Straw Medication Administration: Whole meds with liquid Supervision: Patient able to self feed Postural Changes and/or Swallow Maneuvers: Seated upright 90 degrees;Upright 30-60 min after meal    Other  Recommendations Oral Care Recommendations: Oral care BID   Follow Up Recommendations  None    Frequency and Duration  N/A          Swallow Study Prior Functional Status   Lives with husband, has caregivers    General Date of Onset: 03/09/12 HPI: Patient is 79 year old African female with acute GI bleed felt to be from lower GI tract. she has history of colonic adenomas and underwent either right, colectomy or resection of segment of ascending colon in 2006.Lower endoscopy earlier today with Dr. Karilyn Cota. Type of Study: Bedside swallow evaluation Diet Prior to this Study: NPO Temperature Spikes Noted: No Respiratory Status: Supplemental O2 delivered via (comment) History of Recent Intubation: No Behavior/Cognition: Alert;Cooperative;Pleasant mood;Confused Oral Cavity - Dentition: Adequate natural dentition Self-Feeding Abilities: Able to feed self Patient Positioning: Upright in bed Baseline Vocal Quality: Clear;Low vocal intensity Volitional Cough: Weak Volitional Swallow: Unable to elicit    Oral/Motor/Sensory Function Overall Oral Motor/Sensory Function: Appears within functional limits for tasks assessed   Ice Chips Ice chips: Within functional limits Presentation: Spoon   Thin Liquid Thin Liquid: Within functional limits Presentation: Cup;Straw;Self Fed    Nectar Thick Nectar Thick Liquid: Within functional limits Presentation: Spoon Other Comments: ice cream   Honey Thick  Honey Thick Liquid: Not tested   Puree Puree: Within functional limits Presentation: Spoon   Solid       Solid: Within functional limits Presentation: Self Fed Other Comments: Limited assessment as pt is on full liquids, ate one graham cracker for assessment purposes.        Katrina Meyer 03/10/2012,7:50 PM

## 2012-03-18 ENCOUNTER — Other Ambulatory Visit: Payer: Self-pay

## 2012-03-18 ENCOUNTER — Encounter (INDEPENDENT_AMBULATORY_CARE_PROVIDER_SITE_OTHER): Payer: Self-pay | Admitting: *Deleted

## 2012-03-23 MED ORDER — SODIUM CHLORIDE 0.9 % IV SOLN: INTRAVENOUS | Status: DC

## 2012-03-23 NOTE — Progress Notes (Signed)
I have called home number numerous times today and do not get an answer. The mail box for this phone is full.  I called daughter's number and she said she was just talking to mother to try again.  I continued to try Katrina Meyer no answer.  Daughter's number is no longer listed in computer.

## 2012-03-24 ENCOUNTER — Ambulatory Visit (HOSPITAL_COMMUNITY): Payer: Medicare Other

## 2012-03-24 ENCOUNTER — Encounter (HOSPITAL_COMMUNITY)
Admission: RE | Disposition: A | Discharge status: Home / Self Care | Payer: Self-pay | Source: Ambulatory Visit | Attending: Vascular Surgery

## 2012-03-24 ENCOUNTER — Encounter (HOSPITAL_COMMUNITY): Payer: Self-pay

## 2012-03-24 ENCOUNTER — Ambulatory Visit (HOSPITAL_COMMUNITY)
Admission: RE | Admit: 2012-03-24 | Discharge: 2012-03-24 | Disposition: A | Discharge status: Home / Self Care | Payer: Medicare Other | Source: Ambulatory Visit | Attending: Vascular Surgery | Admitting: Vascular Surgery

## 2012-03-24 ENCOUNTER — Ambulatory Visit (HOSPITAL_COMMUNITY): Payer: Medicare Other | Admitting: Anesthesiology

## 2012-03-24 ENCOUNTER — Encounter (HOSPITAL_COMMUNITY): Payer: Self-pay | Admitting: Anesthesiology

## 2012-03-24 DIAGNOSIS — Z992 Dependence on renal dialysis: Secondary | ICD-10-CM | POA: Insufficient documentation

## 2012-03-24 DIAGNOSIS — N186 End stage renal disease: Secondary | ICD-10-CM

## 2012-03-24 DIAGNOSIS — I120 Hypertensive chronic kidney disease with stage 5 chronic kidney disease or end stage renal disease: Secondary | ICD-10-CM | POA: Insufficient documentation

## 2012-03-24 DIAGNOSIS — I871 Compression of vein: Secondary | ICD-10-CM | POA: Insufficient documentation

## 2012-03-24 DIAGNOSIS — F0280 Dementia in other diseases classified elsewhere without behavioral disturbance: Secondary | ICD-10-CM | POA: Insufficient documentation

## 2012-03-24 DIAGNOSIS — E119 Type 2 diabetes mellitus without complications: Secondary | ICD-10-CM | POA: Insufficient documentation

## 2012-03-24 DIAGNOSIS — T82898A Other specified complication of vascular prosthetic devices, implants and grafts, initial encounter: Secondary | ICD-10-CM | POA: Insufficient documentation

## 2012-03-24 DIAGNOSIS — G20A1 Parkinson's disease without dyskinesia, without mention of fluctuations: Secondary | ICD-10-CM | POA: Insufficient documentation

## 2012-03-24 DIAGNOSIS — G20 Parkinson's disease: Secondary | ICD-10-CM | POA: Insufficient documentation

## 2012-03-24 DIAGNOSIS — Y832 Surgical operation with anastomosis, bypass or graft as the cause of abnormal reaction of the patient, or of later complication, without mention of misadventure at the time of the procedure: Secondary | ICD-10-CM | POA: Insufficient documentation

## 2012-03-24 HISTORY — PX: LIGATION OF ARTERIOVENOUS  FISTULA: SHX5948

## 2012-03-24 LAB — GLUCOSE, CAPILLARY: Glucose-Capillary: 118 mg/dL — ABNORMAL HIGH (ref 70–99)

## 2012-03-24 LAB — SURGICAL PCR SCREEN
MRSA, PCR: POSITIVE — AB
Staphylococcus aureus: POSITIVE — AB

## 2012-03-24 LAB — POCT I-STAT 4, (NA,K, GLUC, HGB,HCT)
Glucose, Bld: 159 mg/dL — ABNORMAL HIGH (ref 70–99)
Potassium: 3.6 mEq/L (ref 3.5–5.1)

## 2012-03-24 SURGERY — LIGATION OF ARTERIOVENOUS  FISTULA
Anesthesia: Monitor Anesthesia Care | Site: Arm Upper | Laterality: Right | Wound class: Clean

## 2012-03-24 MED ORDER — SODIUM CHLORIDE 0.9 % IV SOLN
INTRAVENOUS | Status: DC | PRN
  Administered 2012-03-24 (×2): via INTRAVENOUS

## 2012-03-24 MED ORDER — ACETAMINOPHEN 10 MG/ML IV SOLN: 1000.0000 mg | Freq: Once | INTRAVENOUS | Status: DC | PRN

## 2012-03-24 MED ORDER — FENTANYL CITRATE 0.05 MG/ML IJ SOLN
INTRAMUSCULAR | Status: DC | PRN
  Administered 2012-03-24 (×2): 25 ug via INTRAVENOUS
  Administered 2012-03-24: 50 ug via INTRAVENOUS

## 2012-03-24 MED ORDER — SODIUM CHLORIDE 0.9 % IR SOLN
Status: DC | PRN
  Administered 2012-03-24: 12:00:00

## 2012-03-24 MED ORDER — MUPIROCIN 2 % EX OINT
TOPICAL_OINTMENT | Freq: Two times a day (BID) | CUTANEOUS | Status: DC
  Administered 2012-03-24: 09:00:00 via NASAL
  Filled 2012-03-24 (×2): qty 22

## 2012-03-24 MED ORDER — PROPOFOL 10 MG/ML IV BOLUS
INTRAVENOUS | Status: DC | PRN
  Administered 2012-03-24 (×2): 20 mg via INTRAVENOUS
  Administered 2012-03-24: 100 mg via INTRAVENOUS
  Administered 2012-03-24: 20 mg via INTRAVENOUS

## 2012-03-24 MED ORDER — THROMBIN 20000 UNITS EX SOLR
CUTANEOUS | Status: AC
  Filled 2012-03-24: qty 20000

## 2012-03-24 MED ORDER — PHENYLEPHRINE HCL 10 MG/ML IJ SOLN
INTRAMUSCULAR | Status: DC | PRN
  Administered 2012-03-24: 80 ug via INTRAVENOUS

## 2012-03-24 MED ORDER — THROMBIN 20000 UNITS EX SOLR
CUTANEOUS | Status: DC | PRN
  Administered 2012-03-24: 12:00:00 via TOPICAL

## 2012-03-24 MED ORDER — 0.9 % SODIUM CHLORIDE (POUR BTL) OPTIME
TOPICAL | Status: DC | PRN
  Administered 2012-03-24: 1000 mL

## 2012-03-24 MED ORDER — LIDOCAINE-EPINEPHRINE (PF) 1 %-1:200000 IJ SOLN
INTRAMUSCULAR | Status: DC | PRN
  Administered 2012-03-24: 15.5 mL

## 2012-03-24 MED ORDER — BUPIVACAINE HCL 0.5 % IJ SOLN
INTRAMUSCULAR | Status: DC | PRN
  Administered 2012-03-24: 15.5 mL

## 2012-03-24 MED ORDER — EPHEDRINE SULFATE 50 MG/ML IJ SOLN
INTRAMUSCULAR | Status: DC | PRN
  Administered 2012-03-24 (×2): 10 mg via INTRAVENOUS

## 2012-03-24 MED ORDER — CARVEDILOL 12.5 MG PO TABS
12.5000 mg | ORAL_TABLET | Freq: Once | ORAL | Status: AC
  Administered 2012-03-24: 12.5 mg via ORAL

## 2012-03-24 MED ORDER — LIDOCAINE-EPINEPHRINE (PF) 1 %-1:200000 IJ SOLN
INTRAMUSCULAR | Status: AC
  Filled 2012-03-24: qty 10

## 2012-03-24 MED ORDER — BUPIVACAINE HCL (PF) 0.5 % IJ SOLN
INTRAMUSCULAR | Status: AC
  Filled 2012-03-24: qty 30

## 2012-03-24 MED ORDER — HEPARIN SODIUM (PORCINE) 1000 UNIT/ML IJ SOLN
INTRAMUSCULAR | Status: DC | PRN
  Administered 2012-03-24: 3000 [IU] via INTRAVENOUS

## 2012-03-24 MED ORDER — CARVEDILOL 12.5 MG PO TABS
ORAL_TABLET | ORAL | Status: AC
  Filled 2012-03-24: qty 1

## 2012-03-24 MED ORDER — HYDROCODONE-ACETAMINOPHEN 5-325 MG PO TABS: 1.0000 | ORAL_TABLET | ORAL | Status: DC | PRN

## 2012-03-24 SURGICAL SUPPLY — 49 items
BANDAGE ESMARK 6X9 LF (GAUZE/BANDAGES/DRESSINGS) ×2 IMPLANT
BNDG ESMARK 6X9 LF (GAUZE/BANDAGES/DRESSINGS) ×3
CANISTER SUCTION 2500CC (MISCELLANEOUS) ×3 IMPLANT
CLIP TI MEDIUM 6 (CLIP) ×3 IMPLANT
CLIP TI WIDE RED SMALL 6 (CLIP) ×3 IMPLANT
CLOTH BEACON ORANGE TIMEOUT ST (SAFETY) ×3 IMPLANT
COVER PROBE W GEL 5X96 (DRAPES) ×3 IMPLANT
COVER SURGICAL LIGHT HANDLE (MISCELLANEOUS) ×3 IMPLANT
CUFF TOURNIQUET SINGLE 24IN (TOURNIQUET CUFF) ×3 IMPLANT
DECANTER SPIKE VIAL GLASS SM (MISCELLANEOUS) IMPLANT
DERMABOND ADHESIVE PROPEN (GAUZE/BANDAGES/DRESSINGS) ×2
DERMABOND ADVANCED (GAUZE/BANDAGES/DRESSINGS) ×1
DERMABOND ADVANCED .7 DNX12 (GAUZE/BANDAGES/DRESSINGS) ×2 IMPLANT
DERMABOND ADVANCED .7 DNX6 (GAUZE/BANDAGES/DRESSINGS) ×4 IMPLANT
DRAPE PROXIMA HALF (DRAPES) ×3 IMPLANT
ELECT REM PT RETURN 9FT ADLT (ELECTROSURGICAL) ×3
ELECTRODE REM PT RTRN 9FT ADLT (ELECTROSURGICAL) ×2 IMPLANT
GEL ULTRASOUND 20GR AQUASONIC (MISCELLANEOUS) ×3 IMPLANT
GLOVE BIO SURGEON STRL SZ 6.5 (GLOVE) ×6 IMPLANT
GLOVE BIO SURGEON STRL SZ7 (GLOVE) ×3 IMPLANT
GLOVE BIO SURGEON STRL SZ7.5 (GLOVE) ×3 IMPLANT
GLOVE BIO SURGEON STRL SZ8.5 (GLOVE) ×3 IMPLANT
GLOVE BIOGEL PI IND STRL 7.0 (GLOVE) ×6 IMPLANT
GLOVE BIOGEL PI IND STRL 7.5 (GLOVE) ×4 IMPLANT
GLOVE BIOGEL PI INDICATOR 7.0 (GLOVE) ×3
GLOVE BIOGEL PI INDICATOR 7.5 (GLOVE) ×2
GLOVE ECLIPSE 7.0 STRL STRAW (GLOVE) ×3 IMPLANT
GOWN PREVENTION PLUS XLARGE (GOWN DISPOSABLE) ×3 IMPLANT
GOWN STRL NON-REIN LRG LVL3 (GOWN DISPOSABLE) ×9 IMPLANT
KIT BASIN OR (CUSTOM PROCEDURE TRAY) ×3 IMPLANT
KIT ROOM TURNOVER OR (KITS) ×3 IMPLANT
LOOP VESSEL MINI RED (MISCELLANEOUS) IMPLANT
NS IRRIG 1000ML POUR BTL (IV SOLUTION) ×3 IMPLANT
PACK CV ACCESS (CUSTOM PROCEDURE TRAY) ×3 IMPLANT
PAD ARMBOARD 7.5X6 YLW CONV (MISCELLANEOUS) ×6 IMPLANT
SPONGE INTESTINAL PEANUT (DISPOSABLE) ×3 IMPLANT
SPONGE SURGIFOAM ABS GEL 100 (HEMOSTASIS) IMPLANT
SUT MNCRL AB 4-0 PS2 18 (SUTURE) ×3 IMPLANT
SUT PROLENE 5 0 C 1 24 (SUTURE) ×6 IMPLANT
SUT PROLENE 6 0 BV (SUTURE) ×3 IMPLANT
SUT PROLENE 6 0 CC (SUTURE) IMPLANT
SUT SILK 0 (SUTURE) ×3 IMPLANT
SUT VIC AB 3-0 SH 27 (SUTURE) ×2
SUT VIC AB 3-0 SH 27X BRD (SUTURE) ×4 IMPLANT
SUT VICRYL 4-0 PS2 18IN ABS (SUTURE) ×3 IMPLANT
TOWEL OR 17X24 6PK STRL BLUE (TOWEL DISPOSABLE) ×3 IMPLANT
TOWEL OR 17X26 10 PK STRL BLUE (TOWEL DISPOSABLE) ×3 IMPLANT
UNDERPAD 30X30 INCONTINENT (UNDERPADS AND DIAPERS) ×3 IMPLANT
WATER STERILE IRR 1000ML POUR (IV SOLUTION) ×3 IMPLANT

## 2012-03-24 NOTE — Anesthesia Procedure Notes (Signed)
Procedure Name: LMA Insertion Date/Time: 03/24/2012 11:33 AM Performed by: Gwenyth Allegra Pre-anesthesia Checklist: Patient identified, Timeout performed, Emergency Drugs available, Suction available and Patient being monitored Patient Re-evaluated:Patient Re-evaluated prior to inductionOxygen Delivery Method: Circle system utilized Preoxygenation: Pre-oxygenation with 100% oxygen Intubation Type: IV induction Ventilation: Mask ventilation without difficulty LMA: LMA inserted LMA Size: 4.0 Number of attempts: 1 Tube secured with: Tape Dental Injury: Teeth and Oropharynx as per pre-operative assessment

## 2012-03-24 NOTE — OR Nursing (Signed)
Patient with dialysis catheter right subclavian area. Has been accessed pre-op for IV fluids. NS infusing upon arrival to PACU via blue port. Red port capped.

## 2012-03-24 NOTE — Anesthesia Postprocedure Evaluation (Signed)
  Anesthesia Post-op Note  Patient: Katrina Meyer  Procedure(s) Performed: Procedure(s): LIGATION OF ARTERIOVENOUS  FISTULA (Left)  Patient Location: PACU  Anesthesia Type:General  Level of Consciousness: awake, alert  and oriented  Airway and Oxygen Therapy: Patient Spontanous Breathing  Post-op Pain: mild  Post-op Assessment: Post-op Vital signs reviewed and Patent Airway  Post-op Vital Signs: stable  Complications: No apparent anesthesia complications

## 2012-03-24 NOTE — Progress Notes (Signed)
D/C'd IVF to blue port of HDC.  Flushed with 10cc NS and 2.1cc Heparin (1,000 units/cc)  Capped and clamped

## 2012-03-24 NOTE — Transfer of Care (Signed)
Immediate Anesthesia Transfer of Care Note  Patient: Katrina Meyer  Procedure(s) Performed: Procedure(s): LIGATION OF ARTERIOVENOUS  FISTULA (Left)  Patient Location: PACU  Anesthesia Type:MAC and General  Level of Consciousness: awake and alert   Airway & Oxygen Therapy: Patient Spontanous Breathing and Patient connected to nasal cannula oxygen  Post-op Assessment: Report given to PACU RN and Post -op Vital signs reviewed and stable  Post vital signs: Reviewed and stable  Complications: No apparent anesthesia complications

## 2012-03-24 NOTE — Progress Notes (Signed)
Call from Dr. Imogene Burn, to line draw through portion of consent for R arm insertion of AVGG.

## 2012-03-24 NOTE — Progress Notes (Signed)
Pt. arrrived today with 2 caregivers. They report that pt. Has rash- arms- (especially R), also neck, back, upper thighs. The rash began upon d/c fr. Hosp. ([facility]) in the past week, but recently has worsened.

## 2012-03-24 NOTE — Anesthesia Preprocedure Evaluation (Signed)
Anesthesia Evaluation  Patient identified by MRN, date of birth, ID band Patient confused    Reviewed: Allergy & Precautions, H&P , NPO status , Patient's Chart, lab work & pertinent test results, reviewed documented beta blocker date and time   Airway Mallampati: II      Dental  (+) Teeth Intact   Pulmonary  breath sounds clear to auscultation        Cardiovascular Rhythm:Regular Rate:Normal     Neuro/Psych    GI/Hepatic   Endo/Other    Renal/GU      Musculoskeletal   Abdominal   Peds  Hematology   Anesthesia Other Findings   Reproductive/Obstetrics                           Anesthesia Physical Anesthesia Plan  ASA: III  Anesthesia Plan: MAC   Post-op Pain Management:    Induction:   Airway Management Planned: Natural Airway and Simple Face Mask  Additional Equipment:   Intra-op Plan:   Post-operative Plan:   Informed Consent: I have reviewed the patients History and Physical, chart, labs and discussed the procedure including the risks, benefits and alternatives for the proposed anesthesia with the patient or authorized representative who has indicated his/her understanding and acceptance.     Plan Discussed with: CRNA and Surgeon  Anesthesia Plan Comments: (ESRD K-3.6 Type 2 DM glucose 159 Parkinsonism Dementia  Plan MAC)        Anesthesia Quick Evaluation

## 2012-03-24 NOTE — H&P (Signed)
[facility] [place]  Brief History and Physical  History of Present Illness  Katrina Meyer is a 79 y.o. female who presents with chief complaint: left arm swelling.  Patient has a known left innominate vein occlusion and has had increased left arm swelling since then.  The patient was evaluated in clinic by Dr. Arbie Cookey and felt to need a RUA AVG due to small sized antecubital veins on venous duplex.  The patient recently develops a rash in the right arm and is using a topical antifungal to manage her right axillary rash.   Past Medical History  Diagnosis Date  . Hyperlipidemia   . Hypertension   . Diabetes mellitus   . DVT (deep venous thrombosis)   . Stroke   . Dementia   . Parkinson's disease   . Tibia/fibula fracture 01/04/2012    Treated with long leg casting. Cast change in hospital 02/27/12   . Skin ulcer of great toe 02/27/2012    Right great toe   . Hematochezia 03/09/2012  . Atrial fibrillation 03/09/2012    Seen on older EKG as well but family not aware.   . Macrocytic anemia 03/09/2012  . Diverticulosis 03/10/2012  . End stage renal disease on dialysis     [facility]- MWF    Past Surgical History  Procedure Laterality Date  . Dg av dialysis graft declot or    . Av fistula repair  Aug. 2013    Left arm  . Cholecystectomy    . Colon surgery      Aprrox 2006 at [facility] after she had a colonoscopy  . Colonoscopy  03/10/2012    Procedure: COLONOSCOPY;  Surgeon: Malissa Hippo, MD;  Location: [facility];  Service: Endoscopy;  Laterality: N/A;  . Esophagogastroduodenoscopy  03/10/2012    Procedure: ESOPHAGOGASTRODUODENOSCOPY (EGD);  Surgeon: Malissa Hippo, MD;  Location: [facility];  Service: Endoscopy;  Laterality: N/A;  EGD if TCS normal.    History   Social History  . Marital Status: Married    Spouse Name: N/A    Number of Children: N/A  . Years of Education: N/A   Occupational History  . Not on file.   Social History Main  Topics  . Smoking status: Passive Smoke Exposure - Never Smoker  . Smokeless tobacco: Never Used  . Alcohol Use: No  . Drug Use: No  . Sexually Active: Not on file   Other Topics Concern  . Not on file   Social History Narrative  . No narrative on file    Family History  Problem Relation Age of Onset  . Stroke Mother   . Diabetes Mother   . Cancer Mother   . Hypertension Mother   . Coronary artery disease Father   . Diabetes Sister     No current facility-administered medications on file prior to encounter.   Current Outpatient Prescriptions on File Prior to Encounter  Medication Sig Dispense Refill  . atorvastatin (LIPITOR) 10 MG tablet Take 1 tablet by mouth Daily.      . carbidopa-levodopa (SINEMET IR) 25-100 MG per tablet Take 1.5 tablets by mouth 3 (three) times daily.       Marland Kitchen donepezil (ARICEPT) 10 MG tablet Take 1 tablet by mouth Daily.      . fexofenadine (ALLEGRA) 180 MG tablet Take 180 mg by mouth daily.      Marland Kitchen FLUoxetine (PROZAC) 40 MG capsule Take 1 capsule by mouth daily.       Marland Kitchen  insulin glargine (LANTUS) 100 UNIT/ML injection Inject 5 Units into the skin at bedtime.       . insulin lispro (HUMALOG) 100 UNIT/ML injection Inject into the skin 3 (three) times daily before meals. As directed per sliding scale      . levothyroxine (SYNTHROID, LEVOTHROID) 25 MCG tablet Take 25 mcg by mouth every morning.      . loperamide (IMODIUM) 2 MG capsule Take 2 mg by mouth daily as needed. For anti-diarrheal      . multivitamin (RENA-VIT) TABS tablet Take 1 tablet by mouth daily.        No Known Allergies  Review of Systems: As listed above, otherwise negative.  Physical Examination  Filed Vitals:   03/24/12 0758 03/24/12 0838  BP: 173/83   Pulse: 62   Temp: 97.4 F (36.3 C)   TempSrc: Oral   Resp: 17   Weight:  164 lb 10.9 oz (74.7 kg)  SpO2: 97%     General: A&O x 3, WDWN  Pulmonary: Sym exp, good air movt, CTAB, no rales, rhonchi, & wheezing, RIJ TDC in  place  Cardiac: RRR, Nl S1, S2, no Murmurs, rubs or gallops  Gastrointestinal: soft, NTND, -G/R, - HSM, - masses, - CVAT B  Musculoskeletal: M/S 5/5 throughout , Extremities without ischemic changes , L forearm thrill, no rash on this arm, R antecubitum with papular rash and R axilla with diffuse scaly rash consistent with fungal infection  Laboratory See iStat  Medical Decision Making  Katrina Meyer is a 79 y.o. female who presents with: Left innominate vein occlusion with left arm swelling, L arm fungal infection.  Based on exam, I recommended ligating the left arm fistula and rescheduling the RUA AVG placement as the fungal infection overlies the venous target for a graft.  Risk, benefits, and alternatives to access surgery were discussed.  The patient is aware the risks include but are not limited to: bleeding, infection, steal syndrome, nerve damage, ischemic monomelic neuropathy, failure to mature, and need for additional procedures.  The patient is aware of the risks and agrees to proceed.  Leonides Sake, MD [facility] [place] Office: [phone] Pager: [phone]  03/24/2012, 9:02 AM

## 2012-03-24 NOTE — Op Note (Signed)
OPERATIVE NOTE   PROCEDURE: 1. Ligation of left radiocephalic arteriovenous fistula  2. Ligation of left basilic vein transposition   PRE-OPERATIVE DIAGNOSIS: end stage renal disease, left innominate vein occlusion  POST-OPERATIVE DIAGNOSIS: same as above   SURGEON: Leonides Sake, MD  ASSISTANT(S): Lianne Cure, PAC   ANESTHESIA: general  ESTIMATED BLOOD LOSS: 100 cc  FINDING(S): 1. Functional left radiocephalic fistula due failed ligation of fistula 2. Aneurysmal degeneration of fistula just proximal to prior ligation 3. Dopplerable radial signal after ligation fistulas 4. No further thrill in left arm  SPECIMEN(S):  none  INDICATIONS:   Katrina Meyer is a 79 y.o. female who presents with left arm swelling in the setting of left innominate vein occlusion.  She was scheduled for ligation of left arm fistula, placed by different practice, and placement of right upper arm graft.  However, the patient had active fungal infections in both axillae, so I canceled that portion of the case.  Risk, benefits, and alternatives to access surgery were discussed.  The patient is aware the risks include but are not limited to: bleeding, infection, nerve damage, need for additional procedures, death and stroke.  The patient agrees to proceed forward with the procedure.  DESCRIPTION: After obtaining full informed written consent, the patient was brought back to the operating room and placed supine upon the operating table.  The patient received IV antibiotics prior to induction.  After obtaining adequate anesthesia, the patient was prepped and draped in the standard fashion for: left arm access procedure.  Under sonosite guidance, I marked the location for the basilic vein transposition near its anastomosis and the radiocephalic arteriovenous fistula near its anastomosis.  Note, I did not feel an upper arm thrill through there was a forearm thrill.  I injected a total of 30 mL of a 1:1 mixture of 1%  Lidocaine with epinephrine and 0.5% marcaine without epinephrine.  I dissected out the basilic vein transposition with some difficult and placed a vessel loop around the fistula to tag the fistula.  I then turned my attention to the forearm.  I made an incision over the fistula and dissected down to the fistula.  Immediately encountered a large aneurysmal segment of the fistula.  Upon further dissection, the aneurysm ruptured.  I held present and gave the patient 3000 unit of Heparin.  A upper arm tourniquet was applied and after waiting five minutes, the tourniquet was inflated to 250 mm Hg pressure after exsanguinating the arm with an Esmark bandage.  I opened the aneurysm sharply and was able to identify the distal fistula anastomosed to the radial artery.  I oversewed this distal segment with 5-0 Prolene.  At this point, the tourniquet was dropped.  I repaired a few holes which continued to bleeding with a 5-0 Prolene.  At this point, no further active bleeding was present.  I identified two loose 2-0 silk ties.  It was apparent that someone had previously tied this fistula inadequately and not transected the fistula.  I also suture ligated the fistula distal to the tied segment with a 5-0 Prolene.  Thrombin and gelfoam was placed in this wound.  There remained a radial pulse in the wound.  Distally, however, I only got a radial signal.  At this point, I turned my attention to the upper arm.  The basilic vein transposition was tied off proximally and distally in the incision with 2-0 Silk and then transected.  I placed thrombin and gelfoam in this wound.  After  waiting a few minutes, both incisions were washed out and no active bleeding was present.  The subcutaneous tissue in both incisions was reapproximated with 3-0 Vicryl.  The skin in both incisions was then reapproximated with a running subcuticular of 4-0 Vicryl.  The skin was cleaned, dried, and reinforced with Dermabond at both  incisions.  COMPLICATIONS: none  CONDITION: stable  Leonides Sake, MD [facility] [place] Office: [phone] Pager: [phone]  03/24/2012, 12:03 PM

## 2012-03-24 NOTE — Progress Notes (Signed)
Multiple calls to dialysis center for recent weight. On hold for extended time.

## 2012-03-24 NOTE — Progress Notes (Signed)
Patient PCR positve for staph and MRSA; teaching done for patient and caregiver re: mupirocin ointment and instructions re; app;ication; handwashing teaching done.

## 2012-03-25 ENCOUNTER — Encounter: Payer: Medicare Other | Admitting: Cardiology

## 2012-03-26 ENCOUNTER — Encounter (INDEPENDENT_AMBULATORY_CARE_PROVIDER_SITE_OTHER): Payer: Self-pay

## 2012-03-26 ENCOUNTER — Encounter: Payer: Self-pay | Admitting: Cardiovascular Disease

## 2012-03-26 ENCOUNTER — Ambulatory Visit (INDEPENDENT_AMBULATORY_CARE_PROVIDER_SITE_OTHER): Payer: Medicare Other | Admitting: Cardiovascular Disease

## 2012-03-26 VITALS — BP 98/62 | HR 64 | Wt 170.8 lb

## 2012-03-26 DIAGNOSIS — I4891 Unspecified atrial fibrillation: Secondary | ICD-10-CM

## 2012-03-26 DIAGNOSIS — G20 Parkinson's disease: Secondary | ICD-10-CM

## 2012-03-26 DIAGNOSIS — S82209A Unspecified fracture of shaft of unspecified tibia, initial encounter for closed fracture: Secondary | ICD-10-CM

## 2012-03-26 DIAGNOSIS — I10 Essential (primary) hypertension: Secondary | ICD-10-CM

## 2012-03-26 DIAGNOSIS — S82409A Unspecified fracture of shaft of unspecified fibula, initial encounter for closed fracture: Secondary | ICD-10-CM

## 2012-03-26 NOTE — Progress Notes (Signed)
Patient ID: Katrina Meyer, female   DOB: [birth year], 79 y.o.   MRN: [id number] 79 yo patient of Dr Dietrich Pates.  Seen in hospital a few weeks ago.  Short episode of PAF.  BP was up and started on hydralazine.  Has CRF and yesterday graft in LUE tied off and has catheter in left subclavian.  No chest pain palpitations or dyspnea.  Has horrible rash likely related to hydralazine.  Echo in hospital showed EF 55-60%  With severe LVH and no significant valve disease.  She has a broken right fibula and sees [place] but this has hindered her ambulation.  Has 3 care takers at home and husband and they make sure her meds are correct  [place] score is 5 and history of GI bleed and unsteady gait due to Parkinsons Decision by Dr Daleen Squibb and Dietrich Pates not to put on coumadin  ROS: Denies fever, malais, weight loss, blurry vision, decreased visual acuity, cough, sputum, SOB, hemoptysis, pleuritic pain, palpitaitons, heartburn, abdominal pain, melena, lower extremity edema, claudication, or rash.  All other systems reviewed and negative  General: Affect appropriate Masked facies elderly female in wheelchair HEENT: normal Neck supple with no adenopathy JVP normal no bruits no thyromegaly Lungs clear with no wheezing and good diaphragmatic motion Heart:  S1/S2 no murmur, no rub, gallop or click PMI normal Abdomen: benighn, BS positve, no tenderness, no AAA no bruit.  No HSM or HJR Distal pulses intact with no bruits No edema Neuro non-focal tardokinesia Skin warm and dry Diffuse erythematous rash on neck back and chest Soft cast on LLE   Current Outpatient Prescriptions  Medication Sig Dispense Refill  . atorvastatin (LIPITOR) 10 MG tablet Take 1 tablet by mouth Daily.      . calcium acetate, Phos Binder, (PHOSLYRA) 667 MG/5ML SOLN Take 667-1,334 mg by mouth 3 (three) times daily with meals. Take 10 milliliters (ML) (1334mg  total)  with meals and take 5 milliliters (ML) (667mg  total) with snacks      .  carbidopa-levodopa (SINEMET IR) 25-100 MG per tablet Take 1.5 tablets by mouth 3 (three) times daily.       . carvedilol (COREG) 12.5 MG tablet Take 1 tablet (12.5 mg total) by mouth 2 (two) times daily with a meal.  60 tablet  0  . donepezil (ARICEPT) 10 MG tablet Take 1 tablet by mouth Daily.      . fexofenadine (ALLEGRA) 180 MG tablet Take 180 mg by mouth daily.      Marland Kitchen FLUoxetine (PROZAC) 40 MG capsule Take 1 capsule by mouth daily.       . hydrALAZINE (APRESOLINE) 25 MG tablet Take 1 tablet (25 mg total) by mouth every 8 (eight) hours.  90 tablet  0  . HYDROcodone-acetaminophen (NORCO/VICODIN) 5-325 MG per tablet Take 1 tablet by mouth every 4 (four) hours as needed for pain.  30 tablet  0  . insulin glargine (LANTUS) 100 UNIT/ML injection Inject 5 Units into the skin at bedtime.       . insulin lispro (HUMALOG) 100 UNIT/ML injection Inject into the skin 3 (three) times daily before meals. As directed per sliding scale      . levothyroxine (SYNTHROID, LEVOTHROID) 25 MCG tablet Take 25 mcg by mouth every morning.      . loperamide (IMODIUM) 2 MG capsule Take 2 mg by mouth daily as needed. For anti-diarrheal      . multivitamin (RENA-VIT) TABS tablet Take 1 tablet by mouth daily.  No current facility-administered medications for this visit.    Allergies  Review of patient's allergies indicates no known allergies.  Electrocardiogram:  03/10/12  Afib rate 110 LVH  Assessment and Plan

## 2012-03-26 NOTE — Assessment & Plan Note (Signed)
F/U neuro seems very stiff and tardokinetic  ? Adjust meds

## 2012-03-26 NOTE — Patient Instructions (Addendum)
Your physician recommends that you schedule a follow-up appointment in: 3 months.  

## 2012-03-26 NOTE — Assessment & Plan Note (Signed)
In NSR no coumadin with ulcerated polyps and rectal bleeding  EF normal

## 2012-03-26 NOTE — Assessment & Plan Note (Signed)
Stop hydralazine due to rash.  Renal to follow BP during dialysis

## 2012-03-26 NOTE — Assessment & Plan Note (Signed)
F/U Dr Otelia Sergeant has soft cast on it now  PT/OT

## 2012-04-06 ENCOUNTER — Other Ambulatory Visit: Payer: Self-pay | Admitting: *Deleted

## 2012-04-15 ENCOUNTER — Encounter (HOSPITAL_COMMUNITY): Payer: Self-pay | Admitting: Pharmacy Technician

## 2012-04-20 ENCOUNTER — Encounter (HOSPITAL_COMMUNITY): Payer: Self-pay

## 2012-04-20 MED ORDER — DEXTROSE 5 % IV SOLN
1.5000 g | INTRAVENOUS | Status: AC
  Administered 2012-04-28: 1.5 g via INTRAVENOUS
  Filled 2012-04-20 (×2): qty 1.5

## 2012-04-20 MED ORDER — SODIUM CHLORIDE 0.9 % IV SOLN
INTRAVENOUS | Status: DC
  Administered 2012-04-28: 13:00:00 via INTRAVENOUS

## 2012-04-24 ENCOUNTER — Encounter (HOSPITAL_COMMUNITY): Payer: Self-pay | Admitting: *Deleted

## 2012-04-28 ENCOUNTER — Encounter (HOSPITAL_COMMUNITY): Payer: Self-pay | Admitting: *Deleted

## 2012-04-28 ENCOUNTER — Ambulatory Visit (HOSPITAL_COMMUNITY): Payer: Medicare Other | Admitting: Anesthesiology

## 2012-04-28 ENCOUNTER — Encounter (HOSPITAL_COMMUNITY)
Admission: RE | Disposition: A | Discharge status: Home / Self Care | Payer: Self-pay | Source: Ambulatory Visit | Attending: Vascular Surgery

## 2012-04-28 ENCOUNTER — Ambulatory Visit (HOSPITAL_COMMUNITY)
Admission: RE | Admit: 2012-04-28 | Discharge: 2012-04-28 | Disposition: A | Discharge status: Home / Self Care | Payer: Medicare Other | Source: Ambulatory Visit | Attending: Vascular Surgery | Admitting: Vascular Surgery

## 2012-04-28 ENCOUNTER — Ambulatory Visit: Payer: Medicare Other | Admitting: Neurosurgery

## 2012-04-28 ENCOUNTER — Encounter (HOSPITAL_COMMUNITY): Payer: Self-pay | Admitting: Anesthesiology

## 2012-04-28 DIAGNOSIS — I4891 Unspecified atrial fibrillation: Secondary | ICD-10-CM | POA: Insufficient documentation

## 2012-04-28 DIAGNOSIS — Z86718 Personal history of other venous thrombosis and embolism: Secondary | ICD-10-CM | POA: Insufficient documentation

## 2012-04-28 DIAGNOSIS — Z888 Allergy status to other drugs, medicaments and biological substances status: Secondary | ICD-10-CM | POA: Insufficient documentation

## 2012-04-28 DIAGNOSIS — F3289 Other specified depressive episodes: Secondary | ICD-10-CM | POA: Insufficient documentation

## 2012-04-28 DIAGNOSIS — E785 Hyperlipidemia, unspecified: Secondary | ICD-10-CM | POA: Insufficient documentation

## 2012-04-28 DIAGNOSIS — E119 Type 2 diabetes mellitus without complications: Secondary | ICD-10-CM | POA: Insufficient documentation

## 2012-04-28 DIAGNOSIS — E039 Hypothyroidism, unspecified: Secondary | ICD-10-CM | POA: Insufficient documentation

## 2012-04-28 DIAGNOSIS — D539 Nutritional anemia, unspecified: Secondary | ICD-10-CM | POA: Insufficient documentation

## 2012-04-28 DIAGNOSIS — Z992 Dependence on renal dialysis: Secondary | ICD-10-CM | POA: Insufficient documentation

## 2012-04-28 DIAGNOSIS — I120 Hypertensive chronic kidney disease with stage 5 chronic kidney disease or end stage renal disease: Secondary | ICD-10-CM | POA: Insufficient documentation

## 2012-04-28 DIAGNOSIS — Z79899 Other long term (current) drug therapy: Secondary | ICD-10-CM | POA: Insufficient documentation

## 2012-04-28 DIAGNOSIS — F329 Major depressive disorder, single episode, unspecified: Secondary | ICD-10-CM | POA: Insufficient documentation

## 2012-04-28 DIAGNOSIS — N186 End stage renal disease: Secondary | ICD-10-CM

## 2012-04-28 DIAGNOSIS — I82291 Chronic embolism and thrombosis of other thoracic veins: Secondary | ICD-10-CM | POA: Insufficient documentation

## 2012-04-28 DIAGNOSIS — Z8673 Personal history of transient ischemic attack (TIA), and cerebral infarction without residual deficits: Secondary | ICD-10-CM | POA: Insufficient documentation

## 2012-04-28 DIAGNOSIS — F0280 Dementia in other diseases classified elsewhere without behavioral disturbance: Secondary | ICD-10-CM | POA: Insufficient documentation

## 2012-04-28 DIAGNOSIS — G3183 Dementia with Lewy bodies: Secondary | ICD-10-CM | POA: Insufficient documentation

## 2012-04-28 HISTORY — DX: Major depressive disorder, single episode, unspecified: F32.9

## 2012-04-28 HISTORY — DX: Hypothyroidism, unspecified: E03.9

## 2012-04-28 HISTORY — PX: AV FISTULA PLACEMENT: SHX1204

## 2012-04-28 HISTORY — DX: Other seasonal allergic rhinitis: J30.2

## 2012-04-28 HISTORY — DX: Depression, unspecified: F32.A

## 2012-04-28 LAB — POCT I-STAT 4, (NA,K, GLUC, HGB,HCT)
Glucose, Bld: 153 mg/dL — ABNORMAL HIGH (ref 70–99)
HCT: 40 % (ref 36.0–46.0)
Hemoglobin: 13.6 g/dL (ref 12.0–15.0)
Potassium: 3.9 mEq/L (ref 3.5–5.1)
Sodium: 142 mEq/L (ref 135–145)

## 2012-04-28 LAB — GLUCOSE, CAPILLARY
Glucose-Capillary: 124 mg/dL — ABNORMAL HIGH (ref 70–99)
Glucose-Capillary: 106 mg/dL — ABNORMAL HIGH (ref 70–99)

## 2012-04-28 SURGERY — INSERTION OF ARTERIOVENOUS (AV) GORE-TEX GRAFT ARM
Anesthesia: General | Site: Arm Upper | Laterality: Right | Wound class: Clean

## 2012-04-28 MED ORDER — OXYCODONE HCL 5 MG PO TABS: 5.0000 mg | ORAL_TABLET | Freq: Once | ORAL | Status: DC | PRN

## 2012-04-28 MED ORDER — ONDANSETRON HCL 4 MG/2ML IJ SOLN: 4.0000 mg | Freq: Once | INTRAMUSCULAR | Status: DC | PRN

## 2012-04-28 MED ORDER — ARTIFICIAL TEARS OP OINT
TOPICAL_OINTMENT | OPHTHALMIC | Status: DC | PRN
  Administered 2012-04-28: 1 via OPHTHALMIC

## 2012-04-28 MED ORDER — ONDANSETRON HCL 4 MG/2ML IJ SOLN
INTRAMUSCULAR | Status: DC | PRN
  Administered 2012-04-28: 4 mg via INTRAVENOUS

## 2012-04-28 MED ORDER — HYDROCODONE-ACETAMINOPHEN 5-325 MG PO TABS: 1.0000 | ORAL_TABLET | ORAL | Status: DC | PRN

## 2012-04-28 MED ORDER — HYDROMORPHONE HCL PF 1 MG/ML IJ SOLN: 0.2500 mg | INTRAMUSCULAR | Status: DC | PRN

## 2012-04-28 MED ORDER — SODIUM CHLORIDE 0.9 % IR SOLN
Status: DC | PRN
  Administered 2012-04-28: 14:00:00

## 2012-04-28 MED ORDER — THROMBIN 20000 UNITS EX SOLR
CUTANEOUS | Status: AC
  Filled 2012-04-28: qty 20000

## 2012-04-28 MED ORDER — PHENYLEPHRINE HCL 10 MG/ML IJ SOLN
INTRAMUSCULAR | Status: DC | PRN
  Administered 2012-04-28 (×2): 80 ug via INTRAVENOUS

## 2012-04-28 MED ORDER — MEPERIDINE HCL 25 MG/ML IJ SOLN: 6.2500 mg | INTRAMUSCULAR | Status: DC | PRN

## 2012-04-28 MED ORDER — PHENYLEPHRINE HCL 10 MG/ML IJ SOLN
10.0000 mg | INTRAVENOUS | Status: DC | PRN
  Administered 2012-04-28: 25 ug/min via INTRAVENOUS

## 2012-04-28 MED ORDER — HEPARIN SODIUM (PORCINE) 1000 UNIT/ML IJ SOLN
INTRAMUSCULAR | Status: DC | PRN
  Administered 2012-04-28: 5000 [IU] via INTRAVENOUS

## 2012-04-28 MED ORDER — PROTAMINE SULFATE 10 MG/ML IV SOLN
INTRAVENOUS | Status: DC | PRN
  Administered 2012-04-28 (×5): 10 mg via INTRAVENOUS

## 2012-04-28 MED ORDER — PROPOFOL 10 MG/ML IV BOLUS
INTRAVENOUS | Status: DC | PRN
  Administered 2012-04-28: 120 mg via INTRAVENOUS

## 2012-04-28 MED ORDER — OXYCODONE HCL 5 MG/5ML PO SOLN: 5.0000 mg | Freq: Once | ORAL | Status: DC | PRN

## 2012-04-28 MED ORDER — LIDOCAINE HCL (CARDIAC) 20 MG/ML IV SOLN
INTRAVENOUS | Status: DC | PRN
  Administered 2012-04-28: 100 mg via INTRAVENOUS

## 2012-04-28 MED ORDER — FENTANYL CITRATE 0.05 MG/ML IJ SOLN
INTRAMUSCULAR | Status: DC | PRN
  Administered 2012-04-28 (×2): 50 ug via INTRAVENOUS

## 2012-04-28 MED ORDER — MUPIROCIN 2 % EX OINT
TOPICAL_OINTMENT | CUTANEOUS | Status: AC
  Administered 2012-04-28: 1 via NASAL
  Filled 2012-04-28: qty 22

## 2012-04-28 MED ORDER — 0.9 % SODIUM CHLORIDE (POUR BTL) OPTIME
TOPICAL | Status: DC | PRN
  Administered 2012-04-28: 1000 mL

## 2012-04-28 SURGICAL SUPPLY — 36 items
CANISTER SUCTION 2500CC (MISCELLANEOUS) ×2 IMPLANT
CLIP TI MEDIUM 6 (CLIP) ×2 IMPLANT
CLIP TI WIDE RED SMALL 6 (CLIP) ×2 IMPLANT
CLOTH BEACON ORANGE TIMEOUT ST (SAFETY) ×2 IMPLANT
COVER SURGICAL LIGHT HANDLE (MISCELLANEOUS) ×2 IMPLANT
DECANTER SPIKE VIAL GLASS SM (MISCELLANEOUS) ×2 IMPLANT
DERMABOND ADVANCED (GAUZE/BANDAGES/DRESSINGS) ×1
DERMABOND ADVANCED .7 DNX12 (GAUZE/BANDAGES/DRESSINGS) ×1 IMPLANT
ELECT REM PT RETURN 9FT ADLT (ELECTROSURGICAL) ×2
ELECTRODE REM PT RTRN 9FT ADLT (ELECTROSURGICAL) ×1 IMPLANT
GEL ULTRASOUND 20GR AQUASONIC (MISCELLANEOUS) IMPLANT
GLOVE BIO SURGEON STRL SZ7.5 (GLOVE) ×2 IMPLANT
GLOVE BIOGEL PI IND STRL 6.5 (GLOVE) ×1 IMPLANT
GLOVE BIOGEL PI INDICATOR 6.5 (GLOVE) ×1
GLOVE ECLIPSE 6.5 STRL STRAW (GLOVE) ×2 IMPLANT
GLOVE SURG SS PI 7.0 STRL IVOR (GLOVE) ×2 IMPLANT
GOWN PREVENTION PLUS XLARGE (GOWN DISPOSABLE) ×2 IMPLANT
GOWN STRL NON-REIN LRG LVL3 (GOWN DISPOSABLE) ×4 IMPLANT
GOWN STRL REIN XL XLG (GOWN DISPOSABLE) ×2 IMPLANT
GRAFT GORETEX STRT 4-7X45 (Vascular Products) ×2 IMPLANT
KIT BASIN OR (CUSTOM PROCEDURE TRAY) ×2 IMPLANT
KIT ROOM TURNOVER OR (KITS) ×2 IMPLANT
LOOP VESSEL MINI RED (MISCELLANEOUS) IMPLANT
NS IRRIG 1000ML POUR BTL (IV SOLUTION) ×2 IMPLANT
PACK CV ACCESS (CUSTOM PROCEDURE TRAY) ×2 IMPLANT
PAD ARMBOARD 7.5X6 YLW CONV (MISCELLANEOUS) ×4 IMPLANT
SPONGE SURGIFOAM ABS GEL 100 (HEMOSTASIS) IMPLANT
SUT PROLENE 6 0 CC (SUTURE) ×6 IMPLANT
SUT PROLENE 7 0 BV 1 (SUTURE) ×2 IMPLANT
SUT VIC AB 3-0 SH 27 (SUTURE) ×2
SUT VIC AB 3-0 SH 27X BRD (SUTURE) ×2 IMPLANT
SUT VICRYL 4-0 PS2 18IN ABS (SUTURE) ×4 IMPLANT
TOWEL OR 17X24 6PK STRL BLUE (TOWEL DISPOSABLE) ×2 IMPLANT
TOWEL OR 17X26 10 PK STRL BLUE (TOWEL DISPOSABLE) ×2 IMPLANT
UNDERPAD 30X30 INCONTINENT (UNDERPADS AND DIAPERS) ×2 IMPLANT
WATER STERILE IRR 1000ML POUR (IV SOLUTION) ×2 IMPLANT

## 2012-04-28 NOTE — Op Note (Signed)
Procedure: Right Upper Arm AV graft  Preop: ESRD  Postop: ESRD  Anesthesia: General  Findings:4-7 mm PTFE end to end to axillary vein  Asst: Lianne Cure, PA-C   Procedure Details: The right upper extremity was prepped and draped in usual sterile fashion.  A longitudinal incision was then made near the antecubital crease the right arm.  There were no suitable antecubital veins for outflow.   The incision was carried into the subcutaneous tissues down to level of the brachial artery.  There was a preexisting basilic vein fistula which was chronically occluded.  This was dissected free circumferentially.  Next the brachial artery was dissected free in the medial portion incision. The artery was 3 mm in diameter. The vessel loops were placed proximal and distal to the planned site of arteriotomy. At this point, a longitudinal incision was made in the axilla and carried through the subcutaneous tissues and fascia to expose the axillary vein.  The nerves were protected.  The vein was approximately 4-5 mm in diameter. Next, a subcutaneous tunnel was created connecting the upper arm to the lower arm incision in an arcing configuration over the biceps muscle.  A 4-7 mm PTFE graft was then brought through this subcutaneous tunnel. The patient was given 5000 units of intravenous heparin. After appropriate circulation time, the vessel loops were used to control the artery. The fistula was taken down and the old arteriotomy used as a donor site.  The 4 mm end of the graft was beveled and sewn end to side to the artery using a 6 0 prolene.  At completion of the anastomosis the artery was forward bled, backbled and thoroughly flushed.  The anastomosis was secured, vessel loops were released and there was palpable pulse in the graft.  The graft was clamped just above the arterial anastomosis with a fistula clamp. The graft was then pulled taut to length at the axillary incision.  The axillary vein was controlled  with a fine bulldog clamp in the upper axilla.  The vein was transected and spatulated.  The distal end of the graft was then beveled and sewn end to end to the vein using a running 6 0 prolene.  Just prior to completion of the anastomosis, everything was forward bled, back bled and thoroughly flushed.  The anastomosis was secured and the fistula clamp removed from the proximal graft.  A thrill was immediately palpable in the graft. The patient was given 50 mg of protamine to assist with hemostasis.  After hemostasis was obtained, the subcutaneous tissues were reapproximated using a running 3-0 Vicryl suture. The skin was then closed with a 4 0 Vicryl subcuticular stitch. Dermabond was applied to the skin incisions.  The patient tolerated the procedure well and there were no complications.  Instrument sponge and needle count was correct at the end of the case.  The patient was taken to the recovery room in stable condition. The patient had audible radial and ulnar doppler signals at the end of the case.  Fabienne Bruns, MD [facility] [place] Office: [phone] Pager: [phone]

## 2012-04-28 NOTE — H&P (Signed)
History and Physical  VVS  79 y/o female  has a known left innominate vein occlusion.  The patient was evaluated in clinic by Dr. Arbie Cookey and felt to need a RUA AVG due to small sized antecubital veins on venous duplex. The patient recently develops a rash in the right arm and is using a topical antifungal to manage her right axillary rash. She was rescheduled for today for right arm AV graft.  Has had prior ligation of fistula left arm for venous hypertension  Past Medical History   Diagnosis  Date   .  Hyperlipidemia    .  Hypertension    .  Diabetes mellitus    .  DVT (deep venous thrombosis)    .  Stroke    .  Dementia    .  Parkinson's disease    .  Tibia/fibula fracture  01/04/2012     Treated with long leg casting. Cast change in hospital 02/27/12   .  Skin ulcer of great toe  02/27/2012     Right great toe   .  Hematochezia  03/09/2012   .  Atrial fibrillation  03/09/2012     Seen on older EKG as well but family not aware.   .  Macrocytic anemia  03/09/2012   .  Diverticulosis  03/10/2012   .  End stage renal disease on dialysis      [facility]- MWF    Past Surgical History   Procedure  Laterality  Date   .  Dg av dialysis graft declot or     .  Av fistula repair   Aug. 2013     Left arm   .  Cholecystectomy     .  Colon surgery       Aprrox 2006 at [facility] after she had a colonoscopy   .  Colonoscopy   03/10/2012     Procedure: COLONOSCOPY; Surgeon: Malissa Hippo, MD; [facility]; Service: Endoscopy; Laterality: N/A;   .  Esophagogastroduodenoscopy   03/10/2012     Procedure: ESOPHAGOGASTRODUODENOSCOPY (EGD); Surgeon: Malissa Hippo, MD; Location[facility]; Service: Endoscopy; Laterality: N/A; EGD if TCS normal.     Family History   Problem  Relation  Age of Onset   .  Stroke  Mother    .  Diabetes  Mother    .  Cancer  Mother    .  Hypertension  Mother    .  Coronary artery disease  Father    .  Diabetes  Sister       Allergies  Allergen  Reactions  . Hydralazine Rash   Physical Examination  Filed Vitals:   04/24/12 1419 04/28/12 0839 04/28/12 0942 04/28/12 1147  BP:  150/95 202/84 174/75  Pulse:  60 64   Temp:  97.5 F (36.4 C)    TempSrc:  Oral    Resp:  18    Height: 5\' 7"  (1.702 m)     Weight: 170 lb (77.111 kg)     SpO2:  98%     General: Black female no distress follow commands  Pulmonary: Sym exp, good air movt, CTAB, no rales, rhonchi, & wheezing, RIJ TDC in place  Cardiac: RRR Musculoskeletal: M/S 5/5 throughout , Extremities without ischemic changes, right arm 2+ Radial and brachial pulse, well healed right upper arm scar from what looks like prior basilic vein transposition  Assessment: needs hemodialysis access Plan: Right upper arm graft today.  Procedure risk  benefit discussed with pt and daughter.  Fabienne Bruns, MD [facility] [place] Office: [phone] Pager: [phone]

## 2012-04-28 NOTE — Anesthesia Procedure Notes (Signed)
Procedure Name: LMA Insertion Date/Time: 04/28/2012 1:34 PM Performed by: Gayla Medicus Pre-anesthesia Checklist: Patient identified, Timeout performed, Emergency Drugs available, Suction available and Patient being monitored Patient Re-evaluated:Patient Re-evaluated prior to inductionOxygen Delivery Method: Circle system utilized Preoxygenation: Pre-oxygenation with 100% oxygen Intubation Type: IV induction LMA: LMA inserted LMA Size: 4.0 Number of attempts: 1 Placement Confirmation: positive ETCO2 and breath sounds checked- equal and bilateral Tube secured with: Tape Dental Injury: Teeth and Oropharynx as per pre-operative assessment

## 2012-04-28 NOTE — Progress Notes (Signed)
Pt charted on for entire chart by Chalmers Cater

## 2012-04-28 NOTE — Transfer of Care (Signed)
Immediate Anesthesia Transfer of Care Note  Patient: Katrina Meyer  Procedure(s) Performed: Procedure(s): INSERTION OF ARTERIOVENOUS (AV) GORE-TEX GRAFT ARM (Right)  Patient Location: PACU  Anesthesia Type:General  Level of Consciousness: awake and alert   Airway & Oxygen Therapy: Patient Spontanous Breathing and Patient connected to face mask oxygen  Post-op Assessment: Report given to PACU RN and Post -op Vital signs reviewed and stable  Post vital signs: Reviewed and stable  Complications: No apparent anesthesia complications

## 2012-04-28 NOTE — Progress Notes (Signed)
This note also relates to the following rows which could not be included: CBG Lab Component - View only - Cannot attach notes to extension rows    

## 2012-04-28 NOTE — Preoperative (Signed)
Beta Blockers   Reason not to administer Beta Blockers:Not Applicable 

## 2012-04-28 NOTE — Progress Notes (Signed)
Pt taking sips of diet sprite. Oriented to person , place does not answer when asked what surgery she had done .

## 2012-04-28 NOTE — Progress Notes (Signed)
Pt arrived arousable , mae tc, right leg has an immobilizer on pt able to wiggle toes

## 2012-04-28 NOTE — Anesthesia Postprocedure Evaluation (Signed)
  Anesthesia Post-op Note  Patient: Katrina Meyer  Procedure(s) Performed: Procedure(s): INSERTION OF ARTERIOVENOUS (AV) GORE-TEX GRAFT ARM (Right)  Patient Location: PACU  Anesthesia Type:MAC  Level of Consciousness: awake, alert  and oriented  Airway and Oxygen Therapy: Patient Spontanous Breathing  Post-op Pain: mild  Post-op Assessment: Post-op Vital signs reviewed  Post-op Vital Signs: Reviewed  Complications: No apparent anesthesia complications

## 2012-04-28 NOTE — Anesthesia Preprocedure Evaluation (Addendum)
Anesthesia Evaluation  Patient identified by MRN, date of birth, ID band Patient awake    Reviewed: Allergy & Precautions, H&P , NPO status , Patient's Chart, lab work & pertinent test results, reviewed documented beta blocker date and time   Airway Mallampati: I TM Distance: >3 FB Neck ROM: Full    Dental   Pulmonary          Cardiovascular hypertension, Pt. on medications and Pt. on home beta blockers + Peripheral Vascular Disease and DVT + dysrhythmias Atrial Fibrillation   Echo 03/2012 - LVEF 55-60%   Neuro/Psych PSYCHIATRIC DISORDERS Depression Parkinson's dx; dementia CVA    GI/Hepatic   Endo/Other  Hypothyroidism   Renal/GU ESRF and DialysisRenal disease     Musculoskeletal   Abdominal   Peds  Hematology   Anesthesia Other Findings   Reproductive/Obstetrics                        Anesthesia Physical Anesthesia Plan  ASA: III  Anesthesia Plan: General   Post-op Pain Management:    Induction: Intravenous  Airway Management Planned: LMA  Additional Equipment:   Intra-op Plan:   Post-operative Plan: Extubation in OR  Informed Consent: I have reviewed the patients History and Physical, chart, labs and discussed the procedure including the risks, benefits and alternatives for the proposed anesthesia with the patient or authorized representative who has indicated his/her understanding and acceptance.   Dental advisory given  Plan Discussed with: Surgeon and CRNA  Anesthesia Plan Comments:        Anesthesia Quick Evaluation

## 2012-04-28 NOTE — Progress Notes (Signed)
Care taker at bedside assisted aide and nurse with dressing patient I V saline locked pt denies pain , auscultated and palplated bruit and thrill assisted into wheelchair. Taking sips of diet sprite,.transported to short stay after dressing with chart t

## 2012-04-30 ENCOUNTER — Encounter (HOSPITAL_COMMUNITY): Payer: Self-pay | Admitting: Vascular Surgery

## 2012-05-30 ENCOUNTER — Emergency Department (HOSPITAL_COMMUNITY)
Admission: EM | Admit: 2012-05-30 | Discharge: 2012-05-30 | Disposition: A | Discharge status: Home / Self Care | Payer: Medicare Other | Attending: Emergency Medicine | Admitting: Emergency Medicine

## 2012-05-30 ENCOUNTER — Emergency Department (HOSPITAL_COMMUNITY): Payer: Medicare Other

## 2012-05-30 ENCOUNTER — Encounter (HOSPITAL_COMMUNITY): Payer: Self-pay | Admitting: *Deleted

## 2012-05-30 DIAGNOSIS — Z872 Personal history of diseases of the skin and subcutaneous tissue: Secondary | ICD-10-CM | POA: Insufficient documentation

## 2012-05-30 DIAGNOSIS — R109 Unspecified abdominal pain: Secondary | ICD-10-CM | POA: Insufficient documentation

## 2012-05-30 DIAGNOSIS — F0390 Unspecified dementia without behavioral disturbance: Secondary | ICD-10-CM | POA: Insufficient documentation

## 2012-05-30 DIAGNOSIS — Z862 Personal history of diseases of the blood and blood-forming organs and certain disorders involving the immune mechanism: Secondary | ICD-10-CM | POA: Insufficient documentation

## 2012-05-30 DIAGNOSIS — F0280 Dementia in other diseases classified elsewhere without behavioral disturbance: Secondary | ICD-10-CM | POA: Insufficient documentation

## 2012-05-30 DIAGNOSIS — Z8781 Personal history of (healed) traumatic fracture: Secondary | ICD-10-CM | POA: Insufficient documentation

## 2012-05-30 DIAGNOSIS — K5900 Constipation, unspecified: Secondary | ICD-10-CM | POA: Insufficient documentation

## 2012-05-30 DIAGNOSIS — Z8679 Personal history of other diseases of the circulatory system: Secondary | ICD-10-CM | POA: Insufficient documentation

## 2012-05-30 DIAGNOSIS — Z794 Long term (current) use of insulin: Secondary | ICD-10-CM | POA: Insufficient documentation

## 2012-05-30 DIAGNOSIS — E039 Hypothyroidism, unspecified: Secondary | ICD-10-CM | POA: Insufficient documentation

## 2012-05-30 DIAGNOSIS — G309 Alzheimer's disease, unspecified: Secondary | ICD-10-CM | POA: Insufficient documentation

## 2012-05-30 DIAGNOSIS — I120 Hypertensive chronic kidney disease with stage 5 chronic kidney disease or end stage renal disease: Secondary | ICD-10-CM | POA: Insufficient documentation

## 2012-05-30 DIAGNOSIS — N186 End stage renal disease: Secondary | ICD-10-CM | POA: Insufficient documentation

## 2012-05-30 DIAGNOSIS — F3289 Other specified depressive episodes: Secondary | ICD-10-CM | POA: Insufficient documentation

## 2012-05-30 DIAGNOSIS — F329 Major depressive disorder, single episode, unspecified: Secondary | ICD-10-CM | POA: Insufficient documentation

## 2012-05-30 DIAGNOSIS — Z86718 Personal history of other venous thrombosis and embolism: Secondary | ICD-10-CM | POA: Insufficient documentation

## 2012-05-30 DIAGNOSIS — E119 Type 2 diabetes mellitus without complications: Secondary | ICD-10-CM | POA: Insufficient documentation

## 2012-05-30 DIAGNOSIS — Z79899 Other long term (current) drug therapy: Secondary | ICD-10-CM | POA: Insufficient documentation

## 2012-05-30 DIAGNOSIS — Z8673 Personal history of transient ischemic attack (TIA), and cerebral infarction without residual deficits: Secondary | ICD-10-CM | POA: Insufficient documentation

## 2012-05-30 DIAGNOSIS — Z992 Dependence on renal dialysis: Secondary | ICD-10-CM | POA: Insufficient documentation

## 2012-05-30 DIAGNOSIS — E785 Hyperlipidemia, unspecified: Secondary | ICD-10-CM | POA: Insufficient documentation

## 2012-05-30 DIAGNOSIS — Z8719 Personal history of other diseases of the digestive system: Secondary | ICD-10-CM | POA: Insufficient documentation

## 2012-05-30 LAB — COMPREHENSIVE METABOLIC PANEL
AST: 18 U/L (ref 0–37)
Alkaline Phosphatase: 116 U/L (ref 39–117)
BUN: 17 mg/dL (ref 6–23)
CO2: 31 mEq/L (ref 19–32)
Chloride: 95 mEq/L — ABNORMAL LOW (ref 96–112)
Creatinine, Ser: 3.38 mg/dL — ABNORMAL HIGH (ref 0.50–1.10)
GFR calc non Af Amer: 12 mL/min — ABNORMAL LOW (ref >90)
Total Bilirubin: 0.4 mg/dL (ref 0.3–1.2)

## 2012-05-30 LAB — CBC WITH DIFFERENTIAL/PLATELET
Basophils Absolute: 0 10*3/uL (ref 0.0–0.1)
HCT: 33.7 % — ABNORMAL LOW (ref 36.0–46.0)
Hemoglobin: 10.6 g/dL — ABNORMAL LOW (ref 12.0–15.0)
Lymphocytes Relative: 11 % — ABNORMAL LOW (ref 12–46)
Monocytes Absolute: 1.2 10*3/uL — ABNORMAL HIGH (ref 0.1–1.0)
Monocytes Relative: 12 % (ref 3–12)
Neutro Abs: 7.9 10*3/uL — ABNORMAL HIGH (ref 1.7–7.7)
WBC: 10.5 10*3/uL (ref 4.0–10.5)

## 2012-05-30 MED ORDER — SODIUM CHLORIDE 0.9 % IV BOLUS (SEPSIS)
500.0000 mL | Freq: Once | INTRAVENOUS | Status: AC
  Administered 2012-05-30: 500 mL via INTRAVENOUS

## 2012-05-30 MED ORDER — SENNOSIDES-DOCUSATE SODIUM 8.6-50 MG PO TABS: 2.0000 | ORAL_TABLET | Freq: Every day | ORAL | Status: DC

## 2012-05-30 MED ORDER — FLEET ENEMA 7-19 GM/118ML RE ENEM
1.0000 | ENEMA | Freq: Once | RECTAL | Status: AC
  Administered 2012-05-30: 1 via RECTAL

## 2012-05-30 NOTE — ED Notes (Signed)
Pt will now speak with staff, asking for water, advised to wait until dr has seen pt, caregiver and pt expressed understanding,

## 2012-05-30 NOTE — ED Notes (Signed)
Digitally removed large amount of stool.

## 2012-05-30 NOTE — ED Notes (Addendum)
Pt brought to er by caregiver with c/o constipation, abd pain, not eating or drinking, pt is only moaning in triage, caregiver states that pt will speak at times, it just depends.

## 2012-05-30 NOTE — ED Provider Notes (Signed)
History     CSN: [id number]  Arrival date & time 05/30/12  1631   First MD Initiated Contact with Patient 05/30/12 1655      Chief Complaint  Patient presents with  . Abdominal Pain  . Constipation     HPI  The patient presents with her caregiver who provides the history of present illness.  The patient has dementia, and largely aphasic. Level V caveat. [name] caregiver states that over the past 2 weeks she has had decreased frequency of bowel movements, with non-until yesterday.  Yesterday she had one small hard stool. Patient also seems to be indicated meters abdominal pain. There is no report of new nausea, vomiting. The patient has persistent anorexia, decreased interactivity. No report of fever. The patient has end-stage renal disease, as well as multiple other medical problems. She completed dialysis yesterday. She has a dysrhythmia.   Past Medical History  Diagnosis Date  . Hyperlipidemia     takes Lipitor daily  . DVT (deep venous thrombosis)   . Stroke   . Parkinson's disease     takes Sinemet daily  . Tibia/fibula fracture 01/04/2012    Treated with long leg casting. Cast change in hospital 02/27/12   . Skin ulcer of great toe 02/27/2012    Right great toe   . Hematochezia 03/09/2012  . Atrial fibrillation 03/09/2012    Seen on older EKG as well but family not aware.   . Macrocytic anemia 03/09/2012  . Diverticulosis 03/10/2012  . End stage renal disease on dialysis     [facility]- MWF  . Hypertension     takes Coreg daily  . Dementia     takes Aricept daily  . Diabetes mellitus     takes Lantus and Novolog  . Seasonal allergies     takes Allegra daily  . Depression     takes Prozac daily  . Hypothyroidism     takes Synthroid daily  . Diarrhea     takes Immodium prn    Past Surgical History  Procedure Laterality Date  . Dg av dialysis graft declot or    . Av fistula repair  Aug. 2013    Left arm  . Cholecystectomy    . Colon surgery       Aprrox 2006 at [facility] after she had a colonoscopy  . Colonoscopy  03/10/2012    Procedure: COLONOSCOPY;  Surgeon: Malissa Hippo, MD;  [facility];  Service: Endoscopy;  Laterality: N/A;  . Esophagogastroduodenoscopy  03/10/2012    Procedure: ESOPHAGOGASTRODUODENOSCOPY (EGD);  Surgeon: Malissa Hippo, MD;  Location: [facility];  Service: Endoscopy;  Laterality: N/A;  EGD if TCS normal.  . Ligation of arteriovenous  fistula Left 03/24/2012    Procedure: LIGATION OF ARTERIOVENOUS  FISTULA;  Surgeon: Fransisco Hertz, MD;  Location: [facility] OR;  Service: Vascular;  Laterality: Left;  . Av fistula placement Right 04/28/2012    Procedure: INSERTION OF ARTERIOVENOUS (AV) GORE-TEX GRAFT ARM;  Surgeon: Sherren Kerns, MD;  Location: [facility] OR;  Service: Vascular;  Laterality: Right;    Family History  Problem Relation Age of Onset  . Stroke Mother   . Diabetes Mother   . Cancer Mother   . Hypertension Mother   . Coronary artery disease Father   . Diabetes Sister     History  Substance Use Topics  . Smoking status: Passive Smoke Exposure - Never Smoker  . Smokeless tobacco: Never Used  . Alcohol  Use: No    OB History   Grav Para Term Preterm Abortions TAB SAB Ect Mult Living                  Review of Systems  Unable to perform ROS: Patient nonverbal    Allergies  Hydralazine  Home Medications   Current Outpatient Rx  Name  Route  Sig  Dispense  Refill  . amLODipine (NORVASC) 5 MG tablet   Oral   Take 5 mg by mouth daily.         Marland Kitchen atorvastatin (LIPITOR) 10 MG tablet   Oral   Take 10 mg by mouth daily.          . carbidopa-levodopa (SINEMET IR) 25-100 MG per tablet   Oral   Take 1.5 tablets by mouth 3 (three) times daily.         . carvedilol (COREG) 6.25 MG tablet   Oral   Take 6.25 mg by mouth 2 (two) times daily with a meal.         . donepezil (ARICEPT) 10 MG tablet   Oral   Take 10 mg by mouth daily.         Marland Kitchen FLUoxetine (PROZAC) 40 MG  capsule   Oral   Take 40 mg by mouth daily.         Marland Kitchen glipiZIDE (GLUCOTROL XL) 10 MG 24 hr tablet   Oral   Take 10 mg by mouth daily.         Marland Kitchen HYDROcodone-acetaminophen (NORCO/VICODIN) 5-325 MG per tablet   Oral   Take 1 tablet by mouth every 4 (four) hours as needed (for pain associated with broken tibula).   30 tablet   0   . insulin aspart (NOVOLOG) 100 UNIT/ML injection   Subcutaneous   Inject 0-3 Units into the skin 3 (three) times daily before meals.         . insulin glargine (LANTUS) 100 UNIT/ML injection   Subcutaneous   Inject 5 Units into the skin at bedtime.          Marland Kitchen levothyroxine (SYNTHROID, LEVOTHROID) 25 MCG tablet   Oral   Take 25 mcg by mouth every morning.         . multivitamin (RENA-VIT) TABS tablet   Oral   Take 1 tablet by mouth daily.         . ramipril (ALTACE) 5 MG capsule   Oral   Take 5-10 mg by mouth daily. Patient takes 2 capsules in the morning and 1 capsule in the evening         . loperamide (IMODIUM) 2 MG capsule   Oral   Take 2 mg by mouth daily as needed for diarrhea or loose stools.         Marland Kitchen OVER THE COUNTER MEDICATION   Oral   Take 10 mLs by mouth daily as needed (Diabetic cough medication.).         Marland Kitchen OVER THE COUNTER MEDICATION   Topical   Apply 1 application topically daily as needed (Diaper rash cream.).           BP 156/89  Pulse 66  Temp(Src) 97.4 F (36.3 C) (Oral)  Resp 16  Ht 5\' 7"  (1.702 m)  Wt 151 lb (68.493 kg)  BMI 23.64 kg/m2  SpO2 96%  Physical Exam  Nursing note and vitals reviewed. Constitutional: She appears well-developed and well-nourished. No distress.  HENT:  Head: Normocephalic and atraumatic.  Eyes: Conjunctivae and EOM are normal.  Cardiovascular: Normal rate and regular rhythm.   Pulmonary/Chest: Effort normal and breath sounds normal. No stridor. No respiratory distress.  Abdominal: She exhibits no distension.  Belly is soft, nondistended, with audible bowel  sounds.  Minimal tenderness to palpation throughout.  Musculoskeletal: She exhibits no edema.  Neurological: She is alert. No cranial nerve deficit.  Patient is awake and alert, moving all extremities, but offers no clues of orientation status.  Skin: Skin is warm and dry.  Psychiatric:  Patient is largely noncommunicative mother she occasionally tracks with her eyes.  She is withdrawn, offering overall responses, unable to fully assess psychiatric condition    ED Course  Procedures (including critical care time)  Labs Reviewed  CBC WITH DIFFERENTIAL - Abnormal; Notable for the following:    RBC 3.45 (*)    Hemoglobin 10.6 (*)    HCT 33.7 (*)    RDW 16.9 (*)    Platelets 128 (*)    Neutro Abs 7.9 (*)    Lymphocytes Relative 11 (*)    Monocytes Absolute 1.2 (*)    All other components within normal limits  COMPREHENSIVE METABOLIC PANEL - Abnormal; Notable for the following:    Potassium 3.1 (*)    Chloride 95 (*)    Glucose, Bld 177 (*)    Creatinine, Ser 3.38 (*)    Calcium 10.6 (*)    Total Protein 5.6 (*)    Albumin 2.5 (*)    GFR calc non Af Amer 12 (*)    GFR calc Af Amer 14 (*)    All other components within normal limits   Dg Abd Acute W/chest  05/30/2012  *RADIOLOGY REPORT*  Clinical Data: Abdominal pain, diarrhea  ACUTE ABDOMEN SERIES (ABDOMEN 2 VIEW & CHEST 1 VIEW)  Comparison: 03/24/2012  Findings: Dual lumen right hemodialysis catheter terminates over the high right atrium / cavoatrial junction.  Moderate enlargement cardiomediastinal silhouette is stable. Aorta is ectatic and unfolded.  Lung volumes are low with crowding of the bronchovascular markings.  No pleural effusion.  No free air beneath the diaphragms.  Abdominal radiographs are suboptimal due to patient inability to be properly positioned.  No bowel dilatation identified.  Moderate to severe aortic atherosclerotic calcification.  No abnormal calcific opacity.  No new osseous abnormality.  IMPRESSION:  Nonobstructive bowel gas pattern.  Cardiomegaly without focal acute cardiopulmonary process allowing for hypoaeration and technique.   Original Report Authenticated By: Christiana Pellant, M.D.      No diagnosis found.  Cardiac 110 A. fib abnormal Pulse ox 97% room air normal    Date: 05/30/2012  Rate: 73  Rhythm: normal sinus rhythm and atrial fibrillation  QRS Axis: left  Intervals: PR prolonged  ST/T Wave abnormalities: nonspecific T wave changes  Conduction Disutrbances:first-degree A-V block   Narrative Interpretation:   Old EKG Reviewed: changes noted  AFIB - not new, per chart review   Initial labs largely unremarkable.   MDM  This elderly female with end-stage renal disease, chronic pain now presents with familial concerns of constipation, the patient had one bowel movement within the past 48 hours.  On exam the patient is in no distress, has a mild tachycardia, with known A. fib, is hemodynamically stable.  Labs are largely unremarkable, x-ray of the abdomen does not show pneumoperitoneum, nor significant distention, and is nonobstructive.  Additionally, the passage of stool recently his reassuring for the low suspicion of obstruction.  The patient digital disimpaction and received an  enema in the ED.  Absent distress is appropriate for discharge with a bowel regimen.  Family was counseled on the need to minimize narcotics, take all medication as directed, follow up with her primary care physician.   Gerhard Munch, MD 05/30/12 2003

## 2012-05-30 NOTE — Discharge Instructions (Signed)
As discussed, it is important that you follow up as soon as possible with your physician for continued management of your condition.  Additionally, please try to limit narcotic use, keep the patient well hydrated.  Enemas, used as directed will be beneficial as well.   If you develop any new, or concerning changes in your condition, please return to the emergency department immediately.     Constipation, Adult Constipation is when a person:  Poops (bowel movement) less than 3 times a week.  Has a hard time pooping.  Has poop that is dry, hard, or bigger than normal. HOME CARE   Eat more fiber, such as fruits, vegetables, whole grains like brown rice, and beans.  Eat less fatty foods and sugar. This includes [place] fries, hamburgers, cookies, candy, and soda.  If you are not getting enough fiber from food, take products with added fiber in them (supplements).  Drink enough fluid to keep your pee (urine) clear or pale yellow.  Go to the restroom when you feel like you need to poop. Do not hold it.  Only take medicine as told by your doctor. Do not take medicines that help you poop (laxatives) without talking to your doctor first.  Exercise on a regular basis, or as told by your doctor. GET HELP RIGHT AWAY IF:   You have bright red blood in your poop (stool).  Your constipation lasts more than 4 days or gets worse.  You have belly (abdomen) or butt (rectal) pain.  You have thin poop (as thin as a pencil).  You lose weight, and it cannot be explained. MAKE SURE YOU:   Understand these instructions.  Will watch your condition.  Will get help right away if you are not doing well or get worse. Document Released: 07/10/2007 Document Revised: 04/15/2011 Document Reviewed: 12/25/2010 [facility] Patient Information 2013 [place], [place].

## 2012-05-30 NOTE — ED Notes (Signed)
Large amount of stool returned after fleets enema.

## 2012-06-03 ENCOUNTER — Emergency Department (HOSPITAL_COMMUNITY): Payer: Medicare Other

## 2012-06-03 ENCOUNTER — Encounter (HOSPITAL_COMMUNITY): Payer: Self-pay | Admitting: Adult Health

## 2012-06-03 ENCOUNTER — Observation Stay (HOSPITAL_COMMUNITY)
Admission: EM | Admit: 2012-06-03 | Discharge: 2012-06-05 | Disposition: A | Discharge status: Home / Self Care | Payer: Medicare Other | Attending: Internal Medicine | Admitting: Internal Medicine

## 2012-06-03 DIAGNOSIS — L89601 Pressure ulcer of unspecified heel, stage 1: Secondary | ICD-10-CM

## 2012-06-03 DIAGNOSIS — Z7401 Bed confinement status: Secondary | ICD-10-CM | POA: Insufficient documentation

## 2012-06-03 DIAGNOSIS — L8991 Pressure ulcer of unspecified site, stage 1: Secondary | ICD-10-CM

## 2012-06-03 DIAGNOSIS — G20 Parkinson's disease: Secondary | ICD-10-CM | POA: Diagnosis present

## 2012-06-03 DIAGNOSIS — R55 Syncope and collapse: Principal | ICD-10-CM | POA: Diagnosis present

## 2012-06-03 DIAGNOSIS — I4891 Unspecified atrial fibrillation: Secondary | ICD-10-CM | POA: Diagnosis present

## 2012-06-03 DIAGNOSIS — G20A1 Parkinson's disease without dyskinesia, without mention of fluctuations: Secondary | ICD-10-CM | POA: Diagnosis present

## 2012-06-03 DIAGNOSIS — N2581 Secondary hyperparathyroidism of renal origin: Secondary | ICD-10-CM | POA: Insufficient documentation

## 2012-06-03 DIAGNOSIS — E876 Hypokalemia: Secondary | ICD-10-CM | POA: Insufficient documentation

## 2012-06-03 DIAGNOSIS — E039 Hypothyroidism, unspecified: Secondary | ICD-10-CM | POA: Insufficient documentation

## 2012-06-03 DIAGNOSIS — Z794 Long term (current) use of insulin: Secondary | ICD-10-CM | POA: Insufficient documentation

## 2012-06-03 DIAGNOSIS — R4189 Other symptoms and signs involving cognitive functions and awareness: Secondary | ICD-10-CM

## 2012-06-03 DIAGNOSIS — I120 Hypertensive chronic kidney disease with stage 5 chronic kidney disease or end stage renal disease: Secondary | ICD-10-CM | POA: Insufficient documentation

## 2012-06-03 DIAGNOSIS — E119 Type 2 diabetes mellitus without complications: Secondary | ICD-10-CM | POA: Insufficient documentation

## 2012-06-03 DIAGNOSIS — Z992 Dependence on renal dialysis: Secondary | ICD-10-CM | POA: Insufficient documentation

## 2012-06-03 DIAGNOSIS — L89623 Pressure ulcer of left heel, stage 3: Secondary | ICD-10-CM

## 2012-06-03 DIAGNOSIS — L8993 Pressure ulcer of unspecified site, stage 3: Secondary | ICD-10-CM | POA: Insufficient documentation

## 2012-06-03 DIAGNOSIS — Z4789 Encounter for other orthopedic aftercare: Secondary | ICD-10-CM | POA: Insufficient documentation

## 2012-06-03 DIAGNOSIS — D539 Nutritional anemia, unspecified: Secondary | ICD-10-CM | POA: Insufficient documentation

## 2012-06-03 DIAGNOSIS — F0280 Dementia in other diseases classified elsewhere without behavioral disturbance: Secondary | ICD-10-CM | POA: Insufficient documentation

## 2012-06-03 DIAGNOSIS — I69920 Aphasia following unspecified cerebrovascular disease: Secondary | ICD-10-CM | POA: Insufficient documentation

## 2012-06-03 DIAGNOSIS — G3183 Dementia with Lewy bodies: Secondary | ICD-10-CM | POA: Insufficient documentation

## 2012-06-03 DIAGNOSIS — M949 Disorder of cartilage, unspecified: Secondary | ICD-10-CM | POA: Insufficient documentation

## 2012-06-03 DIAGNOSIS — L89609 Pressure ulcer of unspecified heel, unspecified stage: Secondary | ICD-10-CM | POA: Diagnosis present

## 2012-06-03 DIAGNOSIS — N186 End stage renal disease: Secondary | ICD-10-CM | POA: Insufficient documentation

## 2012-06-03 DIAGNOSIS — M899 Disorder of bone, unspecified: Secondary | ICD-10-CM | POA: Insufficient documentation

## 2012-06-03 LAB — CBC
HCT: 33.9 % — ABNORMAL LOW (ref 36.0–46.0)
Hemoglobin: 11.2 g/dL — ABNORMAL LOW (ref 12.0–15.0)
MCV: 93.1 fL (ref 78.0–100.0)
RBC: 3.64 MIL/uL — ABNORMAL LOW (ref 3.87–5.11)
RDW: 16.3 % — ABNORMAL HIGH (ref 11.5–15.5)
WBC: 11.9 10*3/uL — ABNORMAL HIGH (ref 4.0–10.5)

## 2012-06-03 LAB — COMPREHENSIVE METABOLIC PANEL
Albumin: 2.6 g/dL — ABNORMAL LOW (ref 3.5–5.2)
Alkaline Phosphatase: 112 U/L (ref 39–117)
BUN: 7 mg/dL (ref 6–23)
Calcium: 9 mg/dL (ref 8.4–10.5)
Potassium: 2.7 mEq/L — CL (ref 3.5–5.1)
Total Protein: 5.8 g/dL — ABNORMAL LOW (ref 6.0–8.3)

## 2012-06-03 LAB — GLUCOSE, CAPILLARY: Glucose-Capillary: 163 mg/dL — ABNORMAL HIGH (ref 70–99)

## 2012-06-03 MED ORDER — ENOXAPARIN SODIUM 30 MG/0.3ML ~~LOC~~ SOLN
30.0000 mg | SUBCUTANEOUS | Status: DC
  Filled 2012-06-03 (×2): qty 0.3

## 2012-06-03 MED ORDER — CALCIUM ACETATE (PHOS BINDER) 667 MG/5ML PO SOLN
667.0000 mg | ORAL | Status: DC | PRN
  Filled 2012-06-03 (×2): qty 5

## 2012-06-03 MED ORDER — SODIUM CHLORIDE 0.9 % IV SOLN
INTRAVENOUS | Status: AC
  Administered 2012-06-03: via INTRAVENOUS

## 2012-06-03 MED ORDER — CARBIDOPA-LEVODOPA 25-100 MG PO TABS
1.5000 | ORAL_TABLET | Freq: Three times a day (TID) | ORAL | Status: DC
  Administered 2012-06-04 – 2012-06-05 (×4): 1.5 via ORAL
  Filled 2012-06-03 (×7): qty 1.5

## 2012-06-03 MED ORDER — INSULIN ASPART 100 UNIT/ML ~~LOC~~ SOLN
0.0000 [IU] | Freq: Three times a day (TID) | SUBCUTANEOUS | Status: DC
  Administered 2012-06-05: 2 [IU] via SUBCUTANEOUS

## 2012-06-03 MED ORDER — LEVOTHYROXINE SODIUM 25 MCG PO TABS
25.0000 ug | ORAL_TABLET | Freq: Every day | ORAL | Status: DC
  Administered 2012-06-05: 25 ug via ORAL
  Filled 2012-06-03 (×3): qty 1

## 2012-06-03 MED ORDER — POTASSIUM CHLORIDE CRYS ER 20 MEQ PO TBCR
40.0000 meq | EXTENDED_RELEASE_TABLET | Freq: Once | ORAL | Status: AC
Start: 2012-06-03 — End: 2012-06-03
  Administered 2012-06-03: 40 meq via ORAL
  Filled 2012-06-03: qty 2

## 2012-06-03 MED ORDER — CARVEDILOL 12.5 MG PO TABS
12.5000 mg | ORAL_TABLET | Freq: Two times a day (BID) | ORAL | Status: DC
  Administered 2012-06-04 – 2012-06-05 (×3): 12.5 mg via ORAL
  Filled 2012-06-03 (×5): qty 1

## 2012-06-03 MED ORDER — CALCIUM ACETATE (PHOS BINDER) 667 MG/5ML PO SOLN: 667.0000 mg | Freq: Three times a day (TID) | ORAL | Status: DC

## 2012-06-03 MED ORDER — CALCIUM ACETATE (PHOS BINDER) 667 MG/5ML PO SOLN
1334.0000 mg | Freq: Three times a day (TID) | ORAL | Status: DC
  Administered 2012-06-04 – 2012-06-05 (×3): 1334 mg via ORAL
  Filled 2012-06-03 (×8): qty 10

## 2012-06-03 MED ORDER — INSULIN GLARGINE 100 UNIT/ML ~~LOC~~ SOLN
5.0000 [IU] | Freq: Every day | SUBCUTANEOUS | Status: DC
  Administered 2012-06-04 (×2): 5 [IU] via SUBCUTANEOUS
  Filled 2012-06-03 (×3): qty 0.05

## 2012-06-03 MED ORDER — FLUOXETINE HCL 20 MG PO TABS
40.0000 mg | ORAL_TABLET | Freq: Every day | ORAL | Status: DC
  Administered 2012-06-04 – 2012-06-05 (×2): 40 mg via ORAL
  Filled 2012-06-03 (×2): qty 2

## 2012-06-03 MED ORDER — DONEPEZIL HCL 10 MG PO TABS
10.0000 mg | ORAL_TABLET | Freq: Every day | ORAL | Status: DC
  Filled 2012-06-03: qty 1

## 2012-06-03 MED ORDER — TRAMADOL HCL 50 MG PO TABS: 50.0000 mg | ORAL_TABLET | Freq: Four times a day (QID) | ORAL | Status: DC | PRN

## 2012-06-03 MED ORDER — CINACALCET HCL 30 MG PO TABS
30.0000 mg | ORAL_TABLET | Freq: Every day | ORAL | Status: DC
  Administered 2012-06-04 – 2012-06-05 (×2): 30 mg via ORAL
  Filled 2012-06-03 (×3): qty 1

## 2012-06-03 MED ORDER — ATORVASTATIN CALCIUM 10 MG PO TABS
10.0000 mg | ORAL_TABLET | Freq: Every day | ORAL | Status: DC
  Administered 2012-06-04 – 2012-06-05 (×2): 10 mg via ORAL
  Filled 2012-06-03 (×2): qty 1

## 2012-06-03 MED ORDER — LORATADINE 10 MG PO TABS
10.0000 mg | ORAL_TABLET | Freq: Every day | ORAL | Status: DC
  Administered 2012-06-04 – 2012-06-05 (×2): 10 mg via ORAL
  Filled 2012-06-03 (×2): qty 1

## 2012-06-03 MED ORDER — RENA-VITE PO TABS
1.0000 | ORAL_TABLET | Freq: Every day | ORAL | Status: DC
  Administered 2012-06-04 – 2012-06-05 (×2): 1 via ORAL
  Filled 2012-06-03 (×2): qty 1

## 2012-06-03 NOTE — H&P (Signed)
[facility] History and Physical  Katrina Meyer ZOX:[id number] DOB: November 03, 1932 DOA: 06/03/2012  Referring physician: ER physician. PCP: Ignatius Specking., MD  Specialists: Nephrologist for dialysis.  Chief Complaint: Loss of consciousness.  HPI: Katrina Meyer is a 79 y.o. female with known history of ESRD on hemodialysis was brought to the ER after patient was found to have a brief spell of loss of consciousness. As per patient's daughter and patient's caretaker who were at the bedside patient had a brief spell of loss of consciousness while she was at her home just arriving after dialysis. She may have lost consciousness for a few seconds. Once she was consciousness CPR was started by standers and patient regained consciousness immediately. Her CBG checks over there was 130. Patient was brought to the ER. Patient was found to be nonfocal. EKG was showing A. fib. CT head was showing nothing acute. Patient has been admitted for further observation. Patient has been bedbound for last few months after she had sustained a right tibia-fibula fracture for which patient has been wearing a cast. Patient also has bilateral heel ulcers. Patient otherwise denies any chest pain or shortness of breath.  Review of Systems: As presented in the history of presenting illness, rest negative.  Past Medical History  Diagnosis Date  . Hyperlipidemia     takes Lipitor daily  . DVT (deep venous thrombosis)   . Stroke   . Parkinson's disease     takes Sinemet daily  . Tibia/fibula fracture 01/04/2012    Treated with long leg casting. Cast change in hospital 02/27/12   . Skin ulcer of great toe 02/27/2012    Right great toe   . Hematochezia 03/09/2012  . Atrial fibrillation 03/09/2012    Seen on older EKG as well but family not aware.   . Macrocytic anemia 03/09/2012  . Diverticulosis 03/10/2012  . End stage renal disease on dialysis     [facility]- MWF  . Hypertension     takes Coreg daily  . Dementia      takes Aricept daily  . Diabetes mellitus     takes Lantus and Novolog  . Seasonal allergies     takes Allegra daily  . Depression     takes Prozac daily  . Hypothyroidism     takes Synthroid daily  . Diarrhea     takes Immodium prn   Past Surgical History  Procedure Laterality Date  . Dg av dialysis graft declot or    . Av fistula repair  Aug. 2013    Left arm  . Cholecystectomy    . Colon surgery      Aprrox 2006 at [facility] after she had a colonoscopy  . Colonoscopy  03/10/2012    Procedure: COLONOSCOPY;  Surgeon: Malissa Hippo, MD;  [facility];  Service: Endoscopy;  Laterality: N/A;  . Esophagogastroduodenoscopy  03/10/2012    Procedure: ESOPHAGOGASTRODUODENOSCOPY (EGD);  Surgeon: Malissa Hippo, MD;  [facility]: [facility];  Service: Endoscopy;  Laterality: N/A;  EGD if TCS normal.  . Ligation of arteriovenous  fistula Left 03/24/2012    Procedure: LIGATION OF ARTERIOVENOUS  FISTULA;  Surgeon: Fransisco Hertz, MD;  Location: [facility] OR;  Service: Vascular;  Laterality: Left;  . Av fistula placement Right 04/28/2012    Procedure: INSERTION OF ARTERIOVENOUS (AV) GORE-TEX GRAFT ARM;  Surgeon: Sherren Kerns, MD;  Location: [facility] OR;  Service: Vascular;  Laterality: Right;   Social History:  reports that she has  been passively smoking.  She has never used smokeless tobacco. She reports that she does not drink alcohol or use illicit drugs. Lives at home. where does patient live-- Can do ADLs. Can patient participate in ADLs?  Allergies  Allergen Reactions  . Hydralazine Rash    Family History  Problem Relation Age of Onset  . Stroke Mother   . Diabetes Mother   . Cancer Mother   . Hypertension Mother   . Coronary artery disease Father   . Diabetes Sister       Prior to Admission medications   Medication Sig Start Date End Date Taking? Authorizing Provider  atorvastatin (LIPITOR) 10 MG tablet Take 10 mg by mouth daily.    Yes Historical Provider, MD  calcium  acetate, Phos Binder, (PHOSLYRA) 667 MG/5ML SOLN Take 667-1,334 mg by mouth 3 (three) times daily with meals. Takes with snacks and with meals   Yes Historical Provider, MD  carbidopa-levodopa (SINEMET IR) 25-100 MG per tablet Take 1.5 tablets by mouth 3 (three) times daily.   Yes Historical Provider, MD  carvedilol (COREG) 12.5 MG tablet Take 12.5 mg by mouth 2 (two) times daily with a meal.   Yes Historical Provider, MD  cinacalcet (SENSIPAR) 30 MG tablet Take 30 mg by mouth daily.   Yes Historical Provider, MD  donepezil (ARICEPT) 10 MG tablet Take 10 mg by mouth daily.   Yes Historical Provider, MD  fexofenadine (ALLEGRA) 180 MG tablet Take 180 mg by mouth daily.   Yes Historical Provider, MD  FLUoxetine (PROZAC) 40 MG capsule Take 40 mg by mouth daily.   Yes Historical Provider, MD  insulin glargine (LANTUS) 100 UNIT/ML injection Inject 5 Units into the skin at bedtime.    Yes Historical Provider, MD  insulin NPH-regular (NOVOLIN 70/30) (70-30) 100 UNIT/ML injection Inject 1-5 Units into the skin 3 (three) times daily. Per sliding scale   Yes Historical Provider, MD  levothyroxine (SYNTHROID, LEVOTHROID) 25 MCG tablet Take 25 mcg by mouth every morning.   Yes Historical Provider, MD  multivitamin (RENA-VIT) TABS tablet Take 1 tablet by mouth daily.   Yes Historical Provider, MD  traMADol (ULTRAM) 50 MG tablet Take 50 mg by mouth every 6 (six) hours as needed for pain.   Yes Historical Provider, MD   Physical Exam: Filed Vitals:   06/03/12 1827 06/03/12 1952 06/03/12 2108 06/03/12 2308  BP: 149/125  152/128 120/56  Pulse: 76  93 114  Temp: 98.6 F (37 C) 98.7 F (37.1 C)  98 F (36.7 C)  TempSrc: Oral   Oral  Resp: 19  16 16   Height:    5\' 9"  (1.753 m)  SpO2: 97%  97% 100%     General:  Well-developed and nourished.  Eyes: Anicteric no pallor.  ENT: No discharge from ears eyes nose and mouth.  Neck: No mass felt.  Cardiovascular: S1-S2 heard.  Respiratory: No  rhonchi or crepitations.  Abdomen: Soft nontender bowel sounds present.  Skin: Patient has 3 decubitus ulcers on the feet. 2 on the right and one on the left.  Musculoskeletal: No edema. Right leg is in the cast.  Psychiatric: Patient is oriented to her name.  Neurologic: Follows commands.  Labs on Admission:  Basic Metabolic Panel:  Recent Labs Lab 05/30/12 1735 06/03/12 1838  NA 137 139  K 3.1* 2.7*  CL 95* 96  CO2 31 29  GLUCOSE 177* 181*  BUN 17 7  CREATININE 3.38* 1.76*  CALCIUM 10.6* 9.0  Liver Function Tests:  Recent Labs Lab 05/30/12 1735 06/03/12 1838  AST 18 19  ALT <5 <5  ALKPHOS 116 112  BILITOT 0.4 0.5  PROT 5.6* 5.8*  ALBUMIN 2.5* 2.6*   No results found for this basename: LIPASE, AMYLASE,  in the last 168 hours No results found for this basename: AMMONIA,  in the last 168 hours CBC:  Recent Labs Lab 05/30/12 1735 06/03/12 1838  WBC 10.5 11.9*  NEUTROABS 7.9*  --   HGB 10.6* 11.2*  HCT 33.7* 33.9*  MCV 97.7 93.1  PLT 128* 132*   Cardiac Enzymes: No results found for this basename: CKTOTAL, CKMB, CKMBINDEX, TROPONINI,  in the last 168 hours  BNP (last 3 results) No results found for this basename: PROBNP,  in the last 8760 hours CBG:  Recent Labs Lab 06/03/12 2019  GLUCAP 163*    Radiological Exams on Admission: Ct Head Wo Contrast  06/03/2012  *RADIOLOGY REPORT*  Clinical Data: Unresponsive patient following dialysis.  CT HEAD WITHOUT CONTRAST  Technique:  Contiguous axial images were obtained from the base of the skull through the vertex without contrast.  Comparison: 03/09/2012.  Findings: No mass lesion, mass effect, midline shift, hydrocephalus, hemorrhage.  No acute territorial cortical ischemia/infarct. Atrophy and chronic ischemic white matter disease is present.  Intracranial atherosclerosis.  Mastoid air cells clear.  Scattered old lacunar infarcts are similar to prior.  IMPRESSION: Atrophy, chronic ischemic white matter  disease and old lacunar infarcts without acute intracranial abnormality.   Original Report Authenticated By: Andreas Newport, M.D.    Dg Chest Port 1 View  06/03/2012  *RADIOLOGY REPORT*  Clinical Data: Syncope after dialysis  PORTABLE CHEST - 1 VIEW  Comparison: 05/30/2012  Findings: 1900 hours.  Low volume film. The cardiopericardial silhouette is enlarged. The lungs are clear without focal infiltrate, edema, pneumothorax or pleural effusion.  Right IJ dialysis catheter tip projects at the distal SVC level. Telemetry leads overlie the chest.  IMPRESSION: Low volume film without acute cardiopulmonary findings.   Original Report Authenticated By: Kennith Center, M.D.     EKG: Independently reviewed. Atrial fibrillation.  Assessment/Plan Principal Problem:   Syncope Active Problems:   DM   ESRD   Atrial fibrillation   Parkinson's disease   Decubitus ulcer, heel   1. Syncope - cause is not clear. At this time we will admit patient to telemetry and closely monitor for any arrhythmias. Check d-dimer as patient is usually bed bound from her lower extremity fracture. Cycle cardiac markers. Check MRI brain.  2. Atrial fibrillation - rate controlled at this time. Patient not a candidate for Coumadin secondary to GI bleed. 3. ESRD on hemodialysis - consult nephrology for dialysis. Patient was mildly hypokalemic and 40 mEq of potassium was given orally. 4. Diabetes mellitus type 2 - closely follow CBG. 5. Parkinson's disease - continue home medications. 6. Bilateral heel decubitus ulcers - wound team consulted. 7. Right lower extremity fracture - Dr. Otelia Sergeant, orthopedic surgeon is following. As per patient's daughter they are supposed to follow with Dr. Otelia Sergeant tomorrow to remove the cast. The contact with Dr. Barbaraann Faster office if they want to do it while in the hospital.    Code Status: Full code.  Family Communication: Patient's daughter at the bedside.  Disposition Plan: Admit for observation.     Deiondra Denley N. [facility] Pager [phone].  If 7PM-7AM, please contact night-coverage [email or web address] Password [facility] 06/03/2012, 11:35 PM

## 2012-06-03 NOTE — ED Notes (Signed)
Per EMS, pt found unresponsive after dialysis, CNA tried to arouse pt and called 911.  Pt will follow commands, no speech.

## 2012-06-03 NOTE — ED Provider Notes (Signed)
History     CSN: [id number]  Arrival date & time 06/03/12  1801   First MD Initiated Contact with Patient 06/03/12 1804      No chief complaint on file.   (Consider location/radiation/quality/duration/timing/severity/associated sxs/prior treatment) HPI Comments: 79 y F with PMH of CVA (residual aphasia), dementia, Parkinson's, IDDM, ESRD on MWF HD and HTN here after an unresponsive episode at home.  She was sitting in the front seat of the car after returning from dailysis and reportedly became unresponsive.  The caretaker at the home began performing compressions and the pt woke up soon thereafter.  They are unsure how long her symptoms lasted, but do not feel it lasted very long.  They feel she is back to her baseline currently.  Patient is a 79 y.o. female presenting with syncope. The history is provided by the EMS personnel.  Loss of Consciousness  This is a new problem. The current episode started less than 1 hour ago. The problem occurs constantly. The problem has been resolved. She lost consciousness for a period of less than one minute. Treatments tried: compressions.    Past Medical History  Diagnosis Date  . Hyperlipidemia     takes Lipitor daily  . DVT (deep venous thrombosis)   . Stroke   . Parkinson's disease     takes Sinemet daily  . Tibia/fibula fracture 01/04/2012    Treated with long leg casting. Cast change in hospital 02/27/12   . Skin ulcer of great toe 02/27/2012    Right great toe   . Hematochezia 03/09/2012  . Atrial fibrillation 03/09/2012    Seen on older EKG as well but family not aware.   . Macrocytic anemia 03/09/2012  . Diverticulosis 03/10/2012  . End stage renal disease on dialysis     [facility]- MWF  . Hypertension     takes Coreg daily  . Dementia     takes Aricept daily  . Diabetes mellitus     takes Lantus and Novolog  . Seasonal allergies     takes Allegra daily  . Depression     takes Prozac daily  . Hypothyroidism     takes  Synthroid daily  . Diarrhea     takes Immodium prn    Past Surgical History  Procedure Laterality Date  . Dg av dialysis graft declot or    . Av fistula repair  Aug. 2013    Left arm  . Cholecystectomy    . Colon surgery      Aprrox 2006 at [facility] after she had a colonoscopy  . Colonoscopy  03/10/2012    Procedure: COLONOSCOPY;  Surgeon: Malissa Hippo, MD;  [facility];  Service: Endoscopy;  Laterality: N/A;  . Esophagogastroduodenoscopy  03/10/2012    Procedure: ESOPHAGOGASTRODUODENOSCOPY (EGD);  Surgeon: Malissa Hippo, MD;  Location: [facility];  Service: Endoscopy;  Laterality: N/A;  EGD if TCS normal.  . Ligation of arteriovenous  fistula Left 03/24/2012    Procedure: LIGATION OF ARTERIOVENOUS  FISTULA;  Surgeon: Fransisco Hertz, MD;  Location: [facility] OR;  Service: Vascular;  Laterality: Left;  . Av fistula placement Right 04/28/2012    Procedure: INSERTION OF ARTERIOVENOUS (AV) GORE-TEX GRAFT ARM;  Surgeon: Sherren Kerns, MD;  Location: [facility] OR;  Service: Vascular;  Laterality: Right;    Family History  Problem Relation Age of Onset  . Stroke Mother   . Diabetes Mother   . Cancer Mother   . Hypertension Mother   .  Coronary artery disease Father   . Diabetes Sister     History  Substance Use Topics  . Smoking status: Passive Smoke Exposure - Never Smoker  . Smokeless tobacco: Never Used  . Alcohol Use: No    OB History   Grav Para Term Preterm Abortions TAB SAB Ect Mult Living                  Review of Systems  Unable to perform ROS: Patient nonverbal  Cardiovascular: Positive for syncope.    Allergies  Hydralazine  Home Medications   Current Outpatient Rx  Name  Route  Sig  Dispense  Refill  . amLODipine (NORVASC) 5 MG tablet   Oral   Take 5 mg by mouth daily.         Marland Kitchen atorvastatin (LIPITOR) 10 MG tablet   Oral   Take 10 mg by mouth daily.          . carbidopa-levodopa (SINEMET IR) 25-100 MG per tablet   Oral   Take 1.5 tablets  by mouth 3 (three) times daily.         . carvedilol (COREG) 6.25 MG tablet   Oral   Take 6.25 mg by mouth 2 (two) times daily with a meal.         . donepezil (ARICEPT) 10 MG tablet   Oral   Take 10 mg by mouth daily.         Marland Kitchen FLUoxetine (PROZAC) 40 MG capsule   Oral   Take 40 mg by mouth daily.         Marland Kitchen glipiZIDE (GLUCOTROL XL) 10 MG 24 hr tablet   Oral   Take 10 mg by mouth daily.         . insulin aspart (NOVOLOG) 100 UNIT/ML injection   Subcutaneous   Inject 0-3 Units into the skin 3 (three) times daily before meals.         . insulin glargine (LANTUS) 100 UNIT/ML injection   Subcutaneous   Inject 5 Units into the skin at bedtime.          Marland Kitchen levothyroxine (SYNTHROID, LEVOTHROID) 25 MCG tablet   Oral   Take 25 mcg by mouth every morning.         . multivitamin (RENA-VIT) TABS tablet   Oral   Take 1 tablet by mouth daily.         Marland Kitchen OVER THE COUNTER MEDICATION   Oral   Take 10 mLs by mouth daily as needed (Diabetic cough medication.).         Marland Kitchen OVER THE COUNTER MEDICATION   Topical   Apply 1 application topically daily as needed (Diaper rash cream.).         Marland Kitchen ramipril (ALTACE) 5 MG capsule   Oral   Take 5-10 mg by mouth daily. Patient takes 2 capsules in the morning and 1 capsule in the evening         . senna-docusate (SENOKOT-S) 8.6-50 MG per tablet   Oral   Take 2 tablets by mouth daily.   30 tablet   0    Vital Signs - Temp: 98.6 F (37 C) ; Temp src: Oral ; Pulse Rate: 76 ; Resp: 19 ; BP: 149/125 mmHg  Oxygen Therapy - SpO2: 97 % ; O2 Device: None (Room air)  Physical Exam  Vitals reviewed. Constitutional: She appears well-developed and well-nourished.  HENT:  Right Ear: External ear normal.  Left Ear: External ear  normal.  Mouth/Throat: No oropharyngeal exudate.  Eyes: Conjunctivae and EOM are normal. Pupils are equal, round, and reactive to light.  Neck: Normal range of motion. Neck supple.  Cardiovascular: Normal  rate, normal heart sounds and intact distal pulses.  An irregular rhythm present. Exam reveals no gallop and no friction rub.   No murmur heard. Premature beats  Pulmonary/Chest: Effort normal and breath sounds normal.  Abdominal: Soft. Bowel sounds are normal. She exhibits no distension. There is tenderness (mild, diffusely).  Musculoskeletal: Normal range of motion. She exhibits no edema.  Right lower leg in a cast  Neurological: She is alert. No cranial nerve deficit.  Aphasic Moves on all 4 extremities equally on command - Upper > lower EOMI  Skin: Skin is warm and dry. No rash noted.  Psychiatric: She has a normal mood and affect.    ED Course  Procedures (including critical care time)  Labs Reviewed  COMPREHENSIVE METABOLIC PANEL - Abnormal; Notable for the following:    Potassium 2.7 (*)    Glucose, Bld 181 (*)    Creatinine, Ser 1.76 (*)    Total Protein 5.8 (*)    Albumin 2.6 (*)    GFR calc non Af Amer 27 (*)    GFR calc Af Amer 31 (*)    All other components within normal limits  CBC - Abnormal; Notable for the following:    WBC 11.9 (*)    RBC 3.64 (*)    Hemoglobin 11.2 (*)    HCT 33.9 (*)    RDW 16.3 (*)    Platelets 132 (*)    All other components within normal limits  GLUCOSE, CAPILLARY - Abnormal; Notable for the following:    Glucose-Capillary 163 (*)    All other components within normal limits  URINALYSIS, ROUTINE W REFLEX MICROSCOPIC  POCT I-STAT TROPONIN I   Ct Head Wo Contrast  06/03/2012  *RADIOLOGY REPORT*  Clinical Data: Unresponsive patient following dialysis.  CT HEAD WITHOUT CONTRAST  Technique:  Contiguous axial images were obtained from the base of the skull through the vertex without contrast.  Comparison: 03/09/2012.  Findings: No mass lesion, mass effect, midline shift, hydrocephalus, hemorrhage.  No acute territorial cortical ischemia/infarct. Atrophy and chronic ischemic white matter disease is present.  Intracranial atherosclerosis.   Mastoid air cells clear.  Scattered old lacunar infarcts are similar to prior.  IMPRESSION: Atrophy, chronic ischemic white matter disease and old lacunar infarcts without acute intracranial abnormality.   Original Report Authenticated By: Andreas Newport, M.D.    Dg Chest Port 1 View  06/03/2012  *RADIOLOGY REPORT*  Clinical Data: Syncope after dialysis  PORTABLE CHEST - 1 VIEW  Comparison: 05/30/2012  Findings: 1900 hours.  Low volume film. The cardiopericardial silhouette is enlarged. The lungs are clear without focal infiltrate, edema, pneumothorax or pleural effusion.  Right IJ dialysis catheter tip projects at the distal SVC level. Telemetry leads overlie the chest.  IMPRESSION: Low volume film without acute cardiopulmonary findings.   Original Report Authenticated By: Kennith Center, M.D.     Date: 06/03/2012  Rate: 98  Rhythm: normal sinus rhythm with premature supraventricular complexes  QRS Axis: left  Intervals: QT prolonged  ST/T Wave abnormalities: nonspecific T wave changes  Conduction Disutrbances:none  Narrative Interpretation: NSR with multiple PVCs and PSVCs, PVCs new from prior on 05/30/2012  Old EKG Reviewed: changes noted    1. Unresponsive episode   2. Hypokalemia   3. ESRD   4. Atrial fibrillation  MDM   75 y F with PMH of CVA (residual aphasia), dementia, Parkinson's, IDDM, ESRD on MWF HD and HTN here after an unresponsive episode at home.  She was sitting in the front seat of the car after returning from dailysis and reportedly became unresponsive.  The caretaker at the home began performing compressions and the pt woke up soon thereafter.  They are unsure how long her symptoms lasted, but do not feel it lasted very long.  They feel she is back to her baseline currently.   Aphasic on exam.  She moves all extremities on command, upper>lower.  RLE in cast.  Lungs with crackles in bases.  Cardiac exam unremarkable. Abd soft, mild TTP.   Diff Dx: Syncope (cardiogenic,  vasovagal, orthostatic), CVA, PNA/UTI.  CBC, CMP, Trop, Urine, CXR, EKG. CT head  9:00 PM Workup unrevealing.  She has eaten [employer] while in the ED.  Family feels she is back to baseline but are agreeable with admission for obs.  Will admit to Internal Medicine for further monitoring and evaluation.  Disposition: Admit  Condition: Stable  Pt seen in conjunction with my attending, Dr. Manus Gunning.  Oleh Genin, MD PGY-II [facility] Emergency Medicine Resident           Oleh Genin, MD 06/04/12 [phone]

## 2012-06-04 ENCOUNTER — Observation Stay (HOSPITAL_COMMUNITY): Payer: Medicare Other

## 2012-06-04 DIAGNOSIS — R404 Transient alteration of awareness: Secondary | ICD-10-CM

## 2012-06-04 DIAGNOSIS — E876 Hypokalemia: Secondary | ICD-10-CM

## 2012-06-04 DIAGNOSIS — N186 End stage renal disease: Secondary | ICD-10-CM

## 2012-06-04 LAB — TROPONIN I
Troponin I: <0.3 ng/mL (ref <0.30)
Troponin I: <0.3 ng/mL (ref <0.30)
Troponin I: <0.3 ng/mL (ref <0.30)
Troponin I: <0.3 ng/mL (ref <0.30)

## 2012-06-04 LAB — CBC
HCT: 30.9 % — ABNORMAL LOW (ref 36.0–46.0)
Hemoglobin: 10 g/dL — ABNORMAL LOW (ref 12.0–15.0)
MCH: 30.2 pg (ref 26.0–34.0)
MCHC: 32.4 g/dL (ref 30.0–36.0)
MCV: 93.4 fL (ref 78.0–100.0)
RBC: 3.31 MIL/uL — ABNORMAL LOW (ref 3.87–5.11)

## 2012-06-04 LAB — CBC WITH DIFFERENTIAL/PLATELET
Basophils Absolute: 0.1 10*3/uL (ref 0.0–0.1)
Eosinophils Absolute: 0.1 10*3/uL (ref 0.0–0.7)
HCT: 28.9 % — ABNORMAL LOW (ref 36.0–46.0)
Lymphocytes Relative: 19 % (ref 12–46)
Lymphs Abs: 1.7 10*3/uL (ref 0.7–4.0)
MCHC: 33.2 g/dL (ref 30.0–36.0)
MCV: 92.9 fL (ref 78.0–100.0)
Monocytes Relative: 10 % (ref 3–12)
Neutro Abs: 6 10*3/uL (ref 1.7–7.7)
Platelets: UNDETERMINED 10*3/uL (ref 150–400)
RDW: 16.9 % — ABNORMAL HIGH (ref 11.5–15.5)
WBC: 8.8 10*3/uL (ref 4.0–10.5)

## 2012-06-04 LAB — COMPREHENSIVE METABOLIC PANEL
AST: 17 U/L (ref 0–37)
CO2: 29 mEq/L (ref 19–32)
Calcium: 9.4 mg/dL (ref 8.4–10.5)
Chloride: 100 mEq/L (ref 96–112)
Creatinine, Ser: 2.59 mg/dL — ABNORMAL HIGH (ref 0.50–1.10)
GFR calc Af Amer: 19 mL/min — ABNORMAL LOW (ref >90)
GFR calc non Af Amer: 17 mL/min — ABNORMAL LOW (ref >90)
Glucose, Bld: 159 mg/dL — ABNORMAL HIGH (ref 70–99)
Total Bilirubin: 0.3 mg/dL (ref 0.3–1.2)

## 2012-06-04 LAB — LIPID PANEL
Cholesterol: 96 mg/dL (ref 0–200)
Triglycerides: 118 mg/dL (ref <150)
VLDL: 24 mg/dL (ref 0–40)

## 2012-06-04 LAB — HEMOGLOBIN A1C: Hgb A1c MFr Bld: 6.9 % — ABNORMAL HIGH (ref <5.7)

## 2012-06-04 LAB — MAGNESIUM: Magnesium: 2 mg/dL (ref 1.5–2.5)

## 2012-06-04 LAB — GLUCOSE, CAPILLARY
Glucose-Capillary: 148 mg/dL — ABNORMAL HIGH (ref 70–99)
Glucose-Capillary: 177 mg/dL — ABNORMAL HIGH (ref 70–99)

## 2012-06-04 MED ORDER — DOXERCALCIFEROL 4 MCG/2ML IV SOLN
1.0000 ug | INTRAVENOUS | Status: DC
  Administered 2012-06-05: 1 ug via INTRAVENOUS
  Filled 2012-06-04: qty 2

## 2012-06-04 MED ORDER — DARBEPOETIN ALFA-POLYSORBATE 60 MCG/0.3ML IJ SOLN
60.0000 ug | INTRAMUSCULAR | Status: DC
  Administered 2012-06-05: 60 ug via INTRAVENOUS
  Filled 2012-06-04: qty 0.3

## 2012-06-04 MED ORDER — POTASSIUM CHLORIDE CRYS ER 20 MEQ PO TBCR
20.0000 meq | EXTENDED_RELEASE_TABLET | Freq: Once | ORAL | Status: AC
  Administered 2012-06-04: 20 meq via ORAL
  Filled 2012-06-04: qty 1

## 2012-06-04 MED ORDER — ENOXAPARIN SODIUM 30 MG/0.3ML ~~LOC~~ SOLN
30.0000 mg | SUBCUTANEOUS | Status: DC
  Administered 2012-06-04 – 2012-06-05 (×2): 30 mg via SUBCUTANEOUS
  Filled 2012-06-04 (×2): qty 0.3

## 2012-06-04 MED ORDER — IOHEXOL 350 MG/ML SOLN
100.0000 mL | Freq: Once | INTRAVENOUS | Status: AC | PRN
  Administered 2012-06-04: 100 mL via INTRAVENOUS

## 2012-06-04 MED ORDER — POTASSIUM CHLORIDE CRYS ER 20 MEQ PO TBCR: 20.0000 meq | EXTENDED_RELEASE_TABLET | Freq: Once | ORAL | Status: DC

## 2012-06-04 MED ORDER — CLONAZEPAM 0.5 MG PO TABS
0.5000 mg | ORAL_TABLET | Freq: Every day | ORAL | Status: DC
  Administered 2012-06-04: 0.5 mg via ORAL
  Filled 2012-06-04: qty 1

## 2012-06-04 NOTE — Evaluation (Signed)
Clinical/Bedside Swallow Evaluation Patient Details  Name: Katrina Meyer MRN: [id number] Date of Birth: 1932-08-12  Today's Date: 06/04/2012 Time: 5621-3086 SLP Time Calculation (min): 20 min  Past Medical History:  Past Medical History  Diagnosis Date  . Hyperlipidemia     takes Lipitor daily  . DVT (deep venous thrombosis)   . Stroke   . Parkinson's disease     takes Sinemet daily  . Tibia/fibula fracture 01/04/2012    Treated with long leg casting. Cast change in hospital 02/27/12   . Skin ulcer of great toe 02/27/2012    Right great toe   . Hematochezia 03/09/2012  . Atrial fibrillation 03/09/2012    Seen on older EKG as well but family not aware.   . Macrocytic anemia 03/09/2012  . Diverticulosis 03/10/2012  . End stage renal disease on dialysis     [facility]- MWF  . Hypertension     takes Coreg daily  . Dementia     takes Aricept daily  . Diabetes mellitus     takes Lantus and Novolog  . Seasonal allergies     takes Allegra daily  . Depression     takes Prozac daily  . Hypothyroidism     takes Synthroid daily  . Diarrhea     takes Immodium prn   Past Surgical History:  Past Surgical History  Procedure Laterality Date  . Dg av dialysis graft declot or    . Av fistula repair  Aug. 2013    Left arm  . Cholecystectomy    . Colon surgery      Aprrox 2006 at [facility] after she had a colonoscopy  . Colonoscopy  03/10/2012    Procedure: COLONOSCOPY;  Surgeon: Malissa Hippo, MD;  [facility];  Service: Endoscopy;  Laterality: N/A;  . Esophagogastroduodenoscopy  03/10/2012    Procedure: ESOPHAGOGASTRODUODENOSCOPY (EGD);  Surgeon: Malissa Hippo, MD;  [facility];  Service: Endoscopy;  Laterality: N/A;  EGD if TCS normal.  . Ligation of arteriovenous  fistula Left 03/24/2012    Procedure: LIGATION OF ARTERIOVENOUS  FISTULA;  Surgeon: Fransisco Hertz, MD;  Location: [facility] OR;  Service: Vascular;  Laterality: Left;  . Av fistula placement Right  04/28/2012    Procedure: INSERTION OF ARTERIOVENOUS (AV) GORE-TEX GRAFT ARM;  Surgeon: Sherren Kerns, MD;  Location: [facility] OR;  Service: Vascular;  Laterality: Right;   HPI:  79 year old female admitted s/p syncopal episode following dialysis. MRI negative for acute CVA.    Assessment / Plan / Recommendation Clinical Impression  Patient presents with inconsistent swallowing function characterized by fluctuations in oral transit time, improved with liquids and soft solids however with continued lingual pumping, timing of swallow response, and intermittent coughing despite clear vocal quality post swallow consistently. Daughter reported coughing through out the day today without po intake due to "allergies". She also reported to this SLP that patient does not have Parkinson's disease despite what is listed in the medical history however does report pocketing of po solids when tired prior to admission, question relation to h/o dementia. Unclear at this time exactly what baseline cognitive and swallowing function is. Will continue current diet however f/u closely at bedside to determine need for solid consistency changes and/or further testing.     Aspiration Risk  Mild    Diet Recommendation Regular;Thin liquid   Liquid Administration via: Cup;Straw Medication Administration: Whole meds with puree (vs crushed if needed) Supervision: Patient able to self  feed;Full supervision/cueing for compensatory strategies Compensations: Slow rate;Small sips/bites;Check for pocketing Postural Changes and/or Swallow Maneuvers: Seated upright 90 degrees;Upright 30-60 min after meal    Other  Recommendations Oral Care Recommendations: Oral care BID   Follow Up Recommendations   (TBD)    Frequency and Duration min 2x/week  2 weeks   Pertinent Vitals/Pain None reported    SLP Swallow Goals Patient will consume recommended diet without observed clinical signs of aspiration with: Minimal assistance Swallow  Study Goal #1 - Progress:  (new goal) Patient will utilize recommended strategies during swallow to increase swallowing safety with: Minimal assistance Swallow Study Goal #2 - Progress:  (new goal)   Swallow Study    General HPI: 79 year old female admitted s/p syncopal episode following dialysis. MRI negative for acute CVA.  Type of Study: Bedside swallow evaluation Previous Swallow Assessment: none reported Diet Prior to this Study: Regular;Thin liquids Temperature Spikes Noted: No Respiratory Status: Room air History of Recent Intubation: No Behavior/Cognition: Lethargic;Requires cueing;Decreased sustained attention Oral Cavity - Dentition: Adequate natural dentition Self-Feeding Abilities: Able to feed self Patient Positioning: Upright in bed Baseline Vocal Quality: Low vocal intensity;Breathy Volitional Cough: Cognitively unable to elicit Volitional Swallow: Unable to elicit (excessive tongue pumping)    Oral/Motor/Sensory Function Overall Oral Motor/Sensory Function:  (generalized weakness)   Ice Chips Ice chips: Not tested   Thin Liquid Thin Liquid: Impaired Presentation: Straw Oral Phase Impairments: Impaired anterior to posterior transit Oral Phase Functional Implications: Prolonged oral transit (lingual pumping) Pharyngeal  Phase Impairments: Multiple swallows    Nectar Thick Nectar Thick Liquid: Not tested   Honey Thick Honey Thick Liquid: Not tested   Puree Puree: Impaired Presentation: Spoon Oral Phase Impairments: Impaired anterior to posterior transit Oral Phase Functional Implications: Oral holding;Prolonged oral transit (facial grimacing, lingual pumping) Pharyngeal Phase Impairments: Multiple swallows   Solid   GO Functional Assessment Tool Used: skilled clinical judgement Functional Limitations: Swallowing Swallow Current Status (Z6109): At least 20 percent but less than 40 percent impaired, limited or restricted Swallow Goal Status [phone]): At least 1  percent but less than 20 percent impaired, limited or restricted  Solid: Impaired Presentation: Self Fed Oral Phase Impairments: Impaired anterior to posterior transit (lingual pumping) Oral Phase Functional Implications:  (mild oral transit delay, improved from pureed solids)      Ferdinand Lango MA, CCC-SLP [phone]  Katrina Meyer 06/04/2012,4:32 PM

## 2012-06-04 NOTE — Progress Notes (Signed)
EEG completed.

## 2012-06-04 NOTE — Consult Note (Signed)
WOC consult Note Reason for Consult: Consult requested for bilat heels.   Wound type: Left heel, right heel and right anterior foot all with unstageable wounds Pressure Ulcer POA: Yes Measurement: Left heel 30% soft eschar, 70% red, small tan drainage, no odor. Right heel 3X3.5cm 100% dry eschar without odor or drainage Right anterior foot 2.2X2cm 100% dry eschar without odor or drainage  Dressing procedure/placement/frequency: It is best practice to leave dry stable eschar intact to lower extremities with no further topical treatment.  Protect from further injury with kerlex.  Moist gauze to left heel to assist with removal of nonviable tissue. Float heels to reduce pressure.  Please re-consult if further assistance is needed.  Thank-you,  Cammie Mcgee MSN, RN, CWOCN, [place], CNS [phone]

## 2012-06-04 NOTE — Progress Notes (Signed)
INITIAL NUTRITION ASSESSMENT  Pt meets criteria for SEVERE MALNUTRITION in the context of chronic illness as evidenced by 6% weight loss x 1 month and intake of < 75% of her needs for > 1 month.  DOCUMENTATION CODES Per approved criteria  -Severe malnutrition in the context of chronic illness   INTERVENTION: Nepro Shake po TID, each supplement provides 425 kcal and 19 grams protein.  NUTRITION DIAGNOSIS: Unintentional weight loss related to poor appetite as evidenced by 6% weight loss x 1 month.   Goal: Pt to meet >/= 90% of their estimated nutrition needs  Monitor:  PO intake, supplement acceptance, weight trend, labs  Reason for Assessment: Pt identified as at nutrition risk on the Malnutrition Screen Tool  79 y.o. female  Admitting Dx: Syncope  ASSESSMENT: Pt admitted for syncopal episode. Pt was in HD this am spoke with daughter in pt's room. Per daughter pt lives at home with caregivers. Daughter confirms weight loss and that HD center had recently mentioned this as well. Pt has not been eating much lately. 24 hr recall: pt does not eat breakfast as she is not a morning person; pt has caregiver make lunch and supper and she has been eating < 50% of this recently. Per daughter when pt's appetite is poor they allow her to eat anything she wants. Per daughter pt has been constipated due to over use of anti-diarrheal medication. They recently started a BM chart to help with this. Pt seen in HD Nutrition-focused physical exam WNL. Pt is not very talkative. Just states she doesn't feel well today. She is not able to answer any questions.  Pt did not eat Breakfast this am as she was in HD.  HD today kept pt a even fluid balance. Pt had hypokalemia after HD.   Nutrition Focused Physical Exam:  Subcutaneous Fat:  Orbital Region: WNL Upper Arm Region: WNL Thoracic and Lumbar Region: WNL  Muscle:  Temple Region: WNL Clavicle Bone Region: WNL Clavicle and Acromion Bone Region:  WNL Scapular Bone Region: NA Dorsal Hand: WNL Patellar Region: WNL Anterior Thigh Region: WNL Posterior Calf Region: WNL  Edema: not present    Height: Ht Readings from Last 1 Encounters:  06/03/12 5\' 9"  (1.753 m)    Weight: Wt Readings from Last 1 Encounters:  06/03/12 151 lb 0.2 oz (68.5 kg)    Ideal Body Weight: 65.9 kg  % Ideal Body Weight: 104%  Wt Readings from Last 10 Encounters:  06/03/12 151 lb 0.2 oz (68.5 kg)  05/30/12 151 lb (68.493 kg)  04/24/12 170 lb (77.111 kg)  04/24/12 170 lb (77.111 kg)  03/26/12 170 lb 12.7 oz (77.47 kg)  03/24/12 164 lb 10.9 oz (74.7 kg)  03/24/12 164 lb 10.9 oz (74.7 kg)  03/12/12 181 lb 3.2 oz (82.192 kg)  03/12/12 181 lb 3.2 oz (82.192 kg)  02/27/12 162 lb 0.6 oz (73.5 kg)    Usual Body Weight: 160-165 lb dry weight per daughter  % Usual Body Weight: 94%  BMI:  Body mass index is 22.29 kg/(m^2).  Estimated Nutritional Needs: Kcal: 1800-2000 Protein: 90-100 grams Fluid: 1.2 L/day  Skin: stage II heel pressure ulcer  Diet Order: Renal  EDUCATION NEEDS: -No education needs identified at this time  No intake or output data in the 24 hours ending 06/04/12 0849  Last BM: PTA   Labs:   Recent Labs Lab 05/30/12 1735 06/03/12 1838 06/03/12 2355 06/04/12 0608  NA 137 139  --  137  K 3.1*  2.7*  --  3.2*  CL 95* 96  --  100  CO2 31 29  --  29  BUN 17 7  --  13  CREATININE 3.38* 1.76* 2.24* 2.59*  CALCIUM 10.6* 9.0  --  9.4  MG  --   --   --  2.0  GLUCOSE 177* 181*  --  159*    CBG (last 3)   Recent Labs  06/03/12 2019 06/04/12 0011  GLUCAP 163* 177*   Lab Results  Component Value Date   HGBA1C 6.6* 03/09/2012   Scheduled Meds: . atorvastatin  10 mg Oral Daily  . calcium acetate (Phos Binder)  1,334 mg Oral TID WC  . carbidopa-levodopa  1.5 tablet Oral TID  . carvedilol  12.5 mg Oral BID WC  . cinacalcet  30 mg Oral Q breakfast  . enoxaparin (LOVENOX) injection  30 mg Subcutaneous Q24H  .  FLUoxetine  40 mg Oral Daily  . insulin aspart  0-9 Units Subcutaneous TID WC  . insulin glargine  5 Units Subcutaneous QHS  . levothyroxine  25 mcg Oral QAC breakfast  . loratadine  10 mg Oral Daily  . multivitamin  1 tablet Oral Daily  . potassium chloride  20 mEq Oral Once    Continuous Infusions: . sodium chloride 50 mL/hr at 06/03/12 2353    Past Medical History  Diagnosis Date  . Hyperlipidemia     takes Lipitor daily  . DVT (deep venous thrombosis)   . Stroke   . Parkinson's disease     takes Sinemet daily  . Tibia/fibula fracture 01/04/2012    Treated with long leg casting. Cast change in hospital 02/27/12   . Skin ulcer of great toe 02/27/2012    Right great toe   . Hematochezia 03/09/2012  . Atrial fibrillation 03/09/2012    Seen on older EKG as well but family not aware.   . Macrocytic anemia 03/09/2012  . Diverticulosis 03/10/2012  . End stage renal disease on dialysis     [facility]- MWF  . Hypertension     takes Coreg daily  . Dementia     takes Aricept daily  . Diabetes mellitus     takes Lantus and Novolog  . Seasonal allergies     takes Allegra daily  . Depression     takes Prozac daily  . Hypothyroidism     takes Synthroid daily  . Diarrhea     takes Immodium prn    Past Surgical History  Procedure Laterality Date  . Dg av dialysis graft declot or    . Av fistula repair  Aug. 2013    Left arm  . Cholecystectomy    . Colon surgery      Aprrox 2006 at [facility] after she had a colonoscopy  . Colonoscopy  03/10/2012    Procedure: COLONOSCOPY;  Surgeon: Malissa Hippo, MD;  [facility];  Service: Endoscopy;  Laterality: N/A;  . Esophagogastroduodenoscopy  03/10/2012    Procedure: ESOPHAGOGASTRODUODENOSCOPY (EGD);  Surgeon: Malissa Hippo, MD;  Location: [facility];  Service: Endoscopy;  Laterality: N/A;  EGD if TCS normal.  . Ligation of arteriovenous  fistula Left 03/24/2012    Procedure: LIGATION OF ARTERIOVENOUS  FISTULA;  Surgeon:  Fransisco Hertz, MD;  Location: [facility] OR;  Service: Vascular;  Laterality: Left;  . Av fistula placement Right 04/28/2012    Procedure: INSERTION OF ARTERIOVENOUS (AV) GORE-TEX GRAFT ARM;  Surgeon: Sherren Kerns, MD;  Location: [facility] OR;  Service: Vascular;  Laterality: Right;    Kendell Bane RD, LDN, CNSC [phone] Pager [phone] After Hours Pager

## 2012-06-04 NOTE — Progress Notes (Signed)
[facility] PROGRESS NOTE  Katrina Meyer HYQ:[id number] DOB: 01-28-1933 DOA: 06/03/2012 PCP: Ignatius Specking., MD  Assessment/Plan: 1-Syncope: Unclear etiology, could be secondary to hypokalemia, arrythmia, Hypotension. D dimer elevated. Will check CT angio. Will check ECHO. MRI ordered.  2-Atrial fibrillation/ Non sustain SVT: Patient asymptomatic. Replace K. Mg was normal. Check ECHO. Cycle cardiac enzymes. Continue with coreg.  3-ESRD on hemodialysis: renal Consulted.  4-Diabetes mellitus type 2: SSI, Lantus.  5-Parkinson's disease ; Continue with Carbidopa.  6-Bilateral heel decubitus ulcers - wound team consulted 7-Right Tibial fracture: I informed Dr Otelia Sergeant office of patient admission.      Code Status: Full Family Communication: None at bedside.  Disposition Plan: to be determine. Probably back home when stable.    Consultants:  Renal.  Procedures:  none  Antibiotics:  none  HPI/Subjective: very soft speech, no chest pain, no dyspnea, no abdominal pain.   Objective: Filed Vitals:   06/03/12 2108 06/03/12 2308 06/04/12 0230 06/04/12 0700  BP: 152/128 120/56 128/50 138/41  Pulse: 93 114 55 66  Temp:  98 F (36.7 C) 97.8 F (36.6 C) 98 F (36.7 C)  TempSrc:  Oral Oral Oral  Resp: 16 16 18 18   Height:  5\' 9"  (1.753 m)    Weight:  68.5 kg (151 lb 0.2 oz)    SpO2: 97% 100% 96% 94%   No intake or output data in the 24 hours ending 06/04/12 0830 Filed Weights   06/03/12 2308  Weight: 68.5 kg (151 lb 0.2 oz)    Exam:   General:  No distress.  Cardiovascular: S 1, S 2 RRR  Respiratory: CTA  Abdomen: Bs present, soft, NT  Musculoskeletal: trace edema, bilateral ankle dressing.   Data Reviewed: Basic Metabolic Panel:  Recent Labs Lab 05/30/12 1735 06/03/12 1838 06/03/12 2355 06/04/12 0608  NA 137 139  --  137  K 3.1* 2.7*  --  3.2*  CL 95* 96  --  100  CO2 31 29  --  29  GLUCOSE 177* 181*  --  159*  BUN 17 7  --  13  CREATININE 3.38*  1.76* 2.24* 2.59*  CALCIUM 10.6* 9.0  --  9.4  MG  --   --   --  2.0   Liver Function Tests:  Recent Labs Lab 05/30/12 1735 06/03/12 1838 06/04/12 0608  AST 18 19 17   ALT <5 <5 <5  ALKPHOS 116 112 109  BILITOT 0.4 0.5 0.3  PROT 5.6* 5.8* 5.4*  ALBUMIN 2.5* 2.6* 2.4*   No results found for this basename: LIPASE, AMYLASE,  in the last 168 hours No results found for this basename: AMMONIA,  in the last 168 hours CBC:  Recent Labs Lab 05/30/12 1735 06/03/12 1838 06/03/12 2355 06/04/12 0608  WBC 10.5 11.9* 10.4 8.8  NEUTROABS 7.9*  --   --  6.0  HGB 10.6* 11.2* 10.0* 9.6*  HCT 33.7* 33.9* 30.9* 28.9*  MCV 97.7 93.1 93.4 92.9  PLT 128* 132* 136* PLATELET CLUMPS NOTED ON SMEAR, UNABLE TO ESTIMATE   Cardiac Enzymes:  Recent Labs Lab 06/03/12 2356  TROPONINI <0.30   BNP (last 3 results) No results found for this basename: PROBNP,  in the last 8760 hours CBG:  Recent Labs Lab 06/03/12 2019 06/04/12 0011  GLUCAP 163* 177*    Recent Results (from the past 240 hour(s))  MRSA PCR SCREENING     Status: Abnormal   Collection Time    06/04/12  1:59 AM  Result Value Range Status   MRSA by PCR POSITIVE (*) NEGATIVE Final   Comment:            The GeneXpert MRSA Assay (FDA     approved for NASAL specimens     only), is one component of a     comprehensive MRSA colonization     surveillance program. It is not     intended to diagnose MRSA     infection nor to guide or     monitor treatment for     MRSA infections.     RESULT CALLED TO, READ BACK BY AND VERIFIED WITH:     CALLED TO RN Burnett Corrente [phone] @[phone]  THANEY     Studies: Ct Head Wo Contrast  06/03/2012  *RADIOLOGY REPORT*  Clinical Data: Unresponsive patient following dialysis.  CT HEAD WITHOUT CONTRAST  Technique:  Contiguous axial images were obtained from the base of the skull through the vertex without contrast.  Comparison: 03/09/2012.  Findings: No mass lesion, mass effect, midline shift,  hydrocephalus, hemorrhage.  No acute territorial cortical ischemia/infarct. Atrophy and chronic ischemic white matter disease is present.  Intracranial atherosclerosis.  Mastoid air cells clear.  Scattered old lacunar infarcts are similar to prior.  IMPRESSION: Atrophy, chronic ischemic white matter disease and old lacunar infarcts without acute intracranial abnormality.   Original Report Authenticated By: Andreas Newport, M.D.    Dg Chest Port 1 View  06/03/2012  *RADIOLOGY REPORT*  Clinical Data: Syncope after dialysis  PORTABLE CHEST - 1 VIEW  Comparison: 05/30/2012  Findings: 1900 hours.  Low volume film. The cardiopericardial silhouette is enlarged. The lungs are clear without focal infiltrate, edema, pneumothorax or pleural effusion.  Right IJ dialysis catheter tip projects at the distal SVC level. Telemetry leads overlie the chest.  IMPRESSION: Low volume film without acute cardiopulmonary findings.   Original Report Authenticated By: Kennith Center, M.D.     Scheduled Meds: . atorvastatin  10 mg Oral Daily  . calcium acetate (Phos Binder)  1,334 mg Oral TID WC  . carbidopa-levodopa  1.5 tablet Oral TID  . carvedilol  12.5 mg Oral BID WC  . cinacalcet  30 mg Oral Q breakfast  . donepezil  10 mg Oral Daily  . enoxaparin (LOVENOX) injection  30 mg Subcutaneous Q24H  . FLUoxetine  40 mg Oral Daily  . insulin aspart  0-9 Units Subcutaneous TID WC  . insulin glargine  5 Units Subcutaneous QHS  . levothyroxine  25 mcg Oral QAC breakfast  . loratadine  10 mg Oral Daily  . multivitamin  1 tablet Oral Daily   Continuous Infusions: . sodium chloride 50 mL/hr at 06/03/12 2353    Principal Problem:   Syncope Active Problems:   DM   ESRD   Atrial fibrillation   Parkinson's disease   Decubitus ulcer, heel    Time spent: 35 minutes.    Katrina Meyer  [facility] Pager [phone]. If 7PM-7AM, please contact night-coverage at [email or web address], password [facility] 06/04/2012, 8:30 AM  LOS: 1  day

## 2012-06-04 NOTE — Progress Notes (Signed)
Patient have been having some non sub stain beats of Vtach and SVT. Called Dr. Stann Mainland continue to monitor. Almond Lint

## 2012-06-04 NOTE — ED Provider Notes (Signed)
I saw and evaluated the patient, reviewed the resident's note and I agree with the findings and plan. If applicable, I agree with the resident's interpretation of the EKG.  If applicable, I was present for critical portions of any procedures performed.  Episode of decreased responsiveness after dialysis today. Lasting a few seconds. Back to nonverbal baseline after a few seconds of bystander CPR. Has been bedbound for RLE fracture. Vitals stable. No hypoxia or tachycardia.  No apparent CP or SOB. Consider arrhythmia, TIA, hypovolemia, hypoglycemia. Hx DVT and immobilization. PE considered as well.  Glynn Octave, MD 06/04/12 1152

## 2012-06-04 NOTE — Consult Note (Signed)
[facility] Renal Consultation Note    Indication for Consultation:  Management of ESRD/hemodialysis; anemia, hypertension/volume and secondary hyperparathyroidism  HPI: Katrina Meyer is a 79 y.o. female with ESRD on MWF dialysis in [place] followed by [facility] who presented to ER yesterday following syncopal episode that occurred after she arrived home, but had not yet gotten out of the car.  [name] has been on HD 15 years per her daughter and transferred to [facility] in November 2013 for "consolidation" of care.  Review of her HD treatment notes over the past several weeks notes serial lowering of her EDW due to the fact she is leaving below her EDW.  Her current EDW is 64, however the lowest she has attained is 69.5 and yesterday there was no change in pre and post HD weights even though she ran her full treatments.  According to the H and P, the pt had a brief LOC at home after dialyisis. CBG was 130. EKG showed afib in the ED, Head CT was neg for acute findings. The patient has bilateral heel ulcers, and has a splint for prior tib-fib fx; she was supposed to have had it removed today by Dr. Otelia Sergeant per daughter.  According to her daughter, she "talked to her caregiver all the way home from dialysis" and then was noted to be unresponsive when it was time for her to get out of the car and the caregiver called 911.  The pt has 24 hour medical care from private caregivers and lives at home.  She was recently seen at [facility] for constipation and thinks it was because her caregivers had given her too much antidiarrheal medicine.  She states her mother's bowels are ok now.  She is incontinent at times.  The daughter states they had trouble using her graft Monday; it infiltrated and they have been icing it.  The catheter was used Wednesday. Her mother does not make urine.  Past Medical History  Diagnosis Date  . Hyperlipidemia     takes Lipitor daily  . DVT (deep venous  thrombosis)   . Stroke   . Parkinson's disease     takes Sinemet daily  . Tibia/fibula fracture 01/04/2012    Treated with long leg casting. Cast change in hospital 02/27/12   . Skin ulcer of great toe 02/27/2012    Right great toe   . Hematochezia 03/09/2012  . Atrial fibrillation 03/09/2012    Seen on older EKG as well but family not aware.   . Macrocytic anemia 03/09/2012  . Diverticulosis 03/10/2012  . End stage renal disease on dialysis     [facility]- MWF  . Hypertension     takes Coreg daily  . Dementia     takes Aricept daily  . Diabetes mellitus     takes Lantus and Novolog  . Seasonal allergies     takes Allegra daily  . Depression     takes Prozac daily  . Hypothyroidism     takes Synthroid daily  . Diarrhea     takes Immodium prn   Past Surgical History  Procedure Laterality Date  . Dg av dialysis graft declot or    . Av fistula repair  Aug. 2013    Left arm  . Cholecystectomy    . Colon surgery      Aprrox 2006 at [facility] after she had a colonoscopy  . Colonoscopy  03/10/2012    Procedure: COLONOSCOPY;  Surgeon: Malissa Hippo, MD;  [facility];  Service: Endoscopy;  Laterality: N/A;  . Esophagogastroduodenoscopy  03/10/2012    Procedure: ESOPHAGOGASTRODUODENOSCOPY (EGD);  Surgeon: Malissa Hippo, MD;  Location: [facility];  Service: Endoscopy;  Laterality: N/A;  EGD if TCS normal.  . Ligation of arteriovenous  fistula Left 03/24/2012    Procedure: LIGATION OF ARTERIOVENOUS  FISTULA;  Surgeon: Fransisco Hertz, MD;  Location: [facility] OR;  Service: Vascular;  Laterality: Left;  . Av fistula placement Right 04/28/2012    Procedure: INSERTION OF ARTERIOVENOUS (AV) GORE-TEX GRAFT ARM;  Surgeon: Sherren Kerns, MD;  Location: [facility] OR;  Service: Vascular;  Laterality: Right;   Family History  Problem Relation Age of Onset  . Stroke Mother   . Diabetes Mother   . Cancer Mother   . Hypertension Mother   . Coronary artery disease Father   . Diabetes Sister     Social History: Lives at home with 24/7 caregivers. Family owns nursing home in [place] area. Daughter very supportive.Grew up and lives in [place], [place].  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 Married 56 yr.    reports that she has been passively smoking.  She has never used smokeless tobacco. She reports that she does not drink alcohol or use illicit drugs. Allergies  Allergen Reactions  . Hydralazine Rash   Prior to Admission medications   Medication Sig Start Date End Date Taking? Authorizing Provider  atorvastatin (LIPITOR) 10 MG tablet Take 10 mg by mouth daily.    Yes Historical Provider, MD  calcium acetate, Phos Binder, (PHOSLYRA) 667 MG/5ML SOLN Take 667-1,334 mg by mouth 3 (three) times daily with meals. Takes with snacks and with meals   Yes Historical Provider, MD  carbidopa-levodopa (SINEMET IR) 25-100 MG per tablet Take 1.5 tablets by mouth 3 (three) times daily.   Yes Historical Provider, MD  carvedilol (COREG) 12.5 MG tablet Take 12.5 mg by mouth 2 (two) times daily with a meal.   Yes Historical Provider, MD  cinacalcet (SENSIPAR) 30 MG tablet Take 30 mg by mouth daily.   Yes Historical Provider, MD  donepezil (ARICEPT) 10 MG tablet Take 10 mg by mouth daily.   Yes Historical Provider, MD  fexofenadine (ALLEGRA) 180 MG tablet Take 180 mg by mouth daily.   Yes Historical Provider, MD  FLUoxetine (PROZAC) 40 MG capsule Take 40 mg by mouth daily.   Yes Historical Provider, MD  insulin glargine (LANTUS) 100 UNIT/ML injection Inject 5 Units into the skin at bedtime.    Yes Historical Provider, MD  insulin NPH-regular (NOVOLIN 70/30) (70-30) 100 UNIT/ML injection Inject 1-5 Units into the skin 3 (three) times daily. Per sliding scale   Yes Historical Provider, MD  levothyroxine (SYNTHROID, LEVOTHROID) 25 MCG tablet Take 25 mcg by mouth every morning.   Yes Historical Provider, MD  multivitamin  (RENA-VIT) TABS tablet Take 1 tablet by mouth daily.   Yes Historical Provider, MD  traMADol (ULTRAM) 50 MG tablet Take 50 mg by mouth every 6 (six) hours as needed for pain.   Yes Historical Provider, MD   Current Facility-Administered Medications  Medication Dose Route Frequency Provider Last Rate Last Dose  . atorvastatin (LIPITOR) tablet 10 mg  10 mg Oral Daily Eduard Clos, MD      . calcium acetate (Phos Binder) [facility]) 667 MG/5ML oral solution 1,334 mg  1,334 mg Oral TID WC Arshad  Demetra Shiner, MD      . calcium acetate (Phos Binder) [facility]) 667 MG/5ML oral solution 667 mg  667 mg Oral PRN Eduard Clos, MD      . carbidopa-levodopa (SINEMET IR) 25-100 MG per tablet immediate release 1.5 tablet  1.5 tablet Oral TID Eduard Clos, MD   1.5 tablet at 06/04/12 0017  . carvedilol (COREG) tablet 12.5 mg  12.5 mg Oral BID WC Eduard Clos, MD      . cinacalcet (SENSIPAR) tablet 30 mg  30 mg Oral Q breakfast Eduard Clos, MD      . enoxaparin (LOVENOX) injection 30 mg  30 mg Subcutaneous Q24H Eduard Clos, MD      . FLUoxetine (PROZAC) tablet 40 mg  40 mg Oral Daily Eduard Clos, MD      . insulin aspart (novoLOG) injection 0-9 Units  0-9 Units Subcutaneous TID WC Eduard Clos, MD      . insulin glargine (LANTUS) injection 5 Units  5 Units Subcutaneous QHS Eduard Clos, MD   5 Units at 06/04/12 0018  . levothyroxine (SYNTHROID, LEVOTHROID) tablet 25 mcg  25 mcg Oral QAC breakfast Eduard Clos, MD      . loratadine (CLARITIN) tablet 10 mg  10 mg Oral Daily Eduard Clos, MD      . multivitamin (RENA-VIT) tablet 1 tablet  1 tablet Oral Daily Eduard Clos, MD      . potassium chloride SA (K-DUR,KLOR-CON) CR tablet 20 mEq  20 mEq Oral Once Belkys A Regalado, MD      . traMADol (ULTRAM) tablet 50 mg  50 mg Oral Q6H PRN Eduard Clos, MD       Labs: Basic Metabolic Panel:  Recent Labs Lab 05/30/12 1735  06/03/12 1838 06/03/12 2355 06/04/12 0608  NA 137 139  --  137  K 3.1* 2.7*  --  3.2*  CL 95* 96  --  100  CO2 31 29  --  29  GLUCOSE 177* 181*  --  159*  BUN 17 7  --  13  CREATININE 3.38* 1.76* 2.24* 2.59*  CALCIUM 10.6* 9.0  --  9.4   Liver Function Tests:  Recent Labs Lab 05/30/12 1735 06/03/12 1838 06/04/12 0608  AST 18 19 17   ALT <5 <5 <5  ALKPHOS 116 112 109  BILITOT 0.4 0.5 0.3  PROT 5.6* 5.8* 5.4*  ALBUMIN 2.5* 2.6* 2.4*  CBC:  Recent Labs Lab 05/30/12 1735 06/03/12 1838 06/03/12 2355 06/04/12 0608  WBC 10.5 11.9* 10.4 8.8  NEUTROABS 7.9*  --   --  6.0  HGB 10.6* 11.2* 10.0* 9.6*  HCT 33.7* 33.9* 30.9* 28.9*  MCV 97.7 93.1 93.4 92.9  PLT 128* 132* 136* PLATELET CLUMPS NOTED ON SMEAR, UNABLE TO ESTIMATE   Cardiac Enzymes:  Recent Labs Lab 06/03/12 2356 06/04/12 1041  TROPONINI <0.30 <0.30   CBG:  Recent Labs Lab 06/03/12 2019 06/04/12 0011 06/04/12 1147  GLUCAP 163* 177* 98   Studies/Results: Ct Head Wo Contrast  06/03/2012  *RADIOLOGY REPORT*  Clinical Data: Unresponsive patient following dialysis.  CT HEAD WITHOUT CONTRAST  Technique:  Contiguous axial images were obtained from the base of the skull through the vertex without contrast.  Comparison: 03/09/2012.  Findings: No mass lesion, mass effect, midline shift, hydrocephalus, hemorrhage.  No acute territorial cortical ischemia/infarct. Atrophy and chronic ischemic white matter disease is present.  Intracranial atherosclerosis.  Mastoid air cells clear.  Scattered old  lacunar infarcts are similar to prior.  IMPRESSION: Atrophy, chronic ischemic white matter disease and old lacunar infarcts without acute intracranial abnormality.   Original Report Authenticated By: Andreas Newport, M.D.    Mri Brain Without Contrast  06/04/2012  *RADIOLOGY REPORT*  Clinical Data: Dialysis patient with episode of loss of consciousness.  MRI HEAD WITHOUT CONTRAST  Technique:  Multiplanar, multiecho pulse sequences  of the brain and surrounding structures were obtained according to standard protocol without intravenous contrast.  Comparison: 06/03/2012 CT.  12/13/2008 MR.  Findings: No acute infarct.  Prominent small vessel disease type changes.  Remote right cerebellar infarct.  Remote left basal ganglia infarcts (partially hemorrhagic).  Global atrophy without hydrocephalus.  No intracranial mass lesion detected on this unenhanced exam.  No intracranial hemorrhage.  Major intracranial vascular structures are patent.  Prominent appearance of right Meckel's cave unchanged and felt to be an incidental finding.  IMPRESSION: No acute infarct.  Please see above.   Original Report Authenticated By: Lacy Duverney, M.D.    Dg Chest Port 1 View  06/03/2012  *RADIOLOGY REPORT*  Clinical Data: Syncope after dialysis  PORTABLE CHEST - 1 VIEW  Comparison: 05/30/2012  Findings: 1900 hours.  Low volume film. The cardiopericardial silhouette is enlarged. The lungs are clear without focal infiltrate, edema, pneumothorax or pleural effusion.  Right IJ dialysis catheter tip projects at the distal SVC level. Telemetry leads overlie the chest.  IMPRESSION: Low volume film without acute cardiopulmonary findings.   Original Report Authenticated By: Kennith Center, M.D.    ROS: Limited due to paucity of verbalization by patient. Has pain in right upper arm (graft).  According to the charge RN at her dialysis center, the patient rarely says anything and if she does it is in a whisper.  Physical Exam: Filed Vitals:   06/03/12 2308 06/04/12 0230 06/04/12 0700 06/04/12 1049  BP: 120/56 128/50 138/41 134/102  Pulse: 114 55 66 72  Temp: 98 F (36.7 C) 97.8 F (36.6 C) 98 F (36.7 C) 98.7 F (37.1 C)  TempSrc: Oral Oral Oral Oral  Resp: 16 18 18 18   Height: 5\' 9"  (1.753 m)     Weight: 68.5 kg (151 lb 0.2 oz)     SpO2: 100% 96% 94%      General:  Elderly woman being bathed, NAD wearing O2 (per daughter, no O2 at home) Head:   normocephalic, atraumatic, sclera non-icteric, mucus membranes are moist Neck: Supple. JVD not elevated. Lungs: Clear bilaterally to auscultation without wheezes, rales, or rhonchi. Breathing is unlabored. R IJ TDC in place Heart: .irregular Abdomen: Soft, non-tender, non-distended with normoactive bowel sounds. No rebound/guarding. No obvious abdominal masses. M-S:  Bilateral upper and lower extremity muscle atrophy Lower extremities: without edema; heels wrapped.  Brace R LE Neuro: Alert Speech very soft.  "at [facility]"; cannot verbalize why she is here Dialysis Access: right upper AVGG with hematoma + bruit; right I-J  Dialysis Orders: Center: Early MWF EDW 69 (got to 69 4/25 and 69.5 since then) minimal weight gains. 3.5 hours 2 K 2 Ca 400/800 just started using right upper AVGG, still has right I-J, heparin 2600 no Epo venofer 100/week with tsat of 59% hectorol 1 Recent labs:  Hgb 10.8 4/30 49% sat mid April  Assessment/Plan: 1. syncopal event post dialysis at home 4/30 - would raise EDW at the least to 69.5 or 70 at discharge; work up in progress. 2D Echo 55-60% February 2014; enzymes negative x 2,  For CT angio this  afternoon 2. ESRD -  MWF -HD tomorrow  keep even; CXR clear; ; hypokalemic after dialysis yesterday tx with po KCl; may need to change to added K bath at the expense of Ca, but would prefer to liberalize diet; draw labs pre HD 3. Hypertension/volume  - even if she is nonambulatory can do sitting and supine BP pre and post HD 4. Anemia  - Hgb 9.6; not clear why the decline; repeat Friday; resume Epo- was on 9000 before it was held; hold Fe due to high tsat. If Hgb drops further would do FOBT- had documented + test 03/2012 (colonoscopy with snare polypectomy; multiple diverticula at sigmoid during Feb admission at [facility] 5. Metabolic bone disease -  Prone to hypercalcemia - changed to 2 Ca bath recently; continue hectorol 1( lowered due to ^ C); corrected Ca 10.7-  according to her daughter she was JUST started on sensipar; no GI side effects so far - can titrate up if hypercalcemia persists; on phoslyra for binder 6. Nutrition - renal diet - would add at least one high K food per day at discharge to keep K up so we can keep on 2 Ca bath 7. DM - SSI 8. Atrial fibrillation - stable; on coreg 12.5 bid. Not on anticoagulants 9. Hypothyroidism - on synthroid TSH in range 10. R tib/fib fractures (from Nov 2013)--she was to have seen Dr. Otelia Sergeant today to have brace removed form R LE.  Will ask that he see her while she's here in hospital.    Sheffield Slider, PA-C [facility] Beeper [phone] 06/04/2012, 1:28 PM   I examined Mrs. Valentine and talked with her (a LITTLE) and daughter Anita Laguna).  I agree with plans as outlined above.  Will ask Dr. Otelia Sergeant to see (old R tib/fib fracture) while she's in hospital.

## 2012-06-05 ENCOUNTER — Observation Stay (HOSPITAL_COMMUNITY): Payer: Medicare Other

## 2012-06-05 ENCOUNTER — Encounter (HOSPITAL_COMMUNITY): Payer: Self-pay | Admitting: Specialist

## 2012-06-05 LAB — RENAL FUNCTION PANEL
Albumin: 2.3 g/dL — ABNORMAL LOW (ref 3.5–5.2)
BUN: 17 mg/dL (ref 6–23)
CO2: 31 mEq/L (ref 19–32)
Calcium: 10.3 mg/dL (ref 8.4–10.5)
Chloride: 100 mEq/L (ref 96–112)
Creatinine, Ser: 3.92 mg/dL — ABNORMAL HIGH (ref 0.50–1.10)
GFR calc Af Amer: 12 mL/min — ABNORMAL LOW (ref >90)
GFR calc non Af Amer: 10 mL/min — ABNORMAL LOW (ref >90)
Glucose, Bld: 143 mg/dL — ABNORMAL HIGH (ref 70–99)
Phosphorus: 3.3 mg/dL (ref 2.3–4.6)
Potassium: 3.8 mEq/L (ref 3.5–5.1)
Sodium: 139 mEq/L (ref 135–145)

## 2012-06-05 LAB — CBC
HCT: 28.5 % — ABNORMAL LOW (ref 36.0–46.0)
Hemoglobin: 9.1 g/dL — ABNORMAL LOW (ref 12.0–15.0)
MCH: 30.3 pg (ref 26.0–34.0)
MCHC: 31.9 g/dL (ref 30.0–36.0)
MCV: 95 fL (ref 78.0–100.0)
Platelets: 150 10*3/uL (ref 150–400)
RBC: 3 MIL/uL — ABNORMAL LOW (ref 3.87–5.11)
RDW: 16.9 % — ABNORMAL HIGH (ref 11.5–15.5)
WBC: 7.2 10*3/uL (ref 4.0–10.5)

## 2012-06-05 LAB — GLUCOSE, CAPILLARY
Glucose-Capillary: 143 mg/dL — ABNORMAL HIGH (ref 70–99)
Glucose-Capillary: 88 mg/dL (ref 70–99)
Glucose-Capillary: 156 mg/dL — ABNORMAL HIGH (ref 70–99)

## 2012-06-05 LAB — HEPATITIS B SURFACE ANTIGEN: Hepatitis B Surface Ag: NEGATIVE

## 2012-06-05 MED ORDER — NEPRO/CARBSTEADY PO LIQD
237.0000 mL | ORAL | Status: DC | PRN
  Filled 2012-06-05: qty 237

## 2012-06-05 MED ORDER — LIDOCAINE HCL (PF) 1 % IJ SOLN: 5.0000 mL | INTRAMUSCULAR | Status: DC | PRN

## 2012-06-05 MED ORDER — ALTEPLASE 2 MG IJ SOLR: 2.0000 mg | Freq: Once | INTRAMUSCULAR | Status: DC | PRN

## 2012-06-05 MED ORDER — HEPARIN SODIUM (PORCINE) 1000 UNIT/ML DIALYSIS: 20.0000 [IU]/kg | INTRAMUSCULAR | Status: DC | PRN

## 2012-06-05 MED ORDER — CHLORHEXIDINE GLUCONATE CLOTH 2 % EX PADS
6.0000 | MEDICATED_PAD | Freq: Every day | CUTANEOUS | Status: DC
  Administered 2012-06-05: 6 via TOPICAL

## 2012-06-05 MED ORDER — MUPIROCIN 2 % EX OINT
1.0000 "application " | TOPICAL_OINTMENT | Freq: Two times a day (BID) | CUTANEOUS | Status: DC
  Administered 2012-06-05: 1 via NASAL
  Filled 2012-06-05: qty 22

## 2012-06-05 MED ORDER — SODIUM CHLORIDE 0.9 % IV SOLN: 100.0000 mL | INTRAVENOUS | Status: DC | PRN

## 2012-06-05 MED ORDER — LEVOFLOXACIN 250 MG PO TABS
250.0000 mg | ORAL_TABLET | Freq: Every day | ORAL | Status: DC
  Administered 2012-06-05: 250 mg via ORAL
  Filled 2012-06-05 (×2): qty 1

## 2012-06-05 MED ORDER — PENTAFLUOROPROP-TETRAFLUOROETH EX AERO: 1.0000 "application " | INHALATION_SPRAY | CUTANEOUS | Status: DC | PRN

## 2012-06-05 MED ORDER — LIDOCAINE-PRILOCAINE 2.5-2.5 % EX CREA
1.0000 "application " | TOPICAL_CREAM | CUTANEOUS | Status: DC | PRN
  Filled 2012-06-05: qty 5

## 2012-06-05 MED ORDER — LEVOFLOXACIN 250 MG PO TABS: 250.0000 mg | ORAL_TABLET | Freq: Every day | ORAL | Status: DC

## 2012-06-05 MED ORDER — NEPRO/CARBSTEADY PO LIQD
237.0000 mL | Freq: Three times a day (TID) | ORAL | Status: DC
  Filled 2012-06-05 (×4): qty 237

## 2012-06-05 MED ORDER — HEPARIN SODIUM (PORCINE) 1000 UNIT/ML DIALYSIS: 1000.0000 [IU] | INTRAMUSCULAR | Status: DC | PRN

## 2012-06-05 NOTE — Progress Notes (Signed)
Speech Language Pathology Dysphagia Treatment Patient Details Name: Katrina Meyer MRN: [id number] DOB: 1932-03-07 Today's Date: 06/05/2012 Time: 7829-5621 SLP Time Calculation (min): 23 min  Assessment / Plan / Recommendation Clinical Impression  Diagnostic treatment focused on diet tolerance and need for further assessment/treatment. Patient lethargic after HD, requiring max verbal encouragement to participate and communicate verbally. Patient was however able to self feed clinician provided po trials (regular texture solids, thin liquid) with no overt s/s of aspiration. Oral transit time improved from initial evaluation 5/3, remaining mildly delayed however functional. Noted CXR 5/1 which indicates RML and RLL opacities, likely early PNA. Discussed mentation with daughter and it seems that mentation fluctuated prior to admission. Daugther reported that communication and cognition "depended on the day" with some days in which patient was with little verbal output similar to today. Given fluctuations in mentation resulting in oral holding, cannot r/o episodic aspiration of pos which is likely to be present regardless of texture changes. Cannot r/o aspiration as cause of CXR findings, particularly in light of acute illness of unknown origin. Will continue current diet and monitor briefly for patient/family education.     Diet Recommendation  Continue with Current Diet: Regular;Thin liquid    SLP Plan Continue with current plan of care   Pertinent Vitals/Pain None reported   Swallowing Goals  SLP Swallowing Goals Patient will consume recommended diet without observed clinical signs of aspiration with: Minimal assistance Swallow Study Goal #1 - Progress: Progressing toward goal Patient will utilize recommended strategies during swallow to increase swallowing safety with: Minimal assistance Swallow Study Goal #2 - Progress: Progressing toward goal  General Temperature Spikes Noted:  No Respiratory Status: Room air Behavior/Cognition: Lethargic;Requires cueing;Decreased sustained attention Oral Cavity - Dentition: Adequate natural dentition Patient Positioning: Upright in bed      Dysphagia Treatment Treatment focused on: Skilled observation of diet tolerance Treatment Methods/Modalities: Skilled observation;Differential diagnosis Patient observed directly with PO's: Yes Type of PO's observed: Regular;Thin liquids Feeding: Able to feed self Liquids provided via: Straw;Cup Oral Phase Signs & Symptoms: Prolonged oral phase Pharyngeal Phase Signs & Symptoms: Multiple swallows Type of cueing: Verbal;Tactile;Visual Amount of cueing: Maximal   GO   Ferdinand Lango MA, CCC-SLP [phone]   Zhuri Krass Meryl 06/05/2012, 2:38 PM

## 2012-06-05 NOTE — Procedures (Signed)
Patient was seen on dialysis and the procedure was supervised.  BFR 400  Via  PC  BP is  133/81.   Patient appears to be tolerating treatment well  Katrina Meyer A 06/05/2012

## 2012-06-05 NOTE — Procedures (Signed)
EEG NUMBER:  [id number].  REFERRING PHYSICIAN:  Eduard Clos, MD  INDICATION FOR STUDY:  A 79 year old lady with end-stage renal disease on hemodialysis, who experienced an episode of loss of consciousness of unclear etiology.  Study is being performed to rule out possible new onset seizure disorder.  DESCRIPTION:  This is a routine EEG recording performed during wakefulness.  Predominant background activity consisted of low amplitude, 1-2 Hz diffuse symmetrical continuous delta activity with superimposed 7 hertz rhythmic theta activity which was most prominent posteriorly.  Photic stimulation was not performed.  Hyperventilation was not performed.  No epileptiform discharges were recorded.  There were no areas of disproportionate focal slowing.  INTERPRETATION:  This EEG shows mild to moderate generalized continuous slowing of cerebral activity diffusely which is nonspecific and can be seen with degenerative as well as metabolic encephalopathies.  No evidence of an epileptic disorder was demonstrated.     Noel Christmas, MD    [id number] D:  06/04/2012 17:07:00  T:  06/05/2012 05:00:37  Job #:  [id number]

## 2012-06-05 NOTE — Consult Note (Signed)
Reason for Consult:Follow up Right Tibia-fibula fracture. Referring Physician:Dr. Caryn Section Consulting Physician:NITKA,JAMES E  Orthopedic Diagnosis:1) Right Heel decubitus, Stage 3 2) Right proximal metaphyseal tibia fracture 5 months post injury, adequate callus No clinical signs of motion at the fracture site. 3) High likelihood of osteoporosis due to disuse, ESRD and nutrition.  Katrina Meyer is an 79 y.o. female.with ESRD, h/o stroke and parkinson's disease with PVD. Sustained a right proximal tibia-fibula Fracture 01/04/2012. Seen at the [facility] and evaluated, not felt to be a good surgical with concerns of skin breakdown and healing if Surgery was undertaken, placed in a long leg cast. Cast changed at 6 weeks post injury with clinical and radiographic signs of slow healing But callus forming and replaced in a long leg cast. Unfortunately she developed heel break down due to the cast and signs of right great toe Skin irritation. Cast was discontinued in favor of a knee immobilizer with the stays removed to prevent further skin issues and she has Been followed by Dr. Mills Koller in the wound clinic for her right heel decubitus and areas of skin breakdown over the right medial great Toe and dorsal foot. These are showing gradual slow healing . Previous history of left heel decubitus that has since healed. Admitted to  Medicine for concerns of altered mental status and consult was requested for follow up as she in unable to keep her office appointment Schedule for yesterday 06/04/2012.   Past Medical History  Diagnosis Date  . Hyperlipidemia     takes Lipitor daily  . DVT (deep venous thrombosis)   . Stroke   . Parkinson's disease     takes Sinemet daily  . Tibia/fibula fracture 01/04/2012    Treated with long leg casting. Cast change in hospital 02/27/12   . Skin ulcer of great toe 02/27/2012    Right great toe   . Hematochezia 03/09/2012  . Atrial fibrillation 03/09/2012    Seen on  older EKG as well but family not aware.   . Macrocytic anemia 03/09/2012  . Diverticulosis 03/10/2012  . End stage renal disease on dialysis     [facility]- MWF  . Hypertension     takes Coreg daily  . Dementia     takes Aricept daily  . Diabetes mellitus     takes Lantus and Novolog  . Seasonal allergies     takes Allegra daily  . Depression     takes Prozac daily  . Hypothyroidism     takes Synthroid daily  . Diarrhea     takes Immodium prn    Past Surgical History  Procedure Laterality Date  . Dg av dialysis graft declot or    . Av fistula repair  Aug. 2013    Left arm  . Cholecystectomy    . Colon surgery      Aprrox 2006 at [facility] after she had a colonoscopy  . Colonoscopy  03/10/2012    Procedure: COLONOSCOPY;  Surgeon: Malissa Hippo, MD;  Location: [facility];  Service: Endoscopy;  Laterality: N/A;  . Esophagogastroduodenoscopy  03/10/2012    Procedure: ESOPHAGOGASTRODUODENOSCOPY (EGD);  Surgeon: Malissa Hippo, MD;  Location: [facility] SU[facility];  Service: Endoscopy;  Laterality: N/A;  EGD if TCS normal.  . Ligation of arteriovenous  fistula Left 03/24/2012    Procedure: LIGATION OF ARTERIOVENOUS  FISTULA;  Surgeon: Fransisco Hertz, MD;  Location: [facility] OR;  Service: Vascular;  Laterality: Left;  . Av fistula placement Right 04/28/2012  Procedure: INSERTION OF ARTERIOVENOUS (AV) GORE-TEX GRAFT ARM;  Surgeon: Sherren Kerns, MD;  Location: [facility] OR;  Service: Vascular;  Laterality: Right;    Family History  Problem Relation Age of Onset  . Stroke Mother   . Diabetes Mother   . Cancer Mother   . Hypertension Mother   . Coronary artery disease Father   . Diabetes Sister     Social History:  reports that she has been passively smoking.  She has never used smokeless tobacco. She reports that she does not drink alcohol or use illicit drugs.  Allergies:  Allergies  Allergen Reactions  . Hydralazine Rash    Medications:  Prior to Admission:  Prescriptions prior to  admission  Medication Sig Dispense Refill  . atorvastatin (LIPITOR) 10 MG tablet Take 10 mg by mouth daily.       . calcium acetate, Phos Binder, (PHOSLYRA) 667 MG/5ML SOLN Take 667-1,334 mg by mouth 3 (three) times daily with meals. Takes with snacks and with meals      . carbidopa-levodopa (SINEMET IR) 25-100 MG per tablet Take 1.5 tablets by mouth 3 (three) times daily.      . carvedilol (COREG) 12.5 MG tablet Take 12.5 mg by mouth 2 (two) times daily with a meal.      . cinacalcet (SENSIPAR) 30 MG tablet Take 30 mg by mouth daily.      Marland Kitchen donepezil (ARICEPT) 10 MG tablet Take 10 mg by mouth daily.      . fexofenadine (ALLEGRA) 180 MG tablet Take 180 mg by mouth daily.      Marland Kitchen FLUoxetine (PROZAC) 40 MG capsule Take 40 mg by mouth daily.      . insulin glargine (LANTUS) 100 UNIT/ML injection Inject 5 Units into the skin at bedtime.       . insulin NPH-regular (NOVOLIN 70/30) (70-30) 100 UNIT/ML injection Inject 1-5 Units into the skin 3 (three) times daily. Per sliding scale      . levothyroxine (SYNTHROID, LEVOTHROID) 25 MCG tablet Take 25 mcg by mouth every morning.      . multivitamin (RENA-VIT) TABS tablet Take 1 tablet by mouth daily.      . traMADol (ULTRAM) 50 MG tablet Take 50 mg by mouth every 6 (six) hours as needed for pain.        Results for orders placed during the hospital encounter of 06/03/12 (from the past 48 hour(s))  COMPREHENSIVE METABOLIC PANEL     Status: Abnormal   Collection Time    06/03/12  6:38 PM      Result Value Range   Sodium 139  135 - 145 mEq/L   Potassium 2.7 (*) 3.5 - 5.1 mEq/L   Comment: CRITICAL RESULT CALLED TO, READ BACK BY AND VERIFIED WITH:     J DAVIS,RN 1945 06/03/12 WBOND   Chloride 96  96 - 112 mEq/L   CO2 29  19 - 32 mEq/L   Glucose, Bld 181 (*) 70 - 99 mg/dL   BUN 7  6 - 23 mg/dL   Creatinine, Ser 8.11 (*) 0.50 - 1.10 mg/dL   Calcium 9.0  8.4 - 91.4 mg/dL   Total Protein 5.8 (*) 6.0 - 8.3 g/dL   Albumin 2.6 (*) 3.5 - 5.2 g/dL    AST 19  0 - 37 U/L   ALT <5  0 - 35 U/L   Alkaline Phosphatase 112  39 - 117 U/L   Total Bilirubin 0.5  0.3 - 1.2  mg/dL   GFR calc non Af Amer 27 (*) >90 mL/min   GFR calc Af Amer 31 (*) >90 mL/min   Comment:            The eGFR has been calculated     using the CKD EPI equation.     This calculation has not been     validated in all clinical     situations.     eGFR's persistently     <90 mL/min signify     possible Chronic Kidney Disease.  CBC     Status: Abnormal   Collection Time    06/03/12  6:38 PM      Result Value Range   WBC 11.9 (*) 4.0 - 10.5 K/uL   RBC 3.64 (*) 3.87 - 5.11 MIL/uL   Hemoglobin 11.2 (*) 12.0 - 15.0 g/dL   HCT 78.2 (*) 95.6 - 21.3 %   MCV 93.1  78.0 - 100.0 fL   MCH 30.8  26.0 - 34.0 pg   MCHC 33.0  30.0 - 36.0 g/dL   RDW 08.6 (*) 57.8 - 46.9 %   Platelets 132 (*) 150 - 400 K/uL  POCT I-STAT TROPONIN I     Status: None   Collection Time    06/03/12  7:26 PM      Result Value Range   Troponin i, poc 0.01  0.00 - 0.08 ng/mL   Comment 3            Comment: Due to the release kinetics of cTnI,     a negative result within the first hours     of the onset of symptoms does not rule out     myocardial infarction with certainty.     If myocardial infarction is still suspected,     repeat the test at appropriate intervals.  GLUCOSE, CAPILLARY     Status: Abnormal   Collection Time    06/03/12  8:19 PM      Result Value Range   Glucose-Capillary 163 (*) 70 - 99 mg/dL  D-DIMER, QUANTITATIVE     Status: Abnormal   Collection Time    06/03/12 11:55 PM      Result Value Range   D-Dimer, Quant 3.06 (*) 0.00 - 0.48 ug/mL-FEU   Comment:            AT THE INHOUSE ESTABLISHED CUTOFF     VALUE OF 0.48 ug/mL FEU,     THIS ASSAY HAS BEEN DOCUMENTED     IN THE LITERATURE TO HAVE     A SENSITIVITY AND NEGATIVE     PREDICTIVE VALUE OF AT LEAST     98 TO 99%.  THE TEST RESULT     SHOULD BE CORRELATED WITH     AN ASSESSMENT OF THE CLINICAL     PROBABILITY  OF DVT / VTE.  HEMOGLOBIN A1C     Status: Abnormal   Collection Time    06/03/12 11:55 PM      Result Value Range   Hemoglobin A1C 6.9 (*) <5.7 %   Comment: (NOTE)  According to the ADA Clinical Practice Recommendations for 2011, when     HbA1c is used as a screening test:      >=6.5%   Diagnostic of Diabetes Mellitus               (if abnormal result is confirmed)     5.7-6.4%   Increased risk of developing Diabetes Mellitus     References:Diagnosis and Classification of Diabetes Mellitus,Diabetes     Care,2011,34(Suppl 1):S62-S69 and Standards of Medical Care in             Diabetes - 2011,Diabetes Care,2011,34 (Suppl 1):S11-S61.   Mean Plasma Glucose 151 (*) <117 mg/dL  CBC     Status: Abnormal   Collection Time    06/03/12 11:55 PM      Result Value Range   WBC 10.4  4.0 - 10.5 K/uL   RBC 3.31 (*) 3.87 - 5.11 MIL/uL   Hemoglobin 10.0 (*) 12.0 - 15.0 g/dL   HCT 16.1 (*) 09.6 - 04.5 %   MCV 93.4  78.0 - 100.0 fL   MCH 30.2  26.0 - 34.0 pg   MCHC 32.4  30.0 - 36.0 g/dL   RDW 40.9 (*) 81.1 - 91.4 %   Platelets 136 (*) 150 - 400 K/uL  CREATININE, SERUM     Status: Abnormal   Collection Time    06/03/12 11:55 PM      Result Value Range   Creatinine, Ser 2.24 (*) 0.50 - 1.10 mg/dL   GFR calc non Af Amer 20 (*) >90 mL/min   GFR calc Af Amer 23 (*) >90 mL/min   Comment:            The eGFR has been calculated     using the CKD EPI equation.     This calculation has not been     validated in all clinical     situations.     eGFR's persistently     <90 mL/min signify     possible Chronic Kidney Disease.  TROPONIN I     Status: None   Collection Time    06/03/12 11:56 PM      Result Value Range   Troponin I <0.30  <0.30 ng/mL   Comment:            Due to the release kinetics of cTnI,     a negative result within the first hours     of the onset of symptoms does not rule out     myocardial  infarction with certainty.     If myocardial infarction is still suspected,     repeat the test at appropriate intervals.  GLUCOSE, CAPILLARY     Status: Abnormal   Collection Time    06/04/12 12:11 AM      Result Value Range   Glucose-Capillary 177 (*) 70 - 99 mg/dL   Comment 1 Documented in Chart     Comment 2 Notify RN    MRSA PCR SCREENING     Status: Abnormal   Collection Time    06/04/12  1:59 AM      Result Value Range   MRSA by PCR POSITIVE (*) NEGATIVE   Comment:            The GeneXpert MRSA Assay (FDA     approved for NASAL specimens     only), is one component of a     comprehensive MRSA colonization     surveillance program. It  is not     intended to diagnose MRSA     infection nor to guide or     monitor treatment for     MRSA infections.     RESULT CALLED TO, READ BACK BY AND VERIFIED WITH:     CALLED TO RN Burnett Corrente [id number] @[id number]  THANEY  COMPREHENSIVE METABOLIC PANEL     Status: Abnormal   Collection Time    06/04/12  6:08 AM      Result Value Range   Sodium 137  135 - 145 mEq/L   Potassium 3.2 (*) 3.5 - 5.1 mEq/L   Chloride 100  96 - 112 mEq/L   CO2 29  19 - 32 mEq/L   Glucose, Bld 159 (*) 70 - 99 mg/dL   BUN 13  6 - 23 mg/dL   Creatinine, Ser 5.62 (*) 0.50 - 1.10 mg/dL   Calcium 9.4  8.4 - 13.0 mg/dL   Total Protein 5.4 (*) 6.0 - 8.3 g/dL   Albumin 2.4 (*) 3.5 - 5.2 g/dL   AST 17  0 - 37 U/L   ALT <5  0 - 35 U/L   Alkaline Phosphatase 109  39 - 117 U/L   Total Bilirubin 0.3  0.3 - 1.2 mg/dL   GFR calc non Af Amer 17 (*) >90 mL/min   GFR calc Af Amer 19 (*) >90 mL/min   Comment:            The eGFR has been calculated     using the CKD EPI equation.     This calculation has not been     validated in all clinical     situations.     eGFR's persistently     <90 mL/min signify     possible Chronic Kidney Disease.  CBC WITH DIFFERENTIAL     Status: Abnormal   Collection Time    06/04/12  6:08 AM      Result Value Range   WBC 8.8  4.0 - 10.5  K/uL   Comment: WHITE COUNT CONFIRMED ON SMEAR   RBC 3.11 (*) 3.87 - 5.11 MIL/uL   Hemoglobin 9.6 (*) 12.0 - 15.0 g/dL   HCT 86.5 (*) 78.4 - 69.6 %   MCV 92.9  78.0 - 100.0 fL   MCH 30.9  26.0 - 34.0 pg   MCHC 33.2  30.0 - 36.0 g/dL   RDW 29.5 (*) 28.4 - 13.2 %   Platelets PLATELET CLUMPS NOTED ON SMEAR, UNABLE TO ESTIMATE  150 - 400 K/uL   Neutrophils Relative 69  43 - 77 %   Lymphocytes Relative 19  12 - 46 %   Monocytes Relative 10  3 - 12 %   Eosinophils Relative 1  0 - 5 %   Basophils Relative 1  0 - 1 %   Neutro Abs 6.0  1.7 - 7.7 K/uL   Lymphs Abs 1.7  0.7 - 4.0 K/uL   Monocytes Absolute 0.9  0.1 - 1.0 K/uL   Eosinophils Absolute 0.1  0.0 - 0.7 K/uL   Basophils Absolute 0.1  0.0 - 0.1 K/uL   RBC Morphology POLYCHROMASIA PRESENT     WBC Morphology MILD LEFT SHIFT (1-5% METAS, OCC MYELO, OCC BANDS)    TSH     Status: None   Collection Time    06/04/12  6:08 AM      Result Value Range   TSH 2.145  0.350 - 4.500 uIU/mL  MAGNESIUM  Status: None   Collection Time    06/04/12  6:08 AM      Result Value Range   Magnesium 2.0  1.5 - 2.5 mg/dL  LIPID PANEL     Status: Abnormal   Collection Time    06/04/12  6:08 AM      Result Value Range   Cholesterol 96  0 - 200 mg/dL   Triglycerides 161  <096 mg/dL   HDL 34 (*) >04 mg/dL   Total CHOL/HDL Ratio 2.8     VLDL 24  0 - 40 mg/dL   LDL Cholesterol 38  0 - 99 mg/dL   Comment:            Total Cholesterol/HDL:CHD Risk     Coronary Heart Disease Risk Table                         Men   Women      1/2 Average Risk   3.4   3.3      Average Risk       5.0   4.4      2 X Average Risk   9.6   7.1      3 X Average Risk  23.4   11.0                Use the calculated Patient Ratio     above and the CHD Risk Table     to determine the patient's CHD Risk.                ATP III CLASSIFICATION (LDL):      <100     mg/dL   Optimal      540-981  mg/dL   Near or Above                        Optimal      130-159  mg/dL    Borderline      191-478  mg/dL   High      >295     mg/dL   Very High  GLUCOSE, CAPILLARY     Status: Abnormal   Collection Time    06/04/12  7:18 AM      Result Value Range   Glucose-Capillary 143 (*) 70 - 99 mg/dL   Comment 1 Documented in Chart     Comment 2 Notify RN    TROPONIN I     Status: None   Collection Time    06/04/12 10:41 AM      Result Value Range   Troponin I <0.30  <0.30 ng/mL   Comment:            Due to the release kinetics of cTnI,     a negative result within the first hours     of the onset of symptoms does not rule out     myocardial infarction with certainty.     If myocardial infarction is still suspected,     repeat the test at appropriate intervals.  GLUCOSE, CAPILLARY     Status: None   Collection Time    06/04/12 11:47 AM      Result Value Range   Glucose-Capillary 98  70 - 99 mg/dL  TROPONIN I     Status: None   Collection Time    06/04/12  4:37 PM      Result Value Range   Troponin I <  0.30  <0.30 ng/mL   Comment:            Due to the release kinetics of cTnI,     a negative result within the first hours     of the onset of symptoms does not rule out     myocardial infarction with certainty.     If myocardial infarction is still suspected,     repeat the test at appropriate intervals.  GLUCOSE, CAPILLARY     Status: Abnormal   Collection Time    06/04/12  4:47 PM      Result Value Range   Glucose-Capillary 148 (*) 70 - 99 mg/dL  TROPONIN I     Status: None   Collection Time    06/04/12  8:35 PM      Result Value Range   Troponin I <0.30  <0.30 ng/mL   Comment:            Due to the release kinetics of cTnI,     a negative result within the first hours     of the onset of symptoms does not rule out     myocardial infarction with certainty.     If myocardial infarction is still suspected,     repeat the test at appropriate intervals.  GLUCOSE, CAPILLARY     Status: Abnormal   Collection Time    06/04/12  9:50 PM      Result  Value Range   Glucose-Capillary 166 (*) 70 - 99 mg/dL  CBC     Status: Abnormal   Collection Time    06/05/12  6:55 AM      Result Value Range   WBC 7.2  4.0 - 10.5 K/uL   RBC 3.00 (*) 3.87 - 5.11 MIL/uL   Hemoglobin 9.1 (*) 12.0 - 15.0 g/dL   HCT 16.1 (*) 09.6 - 04.5 %   MCV 95.0  78.0 - 100.0 fL   MCH 30.3  26.0 - 34.0 pg   MCHC 31.9  30.0 - 36.0 g/dL   RDW 40.9 (*) 81.1 - 91.4 %   Platelets 150  150 - 400 K/uL  GLUCOSE, CAPILLARY     Status: Abnormal   Collection Time    06/05/12  7:04 AM      Result Value Range   Glucose-Capillary 127 (*) 70 - 99 mg/dL    Ct Head Wo Contrast  06/03/2012  *RADIOLOGY REPORT*  Clinical Data: Unresponsive patient following dialysis.  CT HEAD WITHOUT CONTRAST  Technique:  Contiguous axial images were obtained from the base of the skull through the vertex without contrast.  Comparison: 03/09/2012.  Findings: No mass lesion, mass effect, midline shift, hydrocephalus, hemorrhage.  No acute territorial cortical ischemia/infarct. Atrophy and chronic ischemic white matter disease is present.  Intracranial atherosclerosis.  Mastoid air cells clear.  Scattered old lacunar infarcts are similar to prior.  IMPRESSION: Atrophy, chronic ischemic white matter disease and old lacunar infarcts without acute intracranial abnormality.   Original Report Authenticated By: Andreas Newport, M.D.    Ct Angio Chest Pe W/cm &/or Wo Cm  06/04/2012  *RADIOLOGY REPORT*  Clinical Data: Syncope with increased D-dimer.  Dialysis patient.  CT ANGIOGRAPHY CHEST  Technique:  Multidetector CT imaging of the chest using the standard protocol during bolus administration of intravenous contrast. Multiplanar reconstructed images including MIPs were obtained and reviewed to evaluate the vascular anatomy.  Contrast: OMNIPAQUE IOHEXOL 350 MG/ML SOLN  Comparison: Most recent chest x-ray 05/1932 femur.  Findings: Good opacification of the pulmonary arterial tree.  No evidence for pulmonary emboli.   Bilateral pleural effusions are greater on the right. Right Infrahilar or right lower lobe interlobar adenopathy, short axis 11 mm, likely reactive.  Patchy right middle and lower lobe pulmonary opacities, likely early pneumonia.  Advanced atheromatous change of the aorta without aneurysmal dilatation. Trachea midline. No endobronchial lesions. Advanced coronary artery calcification with cardiomegaly.  No thoracic compression fracture.  No worrisome osseous lesions.Upper abdominal structures show renal cystic disease bilaterally with small shrunken kidneys.  Splenic granulomata.  No hepatic lesions are seen.  IMPRESSION: Right middle and lower lobe pulmonary opacities, likely early pneumonia.  Probable reactive right infrahilar adenopathy.  No evidence for pulmonary emboli.   Original Report Authenticated By: Davonna Belling, M.D.    Mri Brain Without Contrast  06/04/2012  *RADIOLOGY REPORT*  Clinical Data: Dialysis patient with episode of loss of consciousness.  MRI HEAD WITHOUT CONTRAST  Technique:  Multiplanar, multiecho pulse sequences of the brain and surrounding structures were obtained according to standard protocol without intravenous contrast.  Comparison: 06/03/2012 CT.  12/13/2008 MR.  Findings: No acute infarct.  Prominent small vessel disease type changes.  Remote right cerebellar infarct.  Remote left basal ganglia infarcts (partially hemorrhagic).  Global atrophy without hydrocephalus.  No intracranial mass lesion detected on this unenhanced exam.  No intracranial hemorrhage.  Major intracranial vascular structures are patent.  Prominent appearance of right Meckel's cave unchanged and felt to be an incidental finding.  IMPRESSION: No acute infarct.  Please see above.   Original Report Authenticated By: Lacy Duverney, M.D.    Dg Chest Port 1 View  06/03/2012  *RADIOLOGY REPORT*  Clinical Data: Syncope after dialysis  PORTABLE CHEST - 1 VIEW  Comparison: 05/30/2012  Findings: 1900 hours.  Low volume  film. The cardiopericardial silhouette is enlarged. The lungs are clear without focal infiltrate, edema, pneumothorax or pleural effusion.  Right IJ dialysis catheter tip projects at the distal SVC level. Telemetry leads overlie the chest.  IMPRESSION: Low volume film without acute cardiopulmonary findings.   Original Report Authenticated By: Kennith Center, M.D.     @ROS @ Blood pressure 147/107, pulse 58, temperature 97.6 F (36.4 C), temperature source Oral, resp. rate 13, height 5\' 9"  (1.753 m), weight 69.1 kg (152 lb 5.4 oz), SpO2 100.00%. @PHYSEXAMBYAGE2 @  Orthopaedic Exam:Awake, alert and Ox1. In bed and on renal dialysis at the time of exam. Baseline confusion.Right tibia-fibula shows no gross motion with stress. The leg is warm, sensory is intact. Baseline equinus deformities both ankles due to non ambulatory status. No xrays for exam. No pain with palpation or stressing of the right proximal tibia fracture site. Knee immobilizer discontinued. Right heel  Decubitus remains stage 3 it is dry with out erythrema or significant drainage. Also small area of Stage 2 breakdown over the right instep. Also dry with out signs of local infection. New dressing applied. Great toe area of distal medial breakdown is healed.  Assessment/Plan: Clinically she has a healed right Tibia-fibula fracture. Heel and right foot skin breakdown due to cast, these appear stable.  Plan: Will obtain xray of the right foot and tibia-fibula. Plan to discontinue the knee immobilizer as the tibia fracture appears to be healed Pending the xray. Needs to continue followup of the right foot skin changes until healed or until status dictates a need for more aggressive Surgical treatment. Will review xrays when done and talk to her caregiver, her daughter.   NITKA,JAMES E 06/05/2012,  8:38 AM

## 2012-06-05 NOTE — Progress Notes (Signed)
Subjective:  Seen on HD- alert but not extremely verbal- this is the way she is at the [facility] center- work up in process for "episode" Objective Vital signs in last 24 hours: Filed Vitals:   06/05/12 0715 06/05/12 0732 06/05/12 0800 06/05/12 0830  BP: 157/80 113/83 200/73 147/107  Pulse: 56 53 57 58  Temp: 97.6 F (36.4 C)     TempSrc: Oral     Resp: 18 17 14 13   Height:      Weight: 69.1 kg (152 lb 5.4 oz)     SpO2: 100%      Weight change:  No intake or output data in the 24 hours ending 06/05/12 0917 Labs: Basic Metabolic Panel:  Recent Labs Lab 06/03/12 1838 06/03/12 2355 06/04/12 0608 06/05/12 0655  NA 139  --  137 139  K 2.7*  --  3.2* 3.8  CL 96  --  100 100  CO2 29  --  29 31  GLUCOSE 181*  --  159* 143*  BUN 7  --  13 17  CREATININE 1.76* 2.24* 2.59* 3.92*  CALCIUM 9.0  --  9.4 10.3  PHOS  --   --   --  3.3   Liver Function Tests:  Recent Labs Lab 05/30/12 1735 06/03/12 1838 06/04/12 0608 06/05/12 0655  AST 18 19 17   --   ALT <5 <5 <5  --   ALKPHOS 116 112 109  --   BILITOT 0.4 0.5 0.3  --   PROT 5.6* 5.8* 5.4*  --   ALBUMIN 2.5* 2.6* 2.4* 2.3*   No results found for this basename: LIPASE, AMYLASE,  in the last 168 hours No results found for this basename: AMMONIA,  in the last 168 hours CBC:  Recent Labs Lab 05/30/12 1735 06/03/12 1838 06/03/12 2355 06/04/12 0608 06/05/12 0655  WBC 10.5 11.9* 10.4 8.8 7.2  NEUTROABS 7.9*  --   --  6.0  --   HGB 10.6* 11.2* 10.0* 9.6* 9.1*  HCT 33.7* 33.9* 30.9* 28.9* 28.5*  MCV 97.7 93.1 93.4 92.9 95.0  PLT 128* 132* 136* PLATELET CLUMPS NOTED ON SMEAR, UNABLE TO ESTIMATE 150   Cardiac Enzymes:  Recent Labs Lab 06/03/12 2356 06/04/12 1041 06/04/12 1637 06/04/12 2035  TROPONINI <0.30 <0.30 <0.30 <0.30   CBG:  Recent Labs Lab 06/04/12 0718 06/04/12 1147 06/04/12 1647 06/04/12 2150 06/05/12 0704  GLUCAP 143* 98 148* 166* 127*    Iron Studies: No results found for this basename: IRON,  TIBC, TRANSFERRIN, FERRITIN,  in the last 72 hours Studies/Results: Ct Head Wo Contrast  06/03/2012  *RADIOLOGY REPORT*  Clinical Data: Unresponsive patient following dialysis.  CT HEAD WITHOUT CONTRAST  Technique:  Contiguous axial images were obtained from the base of the skull through the vertex without contrast.  Comparison: 03/09/2012.  Findings: No mass lesion, mass effect, midline shift, hydrocephalus, hemorrhage.  No acute territorial cortical ischemia/infarct. Atrophy and chronic ischemic white matter disease is present.  Intracranial atherosclerosis.  Mastoid air cells clear.  Scattered old lacunar infarcts are similar to prior.  IMPRESSION: Atrophy, chronic ischemic white matter disease and old lacunar infarcts without acute intracranial abnormality.   Original Report Authenticated By: Andreas Newport, M.D.    Ct Angio Chest Pe W/cm &/or Wo Cm  06/04/2012  *RADIOLOGY REPORT*  Clinical Data: Syncope with increased D-dimer.  Dialysis patient.  CT ANGIOGRAPHY CHEST  Technique:  Multidetector CT imaging of the chest using the standard protocol during bolus administration of intravenous contrast. Multiplanar  reconstructed images including MIPs were obtained and reviewed to evaluate the vascular anatomy.  Contrast: OMNIPAQUE IOHEXOL 350 MG/ML SOLN  Comparison: Most recent chest x-ray 05/1932 femur.  Findings: Good opacification of the pulmonary arterial tree.  No evidence for pulmonary emboli.  Bilateral pleural effusions are greater on the right. Right Infrahilar or right lower lobe interlobar adenopathy, short axis 11 mm, likely reactive.  Patchy right middle and lower lobe pulmonary opacities, likely early pneumonia.  Advanced atheromatous change of the aorta without aneurysmal dilatation. Trachea midline. No endobronchial lesions. Advanced coronary artery calcification with cardiomegaly.  No thoracic compression fracture.  No worrisome osseous lesions.Upper abdominal structures show renal cystic  disease bilaterally with small shrunken kidneys.  Splenic granulomata.  No hepatic lesions are seen.  IMPRESSION: Right middle and lower lobe pulmonary opacities, likely early pneumonia.  Probable reactive right infrahilar adenopathy.  No evidence for pulmonary emboli.   Original Report Authenticated By: Davonna Belling, M.D.    Mri Brain Without Contrast  06/04/2012  *RADIOLOGY REPORT*  Clinical Data: Dialysis patient with episode of loss of consciousness.  MRI HEAD WITHOUT CONTRAST  Technique:  Multiplanar, multiecho pulse sequences of the brain and surrounding structures were obtained according to standard protocol without intravenous contrast.  Comparison: 06/03/2012 CT.  12/13/2008 MR.  Findings: No acute infarct.  Prominent small vessel disease type changes.  Remote right cerebellar infarct.  Remote left basal ganglia infarcts (partially hemorrhagic).  Global atrophy without hydrocephalus.  No intracranial mass lesion detected on this unenhanced exam.  No intracranial hemorrhage.  Major intracranial vascular structures are patent.  Prominent appearance of right Meckel's cave unchanged and felt to be an incidental finding.  IMPRESSION: No acute infarct.  Please see above.   Original Report Authenticated By: Lacy Duverney, M.D.    Dg Chest Port 1 View  06/03/2012  *RADIOLOGY REPORT*  Clinical Data: Syncope after dialysis  PORTABLE CHEST - 1 VIEW  Comparison: 05/30/2012  Findings: 1900 hours.  Low volume film. The cardiopericardial silhouette is enlarged. The lungs are clear without focal infiltrate, edema, pneumothorax or pleural effusion.  Right IJ dialysis catheter tip projects at the distal SVC level. Telemetry leads overlie the chest.  IMPRESSION: Low volume film without acute cardiopulmonary findings.   Original Report Authenticated By: Kennith Center, M.D.    Medications: Infusions:    Scheduled Medications: . atorvastatin  10 mg Oral Daily  . calcium acetate (Phos Binder)  1,334 mg Oral TID WC  .  carbidopa-levodopa  1.5 tablet Oral TID  . carvedilol  12.5 mg Oral BID WC  . cinacalcet  30 mg Oral Q breakfast  . clonazePAM  0.5 mg Oral QHS  . darbepoetin (ARANESP) injection - DIALYSIS  60 mcg Intravenous Q Fri-HD  . doxercalciferol  1 mcg Intravenous Q M,W,F-HD  . enoxaparin (LOVENOX) injection  30 mg Subcutaneous Q24H  . FLUoxetine  40 mg Oral Daily  . insulin aspart  0-9 Units Subcutaneous TID WC  . insulin glargine  5 Units Subcutaneous QHS  . levothyroxine  25 mcg Oral QAC breakfast  . loratadine  10 mg Oral Daily  . multivitamin  1 tablet Oral Daily    have reviewed scheduled and prn medications.  Physical Exam: General: NAD, looking around Heart: RRR Lungs: mostly clear Abdomen: soft, non tender Extremities: no visible edema Dialysis Access: R AVF  And right sided PC    Assessment/ Plan: Pt is a 79 y.o. yo female who was admitted on 06/03/2012 with unresponsive episode after  dialysis on Wednesday  Assessment/Plan: 1. syncopal event post dialysis at home 4/30 -  raise EDW at the least to 69.5 or 70 at discharge; work up in progress. 2D Echo 55-60% February 2014; enzymes negative x 2, EEG pending 2. ESRD - MWF -HD today keeping even; CXR clear;  hypokalemic after dialysis yesterday tx with po KCl 3. Hypertension/volume - even if she is nonambulatory can do sitting and supine BP pre and post HD 4. Anemia - Hgb 9.1; not clear why the decline; repeat Friday; resume Epo- was on 9000 before it was held; hold Fe due to high tsat. If Hgb drops further would do FOBT- had documented + test 03/2012 (colonoscopy with snare polypectomy; multiple diverticula at sigmoid during Feb admission at [facility] 5. Metabolic bone disease - Prone to hypercalcemia - changed to 2 Ca bath recently; continue hectorol 1( lowered due to ^ C); corrected Ca 10.7- according to her daughter she was JUST started on sensipar; no GI side effects so far -  on phoslyra for binder 6. Nutrition - renal diet - would  add at least one high K food per day at discharge to keep K up so we can keep on 2 Ca bath 7. DM - SSI 8. Atrial fibrillation - stable; on coreg 12.5 bid.  9. Hypothyroidism - on synthroid TSH in range 10. R tib/fib fractures (from Nov 2013)--she was to have seen Dr. Otelia Sergeant today to have brace removed form R LE. Will ask that he see her while she's here in hospital.  He saw, thinks is healed to take off brace   Katrina Meyer A   06/05/2012,9:17 AM  LOS: 2 days

## 2012-06-05 NOTE — Discharge Summary (Signed)
Physician Discharge Summary  Katrina Meyer:[id number] DOB: 1932/07/22 DOA: 06/03/2012  PCP: Ignatius Specking., MD  Admit date: 06/03/2012 Discharge date: 06/05/2012  Time spent: 35  minutes  Recommendations for Outpatient Follow-up:  1. Follow up with cardiologist.   Discharge Diagnoses:    Syncope, probably hemodynamic related.    DM   ESRD   Atrial fibrillation   Parkinson's disease   Decubitus ulcer, heel   Discharge Condition: stable  Diet recommendation: Renal Diet   Filed Weights   06/03/12 2308 06/05/12 0715 06/05/12 1105  Weight: 68.5 kg (151 lb 0.2 oz) 69.1 kg (152 lb 5.4 oz) 68.7 kg (151 lb 7.3 oz)    History of present illness:  Katrina Meyer is a 79 y.o. female with known history of ESRD on hemodialysis was brought to the ER after patient was found to have a brief spell of loss of consciousness. As per patient's daughter and patient's caretaker who were at the bedside patient had a brief spell of loss of consciousness while she was at her home just arriving after dialysis. She may have lost consciousness for a few seconds. Once she was consciousness CPR was started by standers and patient regained consciousness immediately. Her CBG checks over there was 130. Patient was brought to the ER. Patient was found to be nonfocal. EKG was showing A. fib. CT head was showing nothing acute. Patient has been admitted for further observation. Patient has been bedbound for last few months after she had sustained a right tibia-fibula fracture for which patient has been wearing a cast. Patient also has bilateral heel ulcers. Patient otherwise denies any chest pain or shortness of breath   Hospital Course:  1-Syncope: Unclear etiology, could be secondary to hypokalemia, Hypotension, hemodynamic. Patient had  D dimer elevated. CT angio negative for PE.  ECHO pending. MRI negative for acute stroke. Her dry weight was adjusted during dialysis to avoid decrease volume. Patient tolerates  dialysis well today. She will be discharge today after ECO results. She can follow up with her cardiologist for holter if needed. EEG negative for seizure.  2-Atrial fibrillation/ Non sustain SVT: Patient asymptomatic. Replace K. Mg was normal.  ECHO pending. cardiac enzymes negative. Continue with coreg. Will defer anticoagulation to her primary cardiologist.  3-ESRD on hemodialysis: renal Consulted. Had dialysis today. Her dry weight was adjusted to avoid decrease volume. 4-Diabetes mellitus type 2: SSI, Lantus.  5-Parkinson's disease ; Continue with Carbidopa.  6-Bilateral heel decubitus ulcers - wound team consulted  7-Right Tibial fracture: I informed Dr Otelia Sergeant office of patient admission. Cast removed.  Severe Malnutrition in setting chronic illness. Supplements.    Procedures:  ECHO pending  Consultations:  Renal  Discharge Exam: Filed Vitals:   06/05/12 1000 06/05/12 1030 06/05/12 1105 06/05/12 1344  BP: 136/84 115/77 154/97 147/91  Pulse: 83 63 69 67  Temp:   98.2 F (36.8 C) 98.2 F (36.8 C)  TempSrc:   Oral Oral  Resp: 16 8 17 18   Height:      Weight:   68.7 kg (151 lb 7.3 oz)   SpO2:   99% 95%    General: No distress.  Cardiovascular: S 1, S 2 RRR Respiratory: CTA  Discharge Instructions  Discharge Orders   Future Appointments Provider Department Dept Phone   06/23/2012 2:45 PM Kathlen Brunswick, MD [facility] [phone]   10/27/2012 1:00 PM [facility] [phone]   10/27/2012 1:30 PM [facility] [phone]   10/27/2012 2:00 PM Pryor Ochoa, MD [facility] [phone]   Future Orders Complete By Expires     Diet - low sodium heart healthy  As directed     Increase activity slowly  As directed     Non weight bearing  As directed     Scheduling Instructions:      Non weight bearing both legs.        Medication  List    TAKE these medications       atorvastatin 10 MG tablet  Commonly known as:  LIPITOR  Take 10 mg by mouth daily.     calcium acetate (Phos Binder) 667 MG/5ML Soln  Commonly known as:  PHOSLYRA  Take 667-1,334 mg by mouth 3 (three) times daily with meals. Takes with snacks and with meals     carbidopa-levodopa 25-100 MG per tablet  Commonly known as:  SINEMET IR  Take 1.5 tablets by mouth 3 (three) times daily.     carvedilol 12.5 MG tablet  Commonly known as:  COREG  Take 12.5 mg by mouth 2 (two) times daily with a meal.     cinacalcet 30 MG tablet  Commonly known as:  SENSIPAR  Take 30 mg by mouth daily.     donepezil 10 MG tablet  Commonly known as:  ARICEPT  Take 10 mg by mouth daily.     fexofenadine 180 MG tablet  Commonly known as:  ALLEGRA  Take 180 mg by mouth daily.     FLUoxetine 40 MG capsule  Commonly known as:  PROZAC  Take 40 mg by mouth daily.     insulin glargine 100 UNIT/ML injection  Commonly known as:  LANTUS  Inject 5 Units into the skin at bedtime.     insulin NPH-regular (70-30) 100 UNIT/ML injection  Commonly known as:  NOVOLIN 70/30  Inject 1-5 Units into the skin 3 (three) times daily. Per sliding scale     levofloxacin 250 MG tablet  Commonly known as:  LEVAQUIN  Take 1 tablet (250 mg total) by mouth daily.     levothyroxine 25 MCG tablet  Commonly known as:  SYNTHROID, LEVOTHROID  Take 25 mcg by mouth every morning.     multivitamin Tabs tablet  Take 1 tablet by mouth daily.     traMADol 50 MG tablet  Commonly known as:  ULTRAM  Take 50 mg by mouth every 6 (six) hours as needed for pain.       Allergies  Allergen Reactions  . Hydralazine Rash       Follow-up Information   Follow up with NITKA,JAMES E, MD. Schedule an appointment as soon as possible for a visit in 3 weeks.   Contact information:   [address] [place] [address] [phone]       Follow up with NICHOLS,HAROLD A In 1 week.        The results of significant diagnostics from this hospitalization (including imaging, microbiology, ancillary and laboratory) are listed below for reference.    Significant Diagnostic Studies: Ct Head Wo Contrast  06/03/2012  *RADIOLOGY REPORT*  Clinical Data: Unresponsive patient following dialysis.  CT HEAD WITHOUT CONTRAST  Technique:  Contiguous axial images were obtained from the base of the skull through the vertex without contrast.  Comparison: 03/09/2012.  Findings: No mass lesion, mass effect, midline shift, hydrocephalus, hemorrhage.  No acute territorial cortical ischemia/infarct. Atrophy and  chronic ischemic white matter disease is present.  Intracranial atherosclerosis.  Mastoid air cells clear.  Scattered old lacunar infarcts are similar to prior.  IMPRESSION: Atrophy, chronic ischemic white matter disease and old lacunar infarcts without acute intracranial abnormality.   Original Report Authenticated By: Andreas Newport, M.D.    Ct Angio Chest Pe W/cm &/or Wo Cm  06/04/2012  *RADIOLOGY REPORT*  Clinical Data: Syncope with increased D-dimer.  Dialysis patient.  CT ANGIOGRAPHY CHEST  Technique:  Multidetector CT imaging of the chest using the standard protocol during bolus administration of intravenous contrast. Multiplanar reconstructed images including MIPs were obtained and reviewed to evaluate the vascular anatomy.  Contrast: OMNIPAQUE IOHEXOL 350 MG/ML SOLN  Comparison: Most recent chest x-ray 05/1932 femur.  Findings: Good opacification of the pulmonary arterial tree.  No evidence for pulmonary emboli.  Bilateral pleural effusions are greater on the right. Right Infrahilar or right lower lobe interlobar adenopathy, short axis 11 mm, likely reactive.  Patchy right middle and lower lobe pulmonary opacities, likely early pneumonia.  Advanced atheromatous change of the aorta without aneurysmal dilatation. Trachea midline. No endobronchial lesions. Advanced coronary artery  calcification with cardiomegaly.  No thoracic compression fracture.  No worrisome osseous lesions.Upper abdominal structures show renal cystic disease bilaterally with small shrunken kidneys.  Splenic granulomata.  No hepatic lesions are seen.  IMPRESSION: Right middle and lower lobe pulmonary opacities, likely early pneumonia.  Probable reactive right infrahilar adenopathy.  No evidence for pulmonary emboli.   Original Report Authenticated By: Davonna Belling, M.D.    Mri Brain Without Contrast  06/04/2012  *RADIOLOGY REPORT*  Clinical Data: Dialysis patient with episode of loss of consciousness.  MRI HEAD WITHOUT CONTRAST  Technique:  Multiplanar, multiecho pulse sequences of the brain and surrounding structures were obtained according to standard protocol without intravenous contrast.  Comparison: 06/03/2012 CT.  12/13/2008 MR.  Findings: No acute infarct.  Prominent small vessel disease type changes.  Remote right cerebellar infarct.  Remote left basal ganglia infarcts (partially hemorrhagic).  Global atrophy without hydrocephalus.  No intracranial mass lesion detected on this unenhanced exam.  No intracranial hemorrhage.  Major intracranial vascular structures are patent.  Prominent appearance of right Meckel's cave unchanged and felt to be an incidental finding.  IMPRESSION: No acute infarct.  Please see above.   Original Report Authenticated By: Lacy Duverney, M.D.    Dg Chest Port 1 View  06/03/2012  *RADIOLOGY REPORT*  Clinical Data: Syncope after dialysis  PORTABLE CHEST - 1 VIEW  Comparison: 05/30/2012  Findings: 1900 hours.  Low volume film. The cardiopericardial silhouette is enlarged. The lungs are clear without focal infiltrate, edema, pneumothorax or pleural effusion.  Right IJ dialysis catheter tip projects at the distal SVC level. Telemetry leads overlie the chest.  IMPRESSION: Low volume film without acute cardiopulmonary findings.   Original Report Authenticated By: Kennith Center, M.D.    Dg  Tibia/fibula Right Port  06/05/2012  *RADIOLOGY REPORT*  Clinical Data: 79 year old female with right proximal tibia fracture status post casting for 5 months.  PORTABLE RIGHT TIBIA AND FIBULA - 2 VIEW  Comparison: 01/05/2012 and earlier.  Findings: Portable AP and cross-table lateral views of the right tib-fib.  Interval periosteal reaction and callus about the oblique proximal right tibia fracture.  Fracture lucency is still visible in areas.  I do suspect some bridging bone along the lateral tibial cortex (arrow on the AP view).  At the same time there is healing about the proximal right fibular shaft fracture.  There is also evidence of a nondisplaced fracture with sclerosis at the lateral malleolus.  Also sclerosis of the tibial plafond is increased.  There is advanced calcified atherosclerosis throughout the visualized extremity.  Previous stenting of the right popliteal artery.  IMPRESSION: 1.  Interval healing reaction at the proximal tibia and fibula fracture sites.  At the tibia there is still fracture lucency visible, although there is some evidence of bridging bone along the lateral cortical aspect of the injury. 2.  Suspect interval fractures of the lateral malleolus and tibial plafond.  Perhaps these are insufficiency fractures.   Original Report Authenticated By: Erskine Speed, M.D.    Dg Abd Acute W/chest  05/30/2012  *RADIOLOGY REPORT*  Clinical Data: Abdominal pain, diarrhea  ACUTE ABDOMEN SERIES (ABDOMEN 2 VIEW & CHEST 1 VIEW)  Comparison: 03/24/2012  Findings: Dual lumen right hemodialysis catheter terminates over the high right atrium / cavoatrial junction.  Moderate enlargement cardiomediastinal silhouette is stable. Aorta is ectatic and unfolded.  Lung volumes are low with crowding of the bronchovascular markings.  No pleural effusion.  No free air beneath the diaphragms.  Abdominal radiographs are suboptimal due to patient inability to be properly positioned.  No bowel dilatation identified.   Moderate to severe aortic atherosclerotic calcification.  No abnormal calcific opacity.  No new osseous abnormality.  IMPRESSION: Nonobstructive bowel gas pattern.  Cardiomegaly without focal acute cardiopulmonary process allowing for hypoaeration and technique.   Original Report Authenticated By: Christiana Pellant, M.D.     Microbiology: Recent Results (from the past 240 hour(s))  MRSA PCR SCREENING     Status: Abnormal   Collection Time    06/04/12  1:59 AM      Result Value Range Status   MRSA by PCR POSITIVE (*) NEGATIVE Final   Comment:            The GeneXpert MRSA Assay (FDA     approved for NASAL specimens     only), is one component of a     comprehensive MRSA colonization     surveillance program. It is not     intended to diagnose MRSA     infection nor to guide or     monitor treatment for     MRSA infections.     RESULT CALLED TO, READ BACK BY AND VERIFIED WITH:     CALLED TO RN Burnett Corrente [id number] @0620  THANEY     Labs: Basic Metabolic Panel:  Recent Labs Lab 05/30/12 1735 06/03/12 1838 06/03/12 2355 06/04/12 0608 06/05/12 0655  NA 137 139  --  137 139  K 3.1* 2.7*  --  3.2* 3.8  CL 95* 96  --  100 100  CO2 31 29  --  29 31  GLUCOSE 177* 181*  --  159* 143*  BUN 17 7  --  13 17  CREATININE 3.38* 1.76* 2.24* 2.59* 3.92*  CALCIUM 10.6* 9.0  --  9.4 10.3  MG  --   --   --  2.0  --   PHOS  --   --   --   --  3.3   Liver Function Tests:  Recent Labs Lab 05/30/12 1735 06/03/12 1838 06/04/12 0608 06/05/12 0655  AST 18 19 17   --   ALT <5 <5 <5  --   ALKPHOS 116 112 109  --   BILITOT 0.4 0.5 0.3  --   PROT 5.6* 5.8* 5.4*  --   ALBUMIN 2.5* 2.6* 2.4* 2.3*  No results found for this basename: LIPASE, AMYLASE,  in the last 168 hours No results found for this basename: AMMONIA,  in the last 168 hours CBC:  Recent Labs Lab 05/30/12 1735 06/03/12 1838 06/03/12 2355 06/04/12 0608 06/05/12 0655  WBC 10.5 11.9* 10.4 8.8 7.2  NEUTROABS 7.9*  --   --   6.0  --   HGB 10.6* 11.2* 10.0* 9.6* 9.1*  HCT 33.7* 33.9* 30.9* 28.9* 28.5*  MCV 97.7 93.1 93.4 92.9 95.0  PLT 128* 132* 136* PLATELET CLUMPS NOTED ON SMEAR, UNABLE TO ESTIMATE 150   Cardiac Enzymes:  Recent Labs Lab 06/03/12 2356 06/04/12 1041 06/04/12 1637 06/04/12 2035  TROPONINI <0.30 <0.30 <0.30 <0.30   BNP: BNP (last 3 results) No results found for this basename: PROBNP,  in the last 8760 hours CBG:  Recent Labs Lab 06/04/12 1147 06/04/12 1647 06/04/12 2150 06/05/12 0704 06/05/12 1156  GLUCAP 98 148* 166* 127* 88       Signed:  Kania Regnier  [facility] 06/05/2012, 3:04 PM

## 2012-06-09 ENCOUNTER — Telehealth: Payer: Self-pay | Admitting: *Deleted

## 2012-06-09 NOTE — Telephone Encounter (Signed)
Gerome Apley renal PA called to say that the dialysis center is saying that they have tried to stick Katrina Meyer's AVG x 2 and it has infiltrated both times. She is dialyzing thru a catheter. I asked Dr Early what I should do and he said to have her see Dr Darrick Penna so he could see what was going on. Appt made by [facility] for Thursday 06/11/12

## 2012-06-10 ENCOUNTER — Encounter: Payer: Self-pay | Admitting: Vascular Surgery

## 2012-06-11 ENCOUNTER — Other Ambulatory Visit: Payer: Self-pay | Admitting: *Deleted

## 2012-06-11 ENCOUNTER — Encounter: Payer: Self-pay | Admitting: Vascular Surgery

## 2012-06-11 ENCOUNTER — Ambulatory Visit (INDEPENDENT_AMBULATORY_CARE_PROVIDER_SITE_OTHER): Payer: Medicare Other | Admitting: Vascular Surgery

## 2012-06-11 VITALS — BP 122/51 | HR 56 | Ht 69.0 in | Wt 151.0 lb

## 2012-06-11 DIAGNOSIS — T82898A Other specified complication of vascular prosthetic devices, implants and grafts, initial encounter: Secondary | ICD-10-CM

## 2012-06-11 DIAGNOSIS — N186 End stage renal disease: Secondary | ICD-10-CM

## 2012-06-11 NOTE — Progress Notes (Signed)
Patient is a 79 year old female who returns for followup today. She had a right upper arm graft placed in March 25. Apparently she has had problems with 2 infiltrations since the graft was placed. She currently is dialyzing via a right-sided catheter. She still has ecchymosis and swelling over her right upper arm. She denies numbness or tingling in the right hand.  Physical exam:  Filed Vitals:   06/11/12 0859  BP: 122/51  Pulse: 56  Height: 5\' 9"  (1.753 m)  Weight: 151 lb (68.493 kg)  SpO2: 100%   Right upper extremity: Circumferential ecchymosis right biceps region, palpable thrill audible bruit and graft, incisions well-healed  Assessment: Healing right upper arm graft recent infiltration Plan: We will avoid cannulating the fistula for one month. The patient will see me again in one month to see how the arm has healed. She will have a duplex ultrasound of the graft at that time. We will consider at that point whether or not to start cannulating the graft again.  Fabienne Bruns, MD [facility] Office: [phone] Pager: [phone]

## 2012-06-23 ENCOUNTER — Ambulatory Visit: Payer: Medicare Other | Admitting: Cardiology

## 2012-06-23 ENCOUNTER — Encounter: Payer: Self-pay | Admitting: Cardiology

## 2012-06-23 DIAGNOSIS — I10 Essential (primary) hypertension: Secondary | ICD-10-CM | POA: Insufficient documentation

## 2012-06-23 DIAGNOSIS — E119 Type 2 diabetes mellitus without complications: Secondary | ICD-10-CM | POA: Insufficient documentation

## 2012-06-23 DIAGNOSIS — E785 Hyperlipidemia, unspecified: Secondary | ICD-10-CM | POA: Insufficient documentation

## 2012-06-23 DIAGNOSIS — F0390 Unspecified dementia without behavioral disturbance: Secondary | ICD-10-CM | POA: Insufficient documentation

## 2012-06-23 DIAGNOSIS — Z992 Dependence on renal dialysis: Secondary | ICD-10-CM | POA: Insufficient documentation

## 2012-06-23 DIAGNOSIS — N186 End stage renal disease: Secondary | ICD-10-CM

## 2012-07-15 ENCOUNTER — Encounter: Payer: Self-pay | Admitting: Vascular Surgery

## 2012-07-16 ENCOUNTER — Ambulatory Visit: Payer: Medicare Other | Admitting: Vascular Surgery

## 2012-07-22 ENCOUNTER — Encounter: Payer: Self-pay | Admitting: Vascular Surgery

## 2012-07-23 ENCOUNTER — Encounter: Payer: Self-pay | Admitting: Vascular Surgery

## 2012-07-23 ENCOUNTER — Ambulatory Visit (INDEPENDENT_AMBULATORY_CARE_PROVIDER_SITE_OTHER): Payer: Medicare Other | Admitting: Vascular Surgery

## 2012-07-23 ENCOUNTER — Encounter (INDEPENDENT_AMBULATORY_CARE_PROVIDER_SITE_OTHER): Payer: Medicare Other | Admitting: *Deleted

## 2012-07-23 VITALS — BP 130/62 | HR 98 | Resp 16 | Ht 69.0 in | Wt 153.0 lb

## 2012-07-23 DIAGNOSIS — N186 End stage renal disease: Secondary | ICD-10-CM

## 2012-07-23 DIAGNOSIS — T82898A Other specified complication of vascular prosthetic devices, implants and grafts, initial encounter: Secondary | ICD-10-CM

## 2012-07-23 NOTE — Progress Notes (Signed)
Patient is a 79 year old female who presents for followup today. She recently had infiltration in her right upper arm graft. We have been resting the graft. She is currently dialyzing with a catheter.  She states that the pain in her upper arm has resolved.  Review of systems: She denies shortness of breath. She denies chest pain.  Physical exam:  Filed Vitals:   07/23/12 1106  BP: 130/62  Pulse: 98  Resp: 16  Height: 5\' 9"  (1.753 m)  Weight: 153 lb (69.4 kg)  SpO2: 100%   Right upper extremity: No significant remaining ecchymosis palpable thrill in graft the graft is easily palpable in the upper arm Chest: Diatek catheter clean with no evidence of infection  Data: Duplex of the AV graft was performed today which showed no significant narrowing I reviewed and interpreted this study  Assessment: Doing well with resolved infiltration right upper arm graft Plan: Okay to cannula graft followup as needed  Fabienne Bruns, MD [facility] Office: [phone] Pager: [phone]

## 2012-10-26 ENCOUNTER — Encounter: Payer: Self-pay | Admitting: Vascular Surgery

## 2012-10-27 ENCOUNTER — Ambulatory Visit: Payer: Medicare Other | Admitting: Vascular Surgery

## 2012-10-27 ENCOUNTER — Encounter: Payer: Self-pay | Admitting: *Deleted

## 2012-11-23 ENCOUNTER — Encounter: Payer: Self-pay | Admitting: Vascular Surgery

## 2012-11-24 ENCOUNTER — Ambulatory Visit (HOSPITAL_COMMUNITY)
Admission: RE | Admit: 2012-11-24 | Discharge: 2012-11-24 | Disposition: A | Discharge status: Home / Self Care | Payer: Medicare Other | Source: Ambulatory Visit | Attending: Vascular Surgery | Admitting: Vascular Surgery

## 2012-11-24 ENCOUNTER — Encounter: Payer: Self-pay | Admitting: Vascular Surgery

## 2012-11-24 ENCOUNTER — Ambulatory Visit (INDEPENDENT_AMBULATORY_CARE_PROVIDER_SITE_OTHER)
Admission: RE | Admit: 2012-11-24 | Discharge: 2012-11-24 | Disposition: A | Discharge status: Home / Self Care | Payer: Medicare Other | Source: Ambulatory Visit | Attending: Neurosurgery | Admitting: Neurosurgery

## 2012-11-24 ENCOUNTER — Ambulatory Visit (INDEPENDENT_AMBULATORY_CARE_PROVIDER_SITE_OTHER): Payer: Medicare Other | Admitting: Vascular Surgery

## 2012-11-24 VITALS — BP 187/84 | HR 63 | Resp 16 | Ht 67.0 in | Wt 143.3 lb

## 2012-11-24 DIAGNOSIS — I739 Peripheral vascular disease, unspecified: Secondary | ICD-10-CM

## 2012-11-24 DIAGNOSIS — Z48812 Encounter for surgical aftercare following surgery on the circulatory system: Secondary | ICD-10-CM

## 2012-11-24 NOTE — Progress Notes (Signed)
Subjective:     Patient ID: Katrina Meyer, female   DOB: 11/09/1932, 79 y.o.   MRN: [id number]  HPI this 79 year old female with dementia was referred today for lower extremity ischemic ulcers by Dr. Sherril Croon. This patient has chronic renal failure and has dialysis Monday Wednesday and Friday we have seen her in the past for AV graft problems. She currently has a functioning AV graft in her right upper extremity. She also had a popliteal stent placed in 2012 and ulcerations in her right foot healed at that time. She has been known to have severe lower extremity occlusive disease with ABIs in the 0.2-0.3 range about 1 year ago. She had a fracture of her right leg when she fell 11 months ago has been nonambulatory since that time and has chronic ulcers the right and left heel and the dorsum of the right foot. She is accompanied by her caretaker. She has a history of stroke as well as significant dementia.  Past Medical History  Diagnosis Date  . Hyperlipidemia     takes Lipitor daily  . DVT (deep venous thrombosis)   . Stroke   . Parkinson's disease     takes Sinemet daily  . Tibia/fibula fracture 01/04/2012    Treated with long leg casting. Cast change in hospital 02/27/12   . Peripheral vascular disease 02/27/2012    Skin ulcer of right great toe  . Atrial fibrillation 03/09/2012    Seen on older EKG as well but family not aware.   . Macrocytic anemia 03/09/2012  . Lower GI bleed 03/10/2012    hematochezia secondary to ulcerated polyps; diverticulosis; chronic diarrhea  . End stage renal disease on dialysis     [facility]- MWF  . Hypertension     takes Coreg daily  . Dementia     takes Aricept daily  . Diabetes mellitus, type II     takes Lantus and Novolog  . Seasonal allergies     takes Allegra daily  . Depression     takes Prozac daily  . Hypothyroidism     takes Synthroid daily    History  Substance Use Topics  . Smoking status: Passive Smoke Exposure - Never Smoker    Types:  Cigarettes  . Smokeless tobacco: Never Used  . Alcohol Use: No    Family History  Problem Relation Age of Onset  . Stroke Mother   . Diabetes Mother   . Cancer Mother   . Hypertension Mother   . Coronary artery disease Father   . Diabetes Sister     Allergies  Allergen Reactions  . Hydralazine Rash    Current outpatient prescriptions:atorvastatin (LIPITOR) 10 MG tablet, Take 10 mg by mouth daily. , Disp: , Rfl: ;  calcium acetate, Phos Binder, (PHOSLYRA) 667 MG/5ML SOLN, Take 667-1,334 mg by mouth 3 (three) times daily with meals. Takes with snacks and with meals, Disp: , Rfl: ;  carbidopa-levodopa (SINEMET IR) 25-100 MG per tablet, Take 1.5 tablets by mouth 3 (three) times daily., Disp: , Rfl:  carvedilol (COREG) 12.5 MG tablet, Take 12.5 mg by mouth 2 (two) times daily with a meal., Disp: , Rfl: ;  cinacalcet (SENSIPAR) 30 MG tablet, Take 30 mg by mouth daily., Disp: , Rfl: ;  donepezil (ARICEPT) 10 MG tablet, Take 10 mg by mouth daily., Disp: , Rfl: ;  fexofenadine (ALLEGRA) 180 MG tablet, Take 180 mg by mouth daily., Disp: , Rfl: ;  FLUoxetine (PROZAC) 40 MG capsule,  Take 40 mg by mouth daily., Disp: , Rfl:  insulin glargine (LANTUS) 100 UNIT/ML injection, Inject 5 Units into the skin at bedtime. , Disp: , Rfl: ;  insulin NPH-regular (NOVOLIN 70/30) (70-30) 100 UNIT/ML injection, Inject 1-5 Units into the skin 3 (three) times daily. Per sliding scale, Disp: , Rfl: ;  levofloxacin (LEVAQUIN) 250 MG tablet, Take 1 tablet (250 mg total) by mouth daily., Disp: 5 tablet, Rfl: 0 levothyroxine (SYNTHROID, LEVOTHROID) 25 MCG tablet, Take 25 mcg by mouth every morning., Disp: , Rfl: ;  Loratadine-Pseudoephedrine (CLARITIN-D 24 HOUR PO), Take by mouth daily., Disp: , Rfl: ;  multivitamin (RENA-VIT) TABS tablet, Take 1 tablet by mouth daily., Disp: , Rfl: ;  traMADol (ULTRAM) 50 MG tablet, Take 50 mg by mouth every 6 (six) hours as needed for pain., Disp: , Rfl:   BP 187/84  Pulse 63   Resp 16  Ht 5\' 7"  (1.702 m)  Wt 143 lb 4.8 oz (65 kg)  BMI 22.44 kg/m2  Body mass index is 22.44 kg/(m^2).          Review of Systems unable to obtain patient does not answer questions appropriately     Objective:   Physical Exam BP 187/84  Pulse 63  Resp 16  Ht 5\' 7"  (1.702 m)  Wt 143 lb 4.8 oz (65 kg)  BMI 22.44 kg/m2  Gen. chronically ill-appearing patient who is brought into the room a wheelchair. Is not answer questions appropriately. Has known dementia. Lungs no rhonchi or wheezing Lower extremities with absent right femoral popliteal and distal pulses. Left leg has 2+ femoral absent distal pulses. There is an chronic ischemic ulcer in the right heel measuring 2 cm in diameter and a chronic ulcer on the dorsum of the right foot about 3 cm in diameter which according to the caretaker has been present for 11 months basically unchanged Left foot has ischemic ulcer approximately 2 cm in diameter on the left heel.  Attempted duplex scan was performed today and there is very poor flow throughout the right lower extremity. ABI on the right is 0.20 and unable. Flow on the left.      Assessment:     Patient with multiple comorbidities including dementia, previous stroke, previous leg fracture, nonambulatory, now with chronic ischemic ulcers bilaterally with multilevel vascular occlusive disease including inflow and outflow disease and nonhealing ulcers bilaterally    Plan:     I do not think this patient is an operative candidate and did not feel as if stent placement will be of any benefit to her. Would not recommend further vascular evaluation but I do think she will eventually require a right AKA. Discussed this at length with the patient's caretaker and we will see her on a when necessary basis

## 2012-12-10 ENCOUNTER — Ambulatory Visit (INDEPENDENT_AMBULATORY_CARE_PROVIDER_SITE_OTHER): Payer: Medicare Other | Admitting: Cardiology

## 2012-12-10 ENCOUNTER — Encounter: Payer: Self-pay | Admitting: Cardiology

## 2012-12-10 VITALS — BP 134/61 | HR 65 | Ht 67.0 in | Wt 150.0 lb

## 2012-12-10 DIAGNOSIS — I4891 Unspecified atrial fibrillation: Secondary | ICD-10-CM

## 2012-12-10 DIAGNOSIS — I10 Essential (primary) hypertension: Secondary | ICD-10-CM

## 2012-12-10 NOTE — Patient Instructions (Addendum)
Your physician recommends that you schedule a follow-up appointment in: 6 MONTHS 

## 2012-12-10 NOTE — Progress Notes (Signed)
Clinical Summary Katrina Meyer is a 79 y.o.female seen today for follow up for the following medical problems.    1. Afib - new diagosis 03/2012 in hospital, admitted with GI bleed - not on anticoag due to history of GI bleed, and unsteady gait with parkinsons disease per Dr Daleen Squibb and Dietrich Pates. GI bleed 03/2012, hypontensive with rectal bleeding, afib in setting of anemia and hypokalemia.    - denies any palpitatoins. Denies any lightheadedness or dizziness - compliant with meds: coreg bid   2. HTN - hydralazine stopped due to rash - checks bp 3times a week at dialysis - Often her blood pressure gets low if takes coreg prior to dilaysis. On average 120s to 130 systolics.    3. PAD - followed by vascular for chronic ischemic ulcers of the lower extremity - recent visit 11/24/12, thought to be poor operative candidate due to comorbidites.   Past Medical History  Diagnosis Date  . Hyperlipidemia     takes Lipitor daily  . DVT (deep venous thrombosis)   . Stroke   . Parkinson's disease     takes Sinemet daily  . Tibia/fibula fracture 01/04/2012    Treated with long leg casting. Cast change in hospital 02/27/12   . Peripheral vascular disease 02/27/2012    Skin ulcer of right great toe  . Atrial fibrillation 03/09/2012    Seen on older EKG as well but family not aware.   . Macrocytic anemia 03/09/2012  . Lower GI bleed 03/10/2012    hematochezia secondary to ulcerated polyps; diverticulosis; chronic diarrhea  . End stage renal disease on dialysis     [facility]- MWF  . Hypertension     takes Coreg daily  . Dementia     takes Aricept daily  . Diabetes mellitus, type II     takes Lantus and Novolog  . Seasonal allergies     takes Allegra daily  . Depression     takes Prozac daily  . Hypothyroidism     takes Synthroid daily     Allergies  Allergen Reactions  . Hydralazine Rash     Current Outpatient Prescriptions  Medication Sig Dispense Refill  . atorvastatin  (LIPITOR) 10 MG tablet Take 10 mg by mouth daily.       . calcium acetate, Phos Binder, (PHOSLYRA) 667 MG/5ML SOLN Take 667-1,334 mg by mouth 3 (three) times daily with meals. Takes with snacks and with meals      . carbidopa-levodopa (SINEMET IR) 25-100 MG per tablet Take 1.5 tablets by mouth 3 (three) times daily.      . carvedilol (COREG) 12.5 MG tablet Take 12.5 mg by mouth 2 (two) times daily with a meal.      . cinacalcet (SENSIPAR) 30 MG tablet Take 30 mg by mouth daily.      Marland Kitchen donepezil (ARICEPT) 10 MG tablet Take 10 mg by mouth daily.      . fexofenadine (ALLEGRA) 180 MG tablet Take 180 mg by mouth daily.      Marland Kitchen FLUoxetine (PROZAC) 40 MG capsule Take 40 mg by mouth daily.      . insulin glargine (LANTUS) 100 UNIT/ML injection Inject 5 Units into the skin at bedtime.       . insulin NPH-regular (NOVOLIN 70/30) (70-30) 100 UNIT/ML injection Inject 1-5 Units into the skin 3 (three) times daily. Per sliding scale      . levofloxacin (LEVAQUIN) 250 MG tablet Take 1 tablet (250 mg  total) by mouth daily.  5 tablet  0  . levothyroxine (SYNTHROID, LEVOTHROID) 25 MCG tablet Take 25 mcg by mouth every morning.      . Loratadine-Pseudoephedrine (CLARITIN-D 24 HOUR PO) Take by mouth daily.      . multivitamin (RENA-VIT) TABS tablet Take 1 tablet by mouth daily.      . traMADol (ULTRAM) 50 MG tablet Take 50 mg by mouth every 6 (six) hours as needed for pain.       No current facility-administered medications for this visit.     Past Surgical History  Procedure Laterality Date  . Dg av dialysis graft declot or    . Av fistula repair  Aug. 2013    Left arm  . Cholecystectomy    . Colon surgery      Aprrox 2006 at [facility] after she had a colonoscopy  . Colonoscopy  03/10/2012    Procedure: COLONOSCOPY;  Surgeon: Malissa Hippo, MD;  [facility];  Service: Endoscopy;  Laterality: N/A;  . Esophagogastroduodenoscopy  03/10/2012    Procedure: ESOPHAGOGASTRODUODENOSCOPY (EGD);   Surgeon: Malissa Hippo, MD;  Location: [facility];  Service: Endoscopy;  Laterality: N/A;  EGD if TCS normal.  . Ligation of arteriovenous  fistula Left 03/24/2012    Procedure: LIGATION OF ARTERIOVENOUS  FISTULA;  Surgeon: Fransisco Hertz, MD;  Location: [facility] OR;  Service: Vascular;  Laterality: Left;  . Av fistula placement Right 04/28/2012    Procedure: INSERTION OF ARTERIOVENOUS (AV) GORE-TEX GRAFT ARM;  Surgeon: Sherren Kerns, MD;  Location: [facility] OR;  Service: Vascular;  Laterality: Right;     Allergies  Allergen Reactions  . Hydralazine Rash      Family History  Problem Relation Age of Onset  . Stroke Mother   . Diabetes Mother   . Cancer Mother   . Hypertension Mother   . Coronary artery disease Father   . Diabetes Sister      Social History Ms. Recupero reports that she has been passively smoking Cigarettes.  She has been smoking about 0.00 packs per day. She has never used smokeless tobacco. Ms. Slates reports that she does not drink alcohol.   Review of Systems CONSTITUTIONAL: No weight loss, fever, chills, weakness or fatigue.  HEENT: Eyes: No visual loss, blurred vision, double vision or yellow sclerae.No hearing loss, sneezing, congestion, runny nose or sore throat.  SKIN: No rash or itching.  CARDIOVASCULAR: per HPI RESPIRATORY: per HPI  GASTROINTESTINAL: No anorexia, nausea, vomiting or diarrhea. No abdominal pain or blood.  GENITOURINARY: No burning on urination, no polyuria NEUROLOGICAL: No headache, dizziness, syncope, paralysis, ataxia, numbness or tingling in the extremities. No change in bowel or bladder control.  MUSCULOSKELETAL: chronic leg and hand pain LYMPHATICS: No enlarged nodes. No history of splenectomy.  PSYCHIATRIC: No history of depression or anxiety.  ENDOCRINOLOGIC: No reports of sweating, cold or heat intolerance. No polyuria or polydipsia.  Marland Kitchen   Physical Examination p 65 bp 134/61 Wt 150 lbs BMI 23 Gen: resting comfortably, no acute  distress HEENT: no scleral icterus, pupils equal round and reactive, no palptable cervical adenopathy,  CV: RRR, no m/r/g, no JVD, no carotid bruits Resp: Clear to auscultation bilaterally GI: abdomen is soft, non-tender, non-distended, normal bowel sounds, no hepatosplenomegaly  Skin: warm Psych: appropriate affect   Diagnostic Studies 06/2012 Echo: LVEF 55-60%, severe LVH, no WMAs, mild MR, PASP 49  11/2012 ABI: no flow detected on left, right non-compressible monophasic.  Assessment and Plan  1. Afib - no current symptoms - not on anticoagulation previously by her prior cardiologists due to recent severe GI bleed, will not start at this time. - continue coreg for rate control  2. PAD - patient will continue to follow up with vascular  3. HTN - continue current medications, she is goal per the reported blood pressures at dialysis.   F/u 6 months      Antoine Poche, M.D., F.A.C.C.

## 2013-01-27 ENCOUNTER — Encounter (HOSPITAL_COMMUNITY): Payer: Self-pay | Admitting: Emergency Medicine

## 2013-01-27 ENCOUNTER — Emergency Department (HOSPITAL_COMMUNITY)
Admission: EM | Admit: 2013-01-27 | Discharge: 2013-01-27 | Disposition: A | Discharge status: Other Acute Inpatient Hospital | Payer: Medicare Other | Attending: Emergency Medicine | Admitting: Emergency Medicine

## 2013-01-27 DIAGNOSIS — Z86718 Personal history of other venous thrombosis and embolism: Secondary | ICD-10-CM | POA: Insufficient documentation

## 2013-01-27 DIAGNOSIS — Z992 Dependence on renal dialysis: Secondary | ICD-10-CM | POA: Insufficient documentation

## 2013-01-27 DIAGNOSIS — G20 Parkinson's disease: Secondary | ICD-10-CM | POA: Insufficient documentation

## 2013-01-27 DIAGNOSIS — G20A1 Parkinson's disease without dyskinesia, without mention of fluctuations: Secondary | ICD-10-CM | POA: Insufficient documentation

## 2013-01-27 DIAGNOSIS — L97509 Non-pressure chronic ulcer of other part of unspecified foot with unspecified severity: Secondary | ICD-10-CM | POA: Insufficient documentation

## 2013-01-27 DIAGNOSIS — E785 Hyperlipidemia, unspecified: Secondary | ICD-10-CM | POA: Insufficient documentation

## 2013-01-27 DIAGNOSIS — Z862 Personal history of diseases of the blood and blood-forming organs and certain disorders involving the immune mechanism: Secondary | ICD-10-CM | POA: Insufficient documentation

## 2013-01-27 DIAGNOSIS — R21 Rash and other nonspecific skin eruption: Secondary | ICD-10-CM | POA: Insufficient documentation

## 2013-01-27 DIAGNOSIS — I739 Peripheral vascular disease, unspecified: Secondary | ICD-10-CM

## 2013-01-27 DIAGNOSIS — F0280 Dementia in other diseases classified elsewhere without behavioral disturbance: Secondary | ICD-10-CM | POA: Insufficient documentation

## 2013-01-27 DIAGNOSIS — Z8781 Personal history of (healed) traumatic fracture: Secondary | ICD-10-CM | POA: Insufficient documentation

## 2013-01-27 DIAGNOSIS — Z794 Long term (current) use of insulin: Secondary | ICD-10-CM | POA: Insufficient documentation

## 2013-01-27 DIAGNOSIS — F329 Major depressive disorder, single episode, unspecified: Secondary | ICD-10-CM | POA: Insufficient documentation

## 2013-01-27 DIAGNOSIS — E119 Type 2 diabetes mellitus without complications: Secondary | ICD-10-CM | POA: Insufficient documentation

## 2013-01-27 DIAGNOSIS — Z792 Long term (current) use of antibiotics: Secondary | ICD-10-CM | POA: Insufficient documentation

## 2013-01-27 DIAGNOSIS — E039 Hypothyroidism, unspecified: Secondary | ICD-10-CM | POA: Insufficient documentation

## 2013-01-27 DIAGNOSIS — F3289 Other specified depressive episodes: Secondary | ICD-10-CM | POA: Insufficient documentation

## 2013-01-27 DIAGNOSIS — N186 End stage renal disease: Secondary | ICD-10-CM | POA: Insufficient documentation

## 2013-01-27 DIAGNOSIS — Z8673 Personal history of transient ischemic attack (TIA), and cerebral infarction without residual deficits: Secondary | ICD-10-CM | POA: Insufficient documentation

## 2013-01-27 DIAGNOSIS — Z9109 Other allergy status, other than to drugs and biological substances: Secondary | ICD-10-CM | POA: Insufficient documentation

## 2013-01-27 DIAGNOSIS — Z79899 Other long term (current) drug therapy: Secondary | ICD-10-CM | POA: Insufficient documentation

## 2013-01-27 DIAGNOSIS — I120 Hypertensive chronic kidney disease with stage 5 chronic kidney disease or end stage renal disease: Secondary | ICD-10-CM | POA: Insufficient documentation

## 2013-01-27 LAB — BASIC METABOLIC PANEL
GFR calc Af Amer: 14 mL/min — ABNORMAL LOW (ref >90)
GFR calc non Af Amer: 12 mL/min — ABNORMAL LOW (ref >90)
Potassium: 3.6 mEq/L (ref 3.5–5.1)
Sodium: 141 mEq/L (ref 135–145)

## 2013-01-27 LAB — CBC WITH DIFFERENTIAL/PLATELET
Basophils Relative: 0 % (ref 0–1)
Eosinophils Absolute: 0 10*3/uL (ref 0.0–0.7)
Lymphs Abs: 1.2 10*3/uL (ref 0.7–4.0)
MCH: 31.9 pg (ref 26.0–34.0)
MCHC: 30.6 g/dL (ref 30.0–36.0)
Neutrophils Relative %: 77 % (ref 43–77)
Platelets: 393 10*3/uL (ref 150–400)
RBC: 2.88 MIL/uL — ABNORMAL LOW (ref 3.87–5.11)

## 2013-01-27 LAB — GLUCOSE, CAPILLARY: Glucose-Capillary: 157 mg/dL — ABNORMAL HIGH (ref 70–99)

## 2013-01-27 NOTE — ED Notes (Signed)
Demographics faxed to Jasmine December at [facility] as requested.

## 2013-01-27 NOTE — ED Notes (Signed)
Pt transferred via [facility]. 

## 2013-01-27 NOTE — ED Provider Notes (Signed)
CSN: [id number]     Arrival date & time 01/27/13  1152 History   First MD Initiated Contact with Patient 01/27/13 1327     Chief Complaint  Patient presents with  . Wound Check   (Consider location/radiation/quality/duration/timing/severity/associated sxs/prior Treatment) Patient is a 79 y.o. female presenting with wound check. The history is provided by a caregiver (pt had a vacular procedure on her left femoral artery dec 15th.  now her left foot is purple for 2 days).  Wound Check This is a recurrent problem. The current episode started 2 days ago. The problem occurs constantly. The problem has not changed since onset.Pertinent negatives include no chest pain, no abdominal pain and no headaches. Nothing aggravates the symptoms.    Past Medical History  Diagnosis Date  . Hyperlipidemia     takes Lipitor daily  . DVT (deep venous thrombosis)   . Stroke   . Parkinson's disease     takes Sinemet daily  . Tibia/fibula fracture 01/04/2012    Treated with long leg casting. Cast change in hospital 02/27/12   . Peripheral vascular disease 02/27/2012    Skin ulcer of right great toe  . Atrial fibrillation 03/09/2012    Seen on older EKG as well but family not aware.   . Macrocytic anemia 03/09/2012  . Lower GI bleed 03/10/2012    hematochezia secondary to ulcerated polyps; diverticulosis; chronic diarrhea  . End stage renal disease on dialysis     [facility]- MWF  . Hypertension     takes Coreg daily  . Dementia     takes Aricept daily  . Diabetes mellitus, type II     takes Lantus and Novolog  . Seasonal allergies     takes Allegra daily  . Depression     takes Prozac daily  . Hypothyroidism     takes Synthroid daily   Past Surgical History  Procedure Laterality Date  . Dg av dialysis graft declot or    . Av fistula repair  Aug. 2013    Left arm  . Cholecystectomy    . Colon surgery      Aprrox 2006 at [facility] after she had a colonoscopy  . Colonoscopy  03/10/2012   Procedure: COLONOSCOPY;  Surgeon: Malissa Hippo, MD;  [facility];  Service: Endoscopy;  Laterality: N/A;  . Esophagogastroduodenoscopy  03/10/2012    Procedure: ESOPHAGOGASTRODUODENOSCOPY (EGD);  Surgeon: Malissa Hippo, MD;  [facility];  Service: Endoscopy;  Laterality: N/A;  EGD if TCS normal.  . Ligation of arteriovenous  fistula Left 03/24/2012    Procedure: LIGATION OF ARTERIOVENOUS  FISTULA;  Surgeon: Fransisco Hertz, MD;  Location: [facility] OR;  Service: Vascular;  Laterality: Left;  . Av fistula placement Right 04/28/2012    Procedure: INSERTION OF ARTERIOVENOUS (AV) GORE-TEX GRAFT ARM;  Surgeon: Sherren Kerns, MD;  Location: [facility] OR;  Service: Vascular;  Laterality: Right;   Family History  Problem Relation Age of Onset  . Stroke Mother   . Diabetes Mother   . Cancer Mother   . Hypertension Mother   . Coronary artery disease Father   . Diabetes Sister    History  Substance Use Topics  . Smoking status: Passive Smoke Exposure - Never Smoker    Types: Cigarettes  . Smokeless tobacco: Never Used  . Alcohol Use: No   OB History   Grav Para Term Preterm Abortions TAB SAB Ect Mult Living  Review of Systems  Constitutional: Negative for appetite change and fatigue.  HENT: Negative for congestion, ear discharge and sinus pressure.   Eyes: Negative for discharge.  Respiratory: Negative for cough.   Cardiovascular: Negative for chest pain.  Gastrointestinal: Negative for abdominal pain and diarrhea.  Genitourinary: Negative for frequency and hematuria.  Musculoskeletal: Negative for back pain.  Skin: Positive for wound. Negative for rash.  Neurological: Negative for seizures and headaches.  Psychiatric/Behavioral: Negative for hallucinations.    Allergies  Hydralazine  Home Medications   Current Outpatient Rx  Name  Route  Sig  Dispense  Refill  . atorvastatin (LIPITOR) 10 MG tablet   Oral   Take 10 mg by mouth daily.          .  calcium acetate, Phos Binder, (PHOSLYRA) 667 MG/5ML SOLN   Oral   Take 667-1,334 mg by mouth 3 (three) times daily with meals. Takes with snacks and with meals         . carbidopa-levodopa (SINEMET IR) 25-100 MG per tablet   Oral   Take 1.5 tablets by mouth 3 (three) times daily.         . carvedilol (COREG) 12.5 MG tablet   Oral   Take 12.5 mg by mouth 2 (two) times daily with a meal.         . cinacalcet (SENSIPAR) 30 MG tablet   Oral   Take 30 mg by mouth daily.         Marland Kitchen donepezil (ARICEPT) 10 MG tablet   Oral   Take 10 mg by mouth daily.         Marland Kitchen FLUoxetine (PROZAC) 40 MG capsule   Oral   Take 40 mg by mouth daily.         . insulin glargine (LANTUS) 100 UNIT/ML injection   Subcutaneous   Inject 5 Units into the skin at bedtime.          . insulin NPH-regular (NOVOLIN 70/30) (70-30) 100 UNIT/ML injection   Subcutaneous   Inject 1-5 Units into the skin 3 (three) times daily. Per sliding scale         . levofloxacin (LEVAQUIN) 250 MG tablet   Oral   Take 1 tablet (250 mg total) by mouth daily.   5 tablet   0   . levothyroxine (SYNTHROID, LEVOTHROID) 25 MCG tablet   Oral   Take 25 mcg by mouth every morning.         . Loratadine-Pseudoephedrine (CLARITIN-D 24 HOUR PO)   Oral   Take by mouth daily.         . multivitamin (RENA-VIT) TABS tablet   Oral   Take 1 tablet by mouth daily.          BP 131/47  Pulse 70  Temp(Src) 97.6 F (36.4 C) (Oral)  Resp 16  Ht 5\' 9"  (1.753 m)  Wt 154 lb 5.2 oz (70 kg)  BMI 22.78 kg/m2  SpO2 94% Physical Exam  Constitutional: She is oriented to person, place, and time. She appears well-developed.  HENT:  Head: Normocephalic.  Eyes: Conjunctivae and EOM are normal. No scleral icterus.  Neck: Neck supple. No thyromegaly present.  Cardiovascular: Normal rate and regular rhythm.  Exam reveals no gallop and no friction rub.   No murmur heard. Pulmonary/Chest: No stridor. She has no wheezes.  She has no rales. She exhibits no tenderness.  Abdominal: She exhibits no distension. There is no tenderness. There is no  rebound.  Musculoskeletal: Normal range of motion. She exhibits no edema.  Distal left foot with purple discoloration.  No dp or tibial pulses  Lymphadenopathy:    She has no cervical adenopathy.  Neurological: She is oriented to person, place, and time. She exhibits normal muscle tone. Coordination normal.  Skin: Rash noted. No erythema.  Psychiatric: She has a normal mood and affect. Her behavior is normal.    ED Course  Procedures (including critical care time) Labs Review Labs Reviewed  GLUCOSE, CAPILLARY - Abnormal; Notable for the following:    Glucose-Capillary 157 (*)    All other components within normal limits  CBC WITH DIFFERENTIAL - Abnormal; Notable for the following:    WBC 15.8 (*)    RBC 2.88 (*)    Hemoglobin 9.2 (*)    HCT 30.1 (*)    MCV 104.5 (*)    RDW 17.2 (*)    Neutro Abs 12.1 (*)    Lymphocytes Relative 8 (*)    Monocytes Relative 15 (*)    Monocytes Absolute 2.4 (*)    All other components within normal limits  BASIC METABOLIC PANEL - Abnormal; Notable for the following:    Chloride 92 (*)    CO2 35 (*)    Glucose, Bld 188 (*)    BUN 26 (*)    Creatinine, Ser 3.37 (*)    Calcium 10.9 (*)    GFR calc non Af Amer 12 (*)    GFR calc Af Amer 14 (*)    All other components within normal limits   Imaging Review No results found.  EKG Interpretation   None     I spoke with vascular surgery at [facility]  [place].  They are accepting the pt.  She will be transported by ems  MDM      Benny Lennert, MD 01/27/13 [phone]

## 2013-01-27 NOTE — ED Notes (Signed)
Pt reports was told by patient's caregiver she has a water blister on top of left foot.  Reports just noticed the wound today.  Family reports pt's cbg was 190 this am.

## 2013-01-27 NOTE — ED Notes (Signed)
Unable to palpate pedal pulse at triage.  Left foot discolored more so than the right.  Family reports pt had  Procedure approx 1 week ago  to increase blood flow in legs.

## 2013-02-12 ENCOUNTER — Encounter (HOSPITAL_COMMUNITY): Payer: Self-pay | Admitting: Emergency Medicine

## 2013-02-12 ENCOUNTER — Inpatient Hospital Stay (HOSPITAL_COMMUNITY)
Admission: EM | Admit: 2013-02-12 | Discharge: 2013-02-17 | DRG: 377 | Disposition: A | Discharge status: Home Health | Payer: Medicare Other | Attending: Internal Medicine | Admitting: Internal Medicine

## 2013-02-12 DIAGNOSIS — I120 Hypertensive chronic kidney disease with stage 5 chronic kidney disease or end stage renal disease: Secondary | ICD-10-CM | POA: Diagnosis present

## 2013-02-12 DIAGNOSIS — K2991 Gastroduodenitis, unspecified, with bleeding: Secondary | ICD-10-CM

## 2013-02-12 DIAGNOSIS — I739 Peripheral vascular disease, unspecified: Secondary | ICD-10-CM

## 2013-02-12 DIAGNOSIS — R4182 Altered mental status, unspecified: Secondary | ICD-10-CM

## 2013-02-12 DIAGNOSIS — Z794 Long term (current) use of insulin: Secondary | ICD-10-CM

## 2013-02-12 DIAGNOSIS — N2581 Secondary hyperparathyroidism of renal origin: Secondary | ICD-10-CM | POA: Diagnosis present

## 2013-02-12 DIAGNOSIS — L89152 Pressure ulcer of sacral region, stage 2: Secondary | ICD-10-CM | POA: Diagnosis present

## 2013-02-12 DIAGNOSIS — T82898A Other specified complication of vascular prosthetic devices, implants and grafts, initial encounter: Secondary | ICD-10-CM

## 2013-02-12 DIAGNOSIS — L8992 Pressure ulcer of unspecified site, stage 2: Secondary | ICD-10-CM | POA: Diagnosis present

## 2013-02-12 DIAGNOSIS — Z7982 Long term (current) use of aspirin: Secondary | ICD-10-CM

## 2013-02-12 DIAGNOSIS — E039 Hypothyroidism, unspecified: Secondary | ICD-10-CM | POA: Diagnosis present

## 2013-02-12 DIAGNOSIS — Z79899 Other long term (current) drug therapy: Secondary | ICD-10-CM

## 2013-02-12 DIAGNOSIS — M899 Disorder of bone, unspecified: Secondary | ICD-10-CM | POA: Diagnosis present

## 2013-02-12 DIAGNOSIS — K2990 Gastroduodenitis, unspecified, without bleeding: Secondary | ICD-10-CM

## 2013-02-12 DIAGNOSIS — L97509 Non-pressure chronic ulcer of other part of unspecified foot with unspecified severity: Secondary | ICD-10-CM | POA: Diagnosis present

## 2013-02-12 DIAGNOSIS — Z992 Dependence on renal dialysis: Secondary | ICD-10-CM

## 2013-02-12 DIAGNOSIS — S78119A Complete traumatic amputation at level between unspecified hip and knee, initial encounter: Secondary | ICD-10-CM

## 2013-02-12 DIAGNOSIS — Z86718 Personal history of other venous thrombosis and embolism: Secondary | ICD-10-CM

## 2013-02-12 DIAGNOSIS — K2971 Gastritis, unspecified, with bleeding: Secondary | ICD-10-CM | POA: Diagnosis present

## 2013-02-12 DIAGNOSIS — F3289 Other specified depressive episodes: Secondary | ICD-10-CM | POA: Diagnosis present

## 2013-02-12 DIAGNOSIS — F0390 Unspecified dementia without behavioral disturbance: Secondary | ICD-10-CM

## 2013-02-12 DIAGNOSIS — F329 Major depressive disorder, single episode, unspecified: Secondary | ICD-10-CM | POA: Diagnosis present

## 2013-02-12 DIAGNOSIS — I5022 Chronic systolic (congestive) heart failure: Secondary | ICD-10-CM

## 2013-02-12 DIAGNOSIS — I4891 Unspecified atrial fibrillation: Secondary | ICD-10-CM

## 2013-02-12 DIAGNOSIS — E119 Type 2 diabetes mellitus without complications: Secondary | ICD-10-CM

## 2013-02-12 DIAGNOSIS — D539 Nutritional anemia, unspecified: Secondary | ICD-10-CM

## 2013-02-12 DIAGNOSIS — K922 Gastrointestinal hemorrhage, unspecified: Principal | ICD-10-CM

## 2013-02-12 DIAGNOSIS — M949 Disorder of cartilage, unspecified: Secondary | ICD-10-CM

## 2013-02-12 DIAGNOSIS — I10 Essential (primary) hypertension: Secondary | ICD-10-CM

## 2013-02-12 DIAGNOSIS — Z8673 Personal history of transient ischemic attack (TIA), and cerebral infarction without residual deficits: Secondary | ICD-10-CM

## 2013-02-12 DIAGNOSIS — K449 Diaphragmatic hernia without obstruction or gangrene: Secondary | ICD-10-CM

## 2013-02-12 DIAGNOSIS — E785 Hyperlipidemia, unspecified: Secondary | ICD-10-CM

## 2013-02-12 DIAGNOSIS — A4902 Methicillin resistant Staphylococcus aureus infection, unspecified site: Secondary | ICD-10-CM | POA: Diagnosis present

## 2013-02-12 DIAGNOSIS — N186 End stage renal disease: Secondary | ICD-10-CM

## 2013-02-12 DIAGNOSIS — D62 Acute posthemorrhagic anemia: Secondary | ICD-10-CM

## 2013-02-12 DIAGNOSIS — R578 Other shock: Secondary | ICD-10-CM

## 2013-02-12 DIAGNOSIS — G20 Parkinson's disease: Secondary | ICD-10-CM

## 2013-02-12 DIAGNOSIS — G20A1 Parkinson's disease without dyskinesia, without mention of fluctuations: Secondary | ICD-10-CM

## 2013-02-12 DIAGNOSIS — R5381 Other malaise: Secondary | ICD-10-CM | POA: Diagnosis present

## 2013-02-12 DIAGNOSIS — L89109 Pressure ulcer of unspecified part of back, unspecified stage: Secondary | ICD-10-CM | POA: Diagnosis present

## 2013-02-12 DIAGNOSIS — K2970 Gastritis, unspecified, without bleeding: Secondary | ICD-10-CM

## 2013-02-12 LAB — COMPREHENSIVE METABOLIC PANEL
ALBUMIN: 2.2 g/dL — AB (ref 3.5–5.2)
ALK PHOS: 120 U/L — AB (ref 39–117)
AST: 20 U/L (ref 0–37)
BUN: 12 mg/dL (ref 6–23)
CO2: 27 mEq/L (ref 19–32)
Calcium: 9.9 mg/dL (ref 8.4–10.5)
Chloride: 94 mEq/L — ABNORMAL LOW (ref 96–112)
Creatinine, Ser: 1.61 mg/dL — ABNORMAL HIGH (ref 0.50–1.10)
GFR calc Af Amer: 34 mL/min — ABNORMAL LOW (ref >90)
GFR calc non Af Amer: 30 mL/min — ABNORMAL LOW (ref >90)
Glucose, Bld: 161 mg/dL — ABNORMAL HIGH (ref 70–99)
POTASSIUM: 3.7 meq/L (ref 3.7–5.3)
SODIUM: 140 meq/L (ref 137–147)
TOTAL PROTEIN: 7.1 g/dL (ref 6.0–8.3)
Total Bilirubin: 0.5 mg/dL (ref 0.3–1.2)

## 2013-02-12 LAB — CBC WITH DIFFERENTIAL/PLATELET
BASOS ABS: 0.1 10*3/uL (ref 0.0–0.1)
Basophils Relative: 0 % (ref 0–1)
EOS PCT: 0 % (ref 0–5)
Eosinophils Absolute: 0.1 10*3/uL (ref 0.0–0.7)
HEMATOCRIT: 36.3 % (ref 36.0–46.0)
HEMOGLOBIN: 11.2 g/dL — AB (ref 12.0–15.0)
LYMPHS ABS: 1.1 10*3/uL (ref 0.7–4.0)
LYMPHS PCT: 6 % — AB (ref 12–46)
MCH: 30.9 pg (ref 26.0–34.0)
MCHC: 30.9 g/dL (ref 30.0–36.0)
MCV: 100 fL (ref 78.0–100.0)
MONO ABS: 1.3 10*3/uL — AB (ref 0.1–1.0)
MONOS PCT: 7 % (ref 3–12)
NEUTROS ABS: 17.1 10*3/uL — AB (ref 1.7–7.7)
Neutrophils Relative %: 87 % — ABNORMAL HIGH (ref 43–77)
Platelets: 414 10*3/uL — ABNORMAL HIGH (ref 150–400)
RBC: 3.63 MIL/uL — AB (ref 3.87–5.11)
RDW: 17.1 % — AB (ref 11.5–15.5)
WBC: 19.7 10*3/uL — AB (ref 4.0–10.5)

## 2013-02-12 LAB — TYPE AND SCREEN
ABO/RH(D): O POS
Antibody Screen: NEGATIVE

## 2013-02-12 LAB — PROTIME-INR
INR: 1.41 (ref 0.00–1.49)
PROTHROMBIN TIME: 16.9 s — AB (ref 11.6–15.2)

## 2013-02-12 LAB — APTT: APTT: 48 s — AB (ref 24–37)

## 2013-02-12 LAB — LACTIC ACID, PLASMA: LACTIC ACID, VENOUS: 3.3 mmol/L — AB (ref 0.5–2.2)

## 2013-02-12 MED ORDER — SODIUM CHLORIDE 0.9 % IV SOLN
INTRAVENOUS | Status: DC
Start: 2013-02-12 — End: 2013-02-13

## 2013-02-12 MED ORDER — SODIUM CHLORIDE 0.9 % IV BOLUS (SEPSIS)
1000.0000 mL | Freq: Once | INTRAVENOUS | Status: AC
  Administered 2013-02-12: 1000 mL via INTRAVENOUS

## 2013-02-12 NOTE — ED Notes (Signed)
Granddaughter states patient usually responds verbally and now when questioned by grand daughter patient whispered yes to her question.  Family states pt. Has not had an appetite and has not been engaged / interested since she had her amputation

## 2013-02-12 NOTE — ED Notes (Signed)
Per caregivers - patient received dialysis yesterday and today.   Today Pre-tx weight 57.5 kg  Post treatment weight 56.5 kg

## 2013-02-12 NOTE — ED Notes (Signed)
Asked grand daughter about patients code status and she states patient is a full code.

## 2013-02-12 NOTE — ED Provider Notes (Signed)
CSN: [id number]     Arrival date & time 02/12/13  2136 History  This chart was scribed for Katrina Jacobsen, MD by Elby Beck, ED Scribe. This patient was seen in room APA04/APA04 and the patient's care was started at 9:48 PM.   Chief Complaint  Patient presents with  . Post-op Problem  . Rectal Bleeding  . Near Syncope    The history is provided by the patient. No language interpreter was used.    HPI Comments: Katrina Meyer is a 80 y.o. female who presents to the Emergency Department complaining of a syncopal episode that occurred while sitting earlier today. Pt's relative reports that pt had her left leg amputated in a hospital in [place] 2 days ago. Relative states that pt has been eating less than usual over the past 2 days and that relative has also been noticed to be pale today. Relative states that pt has not had any bleeding from the site of her amputation. Relative states that pt has been on Heparin and Aspirin since the amputation. Relative also states that pt has had rectal bleeding recently. She states that she has not seen pt pass any clots, but that she has had a "tint" of red in her stools. Relative states that pt has a history of colon polyps, but no prior history of GI bleeding. Pt denies emesis or any other symptoms.   Past Medical History  Diagnosis Date  . Hyperlipidemia     takes Lipitor daily  . DVT (deep venous thrombosis)   . Stroke   . Parkinson's disease     takes Sinemet daily  . Tibia/fibula fracture 01/04/2012    Treated with long leg casting. Cast change in hospital 02/27/12   . Peripheral vascular disease 02/27/2012    Skin ulcer of right great toe  . Atrial fibrillation 03/09/2012    Seen on older EKG as well but family not aware.   . Macrocytic anemia 03/09/2012  . Lower GI bleed 03/10/2012    hematochezia secondary to ulcerated polyps; diverticulosis; chronic diarrhea  . End stage renal disease on dialysis     [facility]- MWF  . Hypertension      takes Coreg daily  . Dementia     takes Aricept daily  . Diabetes mellitus, type II     takes Lantus and Novolog  . Seasonal allergies     takes Allegra daily  . Depression     takes Prozac daily  . Hypothyroidism     takes Synthroid daily   Past Surgical History  Procedure Laterality Date  . Dg av dialysis graft declot or    . Av fistula repair  Aug. 2013    Left arm  . Cholecystectomy    . Colon surgery      Aprrox 2006 at [facility] after she had a colonoscopy  . Colonoscopy  03/10/2012    Procedure: COLONOSCOPY;  Surgeon: Rogene Houston, MD;  [facility];  Service: Endoscopy;  Laterality: N/A;  . Esophagogastroduodenoscopy  03/10/2012    Procedure: ESOPHAGOGASTRODUODENOSCOPY (EGD);  Surgeon: Rogene Houston, MD;  [facility];  Service: Endoscopy;  Laterality: N/A;  EGD if TCS normal.  . Ligation of arteriovenous  fistula Left 03/24/2012    Procedure: LIGATION OF ARTERIOVENOUS  FISTULA;  Surgeon: Conrad Itasca, MD;  Location: [place];  Service: Vascular;  Laterality: Left;  . Av fistula placement Right 04/28/2012    Procedure: INSERTION OF ARTERIOVENOUS (AV) GORE-TEX  GRAFT ARM;  Surgeon: Elam Dutch, MD;  Location: [facility] OR;  Service: Vascular;  Laterality: Right;   Family History  Problem Relation Age of Onset  . Stroke Mother   . Diabetes Mother   . Cancer Mother   . Hypertension Mother   . Coronary artery disease Father   . Diabetes Sister    History  Substance Use Topics  . Smoking status: Passive Smoke Exposure - Never Smoker    Types: Cigarettes  . Smokeless tobacco: Never Used  . Alcohol Use: No   OB History   Grav Para Term Preterm Abortions TAB SAB Ect Mult Living                 Review of Systems  Gastrointestinal: Positive for anal bleeding. Negative for vomiting.  Skin: Positive for pallor.  Neurological: Positive for syncope.  All other systems reviewed and are negative.   Allergies  Hydralazine  Home Medications   Current  Outpatient Rx  Name  Route  Sig  Dispense  Refill  . atorvastatin (LIPITOR) 10 MG tablet   Oral   Take 10 mg by mouth daily.          . calcium acetate, Phos Binder, (PHOSLYRA) 667 MG/5ML SOLN   Oral   Take 667-1,334 mg by mouth 3 (three) times daily with meals. Takes 57mls with snacks and 33mls with meals         . carbidopa-levodopa (SINEMET IR) 25-100 MG per tablet   Oral   Take 1.5 tablets by mouth 3 (three) times daily.         . carvedilol (COREG) 12.5 MG tablet   Oral   Take 12.5 mg by mouth 2 (two) times daily with a meal.         . cinacalcet (SENSIPAR) 30 MG tablet   Oral   Take 30 mg by mouth daily.         Marland Kitchen donepezil (ARICEPT) 10 MG tablet   Oral   Take 10 mg by mouth daily.         Marland Kitchen FLUoxetine (PROZAC) 40 MG capsule   Oral   Take 40 mg by mouth daily.         . insulin glargine (LANTUS) 100 UNIT/ML injection   Subcutaneous   Inject 5 Units into the skin at bedtime.          . insulin NPH-regular (NOVOLIN 70/30) (70-30) 100 UNIT/ML injection   Subcutaneous   Inject 1-5 Units into the skin 3 (three) times daily. Per sliding scale         . levofloxacin (LEVAQUIN) 250 MG tablet   Oral   Take 1 tablet (250 mg total) by mouth daily.   5 tablet   0   . levothyroxine (SYNTHROID, LEVOTHROID) 25 MCG tablet   Oral   Take 25 mcg by mouth every morning.         . Loratadine-Pseudoephedrine (CLARITIN-D 24 HOUR PO)   Oral   Take by mouth daily.         . multivitamin (RENA-VIT) TABS tablet   Oral   Take 1 tablet by mouth daily.          Triage Vitals: BP 107/52  Pulse 92  Temp(Src) 98.1 F (36.7 C) (Oral)  Resp 22  Wt 124 lb 9 oz (56.501 kg)  SpO2 98%  Physical Exam  Nursing note and vitals reviewed. Constitutional: She is oriented to person, place, and time. She appears well-developed  and well-nourished.  Non-toxic appearance. No distress.  HENT:  Head: Normocephalic and atraumatic.  Eyes: Conjunctivae, EOM and lids are  normal. Pupils are equal, round, and reactive to light.  Pale conjunctiva.  Neck: Normal range of motion. Neck supple. No tracheal deviation present. No mass present.  Cardiovascular: Normal rate, regular rhythm and normal heart sounds.  Exam reveals no gallop.   No murmur heard. Pulmonary/Chest: Effort normal and breath sounds normal. No stridor. No respiratory distress. She has no decreased breath sounds. She has no wheezes. She has no rhonchi. She has no rales.  Abdominal: Soft. Normal appearance and bowel sounds are normal. She exhibits no distension. There is no tenderness. There is no rebound and no CVA tenderness.  Genitourinary:  Grossly bloody stool at the anus.  Musculoskeletal: Normal range of motion. She exhibits no edema and no tenderness.  At the left BKA stump, the incision is clean, dry and intact. Staples are normal. No signs of blood.   Neurological: She is alert and oriented to person, place, and time. She has normal strength. No cranial nerve deficit or sensory deficit. GCS eye subscore is 4. GCS verbal subscore is 5. GCS motor subscore is 6.  Skin: Skin is warm and dry. No abrasion and no rash noted.  Psychiatric: She has a normal mood and affect. Her speech is normal and behavior is normal.    ED Course  Procedures (including critical care time)  DIAGNOSTIC STUDIES: Oxygen Saturation is 98% on RA, normal by my interpretation.    COORDINATION OF CARE: 9:59 PM- Pt advised of plan for treatment and pt agrees.  Labs Review Labs Reviewed  CBC WITH DIFFERENTIAL  COMPREHENSIVE METABOLIC PANEL  PROTIME-INR  APTT  TYPE AND SCREEN   Imaging Review No results found.  EKG Interpretation   None       MDM  No diagnosis found.   Date: 02/12/2013  Rate: 117  Rhythm: atrial fibrillation  QRS Axis: normal  Intervals: QT prolonged  ST/T Wave abnormalities: nonspecific ST changes  Conduction Disutrbances:none  Narrative Interpretation:   Old EKG Reviewed: none  available   I personally performed the services described in this documentation, which was scribed in my presence. The recorded information has been reviewed and is accurate.   11:21 PM Pt to be admitted for eval of her GI bleed  Katrina Jacobsen, MD 02/12/13 2321

## 2013-02-12 NOTE — ED Notes (Signed)
Patient unable to answer questions. Care giver states that patient has been having blood in stool, and has a history of the same when placed on blood thinners. Had left leg amputated two days ago per Caregiver (granddaughter). Family states that she passed out, went limp, eyes rolled back in her head. Patient has not been eating at home since she came home yesterday.

## 2013-02-13 ENCOUNTER — Ambulatory Visit (HOSPITAL_COMMUNITY): Payer: Medicare Other

## 2013-02-13 ENCOUNTER — Inpatient Hospital Stay (HOSPITAL_COMMUNITY): Payer: Medicare Other

## 2013-02-13 ENCOUNTER — Encounter (HOSPITAL_COMMUNITY): Payer: Self-pay | Admitting: Internal Medicine

## 2013-02-13 ENCOUNTER — Inpatient Hospital Stay (HOSPITAL_COMMUNITY): Admit: 2013-02-13 | Discharge: 2013-02-13 | Disposition: A | Discharge status: Home / Self Care | Payer: Medicare Other

## 2013-02-13 DIAGNOSIS — K449 Diaphragmatic hernia without obstruction or gangrene: Secondary | ICD-10-CM | POA: Diagnosis present

## 2013-02-13 DIAGNOSIS — E785 Hyperlipidemia, unspecified: Secondary | ICD-10-CM | POA: Diagnosis present

## 2013-02-13 DIAGNOSIS — Z794 Long term (current) use of insulin: Secondary | ICD-10-CM | POA: Diagnosis not present

## 2013-02-13 DIAGNOSIS — F329 Major depressive disorder, single episode, unspecified: Secondary | ICD-10-CM | POA: Diagnosis present

## 2013-02-13 DIAGNOSIS — K921 Melena: Secondary | ICD-10-CM | POA: Diagnosis present

## 2013-02-13 DIAGNOSIS — R578 Other shock: Secondary | ICD-10-CM | POA: Diagnosis present

## 2013-02-13 DIAGNOSIS — Z86718 Personal history of other venous thrombosis and embolism: Secondary | ICD-10-CM | POA: Diagnosis not present

## 2013-02-13 DIAGNOSIS — Z992 Dependence on renal dialysis: Secondary | ICD-10-CM | POA: Diagnosis not present

## 2013-02-13 DIAGNOSIS — F0390 Unspecified dementia without behavioral disturbance: Secondary | ICD-10-CM

## 2013-02-13 DIAGNOSIS — G20 Parkinson's disease: Secondary | ICD-10-CM | POA: Diagnosis present

## 2013-02-13 DIAGNOSIS — D539 Nutritional anemia, unspecified: Secondary | ICD-10-CM | POA: Diagnosis present

## 2013-02-13 DIAGNOSIS — A4902 Methicillin resistant Staphylococcus aureus infection, unspecified site: Secondary | ICD-10-CM | POA: Diagnosis present

## 2013-02-13 DIAGNOSIS — S78119A Complete traumatic amputation at level between unspecified hip and knee, initial encounter: Secondary | ICD-10-CM | POA: Diagnosis not present

## 2013-02-13 DIAGNOSIS — I4891 Unspecified atrial fibrillation: Secondary | ICD-10-CM | POA: Diagnosis present

## 2013-02-13 DIAGNOSIS — L97509 Non-pressure chronic ulcer of other part of unspecified foot with unspecified severity: Secondary | ICD-10-CM | POA: Diagnosis present

## 2013-02-13 DIAGNOSIS — R4182 Altered mental status, unspecified: Secondary | ICD-10-CM | POA: Diagnosis present

## 2013-02-13 DIAGNOSIS — L89152 Pressure ulcer of sacral region, stage 2: Secondary | ICD-10-CM | POA: Diagnosis present

## 2013-02-13 DIAGNOSIS — Z79899 Other long term (current) drug therapy: Secondary | ICD-10-CM | POA: Diagnosis not present

## 2013-02-13 DIAGNOSIS — K2971 Gastritis, unspecified, with bleeding: Secondary | ICD-10-CM | POA: Diagnosis present

## 2013-02-13 DIAGNOSIS — I5022 Chronic systolic (congestive) heart failure: Secondary | ICD-10-CM

## 2013-02-13 DIAGNOSIS — E119 Type 2 diabetes mellitus without complications: Secondary | ICD-10-CM | POA: Diagnosis present

## 2013-02-13 DIAGNOSIS — Z8673 Personal history of transient ischemic attack (TIA), and cerebral infarction without residual deficits: Secondary | ICD-10-CM | POA: Diagnosis not present

## 2013-02-13 DIAGNOSIS — M899 Disorder of bone, unspecified: Secondary | ICD-10-CM | POA: Diagnosis present

## 2013-02-13 DIAGNOSIS — F3289 Other specified depressive episodes: Secondary | ICD-10-CM | POA: Diagnosis present

## 2013-02-13 DIAGNOSIS — D62 Acute posthemorrhagic anemia: Secondary | ICD-10-CM | POA: Diagnosis present

## 2013-02-13 DIAGNOSIS — K922 Gastrointestinal hemorrhage, unspecified: Secondary | ICD-10-CM | POA: Diagnosis present

## 2013-02-13 DIAGNOSIS — N2581 Secondary hyperparathyroidism of renal origin: Secondary | ICD-10-CM | POA: Diagnosis present

## 2013-02-13 DIAGNOSIS — M949 Disorder of cartilage, unspecified: Secondary | ICD-10-CM | POA: Diagnosis present

## 2013-02-13 DIAGNOSIS — L8992 Pressure ulcer of unspecified site, stage 2: Secondary | ICD-10-CM | POA: Diagnosis present

## 2013-02-13 DIAGNOSIS — N186 End stage renal disease: Secondary | ICD-10-CM | POA: Diagnosis present

## 2013-02-13 DIAGNOSIS — K2991 Gastroduodenitis, unspecified, with bleeding: Secondary | ICD-10-CM | POA: Diagnosis present

## 2013-02-13 DIAGNOSIS — E039 Hypothyroidism, unspecified: Secondary | ICD-10-CM | POA: Diagnosis present

## 2013-02-13 DIAGNOSIS — I739 Peripheral vascular disease, unspecified: Secondary | ICD-10-CM | POA: Diagnosis present

## 2013-02-13 DIAGNOSIS — R5381 Other malaise: Secondary | ICD-10-CM | POA: Diagnosis present

## 2013-02-13 DIAGNOSIS — Z7982 Long term (current) use of aspirin: Secondary | ICD-10-CM | POA: Diagnosis not present

## 2013-02-13 DIAGNOSIS — I120 Hypertensive chronic kidney disease with stage 5 chronic kidney disease or end stage renal disease: Secondary | ICD-10-CM | POA: Diagnosis present

## 2013-02-13 DIAGNOSIS — L89109 Pressure ulcer of unspecified part of back, unspecified stage: Secondary | ICD-10-CM | POA: Diagnosis present

## 2013-02-13 LAB — CBC
HCT: 29.4 % — ABNORMAL LOW (ref 36.0–46.0)
HCT: 28.8 % — ABNORMAL LOW (ref 36.0–46.0)
Hemoglobin: 9 g/dL — ABNORMAL LOW (ref 12.0–15.0)
Hemoglobin: 8.8 g/dL — ABNORMAL LOW (ref 12.0–15.0)
MCH: 30.2 pg (ref 26.0–34.0)
MCH: 30.4 pg (ref 26.0–34.0)
MCHC: 30.6 g/dL (ref 30.0–36.0)
MCHC: 30.6 g/dL (ref 30.0–36.0)
MCV: 98.7 fL (ref 78.0–100.0)
MCV: 99.7 fL (ref 78.0–100.0)
PLATELETS: 342 10*3/uL (ref 150–400)
PLATELETS: 340 10*3/uL (ref 150–400)
RBC: 2.98 MIL/uL — ABNORMAL LOW (ref 3.87–5.11)
RBC: 2.89 MIL/uL — ABNORMAL LOW (ref 3.87–5.11)
RDW: 17.1 % — ABNORMAL HIGH (ref 11.5–15.5)
RDW: 17.3 % — AB (ref 11.5–15.5)
WBC: 17.2 10*3/uL — ABNORMAL HIGH (ref 4.0–10.5)
WBC: 16.1 10*3/uL — ABNORMAL HIGH (ref 4.0–10.5)

## 2013-02-13 LAB — GLUCOSE, CAPILLARY
GLUCOSE-CAPILLARY: 96 mg/dL (ref 70–99)
GLUCOSE-CAPILLARY: 90 mg/dL (ref 70–99)
GLUCOSE-CAPILLARY: 137 mg/dL — AB (ref 70–99)
Glucose-Capillary: 121 mg/dL — ABNORMAL HIGH (ref 70–99)
Glucose-Capillary: 101 mg/dL — ABNORMAL HIGH (ref 70–99)
Glucose-Capillary: 279 mg/dL — ABNORMAL HIGH (ref 70–99)

## 2013-02-13 LAB — COMPREHENSIVE METABOLIC PANEL
ALBUMIN: 1.7 g/dL — AB (ref 3.5–5.2)
ALK PHOS: 90 U/L (ref 39–117)
ALT: <5 U/L (ref 0–35)
AST: 12 U/L (ref 0–37)
BUN: 16 mg/dL (ref 6–23)
CHLORIDE: 103 meq/L (ref 96–112)
CO2: 25 mEq/L (ref 19–32)
Calcium: 8.8 mg/dL (ref 8.4–10.5)
Creatinine, Ser: 2.09 mg/dL — ABNORMAL HIGH (ref 0.50–1.10)
GFR calc non Af Amer: 22 mL/min — ABNORMAL LOW (ref >90)
GFR, EST AFRICAN AMERICAN: 25 mL/min — AB (ref >90)
Glucose, Bld: 104 mg/dL — ABNORMAL HIGH (ref 70–99)
Potassium: 3.3 mEq/L — ABNORMAL LOW (ref 3.7–5.3)
Sodium: 143 mEq/L (ref 137–147)
Total Bilirubin: 0.2 mg/dL — ABNORMAL LOW (ref 0.3–1.2)
Total Protein: 5.3 g/dL — ABNORMAL LOW (ref 6.0–8.3)

## 2013-02-13 LAB — MRSA PCR SCREENING: MRSA BY PCR: POSITIVE — AB

## 2013-02-13 LAB — LACTIC ACID, PLASMA: LACTIC ACID, VENOUS: 0.8 mmol/L (ref 0.5–2.2)

## 2013-02-13 LAB — PHOSPHORUS: PHOSPHORUS: 4.3 mg/dL (ref 2.3–4.6)

## 2013-02-13 LAB — MAGNESIUM: MAGNESIUM: 1.8 mg/dL (ref 1.5–2.5)

## 2013-02-13 LAB — PROCALCITONIN: Procalcitonin: 4.96 ng/mL

## 2013-02-13 LAB — PROTIME-INR
INR: 1.53 — ABNORMAL HIGH (ref 0.00–1.49)
PROTHROMBIN TIME: 18 s — AB (ref 11.6–15.2)

## 2013-02-13 LAB — APTT: aPTT: 52 seconds — ABNORMAL HIGH (ref 24–37)

## 2013-02-13 LAB — LIPASE, BLOOD: LIPASE: 15 U/L (ref 11–59)

## 2013-02-13 LAB — ABO/RH: ABO/RH(D): O POS

## 2013-02-13 MED ORDER — SODIUM CHLORIDE 0.9 % IV SOLN
INTRAVENOUS | Status: DC
  Administered 2013-02-13: 09:00:00 via INTRAVENOUS

## 2013-02-13 MED ORDER — SODIUM CHLORIDE 0.9 % IV BOLUS (SEPSIS)
500.0000 mL | Freq: Once | INTRAVENOUS | Status: AC
  Administered 2013-02-13: 500 mL via INTRAVENOUS

## 2013-02-13 MED ORDER — PANTOPRAZOLE SODIUM 40 MG IV SOLR
INTRAVENOUS | Status: AC
  Filled 2013-02-13: qty 160

## 2013-02-13 MED ORDER — ACETAMINOPHEN 325 MG PO TABS
325.0000 mg | ORAL_TABLET | Freq: Once | ORAL | Status: AC
  Administered 2013-02-13: 325 mg via ORAL
  Filled 2013-02-13: qty 1

## 2013-02-13 MED ORDER — DARBEPOETIN ALFA-POLYSORBATE 40 MCG/0.4ML IJ SOLN: 40.0000 ug | INTRAMUSCULAR | Status: DC

## 2013-02-13 MED ORDER — SODIUM CHLORIDE 0.9 % IV BOLUS (SEPSIS)
500.0000 mL | Freq: Once | INTRAVENOUS | Status: DC
Start: 2013-02-13 — End: 2013-02-13

## 2013-02-13 MED ORDER — CHLORHEXIDINE GLUCONATE CLOTH 2 % EX PADS
6.0000 | MEDICATED_PAD | Freq: Every day | CUTANEOUS | Status: DC
  Administered 2013-02-13 – 2013-02-17 (×4): 6 via TOPICAL

## 2013-02-13 MED ORDER — SODIUM CHLORIDE 0.9 % IV SOLN
62.5000 mg | INTRAVENOUS | Status: DC
  Administered 2013-02-15 – 2013-02-17 (×2): 62.5 mg via INTRAVENOUS
  Filled 2013-02-13 (×3): qty 5

## 2013-02-13 MED ORDER — SODIUM CHLORIDE 0.9 % IV SOLN
INTRAVENOUS | Status: DC
  Administered 2013-02-13: 21:00:00 via INTRAVENOUS

## 2013-02-13 MED ORDER — LEVOTHYROXINE SODIUM 25 MCG PO TABS
25.0000 ug | ORAL_TABLET | Freq: Every morning | ORAL | Status: DC
  Administered 2013-02-14 – 2013-02-17 (×3): 25 ug via ORAL
  Filled 2013-02-13 (×6): qty 1

## 2013-02-13 MED ORDER — BIOTENE DRY MOUTH MT LIQD
15.0000 mL | Freq: Two times a day (BID) | OROMUCOSAL | Status: DC
  Administered 2013-02-13 – 2013-02-17 (×7): 15 mL via OROMUCOSAL

## 2013-02-13 MED ORDER — RENA-VITE PO TABS
1.0000 | ORAL_TABLET | Freq: Two times a day (BID) | ORAL | Status: DC
  Administered 2013-02-13 – 2013-02-17 (×6): 1 via ORAL
  Filled 2013-02-13 (×10): qty 1

## 2013-02-13 MED ORDER — CARBIDOPA-LEVODOPA 25-100 MG PO TABS
1.5000 | ORAL_TABLET | Freq: Three times a day (TID) | ORAL | Status: DC
  Administered 2013-02-13 – 2013-02-17 (×11): 1.5 via ORAL
  Filled 2013-02-13 (×15): qty 1.5

## 2013-02-13 MED ORDER — CINACALCET HCL 30 MG PO TABS
30.0000 mg | ORAL_TABLET | Freq: Every day | ORAL | Status: DC
  Administered 2013-02-15 – 2013-02-16 (×2): 30 mg via ORAL
  Filled 2013-02-13 (×7): qty 1

## 2013-02-13 MED ORDER — NALOXONE HCL 0.4 MG/ML IJ SOLN: 0.4000 mg | Freq: Once | INTRAMUSCULAR | Status: DC

## 2013-02-13 MED ORDER — SODIUM CHLORIDE 0.9 % IV BOLUS (SEPSIS)
750.0000 mL | Freq: Once | INTRAVENOUS | Status: AC
  Administered 2013-02-13: 750 mL via INTRAVENOUS

## 2013-02-13 MED ORDER — MUPIROCIN 2 % EX OINT
1.0000 "application " | TOPICAL_OINTMENT | Freq: Two times a day (BID) | CUTANEOUS | Status: DC
  Administered 2013-02-13 – 2013-02-17 (×8): 1 via NASAL
  Filled 2013-02-13: qty 22

## 2013-02-13 MED ORDER — SODIUM CHLORIDE 0.9 % IV SOLN
80.0000 mg | Freq: Once | INTRAVENOUS | Status: AC
  Administered 2013-02-13: 80 mg via INTRAVENOUS
  Filled 2013-02-13: qty 80

## 2013-02-13 MED ORDER — CALCIUM ACETATE 667 MG PO CAPS
667.0000 mg | ORAL_CAPSULE | Freq: Three times a day (TID) | ORAL | Status: DC
  Administered 2013-02-15 – 2013-02-17 (×7): 667 mg via ORAL
  Filled 2013-02-13 (×14): qty 1

## 2013-02-13 MED ORDER — DOXERCALCIFEROL 4 MCG/2ML IV SOLN
2.0000 ug | INTRAVENOUS | Status: DC
  Administered 2013-02-15 – 2013-02-17 (×2): 2 ug via INTRAVENOUS
  Filled 2013-02-13 (×2): qty 2

## 2013-02-13 MED ORDER — POTASSIUM CHLORIDE 10 MEQ/50ML IV SOLN
10.0000 meq | INTRAVENOUS | Status: AC
  Administered 2013-02-13 (×2): 10 meq via INTRAVENOUS
  Filled 2013-02-13 (×2): qty 50

## 2013-02-13 MED ORDER — DONEPEZIL HCL 10 MG PO TABS
10.0000 mg | ORAL_TABLET | Freq: Every day | ORAL | Status: DC
  Administered 2013-02-13 – 2013-02-16 (×4): 10 mg via ORAL
  Filled 2013-02-13 (×6): qty 1

## 2013-02-13 MED ORDER — PANTOPRAZOLE SODIUM 40 MG IV SOLR: 40.0000 mg | Freq: Two times a day (BID) | INTRAVENOUS | Status: DC

## 2013-02-13 MED ORDER — NOREPINEPHRINE BITARTRATE 1 MG/ML IJ SOLN
1.0000 ug/min | INTRAVENOUS | Status: DC
  Filled 2013-02-13: qty 4

## 2013-02-13 MED ORDER — FLUOXETINE HCL 40 MG PO CAPS
40.0000 mg | ORAL_CAPSULE | Freq: Every morning | ORAL | Status: DC
  Administered 2013-02-14 – 2013-02-17 (×4): 40 mg via ORAL
  Filled 2013-02-13 (×6): qty 1

## 2013-02-13 MED ORDER — SODIUM CHLORIDE 0.9 % IV SOLN
8.0000 mg/h | INTRAVENOUS | Status: DC
  Administered 2013-02-13 – 2013-02-14 (×3): 8 mg/h via INTRAVENOUS
  Filled 2013-02-13 (×8): qty 80

## 2013-02-13 MED ORDER — ONDANSETRON HCL 4 MG/2ML IJ SOLN: 4.0000 mg | Freq: Three times a day (TID) | INTRAMUSCULAR | Status: AC | PRN

## 2013-02-13 NOTE — Progress Notes (Signed)
Asked to admit this patient by Dr. Zenia Resides  Patient is a 80 year old African American female with a very complicated past medical history which includes a left AKA approximately 5 days back at [facility] in [place]. She also has a past medical history of end-stage renal disease on hemodialysis Mondays/Wednesdays and Fridays. She also has severe peripheral vascular disease, diabetes, atrial fibrillation not on any anticoagulation, and diabetes. She apparently was discharged from [facility] on 1/8 after having a prolonged hospital stay for critical left lower extremity ischemia which required a left AKA. Review of discharge medications, shows that she was discharged on aspirin and Plavix. Patient was brought to the ED today, for an episode of lethargy/decreased responsiveness and subsequent syncope on she was having a large bloody bowel movement. Initially it was bloody and then subsequently became melanotic. Unfortunately, during the ED stay this evening, she started to become hypotensive in spite of 1.5 L fluid bolus. She is also in atrial fibrillation with rapid ventricular response. She's also had one further episode of a large melanotic stool while in the emergency room. I subsequently have asked ED M.D. to see if we can place a central line, as the patient has a very limited peripheral access. I'll also subsequently spoken with critical care at [facility] Dr. Jimmy Footman, was graciously accepted the patient as a transfer to the critical-care service. ED MD-Dr. Sabra Heck  informed. The hospitalist service will sign off. Family updated

## 2013-02-13 NOTE — ED Notes (Addendum)
Late entry:  Dressing for amputation site removed earlier when patient arrived by Dr. Vivi Martens.    Site cleansed with saline, Zeroform dressing reapplied as earlier, and elastic tube type cap placed back on wound with prosthesis type styrofoam attachment replaced.

## 2013-02-13 NOTE — Consult Note (Signed)
Referring Provider: PCCM; Dr. Sood Primary Care Physician:  VYAS,DHRUV B., MD Primary Gastroenterologist:  ? Dr. Rehman  Reason for Consultation:  Melena  HPI: Katrina Meyer is a 80 y.o. female w/ multiple co-morbidities including ESRD (MWF -received last HD 1/9), DM, severe PAD s/p recent left AKA, and dementia. She was admitted to [facility] on 1/9 w/ poor po intake, decreased LOC and melena. Had persistent hypotension in spite of 1.5 liters NS, initial hgb 11.2. PCCM asked to admit.  Apparently she was just discharged from a hospital in [place] earlier this week after AKA.  While at home she was having some black stools, according to reports.  This AM she had a stool that had more red color than black.  Hgb this AM was 9.0 grams.  She had a colonoscopy earlier this year (03/2102) by Dr. Rehman, which revealed the following:  "Six polyps snared including large pedunculated polyp over 30 mm in the sigmoid colon with ulcerated surface. She possibly bled from this polyp. Single Hemoclip applied to polypectomy site. Two small polyps coagulated as above.  Multiple diverticula at sigmoid colon."  There are reports that she had EGD at the same time as well, but I do not see that report.   She is on ASA at home.  Her nurse reports that family said she has experienced problems with bleeding while on ASA in the past.  Patient does not speak much to strangers apparently due to her dementia.  She does not say anything during out visit with her today.  Family was not present.  Had colon resection in 2006 (right hemicolectomy) due to a large, flat polyp that could not be removed endoscopically.   Past Medical History  Diagnosis Date  . Hyperlipidemia     takes Lipitor daily  . DVT (deep venous thrombosis)   . Stroke   . Parkinson's disease     takes Sinemet daily  . Tibia/fibula fracture 01/04/2012    Treated with long leg casting. Cast change in hospital 02/27/12   . Peripheral vascular disease  02/27/2012    Skin ulcer of right great toe  . Atrial fibrillation 03/09/2012    Seen on older EKG as well but family not aware.   . Macrocytic anemia 03/09/2012  . Lower GI bleed 03/10/2012    hematochezia secondary to ulcerated polyps; diverticulosis; chronic diarrhea  . End stage renal disease on dialysis     [facility]- MWF  . Hypertension     takes Coreg daily  . Dementia     takes Aricept daily  . Diabetes mellitus, type II     takes Lantus and Novolog  . Seasonal allergies     takes Allegra daily  . Depression     takes Prozac daily  . Hypothyroidism     takes Synthroid daily    Past Surgical History  Procedure Laterality Date  . Dg av dialysis graft declot or    . Av fistula repair  Aug. 2013    Left arm  . Cholecystectomy    . Colon surgery      Aprrox 2006 at [facility] after she had a colonoscopy  . Colonoscopy  03/10/2012    Procedure: COLONOSCOPY;  Surgeon: Najeeb U Rehman, MD;  [facility];  Service: Endoscopy;  Laterality: N/A;  . Esophagogastroduodenoscopy  03/10/2012    Procedure: ESOPHAGOGASTRODUODENOSCOPY (EGD);  Surgeon: Najeeb U Rehman, MD;  [facility];  Service: Endoscopy;  Laterality: N/A;  EGD if   TCS normal.  . Ligation of arteriovenous  fistula Left 03/24/2012    Procedure: LIGATION OF ARTERIOVENOUS  FISTULA;  Surgeon: Conrad Senecaville, MD;  Location: [place];  Service: Vascular;  Laterality: Left;  . Av fistula placement Right 04/28/2012    Procedure: INSERTION OF ARTERIOVENOUS (AV) GORE-TEX GRAFT ARM;  Surgeon: Elam Dutch, MD;  Location: [facility];  Service: Vascular;  Laterality: Right;  . Leg amputation above knee Left   . Angioplast of lle  12/2012-01/2013    Several Procedures    Prior to Admission medications   Medication Sig Start Date End Date Taking? Authorizing Provider  ASPIRIN PO Take 1 tablet by mouth every morning.   Yes Historical Provider, MD  atorvastatin (LIPITOR) 10 MG tablet Take 10 mg by mouth every morning.     Yes Historical Provider, MD  calcium acetate, Phos Binder, (PHOSLYRA) 667 MG/5ML SOLN Take 667-1,334 mg by mouth 3 (three) times daily with meals. Takes 67mls with snacks and 34mls with meals   Yes Historical Provider, MD  carbidopa-levodopa (SINEMET IR) 25-100 MG per tablet Take 1.5 tablets by mouth 3 (three) times daily.   Yes Historical Provider, MD  carvedilol (COREG) 12.5 MG tablet Take 12.5 mg by mouth 2 (two) times daily with a meal.   Yes Historical Provider, MD  cinacalcet (SENSIPAR) 30 MG tablet Take 30 mg by mouth every evening.    Yes Historical Provider, MD  donepezil (ARICEPT) 10 MG tablet Take 10 mg by mouth at bedtime.    Yes Historical Provider, MD  FLUoxetine (PROZAC) 40 MG capsule Take 40 mg by mouth every morning.    Yes Historical Provider, MD  insulin glargine (LANTUS) 100 UNIT/ML injection Inject 6 Units into the skin at bedtime.    Yes Historical Provider, MD  insulin NPH-regular (NOVOLIN 70/30) (70-30) 100 UNIT/ML injection Inject 1-5 Units into the skin 3 (three) times daily. Per sliding scale   Yes Historical Provider, MD  levothyroxine (SYNTHROID, LEVOTHROID) 25 MCG tablet Take 25 mcg by mouth every morning.   Yes Historical Provider, MD  Loratadine-Pseudoephedrine (CLARITIN-D 24 HOUR PO) Take 1 tablet by mouth at bedtime.    Yes Historical Provider, MD  multivitamin (RENA-VIT) TABS tablet Take 1 tablet by mouth 2 (two) times daily.    Yes Historical Provider, MD    Current Facility-Administered Medications  Medication Dose Route Frequency Provider Last Rate Last Dose  . 0.9 %  sodium chloride infusion   Intravenous Continuous Erick Colace, NP 75 mL/hr at 02/13/13 1015    . carbidopa-levodopa (SINEMET IR) 25-100 MG per tablet immediate release 1.5 tablet  1.5 tablet Oral TID Erick Colace, NP      . Chlorhexidine Gluconate Cloth 2 % PADS 6 each  6 each Topical Q0600 Chesley Mires, MD      . cinacalcet (SENSIPAR) tablet 30 mg  30 mg Oral QPM Erick Colace, NP      .  donepezil (ARICEPT) tablet 10 mg  10 mg Oral QHS Erick Colace, NP      . FLUoxetine (PROZAC) capsule 40 mg  40 mg Oral q morning - 10a Erick Colace, NP      . levothyroxine (SYNTHROID, LEVOTHROID) tablet 25 mcg  25 mcg Oral q morning - 10a Erick Colace, NP      . multivitamin (RENA-VIT) tablet 1 tablet  1 tablet Oral BID Erick Colace, NP      . mupirocin ointment (BACTROBAN) 2 % 1  application  1 application Nasal BID Chesley Mires, MD      . ondansetron [facility]) injection 4 mg  4 mg Intravenous Q8H PRN Johnna Acosta, MD      . pantoprazole (PROTONIX) 80 mg in sodium chloride 0.9 % 250 mL infusion  8 mg/hr Intravenous Continuous Jonetta Osgood, MD 25 mL/hr at 02/13/13 0321 8 mg/hr at 02/13/13 0321    Allergies as of 02/12/2013 - Review Complete 02/12/2013  Allergen Reaction Noted  . Hydralazine Rash 04/15/2012    Family History  Problem Relation Age of Onset  . Stroke Mother   . Diabetes Mother   . Cancer Mother   . Hypertension Mother   . Coronary artery disease Father   . Diabetes Sister     History   Social History  . Marital Status: Married    Spouse Name: N/A    Number of Children: N/A  . Years of Education: N/A   Occupational History  . Not on file.   Social History Main Topics  . Smoking status: Passive Smoke Exposure - Never Smoker    Types: Cigarettes  . Smokeless tobacco: Never Used  . Alcohol Use: No  . Drug Use: No  . Sexual Activity: No   Other Topics Concern  . Not on file   Social History Narrative  . No narrative on file    Review of Systems: Ten point ROS is O/W negative except as mentioned in HPI.  Physical Exam: Vital signs in last 24 hours: Temp:  [97.6 F (36.4 C)-98.1 F (36.7 C)] 97.6 F (36.4 C) (01/10 0753) Pulse Rate:  [25-124] 110 (01/10 0800) Resp:  [11-23] 13 (01/10 0800) BP: (51-147)/(17-101) 102/27 mmHg (01/10 0800) SpO2:  [49 %-100 %] 100 % (01/10 0800) Weight:  [124 lb 9 oz (56.501 kg)-129 lb 10.1 oz (58.8  kg)] 129 lb 10.1 oz (58.8 kg) (01/10 0454) Last BM Date: 02/13/13 General:  Alert, Chronically ill-appearing, in NAD. Head:  Normocephalic and atraumatic. Eyes:  Sclera clear, no icterus.   Conjunctiva pink. Ears:  Normal auditory acuity. Mouth:  No deformity or lesions.   Lungs:  Clear throughout to auscultation.  No wheezes, crackles, or rhonchi.  Heart:  Tachy.  Irregularly irregular. Abdomen:  Soft, non-distended.  BS present.  Non-tender. Rectal:  Deferred  Msk:  Symmetrical without gross deformities.  Left AKA. Pulses:  Normal pulses noted. Extremities:  Left AKA. Neurologic:  Alert non-verbal.  Non-focal. Skin:  Intact without significant lesions or rashes.. Lab Results:  Recent Labs  02/12/13 2224  WBC 19.7*  HGB 11.2*  HCT 36.3  PLT 414*   BMET  Recent Labs  02/12/13 2224  NA 140  K 3.7  CL 94*  CO2 27  GLUCOSE 161*  BUN 12  CREATININE 1.61*  CALCIUM 9.9   LFT  Recent Labs  02/12/13 2224  PROT 7.1  ALBUMIN 2.2*  AST 20  ALT <5  ALKPHOS 120*  BILITOT 0.5   PT/INR  Recent Labs  02/12/13 2224  LABPROT 16.9*  INR 1.41   Studies/Results: Ct Head Wo Contrast  02/13/2013   CLINICAL DATA:  Acute mental status changes. Recent below-knee amputation.  EXAM: CT HEAD WITHOUT CONTRAST  TECHNIQUE: Contiguous axial images were obtained from the base of the skull through the vertex without intravenous contrast.  COMPARISON:  MRI brain 06/04/2012. CT head 06/03/2012, 03/09/2012, 01/04/2012.  FINDINGS: Moderate to severe cortical and deep atrophy and moderate cerebellar atrophy, unchanged. Old stroke in the right inferior  cerebellar hemisphere, unchanged. Severe changes of small vessel disease of the white matter diffusely, unchanged. Old lacunar strokes in the left basal ganglia, unchanged. No mass lesion. No midline shift. No acute hemorrhage or hematoma. No extra-axial fluid collections. No evidence of acute infarction.  No focal osseous abnormality involving  the skull. Visualized paranasal sinuses, bilateral mastoid air cells, and bilateral middle ear cavities well-aerated. Extensive bilateral carotid siphon and bilateral vertebral artery atherosclerosis.  IMPRESSION: 1. No acute intracranial abnormality. 2. Stable moderate to severe generalized atrophy and severe chronic microvascular ischemic changes of the white matter. 3. Stable old right inferior cerebellar stroke. 4. Stable old lacunar strokes in the left basal ganglia.   Electronically Signed   By: Evangeline Dakin M.D.   On: 02/13/2013 07:23   Dg Chest Port 1 View  02/13/2013   CLINICAL DATA:  Central line placement.  EXAM: PORTABLE CHEST - 1 VIEW  COMPARISON:  Single view of the chest 06/03/2012.  FINDINGS: Right IJ catheter is in place with the tip projecting over the lower superior vena cava. No pneumothorax. Lungs are clear. Heart size is enlarged. No pleural effusion is identified.  IMPRESSION: Tip of right IJ catheter projects over the lower superior vena cava. No pneumothorax.  Cardiomegaly without edema.   Electronically Signed   By: Inge Rise M.D.   On: 02/13/2013 02:25    IMPRESSION:  -Melena:  Hgb 11.2 grams on admission, but 9.0 grams this AM.  Had colonoscopy earlier this year with several polyps removed.  Rule out ulcer disease, AVM, etc. -Recent AKA -Dementia -Multiple medical problems   PLAN: -Monitor Hgb. -Plan for EGD tomorrow, 1/11. -She is on PPI gtt, which can be continued for now until after EGD.   ZEHR, JESSICA D.  02/13/2013, 10:46 AM  Pager number [phone]   ________________________________________________________________________  Velora Heckler GI MD note:  I personally examined the patient, reviewed the data and agree with the assessment and plan described above.  Planning on EGD tomorrow AM.   Owens Loffler, MD [facility] Pager [phone]

## 2013-02-13 NOTE — ED Notes (Signed)
Fluid Bolus of 500 ml given for BP of 70  Positive response with BP up to 104/ prior to Transport

## 2013-02-13 NOTE — ED Provider Notes (Signed)
I was asked to see the patient by the hospitalist, the patient has inadequate IV access given her developing hypotension, tachycardia and relative instability hemodynamically. She has had ongoing GI bleeding, ongoing atrial fibrillation with a rapid ventricular rate and has only a 22-gauge Angiocath in her left upper extremity. Because of her dialysis status and need for IV access the peripheral access was limited and central access was requested.  Procedure Note:  Central Line Placement - Venous Catheter  I discussed this at length with the family members and the patient, the family member gave permission verbal and written consent, central line placement.  The patient was placed in the supine position, the right side of her neck was prepped and draped in a sterile fashion with chlorhexidine, sterile draping and ultrasound guidance was used -  and images were archived for recording purposes  1% lidocaine without epinephrine was used with 25-gauge needle to anesthetize the right side of the neck, 1 cc was used, 7.5 [place] triple lumen catheter was placed in the right internal jugular vein by Seldinger technique, sutured in place, all ports drew and flushed without difficulty, ultrasound confirmed intravenous placement.  Patient tolerated the procedure without difficulty, chest x-ray ordered to confirm placement, no hypoxia related to the event, no shortness of breath or chest pain.  The family was informed of the results, the patient will be transferred to [facility] to the ICU.  Johnna Acosta, MD 02/13/13 [phone]

## 2013-02-13 NOTE — H&P (Signed)
Name: Katrina Meyer MRN: [id number] DOB: July 26, 1932    ADMISSION DATE:  02/12/2013 CONSULTATION DATE:  1/9  REFERRING MD :  Zenia Resides PRIMARY SERVICE: PCCM   CHIEF COMPLAINT:  Hypotension and decreased MS   BRIEF PATIENT DESCRIPTION:  80 year old w/ multiple co-morbids: ESRD (MWF -received last HD 1/9), DM, severe PAD s/p recent left AKA, dementia, admitted to [facility] on 1/9 w/ poor po intake, decreased LOC and melena. Had persistent hypotension in spite of 1.5 liters NS, initial hgb 11.2. PCCM asked to admit.   SIGNIFICANT EVENTS: 1/10 Nephrology, GI consulted  STUDIES:  CT head 1/9: neg, Stable moderate to severe generalized atrophy and severe chronic microvascular ischemic changes of the white matter.  Stable old right inferior cerebellar stroke.  Stable old lacunar strokes in the left basal ganglia.   LINES / TUBES: Right IJ CVL 1/10>>>  CULTURES:   ANTIBIOTICS:   HISTORY OF PRESENT ILLNESS:   80 year old African American female w/ past medical history of end-stage renal disease on hemodialysis Mondays/Wednesdays and Fridays, severe peripheral vascular disease, diabetes, atrial fibrillation not on any anticoagulation, and diabetes. She apparently was discharged from [facility] on 1/8 after having a prolonged hospital stay for critical left lower extremity ischemia which required a left AKA. Review of discharge medications, shows that she was discharged on aspirin and Plavix (but plavix not on MAR). Presented to ED 1/9, w/cc: decreased PO intake, and increased lethargy/decreased responsiveness and subsequent syncope on she was having a large bloody bowel movement. Initially it was bloody and then subsequently became melanotic. While in ED stay she started to become hypotensive in spite of 1.5 L fluid bolus, this was c/b atrial fibrillation with rapid ventricular response. Followed by one further episode of a large melanotic stool while in the emergency room. She was initially seen by  hospitalist but given hypotension PCCM was asked to admit.    PAST MEDICAL HISTORY :  Past Medical History  Diagnosis Date  . Hyperlipidemia     takes Lipitor daily  . DVT (deep venous thrombosis)   . Stroke   . Parkinson's disease     takes Sinemet daily  . Tibia/fibula fracture 01/04/2012    Treated with long leg casting. Cast change in hospital 02/27/12   . Peripheral vascular disease 02/27/2012    Skin ulcer of right great toe  . Atrial fibrillation 03/09/2012    Seen on older EKG as well but family not aware.   . Macrocytic anemia 03/09/2012  . Lower GI bleed 03/10/2012    hematochezia secondary to ulcerated polyps; diverticulosis; chronic diarrhea  . End stage renal disease on dialysis     [facility]- MWF  . Hypertension     takes Coreg daily  . Dementia     takes Aricept daily  . Diabetes mellitus, type II     takes Lantus and Novolog  . Seasonal allergies     takes Allegra daily  . Depression     takes Prozac daily  . Hypothyroidism     takes Synthroid daily   Past Surgical History  Procedure Laterality Date  . Dg av dialysis graft declot or    . Av fistula repair  Aug. 2013    Left arm  . Cholecystectomy    . Colon surgery      Aprrox 2006 at [facility] after she had a colonoscopy  . Colonoscopy  03/10/2012    Procedure: COLONOSCOPY;  Surgeon: Rogene Houston, MD;  Location:  [facility];  Service: Endoscopy;  Laterality: N/A;  . Esophagogastroduodenoscopy  03/10/2012    Procedure: ESOPHAGOGASTRODUODENOSCOPY (EGD);  Surgeon: Rogene Houston, MD;  Location: [facility];  Service: Endoscopy;  Laterality: N/A;  EGD if TCS normal.  . Ligation of arteriovenous  fistula Left 03/24/2012    Procedure: LIGATION OF ARTERIOVENOUS  FISTULA;  Surgeon: Conrad Providence, MD;  Location: [place];  Service: Vascular;  Laterality: Left;  . Av fistula placement Right 04/28/2012    Procedure: INSERTION OF ARTERIOVENOUS (AV) GORE-TEX GRAFT ARM;  Surgeon: Elam Dutch, MD;  Location: [facility]  OR;  Service: Vascular;  Laterality: Right;  . Leg amputation above knee Left   . Angioplast of lle  12/2012-01/2013    Several Procedures   Prior to Admission medications   Medication Sig Start Date End Date Taking? Authorizing Provider  ASPIRIN PO Take 1 tablet by mouth every morning.   Yes Historical Provider, MD  atorvastatin (LIPITOR) 10 MG tablet Take 10 mg by mouth every morning.    Yes Historical Provider, MD  calcium acetate, Phos Binder, (PHOSLYRA) 667 MG/5ML SOLN Take 667-1,334 mg by mouth 3 (three) times daily with meals. Takes 66mls with snacks and 71mls with meals   Yes Historical Provider, MD  carbidopa-levodopa (SINEMET IR) 25-100 MG per tablet Take 1.5 tablets by mouth 3 (three) times daily.   Yes Historical Provider, MD  carvedilol (COREG) 12.5 MG tablet Take 12.5 mg by mouth 2 (two) times daily with a meal.   Yes Historical Provider, MD  cinacalcet (SENSIPAR) 30 MG tablet Take 30 mg by mouth every evening.    Yes Historical Provider, MD  donepezil (ARICEPT) 10 MG tablet Take 10 mg by mouth at bedtime.    Yes Historical Provider, MD  FLUoxetine (PROZAC) 40 MG capsule Take 40 mg by mouth every morning.    Yes Historical Provider, MD  insulin glargine (LANTUS) 100 UNIT/ML injection Inject 6 Units into the skin at bedtime.    Yes Historical Provider, MD  insulin NPH-regular (NOVOLIN 70/30) (70-30) 100 UNIT/ML injection Inject 1-5 Units into the skin 3 (three) times daily. Per sliding scale   Yes Historical Provider, MD  levothyroxine (SYNTHROID, LEVOTHROID) 25 MCG tablet Take 25 mcg by mouth every morning.   Yes Historical Provider, MD  Loratadine-Pseudoephedrine (CLARITIN-D 24 HOUR PO) Take 1 tablet by mouth at bedtime.    Yes Historical Provider, MD  multivitamin (RENA-VIT) TABS tablet Take 1 tablet by mouth 2 (two) times daily.    Yes Historical Provider, MD   Allergies  Allergen Reactions  . Hydralazine Rash    FAMILY HISTORY:  Family History  Problem Relation Age of  Onset  . Stroke Mother   . Diabetes Mother   . Cancer Mother   . Hypertension Mother   . Coronary artery disease Father   . Diabetes Sister    SOCIAL HISTORY:  reports that she has been passively smoking Cigarettes.  She has been smoking about 0.00 packs per day. She has never used smokeless tobacco. She reports that she does not drink alcohol or use illicit drugs.  REVIEW OF SYSTEMS:  Unable   SUBJECTIVE:  Awake, not in distress.   VITAL SIGNS: Temp:  [97.6 F (36.4 C)-98.1 F (36.7 C)] 97.6 F (36.4 C) (01/10 0753) Pulse Rate:  [25-124] 110 (01/10 0800) Resp:  [11-23] 13 (01/10 0800) BP: (51-147)/(17-101) 102/27 mmHg (01/10 0800) SpO2:  [49 %-100 %] 100 % (01/10 0800) Weight:  [56.501 kg (124  lb 9 oz)-58.8 kg (129 lb 10.1 oz)] 58.8 kg (129 lb 10.1 oz) (01/10 0454) HEMODYNAMICS:   VENTILATOR SETTINGS:   INTAKE / OUTPUT: Intake/Output     01/09 0701 - 01/10 0700 01/10 0701 - 01/11 0700   I.V. (mL/kg) 366.3 (6.2) 125 (2.1)   Total Intake(mL/kg) 366.3 (6.2) 125 (2.1)   Net +366.3 +125        Stool Occurrence 1 x      PHYSICAL EXAMINATION: General:  Awake, in no distress. Not following commands  Neuro:  Awake, non-vocal, groans/moans, moves all ext HEENT:  Poor dentition no JVD Cardiovascular:  Tachy irreg irreg + systolic murmur, R AVG, w/ good bruit, thrill weak but good pulse  Lungs:  Clear and w/out accessory muscle use  Abdomen:  Non-tender  Musculoskeletal:  Left AKA, dressing and immobilizer in-place. Her staples are intact. No erythema or drainage   LABS:  CBC  Recent Labs Lab 02/12/13 2224  WBC 19.7*  HGB 11.2*  HCT 36.3  PLT 414*   Coag's  Recent Labs Lab 02/12/13 2224  APTT 48*  INR 1.41   BMET  Recent Labs Lab 02/12/13 2224  NA 140  K 3.7  CL 94*  CO2 27  BUN 12  CREATININE 1.61*  GLUCOSE 161*   Electrolytes  Recent Labs Lab 02/12/13 2224  CALCIUM 9.9   Sepsis Markers  Recent Labs Lab 02/12/13 2225  LATICACIDVEN  3.3*   ABG No results found for this basename: PHART, PCO2ART, PO2ART,  in the last 168 hours Liver Enzymes  Recent Labs Lab 02/12/13 2224  AST 20  ALT <5  ALKPHOS 120*  BILITOT 0.5  ALBUMIN 2.2*   Cardiac Enzymes No results found for this basename: TROPONINI, PROBNP,  in the last 168 hours Glucose  Recent Labs Lab 02/13/13 0436 02/13/13 0752  GLUCAP 121* 96    Imaging Ct Head Wo Contrast  02/13/2013   CLINICAL DATA:  Acute mental status changes. Recent below-knee amputation.  EXAM: CT HEAD WITHOUT CONTRAST  TECHNIQUE: Contiguous axial images were obtained from the base of the skull through the vertex without intravenous contrast.  COMPARISON:  MRI brain 06/04/2012. CT head 06/03/2012, 03/09/2012, 01/04/2012.  FINDINGS: Moderate to severe cortical and deep atrophy and moderate cerebellar atrophy, unchanged. Old stroke in the right inferior cerebellar hemisphere, unchanged. Severe changes of small vessel disease of the white matter diffusely, unchanged. Old lacunar strokes in the left basal ganglia, unchanged. No mass lesion. No midline shift. No acute hemorrhage or hematoma. No extra-axial fluid collections. No evidence of acute infarction.  No focal osseous abnormality involving the skull. Visualized paranasal sinuses, bilateral mastoid air cells, and bilateral middle ear cavities well-aerated. Extensive bilateral carotid siphon and bilateral vertebral artery atherosclerosis.  IMPRESSION: 1. No acute intracranial abnormality. 2. Stable moderate to severe generalized atrophy and severe chronic microvascular ischemic changes of the white matter. 3. Stable old right inferior cerebellar stroke. 4. Stable old lacunar strokes in the left basal ganglia.   Electronically Signed   By: Evangeline Dakin M.D.   On: 02/13/2013 07:23   Dg Chest Port 1 View  02/13/2013   CLINICAL DATA:  Central line placement.  EXAM: PORTABLE CHEST - 1 VIEW  COMPARISON:  Single view of the chest 06/03/2012.   FINDINGS: Right IJ catheter is in place with the tip projecting over the lower superior vena cava. No pneumothorax. Lungs are clear. Heart size is enlarged. No pleural effusion is identified.  IMPRESSION: Tip of right IJ catheter  projects over the lower superior vena cava. No pneumothorax.  Cardiomegaly without edema.   Electronically Signed   By: Inge Rise M.D.   On: 02/13/2013 02:25     [id number] clear and w/out airspace disease.   ASSESSMENT / PLAN:  PULMONARY A:  No acute issue  P:   Pulse ox Monitor for evidence of volume overload  O2 as needed   CARDIOVASCULAR A:  Hypovolemic shock, presume mix of hemorrhagic and hypovolemia after HD >UGIB, on asa and plavix.  AF w/ RVR. HR still 120-130s, suspect that this reflects under-resuscitation  Recent Left AKA, staples intact. Surgical site CD&I P:  Hold asa and plavix transduce CVP, repeat lactic acid and cbc Continue current IVF, if has sig hgb drift will transfuse  If needs pressors would favor neo in setting of RVR  Dry sterile dressing daily   RENAL A:   ESRD, last HD 1/9 P:   Repeat chemistry Renal consult, currently no emergent need for HD   GASTROINTESTINAL A:   Probable UGIB  >of not also has h/o LIGB w/ polyps P:   ppi gtt  will hold aspirin and plavix.  For EGD 1/11   HEMATOLOGIC A:   Acute on chronic anemia. Think current hgb which is above baseline actually represents hemoconcentration and suspect f/u hgb to be lower.  H/o macrocytic anemia  P:  Hold asa and plavix Repeat CBC  Consider transfuse if hgb drops and hypotensive   INFECTIOUS A:   No evidence of infection  P:   Trend cbc Ck bcx2 if spikes temp  ENDOCRINE A:   H/o DM H/o hypothyroid  P:   Trend cbg while NPO Start ssi if  Continue synthroid   NEUROLOGIC A:   Dementia (currently at baseline) parkinsons dz P:   Continue sinemet   Derm A: Right foot venous stasis ulcer 1X2cm sacral ulcer w/ dressing in place.    P: Dressing per protocol   TODAY'S SUMMARY: think mix of UGIB and hypovolemia after HD. Need to resuscitate. Get GI and renal involvement. Would transfuse of hgb drift.   Reviewed above, examined pt, and agree with assessment/plan.  She has upper GI bleed with hemorrhagic shock.  Appreciate assistance from GI and nephrology.  Plan for EGD 1/11.  Updated family at bedside.  CC time 35 minutes.  Pulmonary and [place] Pager: [phone]  02/13/2013, 9:14 AM

## 2013-02-13 NOTE — Consult Note (Signed)
[facility] Renal Consultation NoteaIndication for Consultation:  Management of ESRD/hemodialysis; anemia, hypertension/volume and secondary hyperparathyroidism  HPI: Katrina Meyer is a 80 y.o. female admitted with to [facility] yesterday with reported poor po intake, decreased LOC and melena at home.per family.She had persistent hypotension in spite of 1.5 liters NS, initial hgb 11.2. PCCM admitted her .Reported to have been  discharged from [facility] in  [place] earlier this week after L  AKA. She went to her normal OP hemodialysis yesterday MWF ( [facility]) and had no co and no reported complications other than  below her Prior EDW 65 at 57.8 pre hd and 56.5  Kg postwt  with  bp 145/60 pre hd and 111/37 post hd using tight heparin on hd and No [facility]  [facility] . She has ho of CVA/Parkinsons/Depression and little po interaction at the op unit. Now no family in room and no voiced response from pt. but opens eyes and smiles when spoken to.NO history available from Pt. Has known ho [facility] 2/3- 03/12/2012 Diverticular bleed, polypectomy with hemoclip / Dr. Laural Golden Rx then and Isolated A. Fib converted back to SR.       Past Medical History  Diagnosis Date  . Hyperlipidemia     takes Lipitor daily  . DVT (deep venous thrombosis)   . Stroke   . Parkinson's disease     takes Sinemet daily  . Tibia/fibula fracture 01/04/2012    Treated with long leg casting. Cast change in hospital 02/27/12   . Peripheral vascular disease 02/27/2012    Skin ulcer of right great toe  . Atrial fibrillation 03/09/2012    Seen on older EKG as well but family not aware.   . Macrocytic anemia 03/09/2012  . Lower GI bleed 03/10/2012    hematochezia secondary to ulcerated polyps; diverticulosis; chronic diarrhea  . End stage renal disease on dialysis     [facility]- MWF  . Hypertension     takes Coreg daily  . Dementia     takes Aricept daily  . Diabetes  mellitus, type II     takes Lantus and Novolog  . Seasonal allergies     takes Allegra daily  . Depression     takes Prozac daily  . Hypothyroidism     takes Synthroid daily    Past Surgical History  Procedure Laterality Date  . Dg av dialysis graft declot or    . Av fistula repair  Aug. 2013    Left arm  . Cholecystectomy    . Colon surgery      Aprrox 2006 at [facility] after she had a colonoscopy  . Colonoscopy  03/10/2012    Procedure: COLONOSCOPY;  Surgeon: Rogene Houston, MD;  Location: [facility];  Service: Endoscopy;  Laterality: N/A;  . Esophagogastroduodenoscopy  03/10/2012    Procedure: ESOPHAGOGASTRODUODENOSCOPY (EGD);  Surgeon: Rogene Houston, MD;  Location: [facility];  Service: Endoscopy;  Laterality: N/A;  EGD if TCS normal.  . Ligation of arteriovenous  fistula Left 03/24/2012    Procedure: LIGATION OF ARTERIOVENOUS  FISTULA;  Surgeon: Conrad New Whiteland, MD;  Location: [place];  Service: Vascular;  Laterality: Left;  . Av fistula placement Right 04/28/2012    Procedure: INSERTION OF ARTERIOVENOUS (AV) GORE-TEX GRAFT ARM;  Surgeon: Elam Dutch, MD;  Location: [facility] OR;  Service: Vascular;  Laterality: Right;  . Leg amputation above knee Left   .  Angioplast of lle  12/2012-01/2013    Several Procedures      Family History  Problem Relation Age of Onset  . Stroke Mother   . Diabetes Mother   . Cancer Mother   . Hypertension Mother   . Coronary artery disease Father   . Diabetes Sister       reports that she has been passively smoking Cigarettes.  She has been smoking about 0.00 packs per day. She has never used smokeless tobacco. She reports that she does not drink alcohol or use illicit drugs.   Allergies  Allergen Reactions  . Hydralazine Rash    Prior to Admission medications   Medication Sig Start Date End Date Taking? Authorizing Provider  ASPIRIN PO Take 1 tablet by mouth every morning.   Yes Historical Provider, MD  atorvastatin (LIPITOR) 10 MG  tablet Take 10 mg by mouth every morning.    Yes Historical Provider, MD  calcium acetate, Phos Binder, (PHOSLYRA) 667 MG/5ML SOLN Take 667-1,334 mg by mouth 3 (three) times daily with meals. Takes 69ms with snacks and 168m with meals   Yes Historical Provider, MD  carbidopa-levodopa (SINEMET IR) 25-100 MG per tablet Take 1.5 tablets by mouth 3 (three) times daily.   Yes Historical Provider, MD  carvedilol (COREG) 12.5 MG tablet Take 12.5 mg by mouth 2 (two) times daily with a meal.   Yes Historical Provider, MD  cinacalcet (SENSIPAR) 30 MG tablet Take 30 mg by mouth every evening.    Yes Historical Provider, MD  donepezil (ARICEPT) 10 MG tablet Take 10 mg by mouth at bedtime.    Yes Historical Provider, MD  FLUoxetine (PROZAC) 40 MG capsule Take 40 mg by mouth every morning.    Yes Historical Provider, MD  insulin glargine (LANTUS) 100 UNIT/ML injection Inject 6 Units into the skin at bedtime.    Yes Historical Provider, MD  insulin NPH-regular (NOVOLIN 70/30) (70-30) 100 UNIT/ML injection Inject 1-5 Units into the skin 3 (three) times daily. Per sliding scale   Yes Historical Provider, MD  levothyroxine (SYNTHROID, LEVOTHROID) 25 MCG tablet Take 25 mcg by mouth every morning.   Yes Historical Provider, MD  Loratadine-Pseudoephedrine (CLARITIN-D 24 HOUR PO) Take 1 tablet by mouth at bedtime.    Yes Historical Provider, MD  multivitamin (RENA-VIT) TABS tablet Take 1 tablet by mouth 2 (two) times daily.    Yes Historical Provider, MD    PREXH:BZJIRCVELFYZMaryland Specialty Surgery Center LLCIV  Results for orders placed during the hospital encounter of 02/12/13 (from the past 48 hour(s))  TYPE AND SCREEN     Status: None   Collection Time    02/12/13 10:24 PM      Result Value Range   ABO/RH(D) O POS     Antibody Screen NEG     Sample Expiration 02/15/2013    CBC WITH DIFFERENTIAL     Status: Abnormal   Collection Time    02/12/13 10:24 PM      Result Value Range   WBC 19.7 (*) 4.0 - 10.5 K/uL   RBC 3.63 (*) 3.87 -  5.11 MIL/uL   Hemoglobin 11.2 (*) 12.0 - 15.0 g/dL   HCT 36.3  36.0 - 46.0 %   MCV 100.0  78.0 - 100.0 fL   MCH 30.9  26.0 - 34.0 pg   MCHC 30.9  30.0 - 36.0 g/dL   RDW 17.1 (*) 11.5 - 15.5 %   Platelets 414 (*) 150 - 400 K/uL   Neutrophils Relative % 87 (*)  43 - 77 %   Neutro Abs 17.1 (*) 1.7 - 7.7 K/uL   Lymphocytes Relative 6 (*) 12 - 46 %   Lymphs Abs 1.1  0.7 - 4.0 K/uL   Monocytes Relative 7  3 - 12 %   Monocytes Absolute 1.3 (*) 0.1 - 1.0 K/uL   Eosinophils Relative 0  0 - 5 %   Eosinophils Absolute 0.1  0.0 - 0.7 K/uL   Basophils Relative 0  0 - 1 %   Basophils Absolute 0.1  0.0 - 0.1 K/uL  COMPREHENSIVE METABOLIC PANEL     Status: Abnormal   Collection Time    02/12/13 10:24 PM      Result Value Range   Sodium 140  137 - 147 mEq/L   Potassium 3.7  3.7 - 5.3 mEq/L   Chloride 94 (*) 96 - 112 mEq/L   CO2 27  19 - 32 mEq/L   Glucose, Bld 161 (*) 70 - 99 mg/dL   BUN 12  6 - 23 mg/dL   Creatinine, Ser 1.61 (*) 0.50 - 1.10 mg/dL   Calcium 9.9  8.4 - 10.5 mg/dL   Total Protein 7.1  6.0 - 8.3 g/dL   Albumin 2.2 (*) 3.5 - 5.2 g/dL   AST 20  0 - 37 U/L   ALT <5  0 - 35 U/L   Alkaline Phosphatase 120 (*) 39 - 117 U/L   Total Bilirubin 0.5  0.3 - 1.2 mg/dL   GFR calc non Af Amer 30 (*) >90 mL/min   GFR calc Af Amer 34 (*) >90 mL/min   Comment: (NOTE)     The eGFR has been calculated using the CKD EPI equation.     This calculation has not been validated in all clinical situations.     eGFR's persistently <90 mL/min signify possible Chronic Kidney     Disease.  PROTIME-INR     Status: Abnormal   Collection Time    02/12/13 10:24 PM      Result Value Range   Prothrombin Time 16.9 (*) 11.6 - 15.2 seconds   INR 1.41  0.00 - 1.49  APTT     Status: Abnormal   Collection Time    02/12/13 10:24 PM      Result Value Range   aPTT 48 (*) 24 - 37 seconds   Comment:            IF BASELINE aPTT IS ELEVATED,     SUGGEST PATIENT RISK ASSESSMENT     BE USED TO DETERMINE  APPROPRIATE     ANTICOAGULANT THERAPY.  LACTIC ACID, PLASMA     Status: Abnormal   Collection Time    02/12/13 10:25 PM      Result Value Range   Lactic Acid, Venous 3.3 (*) 0.5 - 2.2 mmol/L  GLUCOSE, CAPILLARY     Status: Abnormal   Collection Time    02/13/13  4:36 AM      Result Value Range   Glucose-Capillary 121 (*) 70 - 99 mg/dL   Comment 1 Documented in Chart     Comment 2 Notify RN    MRSA PCR SCREENING     Status: Abnormal   Collection Time    02/13/13  5:37 AM      Result Value Range   MRSA by PCR POSITIVE (*) NEGATIVE   Comment:            The GeneXpert MRSA Assay (FDA     approved for  NASAL specimens     only), is one component of a     comprehensive MRSA colonization     surveillance program. It is not     intended to diagnose MRSA     infection nor to guide or     monitor treatment for     MRSA infections.     RESULT CALLED TO, READ BACK BY AND VERIFIED WITH:     CALLED TO RN DON HOVANDER [id number] _[id number]  THANEY  GLUCOSE, CAPILLARY     Status: None   Collection Time    02/13/13  7:52 AM      Result Value Range   Glucose-Capillary 96  70 - 99 mg/dL  LACTIC ACID, PLASMA     Status: None   Collection Time    02/13/13 10:11 AM      Result Value Range   Lactic Acid, Venous 0.8  0.5 - 2.2 mmol/L  PROCALCITONIN     Status: None   Collection Time    02/13/13 10:11 AM      Result Value Range   Procalcitonin 4.96     Comment:            Interpretation:     PCT > 2 ng/mL:     Systemic infection (sepsis) is likely,     unless other causes are known.     (NOTE)             ICU PCT Algorithm               Non ICU PCT Algorithm        ----------------------------     ------------------------------             PCT < 0.25 ng/mL                 PCT < 0.1 ng/mL         Stopping of antibiotics            Stopping of antibiotics           strongly encouraged.               strongly encouraged.        ----------------------------     ------------------------------            PCT level decrease by               PCT < 0.25 ng/mL           >= 80% from peak PCT           OR PCT 0.25 - 0.5 ng/mL          Stopping of antibiotics                                                 encouraged.         Stopping of antibiotics               encouraged.        ----------------------------     ------------------------------           PCT level decrease by              PCT >= 0.25 ng/mL           < 80% from peak PCT  AND PCT >= 0.5 ng/mL            Continuing antibiotics                                                  encouraged.           Continuing antibiotics                encouraged.        ----------------------------     ------------------------------         PCT level increase compared          PCT > 0.5 ng/mL             with peak PCT AND              PCT >= 0.5 ng/mL             Escalation of antibiotics                                              strongly encouraged.          Escalation of antibiotics            strongly encouraged.  COMPREHENSIVE METABOLIC PANEL     Status: Abnormal   Collection Time    02/13/13 10:12 AM      Result Value Range   Sodium 143  137 - 147 mEq/L   Potassium 3.3 (*) 3.7 - 5.3 mEq/L   Chloride 103  96 - 112 mEq/L   CO2 25  19 - 32 mEq/L   Glucose, Bld 104 (*) 70 - 99 mg/dL   BUN 16  6 - 23 mg/dL   Creatinine, Ser 2.09 (*) 0.50 - 1.10 mg/dL   Calcium 8.8  8.4 - 10.5 mg/dL   Total Protein 5.3 (*) 6.0 - 8.3 g/dL   Albumin 1.7 (*) 3.5 - 5.2 g/dL   AST 12  0 - 37 U/L   ALT <5  0 - 35 U/L   Alkaline Phosphatase 90  39 - 117 U/L   Total Bilirubin 0.2 (*) 0.3 - 1.2 mg/dL   GFR calc non Af Amer 22 (*) >90 mL/min   GFR calc Af Amer 25 (*) >90 mL/min   Comment: (NOTE)     The eGFR has been calculated using the CKD EPI equation.     This calculation has not been validated in all clinical situations.     eGFR's persistently <90 mL/min signify possible Chronic Kidney     Disease.  MAGNESIUM     Status: None    Collection Time    02/13/13 10:12 AM      Result Value Range   Magnesium 1.8  1.5 - 2.5 mg/dL  PHOSPHORUS     Status: None   Collection Time    02/13/13 10:12 AM      Result Value Range   Phosphorus 4.3  2.3 - 4.6 mg/dL  LIPASE, BLOOD     Status: None   Collection Time    02/13/13 10:12 AM      Result Value Range   Lipase 15  11 - 59 U/L  CBC     Status: Abnormal  Collection Time    02/13/13 10:12 AM      Result Value Range   WBC 17.2 (*) 4.0 - 10.5 K/uL   RBC 2.98 (*) 3.87 - 5.11 MIL/uL   Hemoglobin 9.0 (*) 12.0 - 15.0 g/dL   HCT 29.4 (*) 36.0 - 46.0 %   MCV 98.7  78.0 - 100.0 fL   MCH 30.2  26.0 - 34.0 pg   MCHC 30.6  30.0 - 36.0 g/dL   RDW 17.1 (*) 11.5 - 15.5 %   Platelets 342  150 - 400 K/uL  APTT     Status: Abnormal   Collection Time    02/13/13 10:12 AM      Result Value Range   aPTT 52 (*) 24 - 37 seconds   Comment:            IF BASELINE aPTT IS ELEVATED,     SUGGEST PATIENT RISK ASSESSMENT     BE USED TO DETERMINE APPROPRIATE     ANTICOAGULANT THERAPY.  PROTIME-INR     Status: Abnormal   Collection Time    02/13/13 10:12 AM      Result Value Range   Prothrombin Time 18.0 (*) 11.6 - 15.2 seconds   INR 1.53 (*) 0.00 - 1.49  .  ROS: No ho available from pt and no family in rm see HPi  Physical Exam: Filed Vitals:   02/13/13 1100  BP: 100/70  Pulse: 111  Temp:   Resp: 14     General:Elderly thin chronically ill appearing BF awoken from sleep.NAD, follows me  With eyes  But no voiced interaction HEENT: [place]. mmm Eyes: nonicteruc Neck: NO jvd Heart: Tach irrig, irreg 109 rate, no rub  , soft 1/6 sem  Lungs: Faint exp rhonchi  R Abdomen: BS pos. Soft , nt nd Extremities: L AKA Hard brace on not removed/ R no pedal edema Skin:  No overt rash , R dorsal foot chronic dry ulcer Neuro: nonverbal  And no changes from her normal ms or acute deficits ( That I know) Dialysis Access: Pos. Bruit R UA AVgg  Dialysis Orders: Center: [facility]   on MWF . EDW was 65  before AKA 9 yest post. Hd 56.5 kg  HD Bath 2.0 k, 2.25ca  Time 3hrs 93mn Heparin 2000. Access [id number] BFR 300 DFR 800    Hectorol 2 mcg IV/HD Epogen 4800   Units IV/HD  Venofer  100 mg  Needs 9 doses   Assessment/Plan 1. Hypotension  - Due to ? Gi bleed/ Infec? Sp Aka/ CCM rx 2. ESRD= HD MWF K3.3/ fu levels/ no hep on hd 3. Lower GI Bleed= GI seeing 4. Anemia  - Hgb  9.0 this am  Vs 11.2 admit   [facility] 5. Metabolic bone disease -  Vit d on hd / binders when pos 6. PVD sp recent L AKA 7. A. fib  DErnest Haber PA-C [id number]/11/2013, 11:48 AM  I have seen and examined this patient and agree with plan per DErnest Haber [id number] with ESRD on HD [place].  [facility]  Had HD yest and admitted this AM hypotension and Melena.  Labs OK except Hg down to 9.  BP slightly better with IV fluids.  PO intake has been poor according to caretaker.  Will plan HD again on Mon.  Will order her aranesp and Vit D.  ?  If this is related to narcotic pain meds.  Will give a trial of narcan. Fiza Nation T,MD 02/13/2013 12:59 PM

## 2013-02-13 NOTE — ED Notes (Signed)
MD in to examine.  Patient has mod amt of dark melena stool in incontinent pad - Discussed patient condition with MD and bed request will change to stepdown

## 2013-02-13 NOTE — ED Notes (Signed)
Prior to procedure for central line placement:   Patient turned and cleansed of bloody stool, noted sacrum including both sides of buttocks is errythematous, sloughing of skin with one area above the coccyx to be deeper - this area 2 x 2.5 cm.   Protective barrier cream applied and incontinent diaper placed.

## 2013-02-14 ENCOUNTER — Inpatient Hospital Stay (HOSPITAL_COMMUNITY): Payer: Medicare Other

## 2013-02-14 ENCOUNTER — Encounter (HOSPITAL_COMMUNITY): Payer: Self-pay

## 2013-02-14 ENCOUNTER — Encounter (HOSPITAL_COMMUNITY)
Admission: EM | Disposition: A | Discharge status: Home Health | Payer: Medicare Other | Source: Home / Self Care | Attending: Internal Medicine

## 2013-02-14 DIAGNOSIS — K2990 Gastroduodenitis, unspecified, without bleeding: Secondary | ICD-10-CM

## 2013-02-14 DIAGNOSIS — K449 Diaphragmatic hernia without obstruction or gangrene: Secondary | ICD-10-CM

## 2013-02-14 DIAGNOSIS — K2970 Gastritis, unspecified, without bleeding: Secondary | ICD-10-CM

## 2013-02-14 HISTORY — PX: ESOPHAGOGASTRODUODENOSCOPY: SHX5428

## 2013-02-14 LAB — CBC
HCT: 26.9 % — ABNORMAL LOW (ref 36.0–46.0)
HCT: 25.1 % — ABNORMAL LOW (ref 36.0–46.0)
HEMATOCRIT: 26.4 % — AB (ref 36.0–46.0)
HEMOGLOBIN: 8.1 g/dL — AB (ref 12.0–15.0)
Hemoglobin: 8.3 g/dL — ABNORMAL LOW (ref 12.0–15.0)
Hemoglobin: 7.6 g/dL — ABNORMAL LOW (ref 12.0–15.0)
MCH: 30.5 pg (ref 26.0–34.0)
MCH: 30.7 pg (ref 26.0–34.0)
MCH: 30.5 pg (ref 26.0–34.0)
MCHC: 30.9 g/dL (ref 30.0–36.0)
MCHC: 30.7 g/dL (ref 30.0–36.0)
MCHC: 30.3 g/dL (ref 30.0–36.0)
MCV: 98.9 fL (ref 78.0–100.0)
MCV: 100 fL (ref 78.0–100.0)
MCV: 100.8 fL — AB (ref 78.0–100.0)
PLATELETS: 344 10*3/uL (ref 150–400)
Platelets: 322 10*3/uL (ref 150–400)
Platelets: 302 10*3/uL (ref 150–400)
RBC: 2.72 MIL/uL — AB (ref 3.87–5.11)
RBC: 2.64 MIL/uL — ABNORMAL LOW (ref 3.87–5.11)
RBC: 2.49 MIL/uL — AB (ref 3.87–5.11)
RDW: 17.3 % — AB (ref 11.5–15.5)
RDW: 17.6 % — ABNORMAL HIGH (ref 11.5–15.5)
RDW: 17.7 % — ABNORMAL HIGH (ref 11.5–15.5)
WBC: 14.7 10*3/uL — AB (ref 4.0–10.5)
WBC: 15.4 10*3/uL — AB (ref 4.0–10.5)
WBC: 11.5 10*3/uL — AB (ref 4.0–10.5)

## 2013-02-14 LAB — GLUCOSE, CAPILLARY
GLUCOSE-CAPILLARY: 125 mg/dL — AB (ref 70–99)
GLUCOSE-CAPILLARY: 127 mg/dL — AB (ref 70–99)
GLUCOSE-CAPILLARY: 218 mg/dL — AB (ref 70–99)
Glucose-Capillary: 102 mg/dL — ABNORMAL HIGH (ref 70–99)
Glucose-Capillary: 135 mg/dL — ABNORMAL HIGH (ref 70–99)
Glucose-Capillary: 148 mg/dL — ABNORMAL HIGH (ref 70–99)
Glucose-Capillary: 163 mg/dL — ABNORMAL HIGH (ref 70–99)

## 2013-02-14 LAB — BASIC METABOLIC PANEL
BUN: 18 mg/dL (ref 6–23)
CO2: 21 mEq/L (ref 19–32)
Calcium: 8.9 mg/dL (ref 8.4–10.5)
Chloride: 107 mEq/L (ref 96–112)
Creatinine, Ser: 2.51 mg/dL — ABNORMAL HIGH (ref 0.50–1.10)
GFR, EST AFRICAN AMERICAN: 20 mL/min — AB (ref >90)
GFR, EST NON AFRICAN AMERICAN: 17 mL/min — AB (ref >90)
Glucose, Bld: 163 mg/dL — ABNORMAL HIGH (ref 70–99)
POTASSIUM: 3.6 meq/L — AB (ref 3.7–5.3)
SODIUM: 141 meq/L (ref 137–147)

## 2013-02-14 SURGERY — EGD (ESOPHAGOGASTRODUODENOSCOPY)
Anesthesia: Moderate Sedation

## 2013-02-14 MED ORDER — HYDROCODONE-ACETAMINOPHEN 7.5-325 MG PO TABS
1.0000 | ORAL_TABLET | Freq: Four times a day (QID) | ORAL | Status: DC | PRN
  Administered 2013-02-14 – 2013-02-15 (×2): 1 via ORAL
  Filled 2013-02-14 (×2): qty 1

## 2013-02-14 MED ORDER — BUTAMBEN-TETRACAINE-BENZOCAINE 2-2-14 % EX AERO
INHALATION_SPRAY | CUTANEOUS | Status: DC | PRN
  Administered 2013-02-14: 2 via TOPICAL

## 2013-02-14 MED ORDER — MIDAZOLAM HCL 10 MG/2ML IJ SOLN
INTRAMUSCULAR | Status: DC | PRN
  Administered 2013-02-14: 1 mg via INTRAVENOUS

## 2013-02-14 MED ORDER — SODIUM CHLORIDE 0.9 % IJ SOLN
10.0000 mL | INTRAMUSCULAR | Status: DC | PRN
  Administered 2013-02-15 (×3): 10 mL
  Administered 2013-02-15: 30 mL
  Administered 2013-02-15: 40 mL
  Administered 2013-02-16: 30 mL

## 2013-02-14 MED ORDER — PANTOPRAZOLE SODIUM 40 MG PO TBEC
40.0000 mg | DELAYED_RELEASE_TABLET | Freq: Two times a day (BID) | ORAL | Status: DC
  Administered 2013-02-14 – 2013-02-17 (×6): 40 mg via ORAL
  Filled 2013-02-14 (×6): qty 1

## 2013-02-14 MED ORDER — MIDAZOLAM HCL 5 MG/ML IJ SOLN
INTRAMUSCULAR | Status: AC
  Filled 2013-02-14: qty 1

## 2013-02-14 MED ORDER — FENTANYL CITRATE 0.05 MG/ML IJ SOLN
INTRAMUSCULAR | Status: DC | PRN
  Administered 2013-02-14: 25 ug via INTRAVENOUS

## 2013-02-14 MED ORDER — CARVEDILOL 12.5 MG PO TABS
12.5000 mg | ORAL_TABLET | Freq: Two times a day (BID) | ORAL | Status: DC
  Administered 2013-02-14 – 2013-02-16 (×5): 12.5 mg via ORAL
  Filled 2013-02-14 (×9): qty 1

## 2013-02-14 MED ORDER — FENTANYL CITRATE 0.05 MG/ML IJ SOLN
INTRAMUSCULAR | Status: AC
  Filled 2013-02-14: qty 2

## 2013-02-14 MED ORDER — CARVEDILOL 12.5 MG PO TABS
12.5000 mg | ORAL_TABLET | Freq: Two times a day (BID) | ORAL | Status: DC
  Filled 2013-02-14 (×2): qty 1

## 2013-02-14 MED ORDER — SODIUM CHLORIDE 0.9 % IV SOLN
INTRAVENOUS | Status: DC
  Administered 2013-02-14: 10:00:00 via INTRAVENOUS

## 2013-02-14 NOTE — H&P (View-Only) (Signed)
Referring Provider: PCCM; Dr. Halford Chessman Primary Care Physician:  Glenda Chroman., MD Primary Gastroenterologist:  ? Dr. Laural Golden  Reason for Consultation:  Melena  HPI: Katrina Meyer is a 80 y.o. female w/ multiple co-morbidities including ESRD (MWF -received last HD 1/9), DM, severe PAD s/p recent left AKA, and dementia. She was admitted to [facility] on 1/9 w/ poor po intake, decreased LOC and melena. Had persistent hypotension in spite of 1.5 liters NS, initial hgb 11.2. PCCM asked to admit.  Apparently she was just discharged from a hospital in [place] earlier this week after AKA.  While at home she was having some black stools, according to reports.  This AM she had a stool that had more red color than black.  Hgb this AM was 9.0 grams.  She had a colonoscopy earlier this year (03/2102) by Dr. Laural Golden, which revealed the following:  "Six polyps snared including large pedunculated polyp over 30 mm in the sigmoid colon with ulcerated surface. She possibly bled from this polyp. Single Hemoclip applied to polypectomy site. Two small polyps coagulated as above.  Multiple diverticula at sigmoid colon."  There are reports that she had EGD at the same time as well, but I do not see that report.   She is on ASA at home.  Her nurse reports that family said she has experienced problems with bleeding while on ASA in the past.  Patient does not speak much to strangers apparently due to her dementia.  She does not say anything during out visit with her today.  Family was not present.  Had colon resection in 2006 (right hemicolectomy) due to a large, flat polyp that could not be removed endoscopically.   Past Medical History  Diagnosis Date  . Hyperlipidemia     takes Lipitor daily  . DVT (deep venous thrombosis)   . Stroke   . Parkinson's disease     takes Sinemet daily  . Tibia/fibula fracture 01/04/2012    Treated with long leg casting. Cast change in hospital 02/27/12   . Peripheral vascular disease  02/27/2012    Skin ulcer of right great toe  . Atrial fibrillation 03/09/2012    Seen on older EKG as well but family not aware.   . Macrocytic anemia 03/09/2012  . Lower GI bleed 03/10/2012    hematochezia secondary to ulcerated polyps; diverticulosis; chronic diarrhea  . End stage renal disease on dialysis     [facility]- MWF  . Hypertension     takes Coreg daily  . Dementia     takes Aricept daily  . Diabetes mellitus, type II     takes Lantus and Novolog  . Seasonal allergies     takes Allegra daily  . Depression     takes Prozac daily  . Hypothyroidism     takes Synthroid daily    Past Surgical History  Procedure Laterality Date  . Dg av dialysis graft declot or    . Av fistula repair  Aug. 2013    Left arm  . Cholecystectomy    . Colon surgery      Aprrox 2006 at [facility] after she had a colonoscopy  . Colonoscopy  03/10/2012    Procedure: COLONOSCOPY;  Surgeon: Rogene Houston, MD;  Location: [facility];  Service: Endoscopy;  Laterality: N/A;  . Esophagogastroduodenoscopy  03/10/2012    Procedure: ESOPHAGOGASTRODUODENOSCOPY (EGD);  Surgeon: Rogene Houston, MD;  Location: [facility];  Service: Endoscopy;  Laterality: N/A;  EGD if  TCS normal.  . Ligation of arteriovenous  fistula Left 03/24/2012    Procedure: LIGATION OF ARTERIOVENOUS  FISTULA;  Surgeon: Conrad Senecaville, MD;  Location: [place];  Service: Vascular;  Laterality: Left;  . Av fistula placement Right 04/28/2012    Procedure: INSERTION OF ARTERIOVENOUS (AV) GORE-TEX GRAFT ARM;  Surgeon: Elam Dutch, MD;  Location: [facility] OR;  Service: Vascular;  Laterality: Right;  . Leg amputation above knee Left   . Angioplast of lle  12/2012-01/2013    Several Procedures    Prior to Admission medications   Medication Sig Start Date End Date Taking? Authorizing Provider  ASPIRIN PO Take 1 tablet by mouth every morning.   Yes Historical Provider, MD  atorvastatin (LIPITOR) 10 MG tablet Take 10 mg by mouth every morning.     Yes Historical Provider, MD  calcium acetate, Phos Binder, (PHOSLYRA) 667 MG/5ML SOLN Take 667-1,334 mg by mouth 3 (three) times daily with meals. Takes 67mls with snacks and 34mls with meals   Yes Historical Provider, MD  carbidopa-levodopa (SINEMET IR) 25-100 MG per tablet Take 1.5 tablets by mouth 3 (three) times daily.   Yes Historical Provider, MD  carvedilol (COREG) 12.5 MG tablet Take 12.5 mg by mouth 2 (two) times daily with a meal.   Yes Historical Provider, MD  cinacalcet (SENSIPAR) 30 MG tablet Take 30 mg by mouth every evening.    Yes Historical Provider, MD  donepezil (ARICEPT) 10 MG tablet Take 10 mg by mouth at bedtime.    Yes Historical Provider, MD  FLUoxetine (PROZAC) 40 MG capsule Take 40 mg by mouth every morning.    Yes Historical Provider, MD  insulin glargine (LANTUS) 100 UNIT/ML injection Inject 6 Units into the skin at bedtime.    Yes Historical Provider, MD  insulin NPH-regular (NOVOLIN 70/30) (70-30) 100 UNIT/ML injection Inject 1-5 Units into the skin 3 (three) times daily. Per sliding scale   Yes Historical Provider, MD  levothyroxine (SYNTHROID, LEVOTHROID) 25 MCG tablet Take 25 mcg by mouth every morning.   Yes Historical Provider, MD  Loratadine-Pseudoephedrine (CLARITIN-D 24 HOUR PO) Take 1 tablet by mouth at bedtime.    Yes Historical Provider, MD  multivitamin (RENA-VIT) TABS tablet Take 1 tablet by mouth 2 (two) times daily.    Yes Historical Provider, MD    Current Facility-Administered Medications  Medication Dose Route Frequency Provider Last Rate Last Dose  . 0.9 %  sodium chloride infusion   Intravenous Continuous Erick Colace, NP 75 mL/hr at 02/13/13 1015    . carbidopa-levodopa (SINEMET IR) 25-100 MG per tablet immediate release 1.5 tablet  1.5 tablet Oral TID Erick Colace, NP      . Chlorhexidine Gluconate Cloth 2 % PADS 6 each  6 each Topical Q0600 Chesley Mires, MD      . cinacalcet (SENSIPAR) tablet 30 mg  30 mg Oral QPM Erick Colace, NP      .  donepezil (ARICEPT) tablet 10 mg  10 mg Oral QHS Erick Colace, NP      . FLUoxetine (PROZAC) capsule 40 mg  40 mg Oral q morning - 10a Erick Colace, NP      . levothyroxine (SYNTHROID, LEVOTHROID) tablet 25 mcg  25 mcg Oral q morning - 10a Erick Colace, NP      . multivitamin (RENA-VIT) tablet 1 tablet  1 tablet Oral BID Erick Colace, NP      . mupirocin ointment (BACTROBAN) 2 % 1  application  1 application Nasal BID Chesley Mires, MD      . ondansetron [facility]) injection 4 mg  4 mg Intravenous Q8H PRN Johnna Acosta, MD      . pantoprazole (PROTONIX) 80 mg in sodium chloride 0.9 % 250 mL infusion  8 mg/hr Intravenous Continuous Jonetta Osgood, MD 25 mL/hr at 02/13/13 0321 8 mg/hr at 02/13/13 0321    Allergies as of 02/12/2013 - Review Complete 02/12/2013  Allergen Reaction Noted  . Hydralazine Rash 04/15/2012    Family History  Problem Relation Age of Onset  . Stroke Mother   . Diabetes Mother   . Cancer Mother   . Hypertension Mother   . Coronary artery disease Father   . Diabetes Sister     History   Social History  . Marital Status: Married    Spouse Name: N/A    Number of Children: N/A  . Years of Education: N/A   Occupational History  . Not on file.   Social History Main Topics  . Smoking status: Passive Smoke Exposure - Never Smoker    Types: Cigarettes  . Smokeless tobacco: Never Used  . Alcohol Use: No  . Drug Use: No  . Sexual Activity: No   Other Topics Concern  . Not on file   Social History Narrative  . No narrative on file    Review of Systems: Ten point ROS is O/W negative except as mentioned in HPI.  Physical Exam: Vital signs in last 24 hours: Temp:  [97.6 F (36.4 C)-98.1 F (36.7 C)] 97.6 F (36.4 C) (01/10 0753) Pulse Rate:  [25-124] 110 (01/10 0800) Resp:  [11-23] 13 (01/10 0800) BP: (51-147)/(17-101) 102/27 mmHg (01/10 0800) SpO2:  [49 %-100 %] 100 % (01/10 0800) Weight:  [124 lb 9 oz (56.501 kg)-129 lb 10.1 oz (58.8  kg)] 129 lb 10.1 oz (58.8 kg) (01/10 0454) Last BM Date: 02/13/13 General:  Alert, Chronically ill-appearing, in NAD. Head:  Normocephalic and atraumatic. Eyes:  Sclera clear, no icterus.   Conjunctiva pink. Ears:  Normal auditory acuity. Mouth:  No deformity or lesions.   Lungs:  Clear throughout to auscultation.  No wheezes, crackles, or rhonchi.  Heart:  Tachy.  Irregularly irregular. Abdomen:  Soft, non-distended.  BS present.  Non-tender. Rectal:  Deferred  Msk:  Symmetrical without gross deformities.  Left AKA. Pulses:  Normal pulses noted. Extremities:  Left AKA. Neurologic:  Alert non-verbal.  Non-focal. Skin:  Intact without significant lesions or rashes.. Lab Results:  Recent Labs  02/12/13 2224  WBC 19.7*  HGB 11.2*  HCT 36.3  PLT 414*   BMET  Recent Labs  02/12/13 2224  NA 140  K 3.7  CL 94*  CO2 27  GLUCOSE 161*  BUN 12  CREATININE 1.61*  CALCIUM 9.9   LFT  Recent Labs  02/12/13 2224  PROT 7.1  ALBUMIN 2.2*  AST 20  ALT <5  ALKPHOS 120*  BILITOT 0.5   PT/INR  Recent Labs  02/12/13 2224  LABPROT 16.9*  INR 1.41   Studies/Results: Ct Head Wo Contrast  02/13/2013   CLINICAL DATA:  Acute mental status changes. Recent below-knee amputation.  EXAM: CT HEAD WITHOUT CONTRAST  TECHNIQUE: Contiguous axial images were obtained from the base of the skull through the vertex without intravenous contrast.  COMPARISON:  MRI brain 06/04/2012. CT head 06/03/2012, 03/09/2012, 01/04/2012.  FINDINGS: Moderate to severe cortical and deep atrophy and moderate cerebellar atrophy, unchanged. Old stroke in the right inferior  cerebellar hemisphere, unchanged. Severe changes of small vessel disease of the white matter diffusely, unchanged. Old lacunar strokes in the left basal ganglia, unchanged. No mass lesion. No midline shift. No acute hemorrhage or hematoma. No extra-axial fluid collections. No evidence of acute infarction.  No focal osseous abnormality involving  the skull. Visualized paranasal sinuses, bilateral mastoid air cells, and bilateral middle ear cavities well-aerated. Extensive bilateral carotid siphon and bilateral vertebral artery atherosclerosis.  IMPRESSION: 1. No acute intracranial abnormality. 2. Stable moderate to severe generalized atrophy and severe chronic microvascular ischemic changes of the white matter. 3. Stable old right inferior cerebellar stroke. 4. Stable old lacunar strokes in the left basal ganglia.   Electronically Signed   By: Evangeline Dakin M.D.   On: 02/13/2013 07:23   Dg Chest Port 1 View  02/13/2013   CLINICAL DATA:  Central line placement.  EXAM: PORTABLE CHEST - 1 VIEW  COMPARISON:  Single view of the chest 06/03/2012.  FINDINGS: Right IJ catheter is in place with the tip projecting over the lower superior vena cava. No pneumothorax. Lungs are clear. Heart size is enlarged. No pleural effusion is identified.  IMPRESSION: Tip of right IJ catheter projects over the lower superior vena cava. No pneumothorax.  Cardiomegaly without edema.   Electronically Signed   By: Inge Rise M.D.   On: 02/13/2013 02:25    IMPRESSION:  -Melena:  Hgb 11.2 grams on admission, but 9.0 grams this AM.  Had colonoscopy earlier this year with several polyps removed.  Rule out ulcer disease, AVM, etc. -Recent AKA -Dementia -Multiple medical problems   PLAN: -Monitor Hgb. -Plan for EGD tomorrow, 1/11. -She is on PPI gtt, which can be continued for now until after EGD.   ZEHR, JESSICA D.  02/13/2013, 10:46 AM  Pager number [phone]   ________________________________________________________________________  Velora Heckler GI MD note:  I personally examined the patient, reviewed the data and agree with the assessment and plan described above.  Planning on EGD tomorrow AM.   Owens Loffler, MD [facility] Gastroenterology Pager [phone]

## 2013-02-14 NOTE — Interval H&P Note (Signed)
History and Physical Interval Note:  02/14/2013 8:35 AM  Katrina Meyer  has presented today for surgery, with the diagnosis of GIB  The various methods of treatment have been discussed with the patient and family. After consideration of risks, benefits and other options for treatment, the patient has consented to  Procedure(s): ESOPHAGOGASTRODUODENOSCOPY (EGD) (N/A) as a surgical intervention .  [name] history has been reviewed, patient examined, no change in status, stable for surgery.  I have reviewed the patient's chart and labs.  Questions were answered to the patient's satisfaction.     Milus Banister

## 2013-02-14 NOTE — Progress Notes (Signed)
S: Refuses to speak O:BP 116/48  Pulse 120  Temp(Src) 98.3 F (36.8 C) (Oral)  Resp 14  Ht 5\' 9"  (1.753 m)  Wt 62.1 kg (136 lb 14.5 oz)  BMI 20.21 kg/m2  SpO2 96%  Intake/Output Summary (Last 24 hours) at 02/14/13 1154 Last data filed at 02/14/13 0800  Gross per 24 hour  Intake   2085 ml  Output      0 ml  Net   2085 ml   Weight change:  VFI:EPPIR and alert JJO:ACZYS reg Resp: clear Abd:+ BS NTND Ext: no edema Lt AKA  Rt AVG + bruit NEURO: she speaks according to her daughter but choose to be non verbal at times   . antiseptic oral rinse  15 mL Mouth Rinse BID  . calcium acetate  667 mg Oral TID WC  . carbidopa-levodopa  1.5 tablet Oral TID  . carvedilol  12.5 mg Oral BID WC  . Chlorhexidine Gluconate Cloth  6 each Topical Q0600  . cinacalcet  30 mg Oral Q supper  . [START ON 02/17/2013] darbepoetin (ARANESP) injection - DIALYSIS  40 mcg Intravenous Q Wed-HD  . donepezil  10 mg Oral QHS  . [START ON 02/15/2013] doxercalciferol  2 mcg Intravenous Q M,W,F-HD  . [START ON 02/15/2013] ferric gluconate (FERRLECIT/NULECIT) IV  62.5 mg Intravenous Q M,W,F-HD  . FLUoxetine  40 mg Oral q morning - 10a  . levothyroxine  25 mcg Oral q morning - 10a  . multivitamin  1 tablet Oral BID  . mupirocin ointment  1 application Nasal BID  . pantoprazole  40 mg Oral BID AC   Ct Head Wo Contrast  02/13/2013   CLINICAL DATA:  Acute mental status changes. Recent below-knee amputation.  EXAM: CT HEAD WITHOUT CONTRAST  TECHNIQUE: Contiguous axial images were obtained from the base of the skull through the vertex without intravenous contrast.  COMPARISON:  MRI brain 06/04/2012. CT head 06/03/2012, 03/09/2012, 01/04/2012.  FINDINGS: Moderate to severe cortical and deep atrophy and moderate cerebellar atrophy, unchanged. Old stroke in the right inferior cerebellar hemisphere, unchanged. Severe changes of small vessel disease of the white matter diffusely, unchanged. Old lacunar strokes in the left basal  ganglia, unchanged. No mass lesion. No midline shift. No acute hemorrhage or hematoma. No extra-axial fluid collections. No evidence of acute infarction.  No focal osseous abnormality involving the skull. Visualized paranasal sinuses, bilateral mastoid air cells, and bilateral middle ear cavities well-aerated. Extensive bilateral carotid siphon and bilateral vertebral artery atherosclerosis.  IMPRESSION: 1. No acute intracranial abnormality. 2. Stable moderate to severe generalized atrophy and severe chronic microvascular ischemic changes of the white matter. 3. Stable old right inferior cerebellar stroke. 4. Stable old lacunar strokes in the left basal ganglia.   Electronically Signed   By: Evangeline Dakin M.D.   On: 02/13/2013 07:23   Dg Chest Port 1 View  02/14/2013   CLINICAL DATA:  Edema, renal failure  EXAM: PORTABLE CHEST - 1 VIEW  COMPARISON:  02/13/2013  FINDINGS: Right IJ central line tip at the SVC RA junction level. Stable cardiomegaly with vascular congestion. No current edema. No new collapse or consolidation. Negative for effusion or pneumothorax. Atherosclerosis of the thoracic aorta. Degenerative changes of the spine.  IMPRESSION: Cardiomegaly with vascular congestion.  Stable exam.   Electronically Signed   By: Daryll Brod M.D.   On: 02/14/2013 07:21   Dg Chest Port 1 View  02/13/2013   CLINICAL DATA:  Central line placement.  EXAM: PORTABLE  CHEST - 1 VIEW  COMPARISON:  Single view of the chest 06/03/2012.  FINDINGS: Right IJ catheter is in place with the tip projecting over the lower superior vena cava. No pneumothorax. Lungs are clear. Heart size is enlarged. No pleural effusion is identified.  IMPRESSION: Tip of right IJ catheter projects over the lower superior vena cava. No pneumothorax.  Cardiomegaly without edema.   Electronically Signed   By: Inge Rise M.D.   On: 02/13/2013 02:25   BMET    Component Value Date/Time   NA 141 02/14/2013 0309   K 3.6* 02/14/2013 0309   CL  107 02/14/2013 0309   CO2 21 02/14/2013 0309   GLUCOSE 163* 02/14/2013 0309   BUN 18 02/14/2013 0309   CREATININE 2.51* 02/14/2013 0309   CALCIUM 8.9 02/14/2013 0309   GFRNONAA 17* 02/14/2013 0309   GFRAA 20* 02/14/2013 0309   CBC    Component Value Date/Time   WBC 15.4* 02/14/2013 1012   RBC 2.64* 02/14/2013 1012   RBC 3.47* 03/09/2012 2004   HGB 8.1* 02/14/2013 1012   HCT 26.4* 02/14/2013 1012   PLT 322 02/14/2013 1012   MCV 100.0 02/14/2013 1012   MCH 30.7 02/14/2013 1012   MCHC 30.7 02/14/2013 1012   RDW 17.6* 02/14/2013 1012   LYMPHSABS 1.1 02/12/2013 2224   MONOABS 1.3* 02/12/2013 2224   EOSABS 0.1 02/12/2013 2224   BASOSABS 0.1 02/12/2013 2224     Assessment: 1. GI bleed  Mod gastritis and duodenitis 2. ESRD 3. Sec HPTH 4 PVD SP LT AKA 5. A. Fib  Plan: 1. HD in AM 2 Follow CBC   Margert Edsall T

## 2013-02-14 NOTE — Progress Notes (Signed)
Utilization review completed.  

## 2013-02-14 NOTE — Progress Notes (Addendum)
Name: Katrina Meyer MRN: [id number] DOB: 1932-08-07    ADMISSION DATE:  02/12/2013 CONSULTATION DATE:  1/9  REFERRING MD :  Zenia Resides PRIMARY SERVICE: PCCM   CHIEF COMPLAINT:  Hypotension and decreased MS   BRIEF PATIENT DESCRIPTION:  80 year old w/ multiple co-morbids: ESRD (MWF -received last HD 1/9), DM, severe PAD s/p recent left AKA, dementia, admitted to [facility] on 1/9 w/ poor po intake, decreased LOC and melena. Had persistent hypotension in spite of 1.5 liters NS, initial hgb 11.2. PCCM asked to admit.   SIGNIFICANT EVENTS: 1/10 Nephrology, GI consulted  STUDIES:  CT head 1/9: neg, Stable moderate to severe generalized atrophy and severe chronic microvascular ischemic changes of the white matter.  Stable old right inferior cerebellar stroke.  Stable old lacunar strokes in the left basal ganglia.    LINES / TUBES: Right IJ CVL 1/10>>>  CULTURES: None  ANTIBIOTICS: None  Allergies  Allergen Reactions  . Hydralazine Rash   SUBJECTIVE:  Awake, follows commands but nonverbal  VITAL SIGNS: Temp:  [97.6 F (36.4 C)-98.2 F (36.8 C)] 97.7 F (36.5 C) (01/11 0425) Pulse Rate:  [103-123] 111 (01/11 0500) Resp:  [9-24] 15 (01/11 0500) BP: (83-136)/(26-83) 117/62 mmHg (01/11 0500) SpO2:  [96 %-100 %] 99 % (01/11 0500) HEMODYNAMICS: CVP:  [0 mmHg-8 mmHg] 6 mmHg VENTILATOR SETTINGS:   INTAKE / OUTPUT: Intake/Output     01/10 0701 - 01/11 0700 01/11 0701 - 01/12 0700   I.V. (mL/kg) 1906.3 (32.4)    IV Piggyback 200    Total Intake(mL/kg) 2106.3 (35.8)    Net +2106.3          Stool Occurrence 3 x      PHYSICAL EXAMINATION: General:  Awake, in no distress. Follows commands Neuro:  Awake, non-vocal,  moves all ext HEENT:  Poor dentition, no JVD, EOMI Cardiovascular:  Tachy irreg irreg + systolic murmur, R AVG, w/ good bruit, +thrill Lungs:  CTA b/l Abdomen:  Non-tender, Normoactive BS  Musculoskeletal:  Left AKA, dressing and immobilizer in-place. No LE  edema.  LABS:  CBC  Recent Labs Lab 02/13/13 1012 02/13/13 1825 02/14/13 0214  WBC 17.2* 16.1* 14.7*  HGB 9.0* 8.8* 8.3*  HCT 29.4* 28.8* 26.9*  PLT 342 340 344   Coag's  Recent Labs Lab 02/12/13 2224 02/13/13 1012  APTT 48* 52*  INR 1.41 1.53*   BMET  Recent Labs Lab 02/12/13 2224 02/13/13 1012 02/14/13 0309  NA 140 143 141  K 3.7 3.3* 3.6*  CL 94* 103 107  CO2 27 25 21   BUN 12 16 18   CREATININE 1.61* 2.09* 2.51*  GLUCOSE 161* 104* 163*   Electrolytes  Recent Labs Lab 02/12/13 2224 02/13/13 1012 02/14/13 0309  CALCIUM 9.9 8.8 8.9  MG  --  1.8  --   PHOS  --  4.3  --    Sepsis Markers  Recent Labs Lab 02/12/13 2225 02/13/13 1011  LATICACIDVEN 3.3* 0.8  PROCALCITON  --  4.96   ABG No results found for this basename: PHART, PCO2ART, PO2ART,  in the last 168 hours Liver Enzymes  Recent Labs Lab 02/12/13 2224 02/13/13 1012  AST 20 12  ALT <5 <5  ALKPHOS 120* 90  BILITOT 0.5 0.2*  ALBUMIN 2.2* 1.7*   Cardiac Enzymes No results found for this basename: TROPONINI, PROBNP,  in the last 168 hours Glucose  Recent Labs Lab 02/13/13 1105 02/13/13 1539 02/13/13 1611 02/13/13 1933 02/13/13 2347 02/14/13 0338  GLUCAP 101* 279* 90  137* 218* 148*    Imaging Ct Head Wo Contrast  02/13/2013   CLINICAL DATA:  Acute mental status changes. Recent below-knee amputation.  EXAM: CT HEAD WITHOUT CONTRAST  TECHNIQUE: Contiguous axial images were obtained from the base of the skull through the vertex without intravenous contrast.  COMPARISON:  MRI brain 06/04/2012. CT head 06/03/2012, 03/09/2012, 01/04/2012.  FINDINGS: Moderate to severe cortical and deep atrophy and moderate cerebellar atrophy, unchanged. Old stroke in the right inferior cerebellar hemisphere, unchanged. Severe changes of small vessel disease of the white matter diffusely, unchanged. Old lacunar strokes in the left basal ganglia, unchanged. No mass lesion. No midline shift. No acute  hemorrhage or hematoma. No extra-axial fluid collections. No evidence of acute infarction.  No focal osseous abnormality involving the skull. Visualized paranasal sinuses, bilateral mastoid air cells, and bilateral middle ear cavities well-aerated. Extensive bilateral carotid siphon and bilateral vertebral artery atherosclerosis.  IMPRESSION: 1. No acute intracranial abnormality. 2. Stable moderate to severe generalized atrophy and severe chronic microvascular ischemic changes of the white matter. 3. Stable old right inferior cerebellar stroke. 4. Stable old lacunar strokes in the left basal ganglia.   Electronically Signed   By: Evangeline Dakin M.D.   On: 02/13/2013 07:23   Dg Chest Port 1 View  02/14/2013   CLINICAL DATA:  Edema, renal failure  EXAM: PORTABLE CHEST - 1 VIEW  COMPARISON:  02/13/2013  FINDINGS: Right IJ central line tip at the SVC RA junction level. Stable cardiomegaly with vascular congestion. No current edema. No new collapse or consolidation. Negative for effusion or pneumothorax. Atherosclerosis of the thoracic aorta. Degenerative changes of the spine.  IMPRESSION: Cardiomegaly with vascular congestion.  Stable exam.   Electronically Signed   By: Daryll Brod M.D.   On: 02/14/2013 07:21   Dg Chest Port 1 View  02/13/2013   CLINICAL DATA:  Central line placement.  EXAM: PORTABLE CHEST - 1 VIEW  COMPARISON:  Single view of the chest 06/03/2012.  FINDINGS: Right IJ catheter is in place with the tip projecting over the lower superior vena cava. No pneumothorax. Lungs are clear. Heart size is enlarged. No pleural effusion is identified.  IMPRESSION: Tip of right IJ catheter projects over the lower superior vena cava. No pneumothorax.  Cardiomegaly without edema.   Electronically Signed   By: Inge Rise M.D.   On: 02/13/2013 02:25     ZOX:WRUEA clear and w/out airspace disease.   ASSESSMENT / PLAN:  PULMONARY A:  No acute issue  P:   Pulse ox Monitor for evidence of volume  overload  O2 as needed   CARDIOVASCULAR A:  Hypovolemic shock, presume mix of hemorrhagic and hypovolemia after HD >UGIB, on asa and plavix.  AF w/ RVR improved, pulse now in 110s Recent Left AKA, staples intact. Surgical site CD&I P:  Hold asa and plavix  IVF - kvo  Dry sterile dressing daily   RENAL A:   ESRD, last HD 1/9 P:   - plan for HD on 1/12  GASTROINTESTINAL A:   Probable UGIB likely secondary to gastritis >of not also has h/o LIGB w/ polyps P:   D/C Ppi gtt transition to IV, needs PPI BID for 2 months then daily will hold aspirin and plavix.  CBC in AM  HEMATOLOGIC A:   Acute on chronic anemia.  H/o macrocytic anemia  P:  Hold asa and plavix Repeat CBC  Consider transfuse if hgb drops and hypotensive   INFECTIOUS A:   No  evidence of infection  P:   Trend cbc Ck bcx2 if spikes temp  ENDOCRINE A:   H/o DM H/o hypothyroid  P:   Trend cbg while NPO Start ssi if hyperglycemic Continue synthroid   Recent Labs Lab 02/13/13 1611 02/13/13 1933 02/13/13 2347 02/14/13 0338 02/14/13 0739  GLUCAP 90 137* 218* 148* 125*    NEUROLOGIC A:   Dementia (currently at baseline) parkinsons dz P:   Continue sinemet    Derm A: Right foot venous stasis ulcer 1X2cm sacral ulcer w/ dressing in place.  P: Dressing per protocol   TODAY'S SUMMARY: EGD today showed gastritis, stable for transfer out of ICU.   Pulmonary and [place] Pager: [phone]  Joni Reining, DO PGY-1 02/14/2013, 7:26 AM  Stable after EGD this morning.  Transferred to telemetry.  Will start to resume outpt medications.  [facility] to assume care from 1/12 and PCCM sign off.  Chesley Mires, MD [facility] Pulmonary/Critical Care 02/14/2013, 3:14 PM Pager:  [phone] After 3pm call: [phone]

## 2013-02-14 NOTE — Plan of Care (Signed)
Problem: Phase I Progression Outcomes Goal: Voiding-avoid urinary catheter unless indicated Outcome: Completed/Met Date Met:  02/14/13 Patient anuric at baseline, HD M-W-F

## 2013-02-14 NOTE — Op Note (Signed)
[facility] [place], [address]   ENDOSCOPY PROCEDURE REPORT  PATIENT: Katrina, Meyer  MR#: [id number] BIRTHDATE: 02-14-1932 , 80  yrs. old GENDER: Female ENDOSCOPIST: Milus Banister, MD PROCEDURE DATE:  02/14/2013 PROCEDURE:  EGD w/ biopsy ASA CLASS:     Class III INDICATIONS:  melena, anemia; colonoscopy 2014 Dr.  Laural Golden found small TAs. MEDICATIONS: Fentanyl 12.5 mcg IV and Versed 1mg  IV TOPICAL ANESTHETIC: Cetacaine Spray  DESCRIPTION OF PROCEDURE: After the risks benefits and alternatives of the procedure were thoroughly explained, informed consent was obtained.  The Pentax Gastroscope [id number] endoscope was introduced through the mouth and advanced to the second portion of the duodenum. Without limitations.  The instrument was slowly withdrawn as the mucosa was fully examined.    There was moderate nodular distal gastritis and duodenitis. Biopsies were taken from distal stomach and sent to pathology. There was a small hiatal hernia without Cameron's type erosions. There was no blood in UGI tract.  Retroflexed views revealed no abnormalities.     The scope was then withdrawn from the patient and the procedure completed.  COMPLICATIONS: There were no complications. ENDOSCOPIC IMPRESSION: There was moderate nodular distal gastritis and duodenitis. Biopsies were taken from distal stomach and sent to pathology. There was a small hiatal hernia without Cameron's type erosions. There was no blood in UGI tract.  RECOMMENDATIONS: She was not taking PPI as outpatient that I can tell.  The gastritis, duodenitis noted today may account for her melena.  She should be on PPI twice daily for the next two months and then OK to back down to once daily.  Biopsies taken for H.  pylori, if these are positive then I will start her on appropriate antibiotics.  OK to advance diet and observe for recurrent bleeding.   eSigned:  Milus Banister,  MD 02/14/2013 8:55 AM   CC: Hildred Laser, MD

## 2013-02-15 DIAGNOSIS — D62 Acute posthemorrhagic anemia: Secondary | ICD-10-CM

## 2013-02-15 LAB — CBC
HCT: 23.2 % — ABNORMAL LOW (ref 36.0–46.0)
HCT: 34.4 % — ABNORMAL LOW (ref 36.0–46.0)
HCT: 32.9 % — ABNORMAL LOW (ref 36.0–46.0)
HEMOGLOBIN: 7.4 g/dL — AB (ref 12.0–15.0)
Hemoglobin: 11.3 g/dL — ABNORMAL LOW (ref 12.0–15.0)
Hemoglobin: 10.6 g/dL — ABNORMAL LOW (ref 12.0–15.0)
MCH: 31.6 pg (ref 26.0–34.0)
MCH: 31.5 pg (ref 26.0–34.0)
MCH: 31.5 pg (ref 26.0–34.0)
MCHC: 31.9 g/dL (ref 30.0–36.0)
MCHC: 32.8 g/dL (ref 30.0–36.0)
MCHC: 32.2 g/dL (ref 30.0–36.0)
MCV: 99.1 fL (ref 78.0–100.0)
MCV: 95.8 fL (ref 78.0–100.0)
MCV: 97.9 fL (ref 78.0–100.0)
PLATELETS: 275 10*3/uL (ref 150–400)
PLATELETS: 204 10*3/uL (ref 150–400)
Platelets: 213 10*3/uL (ref 150–400)
RBC: 2.34 MIL/uL — ABNORMAL LOW (ref 3.87–5.11)
RBC: 3.59 MIL/uL — ABNORMAL LOW (ref 3.87–5.11)
RBC: 3.36 MIL/uL — ABNORMAL LOW (ref 3.87–5.11)
RDW: 17.5 % — AB (ref 11.5–15.5)
RDW: 16 % — ABNORMAL HIGH (ref 11.5–15.5)
RDW: 16.5 % — AB (ref 11.5–15.5)
WBC: 9.3 10*3/uL (ref 4.0–10.5)
WBC: 9.3 10*3/uL (ref 4.0–10.5)
WBC: 8.4 10*3/uL (ref 4.0–10.5)

## 2013-02-15 LAB — RENAL FUNCTION PANEL
ALBUMIN: 1.6 g/dL — AB (ref 3.5–5.2)
BUN: 21 mg/dL (ref 6–23)
CALCIUM: 9.2 mg/dL (ref 8.4–10.5)
CO2: 22 mEq/L (ref 19–32)
CREATININE: 3.43 mg/dL — AB (ref 0.50–1.10)
Chloride: 108 mEq/L (ref 96–112)
GFR calc Af Amer: 14 mL/min — ABNORMAL LOW (ref >90)
GFR calc non Af Amer: 12 mL/min — ABNORMAL LOW (ref >90)
GLUCOSE: 97 mg/dL (ref 70–99)
PHOSPHORUS: 3.9 mg/dL (ref 2.3–4.6)
Potassium: 3.6 mEq/L — ABNORMAL LOW (ref 3.7–5.3)
Sodium: 140 mEq/L (ref 137–147)

## 2013-02-15 LAB — GLUCOSE, RANDOM: Glucose, Bld: 73 mg/dL (ref 70–99)

## 2013-02-15 LAB — GLUCOSE, CAPILLARY
GLUCOSE-CAPILLARY: 81 mg/dL (ref 70–99)
Glucose-Capillary: 78 mg/dL (ref 70–99)
Glucose-Capillary: 88 mg/dL (ref 70–99)

## 2013-02-15 LAB — PREPARE RBC (CROSSMATCH)

## 2013-02-15 MED ORDER — INSULIN ASPART 100 UNIT/ML ~~LOC~~ SOLN: 0.0000 [IU] | Freq: Three times a day (TID) | SUBCUTANEOUS | Status: DC

## 2013-02-15 MED ORDER — DARBEPOETIN ALFA-POLYSORBATE 40 MCG/0.4ML IJ SOLN
INTRAMUSCULAR | Status: AC
  Administered 2013-02-15: 40 ug via INTRAVENOUS
  Filled 2013-02-15: qty 0.4

## 2013-02-15 MED ORDER — DARBEPOETIN ALFA-POLYSORBATE 40 MCG/0.4ML IJ SOLN
40.0000 ug | INTRAMUSCULAR | Status: DC
  Administered 2013-02-15: 40 ug via INTRAVENOUS

## 2013-02-15 MED ORDER — ATORVASTATIN CALCIUM 10 MG PO TABS
10.0000 mg | ORAL_TABLET | Freq: Every morning | ORAL | Status: DC
  Administered 2013-02-16 – 2013-02-17 (×2): 10 mg via ORAL
  Filled 2013-02-15 (×3): qty 1

## 2013-02-15 MED ORDER — DOXERCALCIFEROL 4 MCG/2ML IV SOLN
INTRAVENOUS | Status: AC
  Administered 2013-02-15: 2 ug via INTRAVENOUS
  Filled 2013-02-15: qty 2

## 2013-02-15 NOTE — Procedures (Signed)
I was present at this dialysis session, have reviewed the session itself and made  appropriate changes  Kelly Splinter MD (pgr) [phone]    ([phone] 02/15/2013, 11:46 AM  d

## 2013-02-15 NOTE — Progress Notes (Signed)
[facility] Progress Note  Subjective:   Not speaking  Objective Filed Vitals:   02/15/13 0745 02/15/13 0815 02/15/13 0844 02/15/13 0906  BP: 114/70 129/86 95/66 104/71  Pulse: 104 114 115 112  Temp:      TempSrc:      Resp: 14 16 16 16   Height:      Weight:      SpO2:       Physical Exam General: Eyes open but nonverbal Heart: tachy irreg Lungs: dim BS - nonparticipatory on e Abdomen: soft NT + BS Extremities: tr LE edema on right with SCD and left AKA with brace Dialysis Access:  Right upper AVGG Qb 300  Dialysis Orders: Center: [facility] on MWF .  EDW was 65 before AKA 9 yest post. Hd 56.5 kg HD Bath 2.0 k, 2.25ca Time 3hrs 24min Heparin 2000. Access RUAAVGG BFR 300 DFR 800  Hectorol 2 mcg IV/HD Epogen 4800 Units IV/HD Venofer 100 mg Needs 9 doses  Assessment/Plan: 1. Hypotension/volume - Due to ? Gi bleed/ Infec? Sp AKA / CCM rx/ pul vasc congrestion on adm CXR 2. ESRD= HD MWF K3.6 - use 4 K bath no hep on hd 3. GI Bleed= Endo showed mod gastritis and duodenitis bx pending - GI rec bid PPI x 2 months then q day 4. Anemia - Hgb 9.0 > 7.4 in 7s x 2 days - will transfuse 2 units - may help low BP and help stabilize afib; change aranesp to start today and continue IV Fe 5. Metabolic bone disease - Vit d on hd / binders when pos 6. PVD sp recent L AKA d/c from [place] on 1/8 following a prolonged stay for critical LLE ischemia with subsequent left AKA 7. A. Fib - BP variable -HR 110 - 120 - on carvedilol 12.5 bid but need to keep BP up so she can get med. 8. Dementia/Parkinson's - often nonverbal at [place], PA-C [place] 02/15/2013,9:08 AM  LOS: 3 days   I have seen and examined patient, discussed with PA and agree with assessment and plan as outlined above. Kelly Splinter MD pager [phone]    cell [phone] 02/15/2013, 11:48 AM     Additional Objective Labs: Basic Metabolic Panel:  Recent Labs Lab  02/13/13 1012 02/14/13 0309 02/15/13 0240  NA 143 141 140  K 3.3* 3.6* 3.6*  CL 103 107 108  CO2 25 21 22   GLUCOSE 104* 163* 97  BUN 16 18 21   CREATININE 2.09* 2.51* 3.43*  CALCIUM 8.8 8.9 9.2  PHOS 4.3  --  3.9   Liver Function Tests:  Recent Labs Lab 02/12/13 2224 02/13/13 1012 02/15/13 0240  AST 20 12  --   ALT <5 <5  --   ALKPHOS 120* 90  --   BILITOT 0.5 0.2*  --   PROT 7.1 5.3*  --   ALBUMIN 2.2* 1.7* 1.6*    Recent Labs Lab 02/13/13 1012  LIPASE 15   CBC:  Recent Labs Lab 02/12/13 2224  02/13/13 1825 02/14/13 0214 02/14/13 1012 02/14/13 1812 02/15/13 0241  WBC 19.7*  < > 16.1* 14.7* 15.4* 11.5* 9.3  NEUTROABS 17.1*  --   --   --   --   --   --   HGB 11.2*  < > 8.8* 8.3* 8.1* 7.6* 7.4*  HCT 36.3  < > 28.8* 26.9* 26.4* 25.1* 23.2*  MCV 100.0  < > 99.7 98.9 100.0 100.8* 99.1  PLT 414*  < > 340 344 322 302 275  < > = values in this interval not displayed. CBG:  Recent Labs Lab 02/14/13 1113 02/14/13 1628 02/14/13 2003 02/14/13 2352 02/15/13 0418  GLUCAP 127* 163* 135* 102* 81  Studies/Results: Dg Chest Port 1 View  02/14/2013   CLINICAL DATA:  Edema, renal failure  EXAM: PORTABLE CHEST - 1 VIEW  COMPARISON:  02/13/2013  FINDINGS: Right IJ central line tip at the SVC RA junction level. Stable cardiomegaly with vascular congestion. No current edema. No new collapse or consolidation. Negative for effusion or pneumothorax. Atherosclerosis of the thoracic aorta. Degenerative changes of the spine.  IMPRESSION: Cardiomegaly with vascular congestion.  Stable exam.   Electronically Signed   By: Daryll Brod M.D.   On: 02/14/2013 07:21   Medications: . sodium chloride Stopped (02/14/13 1008)   . antiseptic oral rinse  15 mL Mouth Rinse BID  . calcium acetate  667 mg Oral TID WC  . carbidopa-levodopa  1.5 tablet Oral TID  . carvedilol  12.5 mg Oral BID WC  . Chlorhexidine Gluconate Cloth  6 each Topical Q0600  . cinacalcet  30 mg Oral Q supper  .  [START ON 02/17/2013] darbepoetin (ARANESP) injection - DIALYSIS  40 mcg Intravenous Q Wed-HD  . donepezil  10 mg Oral QHS  . doxercalciferol  2 mcg Intravenous Q M,W,F-HD  . ferric gluconate (FERRLECIT/NULECIT) IV  62.5 mg Intravenous Q M,W,F-HD  . FLUoxetine  40 mg Oral q morning - 10a  . levothyroxine  25 mcg Oral q morning - 10a  . multivitamin  1 tablet Oral BID  . mupirocin ointment  1 application Nasal BID  . pantoprazole  40 mg Oral BID AC

## 2013-02-15 NOTE — Progress Notes (Signed)
PATIENT DETAILS Name: Katrina Meyer Age: 80 y.o. Sex: female Date of Birth: 08/18/1932 Admit Date: 02/12/2013 Admitting Physician Chesley Mires, MD QMV:HQIO,NGEXB B., MD  Subjective: Does not speak-but follows commands. This M.D., had spoken with the daughter on admission at [facility] is patient's baseline.  Assessment/Plan: Principal Problem:   GI bleed - Secondary to gastritis/duodenitis - EGD on 02/14/13, biopsies pending - Recommendations from gastroenterology or UTI twice a day for next 2 months, and then daily.    Hypovolemic/hemorrhagic shock  - Secondary to above and fluid removal post dialysis. Resolved with IV fluids.   Acute blood loss anemia  - Transfused 2 units of PRBC on 1/12 during dialysis, recheck CBC in a.m.   Atrial fibrillation - Had rapid ventricular response on admission - Currently rate controlled with Coreg - Not anticoagulation candidate -? Aspirin if okay with GI    End stage renal disease on dialysis - Per nephrology     Diabetes mellitus, type II - CBGs stable  - c/w SSI  Hypertension - Continue Coreg  Hypothyroidism - Continue levothyroxine   History of peripheral vascular disease- is post recent left AKA - Staples intact, would clean. - Given GI bleed with hemorrhagic shock-aspirin and Plavix on hold  History of Parkinson's disease - Continue Sinemet  Right foot venous stasis ulcer -1X2cm sacral ulcer w/ dressing in place.  - Per wound care    Dementia - On Aricept  Disposition: Remain inpatient  DVT Prophylaxis: SCD's  Code Status: Full code  Family Communication  none at bedside  Procedures:   EGD 1/11  CONSULTS:  GI  Time spent 40 minutes-which includes 50% of the time with face-to-face with patient/ family and coordinating care related to the above assessment and plan.  MEDICATIONS: Scheduled Meds: . antiseptic oral rinse  15 mL Mouth Rinse BID  . calcium acetate  667 mg Oral TID WC  .  carbidopa-levodopa  1.5 tablet Oral TID  . carvedilol  12.5 mg Oral BID WC  . Chlorhexidine Gluconate Cloth  6 each Topical Q0600  . cinacalcet  30 mg Oral Q supper  . darbepoetin (ARANESP) injection - DIALYSIS  40 mcg Intravenous Q Mon-HD  . donepezil  10 mg Oral QHS  . doxercalciferol  2 mcg Intravenous Q M,W,F-HD  . ferric gluconate (FERRLECIT/NULECIT) IV  62.5 mg Intravenous Q M,W,F-HD  . FLUoxetine  40 mg Oral q morning - 10a  . levothyroxine  25 mcg Oral q morning - 10a  . multivitamin  1 tablet Oral BID  . mupirocin ointment  1 application Nasal BID  . pantoprazole  40 mg Oral BID AC   Continuous Infusions: . sodium chloride Stopped (02/14/13 1008)   PRN Meds:.HYDROcodone-acetaminophen, sodium chloride  Antibiotics: Anti-infectives   None       PHYSICAL EXAM: Vital signs in last 24 hours: Filed Vitals:   02/15/13 1115 02/15/13 1130 02/15/13 1143 02/15/13 1218  BP: 123/72 136/90 139/83 96/56  Pulse: 119 118 116 118  Temp: 98.7 F (37.1 C) 98.3 F (36.8 C) 98.5 F (36.9 C) 98.6 F (37 C)  TempSrc: Oral Oral Oral Oral  Resp: 16 14 14 17   Height:      Weight:   65.2 kg (143 lb 11.8 oz)   SpO2: 96%  98% 97%    Weight change:  Filed Weights   02/15/13 0500 02/15/13 0712 02/15/13 1143  Weight: 63.8 kg (140 lb 10.5 oz) 66.3 kg (146 lb 2.6 oz) 65.2 kg (143  lb 11.8 oz)   Body mass index is 21.22 kg/(m^2).   Gen Exam: Awake, will not speak but follows commands Neck: Supple, No JVD.   Chest: B/L Clear.   CVS: S1 S2 Regular, no murmurs.  Abdomen: soft, BS +, non tender, non distended.  Extremities: Status post left AKA Neurologic: Non Focal.   Skin: No Rash.   Wounds: N/A.   Intake/Output from previous day:  Intake/Output Summary (Last 24 hours) at 02/15/13 1506 Last data filed at 02/15/13 1143  Gross per 24 hour  Intake    770 ml  Output   1000 ml  Net   -230 ml     LAB RESULTS: CBC  Recent Labs Lab 02/12/13 2224  02/14/13 0214 02/14/13 1012  02/14/13 1812 02/15/13 0241 02/15/13 1245  WBC 19.7*  < > 14.7* 15.4* 11.5* 9.3 9.3  HGB 11.2*  < > 8.3* 8.1* 7.6* 7.4* 11.3*  HCT 36.3  < > 26.9* 26.4* 25.1* 23.2* 34.4*  PLT 414*  < > 344 322 302 275 204  MCV 100.0  < > 98.9 100.0 100.8* 99.1 95.8  MCH 30.9  < > 30.5 30.7 30.5 31.6 31.5  MCHC 30.9  < > 30.9 30.7 30.3 31.9 32.8  RDW 17.1*  < > 17.3* 17.6* 17.7* 17.5* 16.0*  LYMPHSABS 1.1  --   --   --   --   --   --   MONOABS 1.3*  --   --   --   --   --   --   EOSABS 0.1  --   --   --   --   --   --   BASOSABS 0.1  --   --   --   --   --   --   < > = values in this interval not displayed.  Chemistries   Recent Labs Lab 02/12/13 2224 02/13/13 1012 02/14/13 0309 02/15/13 0240 02/15/13 1245  NA 140 143 141 140  --   K 3.7 3.3* 3.6* 3.6*  --   CL 94* 103 107 108  --   CO2 27 25 21 22   --   GLUCOSE 161* 104* 163* 97 73  BUN 12 16 18 21   --   CREATININE 1.61* 2.09* 2.51* 3.43*  --   CALCIUM 9.9 8.8 8.9 9.2  --   MG  --  1.8  --   --   --     CBG:  Recent Labs Lab 02/14/13 1113 02/14/13 1628 02/14/13 2003 02/14/13 2352 02/15/13 0418  GLUCAP 127* 163* 135* 102* 81    GFR Estimated Creatinine Clearance: 13.9 ml/min (by C-G formula based on Cr of 3.43).  Coagulation profile  Recent Labs Lab 02/12/13 2224 02/13/13 1012  INR 1.41 1.53*    Cardiac Enzymes No results found for this basename: CK, CKMB, TROPONINI, MYOGLOBIN,  in the last 168 hours  No components found with this basename: POCBNP,  No results found for this basename: DDIMER,  in the last 72 hours No results found for this basename: HGBA1C,  in the last 72 hours No results found for this basename: CHOL, HDL, LDLCALC, TRIG, CHOLHDL, LDLDIRECT,  in the last 72 hours No results found for this basename: TSH, T4TOTAL, FREET3, T3FREE, THYROIDAB,  in the last 72 hours No results found for this basename: VITAMINB12, FOLATE, FERRITIN, TIBC, IRON, RETICCTPCT,  in the last 72 hours  Recent Labs   02/13/13 1012  LIPASE 15    Urine  Studies No results found for this basename: UACOL, UAPR, USPG, UPH, UTP, UGL, UKET, UBIL, UHGB, UNIT, UROB, ULEU, UEPI, UWBC, URBC, UBAC, CAST, CRYS, UCOM, BILUA,  in the last 72 hours  MICROBIOLOGY: Recent Results (from the past 240 hour(s))  MRSA PCR SCREENING     Status: Abnormal   Collection Time    02/13/13  5:37 AM      Result Value Range Status   MRSA by PCR POSITIVE (*) NEGATIVE Final   Comment:            The GeneXpert MRSA Assay (FDA     approved for NASAL specimens     only), is one component of a     comprehensive MRSA colonization     surveillance program. It is not     intended to diagnose MRSA     infection nor to guide or     monitor treatment for     MRSA infections.     RESULT CALLED TO, READ BACK BY AND VERIFIED WITH:     CALLED TO RN DON HOVANDER [id number] @[phone]  THANEY    RADIOLOGY STUDIES/RESULTS: Ct Head Wo Contrast  02/15/2013   CLINICAL DATA:  Acute mental status change  EXAM: CT HEAD WITHOUT CONTRAST  TECHNIQUE: Contiguous axial images were obtained from the base of the skull through the vertex without intravenous contrast.  COMPARISON:  MR 06/04/2012 and earlier studies  FINDINGS: Atherosclerotic and physiologic intracranial calcifications. Diffuse parenchymal atrophy. Patchy areas of hypoattenuation in deep and periventricular white matter bilaterally. Stable lacunar infarcts in the left internal capsule and caudate nucleus. Negative for acute intracranial hemorrhage, mass lesion, acute infarction, midline shift, or mass-effect. Acute infarct may be inapparent on noncontrast CT. Ventricles and sulci symmetric. Bone windows demonstrate no focal lesion.  IMPRESSION: 1. Negative for bleed or other acute intracranial process. 2. Stable atrophy, left lacunar infarcts, and nonspecific white matter changes.   Electronically Signed   By: Arne Cleveland M.D.   On: 02/15/2013 10:00   Dg Chest Port 1 View  02/14/2013   CLINICAL DATA:   Edema, renal failure  EXAM: PORTABLE CHEST - 1 VIEW  COMPARISON:  02/13/2013  FINDINGS: Right IJ central line tip at the SVC RA junction level. Stable cardiomegaly with vascular congestion. No current edema. No new collapse or consolidation. Negative for effusion or pneumothorax. Atherosclerosis of the thoracic aorta. Degenerative changes of the spine.  IMPRESSION: Cardiomegaly with vascular congestion.  Stable exam.   Electronically Signed   By: Daryll Brod M.D.   On: 02/14/2013 07:21   Dg Chest Port 1 View  02/13/2013   CLINICAL DATA:  Central line placement.  EXAM: PORTABLE CHEST - 1 VIEW  COMPARISON:  Single view of the chest 06/03/2012.  FINDINGS: Right IJ catheter is in place with the tip projecting over the lower superior vena cava. No pneumothorax. Lungs are clear. Heart size is enlarged. No pleural effusion is identified.  IMPRESSION: Tip of right IJ catheter projects over the lower superior vena cava. No pneumothorax.  Cardiomegaly without edema.   Electronically Signed   By: Inge Rise M.D.   On: 02/13/2013 02:25    Oren Binet, MD  [facility] Pager:[phone]  If 7PM-7AM, please contact night-coverage [email or web address] Password [email or web address] 02/15/2013, 3:06 PM   LOS: 3 days

## 2013-02-16 ENCOUNTER — Encounter (HOSPITAL_COMMUNITY): Payer: Self-pay | Admitting: Gastroenterology

## 2013-02-16 LAB — CBC
HCT: 33.3 % — ABNORMAL LOW (ref 36.0–46.0)
HEMATOCRIT: 33.1 % — AB (ref 36.0–46.0)
HEMOGLOBIN: 10.9 g/dL — AB (ref 12.0–15.0)
HEMOGLOBIN: 10.8 g/dL — AB (ref 12.0–15.0)
MCH: 31.3 pg (ref 26.0–34.0)
MCH: 31.6 pg (ref 26.0–34.0)
MCHC: 32.7 g/dL (ref 30.0–36.0)
MCHC: 32.6 g/dL (ref 30.0–36.0)
MCV: 95.7 fL (ref 78.0–100.0)
MCV: 96.8 fL (ref 78.0–100.0)
Platelets: 212 10*3/uL (ref 150–400)
Platelets: 208 10*3/uL (ref 150–400)
RBC: 3.48 MIL/uL — AB (ref 3.87–5.11)
RBC: 3.42 MIL/uL — ABNORMAL LOW (ref 3.87–5.11)
RDW: 16.6 % — ABNORMAL HIGH (ref 11.5–15.5)
RDW: 16.6 % — ABNORMAL HIGH (ref 11.5–15.5)
WBC: 9 10*3/uL (ref 4.0–10.5)
WBC: 9.4 10*3/uL (ref 4.0–10.5)

## 2013-02-16 LAB — BASIC METABOLIC PANEL
BUN: 10 mg/dL (ref 6–23)
CO2: 26 mEq/L (ref 19–32)
Calcium: 8.7 mg/dL (ref 8.4–10.5)
Chloride: 97 mEq/L (ref 96–112)
Creatinine, Ser: 2.21 mg/dL — ABNORMAL HIGH (ref 0.50–1.10)
GFR calc Af Amer: 23 mL/min — ABNORMAL LOW (ref >90)
GFR calc non Af Amer: 20 mL/min — ABNORMAL LOW (ref >90)
Glucose, Bld: 76 mg/dL (ref 70–99)
Potassium: 4.2 mEq/L (ref 3.7–5.3)
Sodium: 134 mEq/L — ABNORMAL LOW (ref 137–147)

## 2013-02-16 LAB — GLUCOSE, CAPILLARY
GLUCOSE-CAPILLARY: 130 mg/dL — AB (ref 70–99)
Glucose-Capillary: 104 mg/dL — ABNORMAL HIGH (ref 70–99)
Glucose-Capillary: 112 mg/dL — ABNORMAL HIGH (ref 70–99)
Glucose-Capillary: 120 mg/dL — ABNORMAL HIGH (ref 70–99)
Glucose-Capillary: 83 mg/dL (ref 70–99)

## 2013-02-16 LAB — TYPE AND SCREEN
ABO/RH(D): O POS
Antibody Screen: NEGATIVE
UNIT DIVISION: 0
Unit division: 0

## 2013-02-16 MED ORDER — BOOST / RESOURCE BREEZE PO LIQD
1.0000 | ORAL | Status: DC
  Administered 2013-02-17: 1 via ORAL

## 2013-02-16 MED ORDER — NEPRO/CARBSTEADY PO LIQD: 237.0000 mL | Freq: Two times a day (BID) | ORAL | Status: DC

## 2013-02-16 MED ORDER — NEPRO/CARBSTEADY PO LIQD
237.0000 mL | ORAL | Status: DC
  Administered 2013-02-16: 237 mL via ORAL

## 2013-02-16 NOTE — Progress Notes (Addendum)
INITIAL NUTRITION ASSESSMENT  DOCUMENTATION CODES Per approved criteria  -Severe malnutrition in the context of chronic illness   INTERVENTION: Agree with diet liberalization to Regular. Add Nepro Shake po daily, each supplement provides 425 kcal and 19 grams protein. Add Resource Breeze po daily, each supplement provides 250 kcal and 9 grams of protein. Monitor magnesium, potassium, and phosphorus daily for at least 3 days, MD to replete as needed, as pt is at risk for refeeding syndrome given severe malnutrition. RD to continue to follow nutrition care plan.  NUTRITION DIAGNOSIS: Inadequate oral intake related to lethargy and poor oral intake as evidenced by poor meal completion and weight loss.   Goal: Intake to meet >90% of estimated nutrition needs.  Monitor:  weight trends, lab trends, I/O's, PO intake, supplement tolerance  Reason for Assessment: Low Braden Score  80 y.o. female  Admitting Dx: GI bleed  ASSESSMENT: PMHx significant for ESRD (HD on MWF), DM, severe PAD s/p recent L AKA, dementia. Recently discharged from OSH on 1/8 after prolonged stay for critical LLE ischemia, requiring L AKA. Admitted with poor PO intake, melena, and AMS. Work-up reveals hypovolemia s/p HD and upper GIB.  Underwent endoscopy 1/11, pt with gastritis and duodenitis, biopsies pending. Pt does not speak at times.  Per renal note, EDW was 65 kg before AKA, however post-HD weight yesterday was 56.5 kg. This is a weight change of 13%, which includes an estimated and expected 8% from AKA limb loss. Thus, pt with 5% wt loss of body weight x <1 month which is significant.  Daughter at bedside notes that pt is eating very poorly and has been since Dec 24th (admission to [facility].) Pt slept "a lot" during her previous hospitalization and was even limited to a pureed diet at one point that limited her intake even more, as pt hated pureed textures. Pt will take Nepro, per daughter. Pt also has been  have low CBGs 2/2 poor oral intake, pt may also do well with [employer], discussed with daughter. Discussed diet liberalization with daughter and renal PA, who are both in agreement to diet liberalization.  Pt was dx with severe malnutrition in the context of chronic illness in May 2014 2/2 weight loss x 1 month and poor oral intake. This diagnosis continues as pt has lost an additional 5% body weight x 1 month and intake has been <75% x at least 1 month. Pt also with severe muscle mass loss. Pt is at some level of refeeding risk 2/2 prolonged poor intake during lengthy recent hospitalization. Potassium low on admit, now WNL. Magnesium and phosphorus are WNL.  Nutrition Focused Physical Exam:  Subcutaneous Fat:  Orbital Region: moderate depletion Upper Arm Region: moderate depletion Thoracic and Lumbar Region: WNL  Muscle:  Temple Region: severe depletion Clavicle Bone Region: moderate depletion Clavicle and Acromion Bone Region: n/a Scapular Bone Region: n/a Dorsal Hand: n/a Patellar Region: n/a Anterior Thigh Region: n/a Posterior Calf Region: n/a  Edema: mild edema     Height: Ht Readings from Last 1 Encounters:  02/13/13 5\' 9"  (1.753 m)    Weight: Wt Readings from Last 1 Encounters:  02/15/13 145 lb 8.1 oz (66 kg)    Ideal Body Weight: 133 lb (60.6 kg) - adjusted for AKA  % Ideal Body Weight: 109%  Wt Readings from Last 10 Encounters:  02/15/13 145 lb 8.1 oz (66 kg)  02/15/13 145 lb 8.1 oz (66 kg)  01/27/13 154 lb 5.2 oz (70 kg)  12/10/12  150 lb 0.6 oz (68.058 kg)  11/24/12 143 lb 4.8 oz (65 kg)  07/23/12 153 lb (69.4 kg)  06/11/12 151 lb (68.493 kg)  06/05/12 151 lb 7.3 oz (68.7 kg)  05/30/12 151 lb (68.493 kg)  04/24/12 170 lb (77.111 kg)    Usual Body Weight: 65 kg (dry weight, per renal prior to AKA)   % Usual Body Weight: ---  BMI:  20 - using new dry weight of 56.5 kg and adjusting for AKA  Estimated Nutritional Needs: Kcal: 1800 -  2000 Protein: 80 - 95 g Fluid: 1.2 liters  Skin: stage II to R and L foot  Diet Order: Renal  EDUCATION NEEDS: -No education needs identified at this time   Intake/Output Summary (Last 24 hours) at 02/16/13 1124 Last data filed at 02/15/13 1833  Gross per 24 hour  Intake    180 ml  Output   1000 ml  Net   -820 ml    Last BM: 1/11  Labs:   Recent Labs Lab 02/13/13 1012 02/14/13 0309 02/15/13 0240 02/15/13 1245 02/16/13 0535  NA 143 141 140  --  134*  K 3.3* 3.6* 3.6*  --  4.2  CL 103 107 108  --  97  CO2 25 21 22   --  26  BUN 16 18 21   --  10  CREATININE 2.09* 2.51* 3.43*  --  2.21*  CALCIUM 8.8 8.9 9.2  --  8.7  MG 1.8  --   --   --   --   PHOS 4.3  --  3.9  --   --   GLUCOSE 104* 163* 97 73 76    CBG (last 3)   Recent Labs  02/15/13 1657 02/15/13 2156 02/16/13 0802  GLUCAP 78 88 83    Scheduled Meds: . antiseptic oral rinse  15 mL Mouth Rinse BID  . atorvastatin  10 mg Oral q morning - 10a  . calcium acetate  667 mg Oral TID WC  . carbidopa-levodopa  1.5 tablet Oral TID  . carvedilol  12.5 mg Oral BID WC  . Chlorhexidine Gluconate Cloth  6 each Topical Q0600  . cinacalcet  30 mg Oral Q supper  . darbepoetin (ARANESP) injection - DIALYSIS  40 mcg Intravenous Q Mon-HD  . donepezil  10 mg Oral QHS  . doxercalciferol  2 mcg Intravenous Q M,W,F-HD  . ferric gluconate (FERRLECIT/NULECIT) IV  62.5 mg Intravenous Q M,W,F-HD  . FLUoxetine  40 mg Oral q morning - 10a  . insulin aspart  0-9 Units Subcutaneous TID WC  . levothyroxine  25 mcg Oral q morning - 10a  . multivitamin  1 tablet Oral BID  . mupirocin ointment  1 application Nasal BID  . pantoprazole  40 mg Oral BID AC    Continuous Infusions: . sodium chloride Stopped (02/14/13 1008)    Past Medical History  Diagnosis Date  . Hyperlipidemia     takes Lipitor daily  . DVT (deep venous thrombosis)   . Stroke   . Parkinson's disease     takes Sinemet daily  . Tibia/fibula fracture  01/04/2012    Treated with long leg casting. Cast change in hospital 02/27/12   . Peripheral vascular disease 02/27/2012    Skin ulcer of right great toe  . Atrial fibrillation 03/09/2012    Seen on older EKG as well but family not aware.   . Macrocytic anemia 03/09/2012  . Lower GI bleed 03/10/2012    hematochezia  secondary to ulcerated polyps; diverticulosis; chronic diarrhea  . End stage renal disease on dialysis     [facility]- MWF  . Hypertension     takes Coreg daily  . Dementia     takes Aricept daily  . Diabetes mellitus, type II     takes Lantus and Novolog  . Seasonal allergies     takes Allegra daily  . Depression     takes Prozac daily  . Hypothyroidism     takes Synthroid daily    Past Surgical History  Procedure Laterality Date  . Dg av dialysis graft declot or    . Av fistula repair  Aug. 2013    Left arm  . Cholecystectomy    . Colon surgery      Aprrox 2006 at [facility] after she had a colonoscopy  . Colonoscopy  03/10/2012    Procedure: COLONOSCOPY;  Surgeon: Rogene Houston, MD;  Location: [facility];  Service: Endoscopy;  Laterality: N/A;  . Esophagogastroduodenoscopy  03/10/2012    Procedure: ESOPHAGOGASTRODUODENOSCOPY (EGD);  Surgeon: Rogene Houston, MD;  Location: [facility];  Service: Endoscopy;  Laterality: N/A;  EGD if TCS normal.  . Ligation of arteriovenous  fistula Left 03/24/2012    Procedure: LIGATION OF ARTERIOVENOUS  FISTULA;  Surgeon: Conrad Purcell, MD;  Location: [facility];  Service: Vascular;  Laterality: Left;  . Av fistula placement Right 04/28/2012    Procedure: INSERTION OF ARTERIOVENOUS (AV) GORE-TEX GRAFT ARM;  Surgeon: Elam Dutch, MD;  Location: [facility] OR;  Service: Vascular;  Laterality: Right;  . Leg amputation above knee Left   . Angioplast of lle  12/2012-01/2013    Several Procedures  . Esophagogastroduodenoscopy N/A 02/14/2013    Procedure: ESOPHAGOGASTRODUODENOSCOPY (EGD);  Surgeon: Milus Banister, MD;  Location: [facility];   Service: Endoscopy;  Laterality: N/A;    Inda Coke MS, RD, LDN Pager: [phone] After-hours pager: [phone]

## 2013-02-16 NOTE — Progress Notes (Addendum)
PATIENT DETAILS Name: Katrina Meyer Age: 80 y.o. Sex: female Date of Birth: 03-Jul-1932 Admit Date: 02/12/2013 Admitting Physician Chesley Mires, MD HYI:FOYD,XAJOI B., MD  Subjective: Does not speak-but follows commands. This M.D., had spoken with the daughter on admission at [facility] is patient's baseline. Her daughter at bedside this morning, no major issues overnight.  Assessment/Plan: Principal Problem:   GI bleed - Secondary to gastritis/duodenitis - EGD on 02/14/13, biopsies pending - Recommendations from gastroenterology forPPI twice a day for next 2 months, and then daily.  - No further melanotic stool observed.   Hypovolemic/hemorrhagic shock  - Secondary to above and fluid removal post dialysis. Resolved with IV fluids and resolution of GI bleed.  Acute blood loss anemia  - Transfused 2 units of PRBC on 1/12 during dialysis, recheck CBC in a.m.  - Monitor CBC for another 24 hours, if stable, suspect can be discharged on 1/14  Atrial fibrillation - Had rapid ventricular response on admission - Currently rate controlled with Coreg - Not anticoagulation candidate -? Aspirin if okay with GI    End stage renal disease on dialysis - Per nephrology     Diabetes mellitus, type II - CBGs stable  - c/w SSI  Hypertension - Continue Coreg  Hypothyroidism - Continue levothyroxine   History of peripheral vascular disease- is post recent left AKA - Staples intact, would clean. - Given GI bleed with hemorrhagic shock-aspirin and Plavix on hold - Have placed a call to Dr. Iona Beard Adams-patient's cardiologist at [facility], awaiting call back. Apparently patient has had numerous angiographic procedures and stenting done on her right leg, and right upper extremity. We need to notify patient's primary cardiologist regarding inability to continue aspirin and Plavix in the setting of hemorrhagic shock from upper GI bleed.  History of Parkinson's disease - Continue  Sinemet  Right foot venous stasis ulcer -1X2cm sacral ulcer w/ dressing in place.  - Per wound care    Dementia - On Aricept  Deconditioning -Needs PT eval-prior to discharge-have ordered  Disposition: Remain inpatient- suspect home on 1/14 if hemoglobin remains stable  DVT Prophylaxis: SCD's  Code Status: Full code  Family Communication Daughter at bedside  Procedures:   EGD 1/11  CONSULTS:  GI    MEDICATIONS: Scheduled Meds: . antiseptic oral rinse  15 mL Mouth Rinse BID  . atorvastatin  10 mg Oral q morning - 10a  . calcium acetate  667 mg Oral TID WC  . carbidopa-levodopa  1.5 tablet Oral TID  . carvedilol  12.5 mg Oral BID WC  . Chlorhexidine Gluconate Cloth  6 each Topical Q0600  . cinacalcet  30 mg Oral Q supper  . darbepoetin (ARANESP) injection - DIALYSIS  40 mcg Intravenous Q Mon-HD  . donepezil  10 mg Oral QHS  . doxercalciferol  2 mcg Intravenous Q M,W,F-HD  . feeding supplement (NEPRO CARB STEADY)  237 mL Oral Q24H  . [START ON 02/17/2013] feeding supplement (RESOURCE BREEZE)  1 Container Oral Q24H  . ferric gluconate (FERRLECIT/NULECIT) IV  62.5 mg Intravenous Q M,W,F-HD  . FLUoxetine  40 mg Oral q morning - 10a  . insulin aspart  0-9 Units Subcutaneous TID WC  . levothyroxine  25 mcg Oral q morning - 10a  . multivitamin  1 tablet Oral BID  . mupirocin ointment  1 application Nasal BID  . pantoprazole  40 mg Oral BID AC   Continuous Infusions: . sodium chloride Stopped (02/14/13 1008)   PRN Meds:.HYDROcodone-acetaminophen, sodium chloride  Antibiotics: Anti-infectives   None       PHYSICAL EXAM: Vital signs in last 24 hours: Filed Vitals:   02/15/13 2209 02/16/13 0534 02/16/13 0804 02/16/13 1200  BP: 114/73 130/78 126/78 118/68  Pulse: 83 100 112 99  Temp: 98.8 F (37.1 C) 98.5 F (36.9 C) 98.1 F (36.7 C) 98.1 F (36.7 C)  TempSrc: Oral Oral Oral Oral  Resp: 18 18 19 18   Height:      Weight: 66 kg (145 lb 8.1 oz)      SpO2: 100% 99% 98% 98%    Weight change: 4.2 kg (9 lb 4.2 oz) Filed Weights   02/15/13 0712 02/15/13 1143 02/15/13 2209  Weight: 66.3 kg (146 lb 2.6 oz) 65.2 kg (143 lb 11.8 oz) 66 kg (145 lb 8.1 oz)   Body mass index is 21.48 kg/(m^2).   Gen Exam: Awake, will not speak but follows commands Neck: Supple, No JVD.   Chest: B/L Clear.   CVS: S1 S2 Regular, no murmurs.  Abdomen: soft, BS +, non tender, non distended.  Extremities: Status post left AKA Neurologic: Non Focal.   Skin: No Rash.   Wounds: N/A.   Intake/Output from previous day:  Intake/Output Summary (Last 24 hours) at 02/16/13 1522 Last data filed at 02/16/13 1405  Gross per 24 hour  Intake    300 ml  Output      0 ml  Net    300 ml     LAB RESULTS: CBC  Recent Labs Lab 02/12/13 2224  02/15/13 0241 02/15/13 1245 02/15/13 1812 02/16/13 0200 02/16/13 1250  WBC 19.7*  < > 9.3 9.3 8.4 9.0 9.4  HGB 11.2*  < > 7.4* 11.3* 10.6* 10.9* 10.8*  HCT 36.3  < > 23.2* 34.4* 32.9* 33.3* 33.1*  PLT 414*  < > 275 204 213 212 208  MCV 100.0  < > 99.1 95.8 97.9 95.7 96.8  MCH 30.9  < > 31.6 31.5 31.5 31.3 31.6  MCHC 30.9  < > 31.9 32.8 32.2 32.7 32.6  RDW 17.1*  < > 17.5* 16.0* 16.5* 16.6* 16.6*  LYMPHSABS 1.1  --   --   --   --   --   --   MONOABS 1.3*  --   --   --   --   --   --   EOSABS 0.1  --   --   --   --   --   --   BASOSABS 0.1  --   --   --   --   --   --   < > = values in this interval not displayed.  Chemistries   Recent Labs Lab 02/12/13 2224 02/13/13 1012 02/14/13 0309 02/15/13 0240 02/15/13 1245 02/16/13 0535  NA 140 143 141 140  --  134*  K 3.7 3.3* 3.6* 3.6*  --  4.2  CL 94* 103 107 108  --  97  CO2 27 25 21 22   --  26  GLUCOSE 161* 104* 163* 97 73 76  BUN 12 16 18 21   --  10  CREATININE 1.61* 2.09* 2.51* 3.43*  --  2.21*  CALCIUM 9.9 8.8 8.9 9.2  --  8.7  MG  --  1.8  --   --   --   --     CBG:  Recent Labs Lab 02/15/13 0418 02/15/13 1657 02/15/13 2156 02/16/13 0802  02/16/13 1159  GLUCAP 81 78 88 83 120*  GFR Estimated Creatinine Clearance: 21.9 ml/min (by C-G formula based on Cr of 2.21).  Coagulation profile  Recent Labs Lab 02/12/13 2224 02/13/13 1012  INR 1.41 1.53*    Cardiac Enzymes No results found for this basename: CK, CKMB, TROPONINI, MYOGLOBIN,  in the last 168 hours  No components found with this basename: POCBNP,  No results found for this basename: DDIMER,  in the last 72 hours No results found for this basename: HGBA1C,  in the last 72 hours No results found for this basename: CHOL, HDL, LDLCALC, TRIG, CHOLHDL, LDLDIRECT,  in the last 72 hours No results found for this basename: TSH, T4TOTAL, FREET3, T3FREE, THYROIDAB,  in the last 72 hours No results found for this basename: VITAMINB12, FOLATE, FERRITIN, TIBC, IRON, RETICCTPCT,  in the last 72 hours No results found for this basename: LIPASE, AMYLASE,  in the last 72 hours  Urine Studies No results found for this basename: UACOL, UAPR, USPG, UPH, UTP, UGL, UKET, UBIL, UHGB, UNIT, UROB, ULEU, UEPI, UWBC, URBC, UBAC, CAST, CRYS, UCOM, BILUA,  in the last 72 hours  MICROBIOLOGY: Recent Results (from the past 240 hour(s))  MRSA PCR SCREENING     Status: Abnormal   Collection Time    02/13/13  5:37 AM      Result Value Range Status   MRSA by PCR POSITIVE (*) NEGATIVE Final   Comment:            The GeneXpert MRSA Assay (FDA     approved for NASAL specimens     only), is one component of a     comprehensive MRSA colonization     surveillance program. It is not     intended to diagnose MRSA     infection nor to guide or     monitor treatment for     MRSA infections.     RESULT CALLED TO, READ BACK BY AND VERIFIED WITH:     CALLED TO RN DON HOVANDER PY:[phone] @[phone]  THANEY    RADIOLOGY STUDIES/RESULTS: Ct Head Wo Contrast  02/15/2013   CLINICAL DATA:  Acute mental status change  EXAM: CT HEAD WITHOUT CONTRAST  TECHNIQUE: Contiguous axial images were obtained from the  base of the skull through the vertex without intravenous contrast.  COMPARISON:  MR 06/04/2012 and earlier studies  FINDINGS: Atherosclerotic and physiologic intracranial calcifications. Diffuse parenchymal atrophy. Patchy areas of hypoattenuation in deep and periventricular white matter bilaterally. Stable lacunar infarcts in the left internal capsule and caudate nucleus. Negative for acute intracranial hemorrhage, mass lesion, acute infarction, midline shift, or mass-effect. Acute infarct may be inapparent on noncontrast CT. Ventricles and sulci symmetric. Bone windows demonstrate no focal lesion.  IMPRESSION: 1. Negative for bleed or other acute intracranial process. 2. Stable atrophy, left lacunar infarcts, and nonspecific white matter changes.   Electronically Signed   By: Arne Cleveland M.D.   On: 02/15/2013 10:00   Dg Chest Port 1 View  02/14/2013   CLINICAL DATA:  Edema, renal failure  EXAM: PORTABLE CHEST - 1 VIEW  COMPARISON:  02/13/2013  FINDINGS: Right IJ central line tip at the SVC RA junction level. Stable cardiomegaly with vascular congestion. No current edema. No new collapse or consolidation. Negative for effusion or pneumothorax. Atherosclerosis of the thoracic aorta. Degenerative changes of the spine.  IMPRESSION: Cardiomegaly with vascular congestion.  Stable exam.   Electronically Signed   By: Daryll Brod M.D.   On: 02/14/2013 07:21   Dg Chest [facility] 1 [address]  02/13/2013   CLINICAL DATA:  Central line placement.  EXAM: PORTABLE CHEST - 1 VIEW  COMPARISON:  Single view of the chest 06/03/2012.  FINDINGS: Right IJ catheter is in place with the tip projecting over the lower superior vena cava. No pneumothorax. Lungs are clear. Heart size is enlarged. No pleural effusion is identified.  IMPRESSION: Tip of right IJ catheter projects over the lower superior vena cava. No pneumothorax.  Cardiomegaly without edema.   Electronically Signed   By: Inge Rise M.D.   On: 02/13/2013 02:25     Oren Binet, MD  [facility] Pager:[phone]  If 7PM-7AM, please contact night-coverage [email or web address] Password [email or web address] 02/16/2013, 3:22 PM   LOS: 4 days

## 2013-02-16 NOTE — Progress Notes (Signed)
[facility] Progress Note  Subjective:   Daughter [name] with pt today. Eating small amounts mostly non protein foods. Wonders if her mother is depressed.  Objective Filed Vitals:   02/15/13 1700 02/15/13 2209 02/16/13 0534 02/16/13 0804  BP: 120/87 114/73 130/78 126/78  Pulse: 109 83 100 112  Temp: 99.5 F (37.5 C) 98.8 F (37.1 C) 98.5 F (36.9 C) 98.1 F (36.7 C)  TempSrc: Axillary Oral Oral Oral  Resp: 18 18 18 19   Height:      Weight:  66 kg (145 lb 8.1 oz)    SpO2: 97% 100% 99% 98%   Physical Exam General: eyes open - nonverbal Heart: tachy irreg Lungs: dim BS no rales Abdomen: soft NT; mild dependent edema Extremities: tr right LE edema with SCDs; heel wrapped in dressing; left AKA with high protective brace/shrinker Dialysis Access: Right upper AVGG + bruit  Dialysis Orders: Center: [facility] on MWF .  EDW was 65 before AKA .kg HD Bath 2.0 k, 2.25ca Time 3hrs 44min Heparin 2000. Access [id number] BFR 300 DFR 800 Hectorol 2 mcg IV/HD Epogen 4800 Units IV/HD Venofer 100 mg Needs 9 doses   Assessment/Plan:  1. Hypotension/volume - BP better ? due toGi bleed/ InfecSp AKA / tx 2 units yesterday may have helped; Net UF 1 L yesterday with post weight of 65.2; EDW 65 BEFORE AKA; ?? Wt error - has not been drinking that much and does not appear to have a large amount of dependent edema 2. ESRD= HD MWF K3.6 - use 4 K bath continue no hep on hd for now - HD in am 3. GI Bleed= Endo showed mod gastritis and duodenitis bx pending - GI rec bid PPI x 2 months then q day; off plavix; primary to clarify if she should be on asa;  4. Anemia - Hgb 9.0 > 7.4 in 7s x 2 days - transfused 2 units 1/12  Up to 11.3 > 10.9 - aranesp 40 q Mond and continue IV Fe 5. Metabolic bone disease - Vit d on hd / binders when pos 6. PVD sp recent L AKA d/c from [facility] on 1/8 following a prolonged stay for critical LLE ischemia with subsequent left AKA 7. A. Fib - BP variable -HR 110 - 120 - on carvedilol  12.5 bid but need to keep BP up so she can get med. 8. Dementia/Parkinson's - often nonverbal at baseline 9. Nutrition - intake poor Alb 1.6 - change diet to regular and add nepro 10. Depression - on prozac 40; hard to assess -would expect more situation depression following AKA;  I think d/c to home surroundings and support systems will be helpful; d/w daughter 48. Hypothyroidism - on synthroid 12. Disp - possible d/c post HD [place], PA-C [place] [phone] 02/16/2013,11:10 AM  LOS: 4 days   I have seen and examined patient, discussed with PA and agree with assessment and plan as outlined above.  Stable from renal standpoint.  Kelly Splinter MD pager [phone]    cell [phone] 02/16/2013, 12:28 PM    Additional Objective Labs: Basic Metabolic Panel:  Recent Labs Lab 02/13/13 1012 02/14/13 0309 02/15/13 0240 02/15/13 1245 02/16/13 0535  NA 143 141 140  --  134*  K 3.3* 3.6* 3.6*  --  4.2  CL 103 107 108  --  97  CO2 25 21 22   --  26  GLUCOSE 104* 163* 97 73 76  BUN 16 18 21   --  10  CREATININE 2.09* 2.51* 3.43*  --  2.21*  CALCIUM 8.8 8.9 9.2  --  8.7  PHOS 4.3  --  3.9  --   --    Liver Function Tests:  Recent Labs Lab 02/12/13 2224 02/13/13 1012 02/15/13 0240  AST 20 12  --   ALT <5 <5  --   ALKPHOS 120* 90  --   BILITOT 0.5 0.2*  --   PROT 7.1 5.3*  --   ALBUMIN 2.2* 1.7* 1.6*    Recent Labs Lab 02/13/13 1012  LIPASE 15   CBC:  Recent Labs Lab 02/12/13 2224  02/14/13 1812 02/15/13 0241 02/15/13 1245 02/15/13 1812 02/16/13 0200  WBC 19.7*  < > 11.5* 9.3 9.3 8.4 9.0  NEUTROABS 17.1*  --   --   --   --   --   --   HGB 11.2*  < > 7.6* 7.4* 11.3* 10.6* 10.9*  HCT 36.3  < > 25.1* 23.2* 34.4* 32.9* 33.3*  MCV 100.0  < > 100.8* 99.1 95.8 97.9 95.7  PLT 414*  < > 302 275 204 213 212  < > = values in this interval not displayed. CBG:  Recent Labs Lab 02/14/13 2352 02/15/13 0418 02/15/13 1657  02/15/13 2156 02/16/13 0802  GLUCAP 102* 81 78 88 83  Medications: . sodium chloride Stopped (02/14/13 1008)   . antiseptic oral rinse  15 mL Mouth Rinse BID  . atorvastatin  10 mg Oral q morning - 10a  . calcium acetate  667 mg Oral TID WC  . carbidopa-levodopa  1.5 tablet Oral TID  . carvedilol  12.5 mg Oral BID WC  . Chlorhexidine Gluconate Cloth  6 each Topical Q0600  . cinacalcet  30 mg Oral Q supper  . darbepoetin (ARANESP) injection - DIALYSIS  40 mcg Intravenous Q Mon-HD  . donepezil  10 mg Oral QHS  . doxercalciferol  2 mcg Intravenous Q M,W,F-HD  . ferric gluconate (FERRLECIT/NULECIT) IV  62.5 mg Intravenous Q M,W,F-HD  . FLUoxetine  40 mg Oral q morning - 10a  . insulin aspart  0-9 Units Subcutaneous TID WC  . levothyroxine  25 mcg Oral q morning - 10a  . multivitamin  1 tablet Oral BID  . mupirocin ointment  1 application Nasal BID  . pantoprazole  40 mg Oral BID AC

## 2013-02-17 DIAGNOSIS — I10 Essential (primary) hypertension: Secondary | ICD-10-CM

## 2013-02-17 DIAGNOSIS — I739 Peripheral vascular disease, unspecified: Secondary | ICD-10-CM

## 2013-02-17 LAB — CBC
HEMATOCRIT: 33.3 % — AB (ref 36.0–46.0)
Hemoglobin: 10.9 g/dL — ABNORMAL LOW (ref 12.0–15.0)
MCH: 31.2 pg (ref 26.0–34.0)
MCHC: 32.7 g/dL (ref 30.0–36.0)
MCV: 95.4 fL (ref 78.0–100.0)
Platelets: 205 10*3/uL (ref 150–400)
RBC: 3.49 MIL/uL — AB (ref 3.87–5.11)
RDW: 16.5 % — ABNORMAL HIGH (ref 11.5–15.5)
WBC: 8.1 10*3/uL (ref 4.0–10.5)

## 2013-02-17 LAB — BASIC METABOLIC PANEL
BUN: 17 mg/dL (ref 6–23)
CO2: 24 mEq/L (ref 19–32)
CREATININE: 3.36 mg/dL — AB (ref 0.50–1.10)
Calcium: 9.1 mg/dL (ref 8.4–10.5)
Chloride: 98 mEq/L (ref 96–112)
GFR calc Af Amer: 14 mL/min — ABNORMAL LOW (ref >90)
GFR calc non Af Amer: 12 mL/min — ABNORMAL LOW (ref >90)
GLUCOSE: 88 mg/dL (ref 70–99)
Potassium: 4.2 mEq/L (ref 3.7–5.3)
SODIUM: 135 meq/L — AB (ref 137–147)

## 2013-02-17 LAB — GLUCOSE, CAPILLARY
GLUCOSE-CAPILLARY: 85 mg/dL (ref 70–99)
GLUCOSE-CAPILLARY: 157 mg/dL — AB (ref 70–99)
Glucose-Capillary: 87 mg/dL (ref 70–99)

## 2013-02-17 MED ORDER — PANTOPRAZOLE SODIUM 40 MG PO TBEC: 40.0000 mg | DELAYED_RELEASE_TABLET | Freq: Two times a day (BID) | ORAL | Status: DC

## 2013-02-17 MED ORDER — SODIUM CHLORIDE 0.9 % IV SOLN: 100.0000 mL | INTRAVENOUS | Status: DC | PRN

## 2013-02-17 MED ORDER — DOXERCALCIFEROL 4 MCG/2ML IV SOLN
INTRAVENOUS | Status: AC
  Administered 2013-02-17: 2 ug via INTRAVENOUS
  Filled 2013-02-17: qty 2

## 2013-02-17 MED ORDER — CARVEDILOL 6.25 MG PO TABS: 18.7500 mg | ORAL_TABLET | Freq: Two times a day (BID) | ORAL | Status: DC

## 2013-02-17 MED ORDER — NEPRO/CARBSTEADY PO LIQD: 237.0000 mL | ORAL | Status: AC

## 2013-02-17 MED ORDER — LIDOCAINE HCL (PF) 1 % IJ SOLN: 5.0000 mL | INTRAMUSCULAR | Status: DC | PRN

## 2013-02-17 MED ORDER — SODIUM CHLORIDE 0.9 % IV SOLN: 62.5000 mg | INTRAVENOUS | Status: DC

## 2013-02-17 MED ORDER — NEPRO/CARBSTEADY PO LIQD: 237.0000 mL | ORAL | Status: DC | PRN

## 2013-02-17 MED ORDER — PENTAFLUOROPROP-TETRAFLUOROETH EX AERO: 1.0000 "application " | INHALATION_SPRAY | CUTANEOUS | Status: DC | PRN

## 2013-02-17 MED ORDER — DARBEPOETIN ALFA-POLYSORBATE 40 MCG/0.4ML IJ SOLN: 40.0000 ug | INTRAMUSCULAR | Status: DC

## 2013-02-17 MED ORDER — CARVEDILOL 6.25 MG PO TABS
18.7500 mg | ORAL_TABLET | Freq: Two times a day (BID) | ORAL | Status: DC
  Filled 2013-02-17 (×2): qty 1

## 2013-02-17 MED ORDER — BOOST / RESOURCE BREEZE PO LIQD: 1.0000 | ORAL | Status: DC

## 2013-02-17 MED ORDER — DOXERCALCIFEROL 4 MCG/2ML IV SOLN: 2.0000 ug | INTRAVENOUS | Status: DC

## 2013-02-17 MED ORDER — LIDOCAINE-PRILOCAINE 2.5-2.5 % EX CREA: 1.0000 "application " | TOPICAL_CREAM | CUTANEOUS | Status: DC | PRN

## 2013-02-17 NOTE — Discharge Summary (Signed)
Physician Discharge Summary  Katrina Meyer [id number] DOB: April 26, 1932 DOA: 02/12/2013  PCP: Glenda Chroman., MD  Admit date: 02/12/2013 Discharge date: 02/17/2013  Time spent: 35 minutes  Recommendations for Outpatient Follow-up:  1. Hold ASA, plavix until follow up with GI 2. CBC, BMP 1 week 3. PPI BID x 1 month then daily 4. HD per nephro 5. Home health PT  Discharge Diagnoses:  Principal Problem:   GI bleed Active Problems:   End stage renal disease on dialysis   Diabetes mellitus, type II   Sacral decubitus ulcer, stage II   Altered mental status   GIB (gastrointestinal bleeding)   Unspecified gastritis and gastroduodenitis without mention of hemorrhage   Hiatal hernia   Discharge Condition: improved  Diet recommendation: carb mod/renal diet  Filed Weights   02/16/13 2030 02/17/13 0740 02/17/13 1114  Weight: 65.409 kg (144 lb 3.2 oz) 65.4 kg (144 lb 2.9 oz) 64.5 kg (142 lb 3.2 oz)    History of present illness:  80 year old African American female w/ past medical history of end-stage renal disease on hemodialysis Mondays/Wednesdays and Fridays, severe peripheral vascular disease, diabetes, atrial fibrillation not on any anticoagulation, and diabetes. She apparently was discharged from [facility] on 1/8 after having a prolonged hospital stay for critical left lower extremity ischemia which required a left AKA. Review of discharge medications, shows that she was discharged on aspirin and Plavix (but plavix not on MAR). Presented to ED 1/9, w/cc: decreased PO intake, and increased lethargy/decreased responsiveness and subsequent syncope on she was having a large bloody bowel movement. Initially it was bloody and then subsequently became melanotic. While in ED stay she started to become hypotensive in spite of 1.5 L fluid bolus, this was c/b atrial fibrillation with rapid ventricular response. Followed by one further episode of a large melanotic stool while in the emergency  room. She was initially seen by hospitalist but given hypotension PCCM was asked to admit.    Hospital Course:  GI bleed  - Secondary to gastritis/duodenitis  - EGD on 02/14/13, biopsies pending  - Recommendations from gastroenterology for PPI twice a day for next 2 months, and then daily.  - No further melanotic stool observed.   Hypovolemic/hemorrhagic shock  - Secondary to above and fluid removal post dialysis. Resolved with IV fluids and resolution of GI bleed.   Acute blood loss anemia  - Transfused 2 units of PRBC on 1/12 during dialysis Hgb stable  Atrial fibrillation  - Had rapid ventricular response on admission  - Coreg  - Not anticoagulation candidate - ? To resume ASA/plavix after follow up with GI  End stage renal disease on dialysis  - Per nephrology   Diabetes mellitus, type II  -low blood sugars  Hypertension  - Continue Coreg   Hypothyroidism  - Continue levothyroxine   History of peripheral vascular disease- is post recent left AKA  - Staples intact, would clean.  - Given GI bleed with hemorrhagic shock-aspirin and Plavix on hold  -   History of Parkinson's disease  - Continue Sinemet   Right foot venous stasis ulcer  -1X2cm sacral ulcer w/ dressing in place.  - Per wound care   Dementia  - On Aricept   Procedures:    Consultations:  Cards  GI  nephro  Discharge Exam: Filed Vitals:   02/17/13 1143  BP: 84/63  Pulse: 97  Temp: 97.8 F (36.6 C)  Resp: 18    General: non verbal- at baseline Cardiovascular: irr Respiratory:  clear anterior  Discharge Instructions      Discharge Orders   Future Appointments Provider Department Dept Phone   03/02/2013 2:00 PM Lorretta Harp, MD [facility] [phone]   Future Orders Complete By Expires   Discharge instructions  As directed    Comments:     Home health- PT Renal diabetic diet Hold ASA, plavix until seen by GI and given ok to start bid PPI x 2 months then  q day   Increase activity slowly  As directed        Medication List    STOP taking these medications       ASPIRIN PO     insulin glargine 100 UNIT/ML injection  Commonly known as:  LANTUS     insulin NPH-regular Human (70-30) 100 UNIT/ML injection  Commonly known as:  NOVOLIN 70/30      TAKE these medications       atorvastatin 10 MG tablet  Commonly known as:  LIPITOR  Take 10 mg by mouth every morning.     calcium acetate (Phos Binder) 667 MG/5ML Soln  Commonly known as:  PHOSLYRA  Take 667-1,334 mg by mouth 3 (three) times daily with meals. Takes 27mls with snacks and 72mls with meals     carbidopa-levodopa 25-100 MG per tablet  Commonly known as:  SINEMET IR  Take 1.5 tablets by mouth 3 (three) times daily.     carvedilol 6.25 MG tablet  Commonly known as:  COREG  Take 3 tablets (18.75 mg total) by mouth 2 (two) times daily with a meal.     cinacalcet 30 MG tablet  Commonly known as:  SENSIPAR  Take 30 mg by mouth every evening.     CLARITIN-D 24 HOUR PO  Take 1 tablet by mouth at bedtime.     darbepoetin 40 MCG/0.4ML Soln injection  Commonly known as:  ARANESP  Inject 0.4 mLs (40 mcg total) into the vein every Monday with hemodialysis.     donepezil 10 MG tablet  Commonly known as:  ARICEPT  Take 10 mg by mouth at bedtime.     doxercalciferol 4 MCG/2ML injection  Commonly known as:  HECTOROL  Inject 1 mL (2 mcg total) into the vein every Monday, Wednesday, and Friday with hemodialysis.     feeding supplement (NEPRO CARB STEADY) Liqd  Take 237 mLs by mouth daily.     feeding supplement (RESOURCE BREEZE) Liqd  Take 1 Container by mouth daily.     FLUoxetine 40 MG capsule  Commonly known as:  PROZAC  Take 40 mg by mouth every morning.     levothyroxine 25 MCG tablet  Commonly known as:  SYNTHROID, LEVOTHROID  Take 25 mcg by mouth every morning.     multivitamin Tabs tablet  Take 1 tablet by mouth 2 (two) times daily.     pantoprazole 40 MG  tablet  Commonly known as:  PROTONIX  Take 1 tablet (40 mg total) by mouth 2 (two) times daily before a meal.     sodium chloride 0.9 % SOLN 100 mL with ferric gluconate 12.5 MG/ML SOLN 62.5 mg  Inject 62.5 mg into the vein every Monday, Wednesday, and Friday with hemodialysis.       Allergies  Allergen Reactions  . Hydralazine Rash   Follow-up Information   Follow up with Lorretta Harp, MD On 03/02/2013. (at 2 pm.)    Specialty:  Cardiology   Contact information:   [address] [place]  [place] [address] [phone]       Follow up with VYAS,DHRUV B., MD In 1 week.   Specialty:  Internal Medicine   Contact information:   [place] [address] [phone]       Follow up with Milus Banister, MD In 1 month.   Specialty:  Gastroenterology   Contact information:   [address] [address] [phone]        The results of significant diagnostics from this hospitalization (including imaging, microbiology, ancillary and laboratory) are listed below for reference.    Significant Diagnostic Studies: Ct Head Wo Contrast  02/15/2013   CLINICAL DATA:  Acute mental status change  EXAM: CT HEAD WITHOUT CONTRAST  TECHNIQUE: Contiguous axial images were obtained from the base of the skull through the vertex without intravenous contrast.  COMPARISON:  MR 06/04/2012 and earlier studies  FINDINGS: Atherosclerotic and physiologic intracranial calcifications. Diffuse parenchymal atrophy. Patchy areas of hypoattenuation in deep and periventricular white matter bilaterally. Stable lacunar infarcts in the left internal capsule and caudate nucleus. Negative for acute intracranial hemorrhage, mass lesion, acute infarction, midline shift, or mass-effect. Acute infarct may be inapparent on noncontrast CT. Ventricles and sulci symmetric. Bone windows demonstrate no focal lesion.  IMPRESSION: 1. Negative for bleed or other acute intracranial process. 2.  Stable atrophy, left lacunar infarcts, and nonspecific white matter changes.   Electronically Signed   By: Arne Cleveland M.D.   On: 02/15/2013 10:00   Dg Chest Port 1 View  02/14/2013   CLINICAL DATA:  Edema, renal failure  EXAM: PORTABLE CHEST - 1 VIEW  COMPARISON:  02/13/2013  FINDINGS: Right IJ central line tip at the SVC RA junction level. Stable cardiomegaly with vascular congestion. No current edema. No new collapse or consolidation. Negative for effusion or pneumothorax. Atherosclerosis of the thoracic aorta. Degenerative changes of the spine.  IMPRESSION: Cardiomegaly with vascular congestion.  Stable exam.   Electronically Signed   By: Daryll Brod M.D.   On: 02/14/2013 07:21   Dg Chest Port 1 View  02/13/2013   CLINICAL DATA:  Central line placement.  EXAM: PORTABLE CHEST - 1 VIEW  COMPARISON:  Single view of the chest 06/03/2012.  FINDINGS: Right IJ catheter is in place with the tip projecting over the lower superior vena cava. No pneumothorax. Lungs are clear. Heart size is enlarged. No pleural effusion is identified.  IMPRESSION: Tip of right IJ catheter projects over the lower superior vena cava. No pneumothorax.  Cardiomegaly without edema.   Electronically Signed   By: Inge Rise M.D.   On: 02/13/2013 02:25    Microbiology: Recent Results (from the past 240 hour(s))  MRSA PCR SCREENING     Status: Abnormal   Collection Time    02/13/13  5:37 AM      Result Value Range Status   MRSA by PCR POSITIVE (*) NEGATIVE Final   Comment:            The GeneXpert MRSA Assay (FDA     approved for NASAL specimens     only), is one component of a     comprehensive MRSA colonization     surveillance program. It is not     intended to diagnose MRSA     infection nor to guide or     monitor treatment for     MRSA infections.     RESULT CALLED TO, READ BACK BY AND VERIFIED WITH:     CALLED  TO RN DON HOVANDER [id number] @[phone]  THANEY     Labs: Basic Metabolic Panel:  Recent Labs Lab  02/13/13 1012 02/14/13 0309 02/15/13 0240 02/15/13 1245 02/16/13 0535 02/17/13 0500  NA 143 141 140  --  134* 135*  K 3.3* 3.6* 3.6*  --  4.2 4.2  CL 103 107 108  --  97 98  CO2 25 21 22   --  26 24  GLUCOSE 104* 163* 97 73 76 88  BUN 16 18 21   --  10 17  CREATININE 2.09* 2.51* 3.43*  --  2.21* 3.36*  CALCIUM 8.8 8.9 9.2  --  8.7 9.1  MG 1.8  --   --   --   --   --   PHOS 4.3  --  3.9  --   --   --    Liver Function Tests:  Recent Labs Lab 02/12/13 2224 02/13/13 1012 02/15/13 0240  AST 20 12  --   ALT <5 <5  --   ALKPHOS 120* 90  --   BILITOT 0.5 0.2*  --   PROT 7.1 5.3*  --   ALBUMIN 2.2* 1.7* 1.6*    Recent Labs Lab 02/13/13 1012  LIPASE 15   No results found for this basename: AMMONIA,  in the last 168 hours CBC:  Recent Labs Lab 02/12/13 2224  02/15/13 1245 02/15/13 1812 02/16/13 0200 02/16/13 1250 02/17/13 0600  WBC 19.7*  < > 9.3 8.4 9.0 9.4 8.1  NEUTROABS 17.1*  --   --   --   --   --   --   HGB 11.2*  < > 11.3* 10.6* 10.9* 10.8* 10.9*  HCT 36.3  < > 34.4* 32.9* 33.3* 33.1* 33.3*  MCV 100.0  < > 95.8 97.9 95.7 96.8 95.4  PLT 414*  < > 204 213 212 208 205  < > = values in this interval not displayed. Cardiac Enzymes: No results found for this basename: CKTOTAL, CKMB, CKMBINDEX, TROPONINI,  in the last 168 hours BNP: BNP (last 3 results) No results found for this basename: PROBNP,  in the last 8760 hours CBG:  Recent Labs Lab 02/16/13 1632 02/16/13 2053 02/16/13 2356 02/17/13 0415 02/17/13 1143  GLUCAP 112* 130* 104* 87 85       Signed:  VANN, JESSICA  [facility] 02/17/2013, 3:38 PM

## 2013-02-17 NOTE — Evaluation (Signed)
Physical Therapy Evaluation Patient Details Name: Katrina Meyer MRN: [id number] DOB: 23-Sep-1932 Today's Date: 02/17/2013 Time: 1350-1430 PT Time Calculation (min): 40 min  PT Assessment / Plan / Recommendation History of Present Illness  Patient admitted with GI bleed.  Has history of  ESRD (MWF -received last HD 1/9), DM, severe PAD s/p recent left AKA, dementia, admitted to [facility] on 1/9 w/ poor po intake, decreased LOC and melena.  Clinical Impression  Patient presents with decreased independence with mobility due to decreased arousal and other deficits listed below.  She will benefit from skilled PT in the acute setting to allow return home with 24 hour caregivers and HHPT.  Will benefit from transfer board for car transfers.    PT Assessment  Patient needs continued PT services    Follow Up Recommendations  Home health PT;Supervision/Assistance - 24 hour    Does the patient have the potential to tolerate intense rehabilitation    No  Barriers to Discharge  None      Equipment Recommendations  Other (comment) (long transfer board for car transfers)    Recommendations for Other Services   None  Frequency Min 3X/week    Precautions / Restrictions Precautions Precautions: Fall Precaution Comments: left AKA Restrictions Weight Bearing Restrictions: No   Pertinent Vitals/Pain No pain complaints      Mobility  Bed Mobility Overal bed mobility: +2 for physical assistance;Needs Assistance Bed Mobility: Supine to Sit;Sit to Supine Supine to sit: Max assist;HOB elevated Sit to supine: Max assist;+2 for physical assistance General bed mobility comments: pt lethargic and initially leaning to left; daughter assisted with pad under pt to come to sitting with HOB elevated; to supine with assist for trunk and legs Transfers Overall transfer level: Needs assistance Transfers: Lateral/Scoot Transfers  Lateral/Scoot Transfers: +2 physical assistance;Total assist General transfer  comment: assist to unweight hips with forward leaning and daughter helped with blanket under pt to scoot to left toward head of bed prior to returning to supine    Exercises Other Exercises Other Exercises: performed right heel cord stretch and educated daughter needs to perform gently to allow foot to be flat to help with sitting balance and transfers; issued handout per daughter request to use with aides in the home.   PT Diagnosis: Generalized weakness  PT Problem List: Decreased strength;Decreased range of motion;Decreased knowledge of use of DME;Decreased activity tolerance;Decreased safety awareness;Decreased knowledge of precautions;Decreased mobility;Pain;Decreased skin integrity PT Treatment Interventions: DME instruction;Balance training;Gait training;Functional mobility training;Patient/family education;Therapeutic activities;Therapeutic exercise;Wheelchair mobility training     PT Goals(Current goals can be found in the care plan section) Acute Rehab PT Goals Patient Stated Goal: To go home today PT Goal Formulation: With family Time For Goal Achievement: 02/24/13 Potential to Achieve Goals: Fair  Visit Information  Last PT Received On: 02/17/13 Assistance Needed: +2 History of Present Illness: Patient admitted with GI bleed.  Has history of  ESRD (MWF -received last HD 1/9), DM, severe PAD s/p recent left AKA, dementia, admitted to [facility] on 1/9 w/ poor po intake, decreased LOC and melena.       Prior [place] expects to be discharged to:: Private residence Living Arrangements: Alone Available Help at Discharge: Available 24 hours/day;Personal care attendant Type of Home: House Home Access: Level entry Home Layout: One level Home Equipment: Other (comment);Wheelchair - manual;Toilet riser;Grab bars - toilet;Shower seat;Walker - 4 wheels (hoyer lift and lift chair) Prior Function Level of Independence: Needs assistance Gait / Transfers  Assistance Needed:  was having help to stand pivot to chair with walker ADL's / Homemaking Assistance Needed: aides help with shower and with dressing, tolileting etc.    Cognition  Cognition Arousal/Alertness: Lethargic Behavior During Therapy: Flat affect Overall Cognitive Status: History of cognitive impairments - at baseline (daughter reports waxing/waning and interacts more when she wants)    Extremity/Trunk Assessment Upper Extremity Assessment Upper Extremity Assessment: RUE deficits/detail;LUE deficits/detail RUE Deficits / Details: AAROM shoulder flexion 110 degrees, right middle finger limited AROM and pain due to recent revascularization per daughter RUE: Unable to fully assess due to pain LUE Deficits / Details: AAROM shoulder flexion 115 degrees weakness limits active lifting strength 3-/5 Lower Extremity Assessment Lower Extremity Assessment: RLE deficits/detail;LLE deficits/detail RLE Deficits / Details: AAROM WFL except ankle DF limited to about -50 degrees from neutral tight hamstrings, limited strength testing due to pt not fully aroused LLE Deficits / Details: AAROM hip flexion WFL, leg in soft conforming mold son o visualized Cervical / Trunk Assessment Cervical / Trunk Assessment: Kyphotic   Balance Balance Overall balance assessment: Needs assistance Sitting-balance support: Bilateral upper extremity supported Sitting balance-Leahy Scale: Poor Sitting balance - Comments: initially falling right, assisted to left lateral lean to improve independence and sitting balance, sat edge of bed about 5-7 minutes, then assisted to scoot and pt initially fell back needing assist to recover  End of Session PT - End of Session Activity Tolerance: Patient limited by fatigue Patient left: in bed;with call bell/phone within reach;with family/visitor present Nurse Communication: Mobility status  GP     [facility] 02/17/2013, 2:50 PM  [place], [place] 02/17/2013

## 2013-02-17 NOTE — Progress Notes (Signed)
   CARE MANAGEMENT NOTE 02/17/2013  Patient:  Katrina Meyer, Katrina Meyer   Account Number:  [id number]  Date Initiated:  02/17/2013  Documentation initiated by:  Lizabeth Leyden  Subjective/Objective Assessment:   admitted with GI bleed, bloody stools  recent INPT at [facility], left AKA and dc'd 02/12/13  ESRD/HD on MWF     Action/Plan:   active with home health RN, PT, ST from [place] Date:  02/18/2013   Anticipated DC Plan:  [place]  CM consult      [facility] Choice  Resumption Of Svcs/PTA Provider   Choice offered to / List presented to:             Status of service:  Completed, signed off Medicare Important Message given?   (If response is "NO", the following Medicare IM given date fields will be blank) Date Medicare IM given:   Date Additional Medicare IM given:    Discharge Disposition:  [place]  Per UR Regulation:    If discussed at Long Length of Stay Meetings, dates discussed:    Comments:  02/17/2013  [place], [place]  [facility]Kristen called to update on discharge planning for resumption of home health services.

## 2013-02-17 NOTE — Progress Notes (Signed)
Patient discharged.  Patient caregivers educated on discharge instructions, follow-up appointments, and discharge medications.  Prescriptions given to caregiver.  Caregiver verbalized understanding.  AVS signed.  IV removed by IV team.  Patient belongings gathered.  Social worker arranged ambulance transport for patient.

## 2013-02-17 NOTE — Progress Notes (Signed)
[facility] Progress Note  Subjective:  C/o left leg pain  Objective Filed Vitals:   02/17/13 0416 02/17/13 0740 02/17/13 0745 02/17/13 0800  BP: 150/71 100/44 103/37 107/67  Pulse: 97 114 108 109  Temp: 98.3 F (36.8 C) 98.2 F (36.8 C)    TempSrc: Oral Oral    Resp: 15 14    Height:      Weight:  65.4 kg (144 lb 2.9 oz)    SpO2: 99% 100%     Physical Exam goal 2.5 -  General: eyes open - min soft speech difficult to hear Heart: tachy irreg  Lungs: clear anteriorally Abdomen: soft NT; mild dependent edema  Extremities: tr right LE edema with SCDs; heel wrapped in dressing; left AKA with high protective brace/shrinker  Dialysis Access: Right upper AVGG Qb 300 Dialysis Orders: Center: [facility] on MWF .   EDW was 65 before AKA .kg HD Bath 2.0 k, 2.25ca Time 3hrs 38min Heparin 2000. Access RUAAVGG BFR 300 DFR 800 Hectorol 2 mcg IV/HD Epogen 4800 Units IV/HD Venofer 100 mg Needs 9 doses  Assessment/Plan:  1. Hypotension/volume - BP better ? due toGi bleed/ InfecSp AKA / tx 2 units yesterday may have helped; Net UF 1 L yesterday with post weight of 65.2; EDW 65 BEFORE AKA; ?? Wt error/variance due to bed vs standing scale - has not been drinking that much and does not appear to have a large amount of dependent edema; 2. ESRD= HD MWF K4.2 - use 4 K bath continue no hep on hd for now - 3. GI Bleed= Endo showed mod gastritis and duodenitis bx pending - GI rec bid PPI x 2 months then q day; off plavix; primary to clarify if she should be on asa;  4. Anemia - Hgb 9.0 > 7.4 in 7s x 2 days - transfused 2 units 1/12 Up to 11.3 > 10.9 - aranesp 40 q Mond and continue IV Fe 5. Metabolic bone disease - Vit d on hd / binders when pos 6. PVD sp recent L AKA d/c from [facility] on 1/8 following a prolonged stay for critical LLE ischemia with subsequent left AKA 7. A. Fib - BP variable -HR 110 - 120 - on carvedilol 12.5 bid but need to keep BP up so she can get med - ? Increase med vs add  amio???. 8. Dementia/Parkinson's - often nonverbal at baseline 9. Nutrition - intake poor Alb 1.6 - change diet to regular and add nepro 10. Depression - on prozac 40; hard to assess -would expect more situation depression following AKA; I think d/c to home surroundings and support systems will be helpful; d/w daughter 60. Hypothyroidism - on synthroid 12. Contact precautions - MRSA 13. Disp - possible d/c post HD [place], PA-C [facility] Beeper [phone] 02/17/2013,8:24 AM  LOS: 5 days   I have seen and examined patient, discussed with PA and agree with assessment and plan as outlined above. Pt is chronically ill at baseline and has lots of support at home (full-time aids, sitters).  OK for discharge from renal standpoint.   Kelly Splinter MD pager [phone]    cell [phone] 02/17/2013, 12:47 PM    Additional Objective Labs: Basic Metabolic Panel:  Recent Labs Lab 02/13/13 1012  02/15/13 0240 02/15/13 1245 02/16/13 0535 02/17/13 0500  NA 143  < > 140  --  134* 135*  K 3.3*  < > 3.6*  --  4.2 4.2  CL 103  < >  108  --  97 98  CO2 25  < > 22  --  26 24  GLUCOSE 104*  < > 97 73 76 88  BUN 16  < > 21  --  10 17  CREATININE 2.09*  < > 3.43*  --  2.21* 3.36*  CALCIUM 8.8  < > 9.2  --  8.7 9.1  PHOS 4.3  --  3.9  --   --   --   < > = values in this interval not displayed. Liver Function Tests:  Recent Labs Lab 02/12/13 2224 02/13/13 1012 02/15/13 0240  AST 20 12  --   ALT <5 <5  --   ALKPHOS 120* 90  --   BILITOT 0.5 0.2*  --   PROT 7.1 5.3*  --   ALBUMIN 2.2* 1.7* 1.6*    Recent Labs Lab 02/13/13 1012  LIPASE 15   CBC:  Recent Labs Lab 02/12/13 2224  02/15/13 1245 02/15/13 1812 02/16/13 0200 02/16/13 1250 02/17/13 0600  WBC 19.7*  < > 9.3 8.4 9.0 9.4 8.1  NEUTROABS 17.1*  --   --   --   --   --   --   HGB 11.2*  < > 11.3* 10.6* 10.9* 10.8* 10.9*  HCT 36.3  < > 34.4* 32.9* 33.3* 33.1* 33.3*  MCV 100.0  < > 95.8 97.9 95.7  96.8 95.4  PLT 414*  < > 204 213 212 208 205  < > = values in this interval not displayed. Blood Culture No results found for this basename: sdes, specrequest, cult, reptstatus  CBG:  Recent Labs Lab 02/16/13 1159 02/16/13 1632 02/16/13 2053 02/16/13 2356 02/17/13 0415  GLUCAP 120* 112* 130* 104* 87  Medications: . sodium chloride Stopped (02/14/13 1008)   . antiseptic oral rinse  15 mL Mouth Rinse BID  . atorvastatin  10 mg Oral q morning - 10a  . calcium acetate  667 mg Oral TID WC  . carbidopa-levodopa  1.5 tablet Oral TID  . carvedilol  12.5 mg Oral BID WC  . Chlorhexidine Gluconate Cloth  6 each Topical Q0600  . cinacalcet  30 mg Oral Q supper  . darbepoetin (ARANESP) injection - DIALYSIS  40 mcg Intravenous Q Mon-HD  . donepezil  10 mg Oral QHS  . doxercalciferol  2 mcg Intravenous Q M,W,F-HD  . feeding supplement (NEPRO CARB STEADY)  237 mL Oral Q24H  . feeding supplement (RESOURCE BREEZE)  1 Container Oral Q24H  . ferric gluconate (FERRLECIT/NULECIT) IV  62.5 mg Intravenous Q M,W,F-HD  . FLUoxetine  40 mg Oral q morning - 10a  . insulin aspart  0-9 Units Subcutaneous TID WC  . levothyroxine  25 mcg Oral q morning - 10a  . multivitamin  1 tablet Oral BID  . mupirocin ointment  1 application Nasal BID  . pantoprazole  40 mg Oral BID AC

## 2013-02-17 NOTE — Progress Notes (Signed)
PATIENT DETAILS Name: Katrina Meyer Age: 80 y.o. Sex: female Date of Birth: [birth year] Admit Date: 02/12/2013 Admitting Physician Chesley Mires, MD [id number],ALPFX B., MD  Subjective: Does not speak-but follows commands.  HR has been uncontrolled fast   Assessment/Plan:   GI bleed - Secondary to gastritis/duodenitis - EGD on 02/14/13, biopsies pending - Recommendations from gastroenterology forPPI twice a day for next 2 months, and then daily.  - No further melanotic stool observed.   Hypovolemic/hemorrhagic shock  - Secondary to above and fluid removal post dialysis. Resolved with IV fluids and resolution of GI bleed.  Acute blood loss anemia  - Transfused 2 units of PRBC on 1/12 during dialysis Trend CBC   Atrial fibrillation - Had rapid ventricular response on admission - still fast- ask cards to see: ? amiodarone - Not anticoagulation candidate     End stage renal disease on dialysis - Per nephrology     Diabetes mellitus, type II - CBGs stable  - c/w SSI  Hypertension - Continue Coreg  Hypothyroidism - Continue levothyroxine   History of peripheral vascular disease- is post recent left AKA - Staples intact, would clean. - Given GI bleed with hemorrhagic shock-aspirin and Plavix on hold  History of Parkinson's disease - Continue Sinemet  Right foot venous stasis ulcer -1X2cm sacral ulcer w/ dressing in place.  - Per wound care    Dementia - On Aricept  Deconditioning -Needs PT eval-prior to discharge-have ordered  Disposition: Remain inpatient-  DVT Prophylaxis: SCD's  Code Status: Full code  Family Communication No family at bedside  Procedures:   EGD 1/11  CONSULTS:  GI    MEDICATIONS: Scheduled Meds: . antiseptic oral rinse  15 mL Mouth Rinse BID  . atorvastatin  10 mg Oral q morning - 10a  . calcium acetate  667 mg Oral TID WC  . carbidopa-levodopa  1.5 tablet Oral TID  . carvedilol  12.5 mg Oral BID WC  .  Chlorhexidine Gluconate Cloth  6 each Topical Q0600  . cinacalcet  30 mg Oral Q supper  . darbepoetin (ARANESP) injection - DIALYSIS  40 mcg Intravenous Q Mon-HD  . donepezil  10 mg Oral QHS  . doxercalciferol      . doxercalciferol  2 mcg Intravenous Q M,W,F-HD  . feeding supplement (NEPRO CARB STEADY)  237 mL Oral Q24H  . feeding supplement (RESOURCE BREEZE)  1 Container Oral Q24H  . ferric gluconate (FERRLECIT/NULECIT) IV  62.5 mg Intravenous Q M,W,F-HD  . FLUoxetine  40 mg Oral q morning - 10a  . insulin aspart  0-9 Units Subcutaneous TID WC  . levothyroxine  25 mcg Oral q morning - 10a  . multivitamin  1 tablet Oral BID  . mupirocin ointment  1 application Nasal BID  . pantoprazole  40 mg Oral BID AC   Continuous Infusions: . sodium chloride Stopped (02/14/13 1008)   PRN Meds:.sodium chloride, sodium chloride, feeding supplement (NEPRO CARB STEADY), HYDROcodone-acetaminophen, lidocaine (PF), lidocaine-prilocaine, pentafluoroprop-tetrafluoroeth, sodium chloride  Antibiotics: Anti-infectives   None       PHYSICAL EXAM: Vital signs in last 24 hours: Filed Vitals:   02/17/13 0859 02/17/13 0917 02/17/13 0930 02/17/13 1000  BP: 113/66 84/55 101/60 100/59  Pulse: 106 119 108 119  Temp:      TempSrc:      Resp:      Height:      Weight:      SpO2:        Weight change: -0.891  kg (-1 lb 15.4 oz) Filed Weights   02/15/13 2209 02/16/13 2030 02/17/13 0740  Weight: 66 kg (145 lb 8.1 oz) 65.409 kg (144 lb 3.2 oz) 65.4 kg (144 lb 2.9 oz)   Body mass index is 21.28 kg/(m^2).   Gen Exam: Awake, will not speak but follows commands- in dialysis Neck: Supple, No JVD.   Chest: B/L Clear.   CVS: irregular and fast.  Abdomen: soft, BS +, non tender, non distended.  Extremities: Status post left AKA Neurologic: Non Focal.   Skin: No Rash.   Wounds: N/A.   Intake/Output from previous day:  Intake/Output Summary (Last 24 hours) at 02/17/13 1008 Last data filed at 02/16/13  1700  Gross per 24 hour  Intake    360 ml  Output      0 ml  Net    360 ml     LAB RESULTS: CBC  Recent Labs Lab 02/12/13 2224  02/15/13 1245 02/15/13 1812 02/16/13 0200 02/16/13 1250 02/17/13 0600  WBC 19.7*  < > 9.3 8.4 9.0 9.4 8.1  HGB 11.2*  < > 11.3* 10.6* 10.9* 10.8* 10.9*  HCT 36.3  < > 34.4* 32.9* 33.3* 33.1* 33.3*  PLT 414*  < > 204 213 212 208 205  MCV 100.0  < > 95.8 97.9 95.7 96.8 95.4  MCH 30.9  < > 31.5 31.5 31.3 31.6 31.2  MCHC 30.9  < > 32.8 32.2 32.7 32.6 32.7  RDW 17.1*  < > 16.0* 16.5* 16.6* 16.6* 16.5*  LYMPHSABS 1.1  --   --   --   --   --   --   MONOABS 1.3*  --   --   --   --   --   --   EOSABS 0.1  --   --   --   --   --   --   BASOSABS 0.1  --   --   --   --   --   --   < > = values in this interval not displayed.  Chemistries   Recent Labs Lab 02/13/13 1012 02/14/13 0309 02/15/13 0240 02/15/13 1245 02/16/13 0535 02/17/13 0500  NA 143 141 140  --  134* 135*  K 3.3* 3.6* 3.6*  --  4.2 4.2  CL 103 107 108  --  97 98  CO2 25 21 22   --  26 24  GLUCOSE 104* 163* 97 73 76 88  BUN 16 18 21   --  10 17  CREATININE 2.09* 2.51* 3.43*  --  2.21* 3.36*  CALCIUM 8.8 8.9 9.2  --  8.7 9.1  MG 1.8  --   --   --   --   --     CBG:  Recent Labs Lab 02/16/13 1159 02/16/13 1632 02/16/13 2053 02/16/13 2356 02/17/13 0415  GLUCAP 120* 112* 130* 104* 87    GFR Estimated Creatinine Clearance: 14.2 ml/min (by C-G formula based on Cr of 3.36).  Coagulation profile  Recent Labs Lab 02/12/13 2224 02/13/13 1012  INR 1.41 1.53*    Cardiac Enzymes No results found for this basename: CK, CKMB, TROPONINI, MYOGLOBIN,  in the last 168 hours  No components found with this basename: POCBNP,  No results found for this basename: DDIMER,  in the last 72 hours No results found for this basename: HGBA1C,  in the last 72 hours No results found for this basename: CHOL, HDL, LDLCALC, TRIG, CHOLHDL, LDLDIRECT,  in the last 72 hours  No results found for  this basename: TSH, T4TOTAL, FREET3, T3FREE, THYROIDAB,  in the last 72 hours No results found for this basename: VITAMINB12, FOLATE, FERRITIN, TIBC, IRON, RETICCTPCT,  in the last 72 hours No results found for this basename: LIPASE, AMYLASE,  in the last 72 hours  Urine Studies No results found for this basename: UACOL, UAPR, USPG, UPH, UTP, UGL, UKET, UBIL, UHGB, UNIT, UROB, ULEU, UEPI, UWBC, URBC, UBAC, CAST, CRYS, UCOM, BILUA,  in the last 72 hours  MICROBIOLOGY: Recent Results (from the past 240 hour(s))  MRSA PCR SCREENING     Status: Abnormal   Collection Time    02/13/13  5:37 AM      Result Value Range Status   MRSA by PCR POSITIVE (*) NEGATIVE Final   Comment:            The GeneXpert MRSA Assay (FDA     approved for NASAL specimens     only), is one component of a     comprehensive MRSA colonization     surveillance program. It is not     intended to diagnose MRSA     infection nor to guide or     monitor treatment for     MRSA infections.     RESULT CALLED TO, READ BACK BY AND VERIFIED WITH:     CALLED TO RN DON HOVANDER [phone] @[phone]  THANEY    RADIOLOGY STUDIES/RESULTS: Ct Head Wo Contrast  02/15/2013   CLINICAL DATA:  Acute mental status change  EXAM: CT HEAD WITHOUT CONTRAST  TECHNIQUE: Contiguous axial images were obtained from the base of the skull through the vertex without intravenous contrast.  COMPARISON:  MR 06/04/2012 and earlier studies  FINDINGS: Atherosclerotic and physiologic intracranial calcifications. Diffuse parenchymal atrophy. Patchy areas of hypoattenuation in deep and periventricular white matter bilaterally. Stable lacunar infarcts in the left internal capsule and caudate nucleus. Negative for acute intracranial hemorrhage, mass lesion, acute infarction, midline shift, or mass-effect. Acute infarct may be inapparent on noncontrast CT. Ventricles and sulci symmetric. Bone windows demonstrate no focal lesion.  IMPRESSION: 1. Negative for bleed or other  acute intracranial process. 2. Stable atrophy, left lacunar infarcts, and nonspecific white matter changes.   Electronically Signed   By: Arne Cleveland M.D.   On: 02/15/2013 10:00   Dg Chest Port 1 View  02/14/2013   CLINICAL DATA:  Edema, renal failure  EXAM: PORTABLE CHEST - 1 VIEW  COMPARISON:  02/13/2013  FINDINGS: Right IJ central line tip at the SVC RA junction level. Stable cardiomegaly with vascular congestion. No current edema. No new collapse or consolidation. Negative for effusion or pneumothorax. Atherosclerosis of the thoracic aorta. Degenerative changes of the spine.  IMPRESSION: Cardiomegaly with vascular congestion.  Stable exam.   Electronically Signed   By: Daryll Brod M.D.   On: 02/14/2013 07:21   Dg Chest Port 1 View  02/13/2013   CLINICAL DATA:  Central line placement.  EXAM: PORTABLE CHEST - 1 VIEW  COMPARISON:  Single view of the chest 06/03/2012.  FINDINGS: Right IJ catheter is in place with the tip projecting over the lower superior vena cava. No pneumothorax. Lungs are clear. Heart size is enlarged. No pleural effusion is identified.  IMPRESSION: Tip of right IJ catheter projects over the lower superior vena cava. No pneumothorax.  Cardiomegaly without edema.   Electronically Signed   By: Inge Rise M.D.   On: 02/13/2013 02:25    Eulogio Bear, DO  [facility]  Pager:[phone]  If 7PM-7AM, please contact night-coverage [email or web address] Password [email or web address] 02/17/2013, 10:08 AM   LOS: 5 days

## 2013-02-17 NOTE — Consult Note (Signed)
CARDIOLOGY CONSULT NOTE   Patient ID: Katrina Meyer MRN: [id number] DOB/AGE: 1932-04-05 80 y.o.  Admit date: 02/12/2013  Primary Physician   Glenda Chroman., MD Primary Cardiologist  Was RR-> will be Gwenlyn Found, also sees Brunetta Jeans in [place] Reason for Consultation  Atrial fib, RVR  OEU:MPNTIR VELERA LANSDALE is a 80 y.o. female with a history of PAF, ESRD (MWF), DM, severe PAD s/p recent left AKA (last week in [place]), dementia. Not previously anticoagulated due to hx GIB and Parkinson's with increased fall risk. At baseline, she does not speak but follows commands.   She was admitted 01/09 with melena and hypotension. She was transfused 2 U PRBCs and her H&H improved from 7.4/23.2 ->10.9/33.3. Initially admitted by CCM due to hypotension, seen by GI and nephrololgy. She had an EGD with moderate nodular distal gastritis and duodenitis, now on PPI. She was taken off Plavix and ASA.  She was in SR/Atrial fibrillation on admission, has been in atrial fib since then, HR generally elevated. Cardiology was asked to evaluate her for HR control. She is being seen by PT, but is otherwise ready for D/C.   Ms Devonshire is able to indicate yes/no answers. She denies chest pain or SOB. She denies palpitations. She does not have syncope.     Past Medical History  Diagnosis Date  . Hyperlipidemia     takes Lipitor daily  . DVT (deep venous thrombosis)   . Stroke   . Parkinson's disease     takes Sinemet daily  . Tibia/fibula fracture 01/04/2012    Treated with long leg casting. Cast change in hospital 02/27/12   . Peripheral vascular disease 02/27/2012    Skin ulcer of right great toe  . Atrial fibrillation 03/09/2012    Seen on older EKG as well but family not aware.   . Macrocytic anemia 03/09/2012  . Lower GI bleed 03/10/2012    hematochezia secondary to ulcerated polyps; diverticulosis; chronic diarrhea  . End stage renal disease on dialysis     [facility]- MWF  . Hypertension     takes Coreg  daily  . Dementia     takes Aricept daily  . Diabetes mellitus, type II     takes Lantus and Novolog  . Seasonal allergies     takes Allegra daily  . Depression     takes Prozac daily  . Hypothyroidism     takes Synthroid daily     Past Surgical History  Procedure Laterality Date  . Dg av dialysis graft declot or    . Av fistula repair  Aug. 2013    Left arm  . Cholecystectomy    . Colon surgery      Aprrox 2006 at [facility] after she had a colonoscopy  . Colonoscopy  03/10/2012    Procedure: COLONOSCOPY;  Surgeon: Rogene Houston, MD;  [facility];  Service: Endoscopy;  Laterality: N/A;  . Esophagogastroduodenoscopy  03/10/2012    Procedure: ESOPHAGOGASTRODUODENOSCOPY (EGD);  Surgeon: Rogene Houston, MD;  Location[facility];  Service: Endoscopy;  Laterality: N/A;  EGD if TCS normal.  . Ligation of arteriovenous  fistula Left 03/24/2012    Procedure: LIGATION OF ARTERIOVENOUS  FISTULA;  Surgeon: Conrad Harriman, MD;  Location: [place];  Service: Vascular;  Laterality: Left;  . Av fistula placement Right 04/28/2012    Procedure: INSERTION OF ARTERIOVENOUS (AV) GORE-TEX GRAFT ARM;  Surgeon: Elam Dutch, MD;  Location: [place];  Service: Vascular;  Laterality: Right;  . Leg amputation above knee Left   . Angioplast of lle  12/2012-01/2013    Several Procedures  . Esophagogastroduodenoscopy N/A 02/14/2013    Procedure: ESOPHAGOGASTRODUODENOSCOPY (EGD);  Surgeon: Milus Banister, MD;  Location: [place];  Service: Endoscopy;  Laterality: N/A;    Allergies  Allergen Reactions  . Hydralazine Rash    I have reviewed the patient's current medications . antiseptic oral rinse  15 mL Mouth Rinse BID  . atorvastatin  10 mg Oral q morning - 10a  . calcium acetate  667 mg Oral TID WC  . carbidopa-levodopa  1.5 tablet Oral TID  . carvedilol  12.5 mg Oral BID WC  . Chlorhexidine Gluconate Cloth  6 each Topical Q0600  . cinacalcet  30 mg Oral Q supper  . darbepoetin  (ARANESP) injection - DIALYSIS  40 mcg Intravenous Q Mon-HD  . donepezil  10 mg Oral QHS  . doxercalciferol  2 mcg Intravenous Q M,W,F-HD  . feeding supplement (NEPRO CARB STEADY)  237 mL Oral Q24H  . feeding supplement (RESOURCE BREEZE)  1 Container Oral Q24H  . ferric gluconate (FERRLECIT/NULECIT) IV  62.5 mg Intravenous Q M,W,F-HD  . FLUoxetine  40 mg Oral q morning - 10a  . insulin aspart  0-9 Units Subcutaneous TID WC  . levothyroxine  25 mcg Oral q morning - 10a  . multivitamin  1 tablet Oral BID  . mupirocin ointment  1 application Nasal BID  . pantoprazole  40 mg Oral BID AC   . sodium chloride Stopped (02/14/13 1008)   HYDROcodone-acetaminophen, sodium chloride  Prior to Admission medications   Medication Sig Start Date End Date Taking? Authorizing Provider  ASPIRIN PO Take 1 tablet by mouth every morning.   Yes Historical Provider, MD  atorvastatin (LIPITOR) 10 MG tablet Take 10 mg by mouth every morning.    Yes Historical Provider, MD  calcium acetate, Phos Binder, (PHOSLYRA) 667 MG/5ML SOLN Take 667-1,334 mg by mouth 3 (three) times daily with meals. Takes 75mls with snacks and 69mls with meals   Yes Historical Provider, MD  carbidopa-levodopa (SINEMET IR) 25-100 MG per tablet Take 1.5 tablets by mouth 3 (three) times daily.   Yes Historical Provider, MD  carvedilol (COREG) 12.5 MG tablet Take 12.5 mg by mouth 2 (two) times daily with a meal.   Yes Historical Provider, MD  cinacalcet (SENSIPAR) 30 MG tablet Take 30 mg by mouth every evening.    Yes Historical Provider, MD  donepezil (ARICEPT) 10 MG tablet Take 10 mg by mouth at bedtime.    Yes Historical Provider, MD  FLUoxetine (PROZAC) 40 MG capsule Take 40 mg by mouth every morning.    Yes Historical Provider, MD  insulin glargine (LANTUS) 100 UNIT/ML injection Inject 6 Units into the skin at bedtime.    Yes Historical Provider, MD  insulin NPH-regular (NOVOLIN 70/30) (70-30) 100 UNIT/ML injection Inject 1-5 Units into the  skin 3 (three) times daily. Per sliding scale   Yes Historical Provider, MD  levothyroxine (SYNTHROID, LEVOTHROID) 25 MCG tablet Take 25 mcg by mouth every morning.   Yes Historical Provider, MD  Loratadine-Pseudoephedrine (CLARITIN-D 24 HOUR PO) Take 1 tablet by mouth at bedtime.    Yes Historical Provider, MD  multivitamin (RENA-VIT) TABS tablet Take 1 tablet by mouth 2 (two) times daily.    Yes Historical Provider, MD     History   Social History  . Marital Status: Married    Spouse  Name: N/A    Number of Children: N/A  . Years of Education: N/A   Occupational History  . Retired   Social History Main Topics  . Smoking status: Passive Smoke Exposure - Never Smoker    Types: Cigarettes  . Smokeless tobacco: Never Used  . Alcohol Use: No  . Drug Use: No  . Sexual Activity: No   Other Topics Concern  . Not on file   Social History Narrative  . Pt has good family support and 24-hr caregivers.    Family Status  Relation Status Death Age  . Mother Deceased   . Father Deceased    Family History  Problem Relation Age of Onset  . Stroke Mother   . Diabetes Mother   . Cancer Mother   . Hypertension Mother   . Coronary artery disease Father   . Diabetes Sister      ROS:  Full 14 point review of systems complete and found to be negative unless listed above.  Physical Exam: Blood pressure 84/63, pulse 97, temperature 97.8 F (36.6 C), temperature source Oral, resp. rate 18, height 5\' 9"  (1.753 m), weight 142 lb 3.2 oz (64.5 kg), SpO2 99.00%.  General: Well developed, well nourished, female in no acute distress Head: Eyes PERRLA, No xanthomas.   Normocephalic and atraumatic, oropharynx without edema or exudate. Dentition:  Lungs: CTA Heart: Heart irregular rate and rhythm with S1, S2, no murmur. pulses are decreased in extremities   Neck: No carotid bruits. No lymphadenopathy.  JVD not elevated. Abdomen: Bowel sounds decreased but present, abdomen soft and non-tender  without masses or hernias noted. Msk:  No spine or cva tenderness. No weakness, no joint deformities or effusions. Extremities: No clubbing or cyanosis. No edema.  Neuro: Alert and oriented. No focal deficits noted. Possible minimal right facial droop, daughter aware. Psych:  responds appropriately Skin: No rashes noted. Previous lesions are healing well.   Labs:   Lab Results  Component Value Date   WBC 8.1 02/17/2013   HGB 10.9* 02/17/2013   HCT 33.3* 02/17/2013   MCV 95.4 02/17/2013   PLT 205 02/17/2013    Recent Labs Lab 02/13/13 1012  02/15/13 0240  02/17/13 0500  NA 143  < > 140  < > 135*  K 3.3*  < > 3.6*  < > 4.2  CL 103  < > 108  < > 98  CO2 25  < > 22  < > 24  BUN 16  < > 21  < > 17  CREATININE 2.09*  < > 3.43*  < > 3.36*  CALCIUM 8.8  < > 9.2  < > 9.1  PROT 5.3*  --   --   --   --   BILITOT 0.2*  --   --   --   --   ALKPHOS 90  --   --   --   --   ALT <5  --   --   --   --   AST 12  --   --   --   --   GLUCOSE 104*  < > 97  < > 88  ALBUMIN 1.7*  --  1.6*  --   --   < > = values in this interval not displayed. Magnesium  Date Value Range Status  02/13/2013 1.8  1.5 - 2.5 mg/dL Final   Lipase  Date/Time Value Range Status  02/13/2013 10:12 AM 15  11 - 59 U/L Final  Echo: 03/10/2012 Conclusions - Left ventricle: The cavity size was normal. Wall thickness was increased in a pattern of severe LVH. Systolic function was normal. The estimated ejection fraction was in the range of 55% to 60%. Wall motion was normal; there were no regional wall motion abnormalities. - Aortic valve: Mild regurgitation. - Mitral valve: Calcified annulus. - Pulmonary arteries: Systolic pressure was mildly to moderately increased. PA peak pressure: 18mm Hg (S). - Pericardium, extracardiac: A trivial pericardial effusion was identified.  ECG:  02/13/2013 Atrial fib, RVR Rate 113 QT/QTc 380/521   ASSESSMENT AND PLAN:   The patient was seen today by Dr Gwenlyn Found, the patient evaluated  and the data reviewed.   Atrial fib, RVR: pt with history of atrial fib, rapid rate may be due to combination of recent surgery with ongoing recovery and acute GIB this admission. She is currently on Coreg 12.5 mg bid. Can increase Coreg and f/u in office. No extreme med changes due to pt needs to recover more from recent events. Medication changes at that time if HR control still poor.   Anticoagulation - remote hx GIB, current GIB is upper GIB, agree with no anticoagulation, f/u as OP.  Otherwise, per primary MD, Nephrology. OK to d/c today.  Principal Problem:   GI bleed Active Problems:   End stage renal disease on dialysis   Diabetes mellitus, type II   Sacral decubitus ulcer, stage II   Altered mental status   GIB (gastrointestinal bleeding)   Unspecified gastritis and gastroduodenitis without mention of hemorrhage   Hiatal hernia   Signed: Rosaria Ferries, PA-C 02/17/2013 1:17 PM Beeper [phone]  Co-Sign MD   Agree with A/P of Rhonda Barrett PA-C. Pt with H/O PAF and PAD s/p LBKA and RLE percutaneous revasc by Dr. Andree Elk in [place]. She has CRI on HD. She was admitted with GIB presumably upper with EGD which supported this. She was transfused. We are asked to see because of high HR. She is not on anticoagulation appropriately. Exam non contributory. Her HR is in th 100-110 range. She is on Coreg 12.5 mg PO BID with soft BP. We can increase to 18.75 mg BID. OK to D/C home. I will follow up as as OP. I have discussed this with Dr. Andree Elk.

## 2013-02-26 NOTE — Clinical Social Work Note (Signed)
LATE ENTRY NOTE FOR 1/9 - 02/17/13 ADMISSION: Ambulance transport requested and CSW facilitated transport  via ambulance.  Ian Cavey Givens, MSW, LCSW [phone]

## 2013-03-01 ENCOUNTER — Encounter: Payer: Self-pay | Admitting: Cardiovascular Disease

## 2013-03-02 ENCOUNTER — Inpatient Hospital Stay (HOSPITAL_COMMUNITY)
Admission: EM | Admit: 2013-03-02 | Discharge: 2013-03-09 | DRG: 871 | Disposition: A | Discharge status: Home Health | Payer: Medicare Other | Attending: Internal Medicine | Admitting: Internal Medicine

## 2013-03-02 ENCOUNTER — Ambulatory Visit: Payer: Medicare Other | Admitting: Cardiovascular Disease

## 2013-03-02 ENCOUNTER — Inpatient Hospital Stay (HOSPITAL_COMMUNITY): Payer: Medicare Other

## 2013-03-02 ENCOUNTER — Emergency Department (HOSPITAL_COMMUNITY): Payer: Medicare Other

## 2013-03-02 ENCOUNTER — Encounter (HOSPITAL_COMMUNITY): Payer: Self-pay | Admitting: Emergency Medicine

## 2013-03-02 DIAGNOSIS — N186 End stage renal disease: Secondary | ICD-10-CM | POA: Diagnosis present

## 2013-03-02 DIAGNOSIS — I739 Peripheral vascular disease, unspecified: Secondary | ICD-10-CM | POA: Diagnosis present

## 2013-03-02 DIAGNOSIS — R6520 Severe sepsis without septic shock: Secondary | ICD-10-CM

## 2013-03-02 DIAGNOSIS — K922 Gastrointestinal hemorrhage, unspecified: Secondary | ICD-10-CM

## 2013-03-02 DIAGNOSIS — F0280 Dementia in other diseases classified elsewhere without behavioral disturbance: Secondary | ICD-10-CM | POA: Diagnosis present

## 2013-03-02 DIAGNOSIS — K2970 Gastritis, unspecified, without bleeding: Secondary | ICD-10-CM

## 2013-03-02 DIAGNOSIS — R0681 Apnea, not elsewhere classified: Secondary | ICD-10-CM | POA: Diagnosis not present

## 2013-03-02 DIAGNOSIS — L89309 Pressure ulcer of unspecified buttock, unspecified stage: Secondary | ICD-10-CM | POA: Diagnosis present

## 2013-03-02 DIAGNOSIS — E871 Hypo-osmolality and hyponatremia: Secondary | ICD-10-CM | POA: Diagnosis present

## 2013-03-02 DIAGNOSIS — R579 Shock, unspecified: Secondary | ICD-10-CM

## 2013-03-02 DIAGNOSIS — E873 Alkalosis: Secondary | ICD-10-CM | POA: Diagnosis not present

## 2013-03-02 DIAGNOSIS — Z79899 Other long term (current) drug therapy: Secondary | ICD-10-CM

## 2013-03-02 DIAGNOSIS — G3183 Dementia with Lewy bodies: Secondary | ICD-10-CM

## 2013-03-02 DIAGNOSIS — G20 Parkinson's disease: Secondary | ICD-10-CM

## 2013-03-02 DIAGNOSIS — Z8673 Personal history of transient ischemic attack (TIA), and cerebral infarction without residual deficits: Secondary | ICD-10-CM

## 2013-03-02 DIAGNOSIS — R627 Adult failure to thrive: Secondary | ICD-10-CM | POA: Diagnosis present

## 2013-03-02 DIAGNOSIS — R5381 Other malaise: Secondary | ICD-10-CM | POA: Diagnosis present

## 2013-03-02 DIAGNOSIS — N2581 Secondary hyperparathyroidism of renal origin: Secondary | ICD-10-CM | POA: Diagnosis present

## 2013-03-02 DIAGNOSIS — E870 Hyperosmolality and hypernatremia: Secondary | ICD-10-CM | POA: Diagnosis present

## 2013-03-02 DIAGNOSIS — E039 Hypothyroidism, unspecified: Secondary | ICD-10-CM | POA: Diagnosis present

## 2013-03-02 DIAGNOSIS — Z833 Family history of diabetes mellitus: Secondary | ICD-10-CM

## 2013-03-02 DIAGNOSIS — S78119A Complete traumatic amputation at level between unspecified hip and knee, initial encounter: Secondary | ICD-10-CM

## 2013-03-02 DIAGNOSIS — K2990 Gastroduodenitis, unspecified, without bleeding: Secondary | ICD-10-CM

## 2013-03-02 DIAGNOSIS — R4182 Altered mental status, unspecified: Secondary | ICD-10-CM

## 2013-03-02 DIAGNOSIS — N039 Chronic nephritic syndrome with unspecified morphologic changes: Secondary | ICD-10-CM

## 2013-03-02 DIAGNOSIS — Z9089 Acquired absence of other organs: Secondary | ICD-10-CM

## 2013-03-02 DIAGNOSIS — L8992 Pressure ulcer of unspecified site, stage 2: Secondary | ICD-10-CM | POA: Diagnosis present

## 2013-03-02 DIAGNOSIS — F17200 Nicotine dependence, unspecified, uncomplicated: Secondary | ICD-10-CM | POA: Diagnosis present

## 2013-03-02 DIAGNOSIS — Z823 Family history of stroke: Secondary | ICD-10-CM

## 2013-03-02 DIAGNOSIS — F0390 Unspecified dementia without behavioral disturbance: Secondary | ICD-10-CM | POA: Diagnosis present

## 2013-03-02 DIAGNOSIS — I4891 Unspecified atrial fibrillation: Secondary | ICD-10-CM | POA: Diagnosis present

## 2013-03-02 DIAGNOSIS — R6521 Severe sepsis with septic shock: Secondary | ICD-10-CM

## 2013-03-02 DIAGNOSIS — Z8249 Family history of ischemic heart disease and other diseases of the circulatory system: Secondary | ICD-10-CM

## 2013-03-02 DIAGNOSIS — A419 Sepsis, unspecified organism: Principal | ICD-10-CM | POA: Diagnosis present

## 2013-03-02 DIAGNOSIS — I120 Hypertensive chronic kidney disease with stage 5 chronic kidney disease or end stage renal disease: Secondary | ICD-10-CM | POA: Diagnosis present

## 2013-03-02 DIAGNOSIS — N189 Chronic kidney disease, unspecified: Secondary | ICD-10-CM | POA: Diagnosis present

## 2013-03-02 DIAGNOSIS — Z888 Allergy status to other drugs, medicaments and biological substances status: Secondary | ICD-10-CM

## 2013-03-02 DIAGNOSIS — R6510 Systemic inflammatory response syndrome (SIRS) of non-infectious origin without acute organ dysfunction: Secondary | ICD-10-CM | POA: Diagnosis present

## 2013-03-02 DIAGNOSIS — E785 Hyperlipidemia, unspecified: Secondary | ICD-10-CM | POA: Diagnosis present

## 2013-03-02 DIAGNOSIS — G20A1 Parkinson's disease without dyskinesia, without mention of fluctuations: Secondary | ICD-10-CM | POA: Diagnosis present

## 2013-03-02 DIAGNOSIS — Z992 Dependence on renal dialysis: Secondary | ICD-10-CM

## 2013-03-02 DIAGNOSIS — I5022 Chronic systolic (congestive) heart failure: Secondary | ICD-10-CM

## 2013-03-02 DIAGNOSIS — L89152 Pressure ulcer of sacral region, stage 2: Secondary | ICD-10-CM

## 2013-03-02 DIAGNOSIS — K449 Diaphragmatic hernia without obstruction or gangrene: Secondary | ICD-10-CM

## 2013-03-02 DIAGNOSIS — F3289 Other specified depressive episodes: Secondary | ICD-10-CM | POA: Diagnosis present

## 2013-03-02 DIAGNOSIS — Z86718 Personal history of other venous thrombosis and embolism: Secondary | ICD-10-CM

## 2013-03-02 DIAGNOSIS — R197 Diarrhea, unspecified: Secondary | ICD-10-CM | POA: Diagnosis not present

## 2013-03-02 DIAGNOSIS — F329 Major depressive disorder, single episode, unspecified: Secondary | ICD-10-CM | POA: Diagnosis present

## 2013-03-02 DIAGNOSIS — I10 Essential (primary) hypertension: Secondary | ICD-10-CM | POA: Diagnosis present

## 2013-03-02 DIAGNOSIS — D631 Anemia in chronic kidney disease: Secondary | ICD-10-CM | POA: Diagnosis present

## 2013-03-02 DIAGNOSIS — E119 Type 2 diabetes mellitus without complications: Secondary | ICD-10-CM | POA: Diagnosis present

## 2013-03-02 DIAGNOSIS — J9600 Acute respiratory failure, unspecified whether with hypoxia or hypercapnia: Secondary | ICD-10-CM | POA: Diagnosis present

## 2013-03-02 LAB — COMPREHENSIVE METABOLIC PANEL
ALK PHOS: 74 U/L (ref 39–117)
ALT: <5 U/L (ref 0–35)
ALT: <5 U/L (ref 0–35)
AST: 30 U/L (ref 0–37)
AST: 21 U/L (ref 0–37)
Albumin: 1.7 g/dL — ABNORMAL LOW (ref 3.5–5.2)
Albumin: 1.2 g/dL — ABNORMAL LOW (ref 3.5–5.2)
Alkaline Phosphatase: 103 U/L (ref 39–117)
BILIRUBIN TOTAL: 0.2 mg/dL — AB (ref 0.3–1.2)
BUN: 24 mg/dL — ABNORMAL HIGH (ref 6–23)
BUN: 18 mg/dL (ref 6–23)
CALCIUM: 9.3 mg/dL (ref 8.4–10.5)
CO2: 24 meq/L (ref 19–32)
CO2: 12 mEq/L — ABNORMAL LOW (ref 19–32)
CREATININE: 3.45 mg/dL — AB (ref 0.50–1.10)
Calcium: 5.8 mg/dL — CL (ref 8.4–10.5)
Chloride: 106 mEq/L (ref 96–112)
Chloride: 119 mEq/L — ABNORMAL HIGH (ref 96–112)
Creatinine, Ser: 2.26 mg/dL — ABNORMAL HIGH (ref 0.50–1.10)
GFR calc Af Amer: 14 mL/min — ABNORMAL LOW (ref >90)
GFR calc Af Amer: 23 mL/min — ABNORMAL LOW (ref >90)
GFR calc non Af Amer: 12 mL/min — ABNORMAL LOW (ref >90)
GFR calc non Af Amer: 20 mL/min — ABNORMAL LOW (ref >90)
GLUCOSE: 175 mg/dL — AB (ref 70–99)
Glucose, Bld: 200 mg/dL — ABNORMAL HIGH (ref 70–99)
POTASSIUM: 2.2 meq/L — AB (ref 3.7–5.3)
Potassium: 3.4 mEq/L — ABNORMAL LOW (ref 3.7–5.3)
SODIUM: 151 meq/L — AB (ref 137–147)
Sodium: 150 mEq/L — ABNORMAL HIGH (ref 137–147)
TOTAL PROTEIN: 5 g/dL — AB (ref 6.0–8.3)
Total Bilirubin: 0.3 mg/dL (ref 0.3–1.2)
Total Protein: 3.5 g/dL — ABNORMAL LOW (ref 6.0–8.3)

## 2013-03-02 LAB — PHOSPHORUS: PHOSPHORUS: 1.6 mg/dL — AB (ref 2.3–4.6)

## 2013-03-02 LAB — CBC WITH DIFFERENTIAL/PLATELET
BASOS ABS: 0.1 10*3/uL (ref 0.0–0.1)
BASOS PCT: 1 % (ref 0–1)
Basophils Absolute: 0 10*3/uL (ref 0.0–0.1)
Basophils Relative: 0 % (ref 0–1)
EOS ABS: 0 10*3/uL (ref 0.0–0.7)
EOS ABS: 0 10*3/uL (ref 0.0–0.7)
EOS PCT: 0 % (ref 0–5)
Eosinophils Relative: 0 % (ref 0–5)
HEMATOCRIT: 36.4 % (ref 36.0–46.0)
HEMATOCRIT: 34.1 % — AB (ref 36.0–46.0)
Hemoglobin: 11.3 g/dL — ABNORMAL LOW (ref 12.0–15.0)
Hemoglobin: 10.7 g/dL — ABNORMAL LOW (ref 12.0–15.0)
Lymphocytes Relative: 11 % — ABNORMAL LOW (ref 12–46)
Lymphocytes Relative: 11 % — ABNORMAL LOW (ref 12–46)
Lymphs Abs: 1.2 10*3/uL (ref 0.7–4.0)
Lymphs Abs: 1.5 10*3/uL (ref 0.7–4.0)
MCH: 31.1 pg (ref 26.0–34.0)
MCH: 30.9 pg (ref 26.0–34.0)
MCHC: 31 g/dL (ref 30.0–36.0)
MCHC: 31.4 g/dL (ref 30.0–36.0)
MCV: 100.3 fL — AB (ref 78.0–100.0)
MCV: 98.6 fL (ref 78.0–100.0)
MONO ABS: 1.4 10*3/uL — AB (ref 0.1–1.0)
MONO ABS: 1.4 10*3/uL — AB (ref 0.1–1.0)
MONOS PCT: 10 % (ref 3–12)
Monocytes Relative: 12 % (ref 3–12)
NEUTROS ABS: 11.1 10*3/uL — AB (ref 1.7–7.7)
NEUTROS PCT: 79 % — AB (ref 43–77)
Neutro Abs: 8.4 10*3/uL — ABNORMAL HIGH (ref 1.7–7.7)
Neutrophils Relative %: 76 % (ref 43–77)
PLATELETS: 309 10*3/uL (ref 150–400)
Platelets: 326 10*3/uL (ref 150–400)
RBC: 3.63 MIL/uL — ABNORMAL LOW (ref 3.87–5.11)
RBC: 3.46 MIL/uL — AB (ref 3.87–5.11)
RDW: 17.6 % — AB (ref 11.5–15.5)
RDW: 17.5 % — ABNORMAL HIGH (ref 11.5–15.5)
WBC: 11.1 10*3/uL — ABNORMAL HIGH (ref 4.0–10.5)
WBC: 14 10*3/uL — ABNORMAL HIGH (ref 4.0–10.5)

## 2013-03-02 LAB — GLUCOSE, CAPILLARY
GLUCOSE-CAPILLARY: 137 mg/dL — AB (ref 70–99)
Glucose-Capillary: 228 mg/dL — ABNORMAL HIGH (ref 70–99)

## 2013-03-02 LAB — POCT I-STAT 3, ART BLOOD GAS (G3+)
Acid-Base Excess: 3 mmol/L — ABNORMAL HIGH (ref 0.0–2.0)
Bicarbonate: 28.2 mEq/L — ABNORMAL HIGH (ref 20.0–24.0)
O2 SAT: 99 %
Patient temperature: 97.99
TCO2: 30 mmol/L (ref 0–100)
pCO2 arterial: 42.5 mmHg (ref 35.0–45.0)
pH, Arterial: 7.429 (ref 7.350–7.450)
pO2, Arterial: 161 mmHg — ABNORMAL HIGH (ref 80.0–100.0)

## 2013-03-02 LAB — BLOOD GAS, ARTERIAL
ACID-BASE DEFICIT: 3.2 mmol/L — AB (ref 0.0–2.0)
Bicarbonate: 19 mEq/L — ABNORMAL LOW (ref 20.0–24.0)
DRAWN BY: 246101
FIO2: 0.6 %
LHR: 16 {breaths}/min
O2 Saturation: 99.7 %
PATIENT TEMPERATURE: 98.6
PCO2 ART: 21.9 mmHg — AB (ref 35.0–45.0)
PEEP: 5 cmH2O
TCO2: 19.6 mmol/L (ref 0–100)
VT: 530 mL
pH, Arterial: 7.547 — ABNORMAL HIGH (ref 7.350–7.450)
pO2, Arterial: 288 mmHg — ABNORMAL HIGH (ref 80.0–100.0)

## 2013-03-02 LAB — APTT: aPTT: 33 seconds (ref 24–37)

## 2013-03-02 LAB — LIPASE, BLOOD: Lipase: 20 U/L (ref 11–59)

## 2013-03-02 LAB — POCT I-STAT TROPONIN I: Troponin i, poc: 0.04 ng/mL (ref 0.00–0.08)

## 2013-03-02 LAB — POCT I-STAT, CHEM 8
BUN: 31 mg/dL — ABNORMAL HIGH (ref 6–23)
CALCIUM ION: 1.15 mmol/L (ref 1.13–1.30)
Chloride: 104 mEq/L (ref 96–112)
Creatinine, Ser: 3.9 mg/dL — ABNORMAL HIGH (ref 0.50–1.10)
GLUCOSE: 199 mg/dL — AB (ref 70–99)
HCT: 41 % (ref 36.0–46.0)
Hemoglobin: 13.9 g/dL (ref 12.0–15.0)
Potassium: 4.6 mEq/L (ref 3.7–5.3)
Sodium: 145 mEq/L (ref 137–147)
TCO2: 29 mmol/L (ref 0–100)

## 2013-03-02 LAB — PRO B NATRIURETIC PEPTIDE: Pro B Natriuretic peptide (BNP): 13840 pg/mL — ABNORMAL HIGH (ref 0–450)

## 2013-03-02 LAB — TYPE AND SCREEN
ABO/RH(D): O POS
Antibody Screen: NEGATIVE

## 2013-03-02 LAB — CG4 I-STAT (LACTIC ACID): LACTIC ACID, VENOUS: 7.93 mmol/L — AB (ref 0.5–2.2)

## 2013-03-02 LAB — MAGNESIUM: Magnesium: 1.1 mg/dL — ABNORMAL LOW (ref 1.5–2.5)

## 2013-03-02 LAB — PROTIME-INR
INR: 1.6 — AB (ref 0.00–1.49)
INR: 1.52 — ABNORMAL HIGH (ref 0.00–1.49)
Prothrombin Time: 18.6 seconds — ABNORMAL HIGH (ref 11.6–15.2)
Prothrombin Time: 17.9 seconds — ABNORMAL HIGH (ref 11.6–15.2)

## 2013-03-02 LAB — CORTISOL: Cortisol, Plasma: 17.4 ug/dL

## 2013-03-02 LAB — LACTIC ACID, PLASMA: Lactic Acid, Venous: 2 mmol/L (ref 0.5–2.2)

## 2013-03-02 MED ORDER — SODIUM CHLORIDE 0.9 % IV SOLN
1000.0000 mL | Freq: Once | INTRAVENOUS | Status: AC
  Administered 2013-03-02: 1000 mL via INTRAVENOUS

## 2013-03-02 MED ORDER — HYDROCORTISONE SOD SUCCINATE 100 MG IJ SOLR
50.0000 mg | Freq: Four times a day (QID) | INTRAMUSCULAR | Status: DC
  Administered 2013-03-02 – 2013-03-04 (×8): 50 mg via INTRAVENOUS
  Filled 2013-03-02 (×11): qty 1

## 2013-03-02 MED ORDER — POTASSIUM CHLORIDE 10 MEQ/100ML IV SOLN
10.0000 meq | INTRAVENOUS | Status: AC
  Administered 2013-03-02 (×4): 10 meq via INTRAVENOUS
  Filled 2013-03-02 (×4): qty 100

## 2013-03-02 MED ORDER — SUCCINYLCHOLINE CHLORIDE 20 MG/ML IJ SOLN
INTRAMUSCULAR | Status: AC
  Filled 2013-03-02: qty 1

## 2013-03-02 MED ORDER — NOREPINEPHRINE BITARTRATE 1 MG/ML IJ SOLN: 2.0000 ug/min | INTRAVENOUS | Status: DC

## 2013-03-02 MED ORDER — DEXMEDETOMIDINE HCL IN NACL 200 MCG/50ML IV SOLN
0.4000 ug/kg/h | INTRAVENOUS | Status: DC
  Filled 2013-03-02: qty 50

## 2013-03-02 MED ORDER — DEXTROSE 5 % IV SOLN
2.0000 ug/min | INTRAVENOUS | Status: DC
  Administered 2013-03-02: 10 ug/min via INTRAVENOUS
  Filled 2013-03-02 (×2): qty 4

## 2013-03-02 MED ORDER — HEPARIN SODIUM (PORCINE) 5000 UNIT/ML IJ SOLN
5000.0000 [IU] | Freq: Three times a day (TID) | INTRAMUSCULAR | Status: DC
  Administered 2013-03-02 – 2013-03-03 (×3): 5000 [IU] via SUBCUTANEOUS
  Filled 2013-03-02 (×5): qty 1

## 2013-03-02 MED ORDER — PIPERACILLIN-TAZOBACTAM 3.375 G IVPB 30 MIN
3.3750 g | Freq: Once | INTRAVENOUS | Status: AC
  Administered 2013-03-02: 3.375 g via INTRAVENOUS
  Filled 2013-03-02: qty 50

## 2013-03-02 MED ORDER — MIDAZOLAM HCL 2 MG/2ML IJ SOLN
INTRAMUSCULAR | Status: AC
  Administered 2013-03-02: 1 mg
  Filled 2013-03-02: qty 2

## 2013-03-02 MED ORDER — FENTANYL CITRATE 0.05 MG/ML IJ SOLN
100.0000 ug | INTRAMUSCULAR | Status: DC | PRN
Start: 2013-03-02 — End: 2013-03-03

## 2013-03-02 MED ORDER — VANCOMYCIN HCL IN DEXTROSE 750-5 MG/150ML-% IV SOLN
750.0000 mg | INTRAVENOUS | Status: DC
  Filled 2013-03-02: qty 150

## 2013-03-02 MED ORDER — VANCOMYCIN HCL IN DEXTROSE 1-5 GM/200ML-% IV SOLN: 1000.0000 mg | Freq: Once | INTRAVENOUS | Status: DC

## 2013-03-02 MED ORDER — CHLORHEXIDINE GLUCONATE 0.12 % MT SOLN
15.0000 mL | Freq: Two times a day (BID) | OROMUCOSAL | Status: DC
  Administered 2013-03-02 – 2013-03-09 (×14): 15 mL via OROMUCOSAL
  Filled 2013-03-02 (×13): qty 15

## 2013-03-02 MED ORDER — BIOTENE DRY MOUTH MT LIQD
15.0000 mL | Freq: Four times a day (QID) | OROMUCOSAL | Status: DC
  Administered 2013-03-02 – 2013-03-09 (×25): 15 mL via OROMUCOSAL

## 2013-03-02 MED ORDER — HYDROCORTISONE SOD SUCCINATE 100 MG IJ SOLR
50.0000 mg | Freq: Four times a day (QID) | INTRAMUSCULAR | Status: DC
  Filled 2013-03-02 (×3): qty 1

## 2013-03-02 MED ORDER — ACETAMINOPHEN 160 MG/5ML PO SOLN: 650.0000 mg | Freq: Four times a day (QID) | ORAL | Status: DC | PRN

## 2013-03-02 MED ORDER — PANTOPRAZOLE SODIUM 40 MG IV SOLR: 40.0000 mg | INTRAVENOUS | Status: DC

## 2013-03-02 MED ORDER — ETOMIDATE 2 MG/ML IV SOLN
INTRAVENOUS | Status: AC
  Administered 2013-03-02: 10 mg
  Filled 2013-03-02: qty 20

## 2013-03-02 MED ORDER — PIPERACILLIN-TAZOBACTAM IN DEX 2-0.25 GM/50ML IV SOLN
2.2500 g | Freq: Three times a day (TID) | INTRAVENOUS | Status: DC
  Administered 2013-03-02 – 2013-03-04 (×5): 2.25 g via INTRAVENOUS
  Filled 2013-03-02 (×8): qty 50

## 2013-03-02 MED ORDER — FENTANYL CITRATE 0.05 MG/ML IJ SOLN
INTRAMUSCULAR | Status: AC
  Administered 2013-03-02: 50 ug
  Filled 2013-03-02: qty 2

## 2013-03-02 MED ORDER — SODIUM CHLORIDE 0.9 % IV SOLN
INTRAVENOUS | Status: DC
  Administered 2013-03-02: 17:00:00 via INTRAVENOUS

## 2013-03-02 MED ORDER — PIPERACILLIN-TAZOBACTAM 3.375 G IVPB
3.3750 g | Freq: Four times a day (QID) | INTRAVENOUS | Status: DC
  Filled 2013-03-02 (×3): qty 50

## 2013-03-02 MED ORDER — VANCOMYCIN HCL 10 G IV SOLR
1500.0000 mg | INTRAVENOUS | Status: AC
  Administered 2013-03-02: 1500 mg via INTRAVENOUS
  Filled 2013-03-02: qty 1500

## 2013-03-02 MED ORDER — LIDOCAINE HCL (CARDIAC) 20 MG/ML IV SOLN
INTRAVENOUS | Status: AC
  Filled 2013-03-02: qty 5

## 2013-03-02 MED ORDER — INSULIN ASPART 100 UNIT/ML ~~LOC~~ SOLN
2.0000 [IU] | SUBCUTANEOUS | Status: DC
  Administered 2013-03-02: 6 [IU] via SUBCUTANEOUS
  Administered 2013-03-02: 2 [IU] via SUBCUTANEOUS
  Administered 2013-03-03 (×4): 4 [IU] via SUBCUTANEOUS
  Administered 2013-03-04: 2 [IU] via SUBCUTANEOUS
  Administered 2013-03-04: 4 [IU] via SUBCUTANEOUS
  Administered 2013-03-04 – 2013-03-05 (×4): 2 [IU] via SUBCUTANEOUS
  Administered 2013-03-05: 6 [IU] via SUBCUTANEOUS
  Administered 2013-03-05: 2 [IU] via SUBCUTANEOUS
  Administered 2013-03-05: 4 [IU] via SUBCUTANEOUS
  Administered 2013-03-06: 2 [IU] via SUBCUTANEOUS
  Administered 2013-03-07: 4 [IU] via SUBCUTANEOUS
  Administered 2013-03-08 (×2): 2 [IU] via SUBCUTANEOUS

## 2013-03-02 MED ORDER — VANCOMYCIN HCL IN DEXTROSE 1-5 GM/200ML-% IV SOLN
1000.0000 mg | Freq: Two times a day (BID) | INTRAVENOUS | Status: DC
  Filled 2013-03-02: qty 200

## 2013-03-02 MED ORDER — SODIUM CHLORIDE 0.9 % IV SOLN
1000.0000 mL | INTRAVENOUS | Status: DC
  Administered 2013-03-02: 1000 mL via INTRAVENOUS

## 2013-03-02 MED ORDER — SODIUM CHLORIDE 0.9 % IV BOLUS (SEPSIS): 1000.0000 mL | INTRAVENOUS | Status: DC | PRN

## 2013-03-02 MED ORDER — PANTOPRAZOLE SODIUM 40 MG IV SOLR
40.0000 mg | INTRAVENOUS | Status: DC
  Administered 2013-03-02: 40 mg via INTRAVENOUS
  Filled 2013-03-02 (×2): qty 40

## 2013-03-02 MED ORDER — ROCURONIUM BROMIDE 50 MG/5ML IV SOLN
INTRAVENOUS | Status: AC
  Administered 2013-03-02: 40 mg
  Filled 2013-03-02: qty 2

## 2013-03-02 NOTE — ED Notes (Signed)
MD Tomi Bamberger made aware of lactic acid.

## 2013-03-02 NOTE — Procedures (Signed)
Central Venous Catheter Insertion Procedure Note [facility] [id number] 1932-12-03  Procedure: Insertion of Central Venous Catheter Indications: Assessment of intravascular volume, Drug and/or fluid administration and Frequent blood sampling  Procedure Details Consent: Risks of procedure as well as the alternatives and risks of each were explained to the (patient/caregiver).  Consent for procedure obtained. Time Out: Verified patient identification, verified procedure, site/side was marked, verified correct patient position, special equipment/implants available, medications/allergies/relevent history reviewed, required imaging and test results available.  Performed  Maximum sterile technique was used including antiseptics, cap, gloves, gown, hand hygiene, mask and sheet. Skin prep: Chlorhexidine; local anesthetic administered A antimicrobial bonded/coated triple lumen catheter was placed in the right femoral using the Seldinger technique. Ultrasound guidance used.yes Catheter placed to 20 cm. Blood aspirated via all 3 ports and then flushed x 3. Line sutured x 2 and dressing applied.  Evaluation Blood flow good Complications: No apparent complications with femoral placement.  Did attempt R IJ placement with central venous occlusion that halted guidewire insertion attempt.  Angiocath inserted over guidewire and sutured in place as temporary venous access until femoral cath could be inserted. Patient did tolerate procedure well.   Melissa A Holmes PA-S 03/02/2013  1600   I was present, supervised and scrubbed in for the procedure.  U/S used in placement.  Rush Farmer, M.D. [facility] Pulmonary/Critical Care Medicine. Pager: [phone]. After hours pager: [phone].

## 2013-03-02 NOTE — ED Notes (Signed)
Yesterday family noticed change in pt.s mental status after her dialysis treatment. Pt is from home.  Family reports that she is a post-op Lt. AKA .  Pt. Also had a GI bleed in mid January with blood transfusions.  Pt.  Opened her eyes to verbal stimuli.

## 2013-03-02 NOTE — Significant Event (Signed)
Potassium low >> will give 4 runs KCL IV.  Chesley Mires, MD 03/02/2013, 6:12 PM

## 2013-03-02 NOTE — Progress Notes (Signed)
Changed to place pt on 8cc/kg IBW per MD order

## 2013-03-02 NOTE — Procedures (Signed)
Central Venous Catheter Insertion Procedure Note Katrina Meyer [id number] 10-Oct-1932  Procedure: Insertion of Central Venous Catheter Indications: Assessment of intravascular volume, Drug and/or fluid administration and Frequent blood sampling  Procedure Details Consent: Risks of procedure as well as the alternatives and risks of each were explained to the (patient/caregiver).  Consent for procedure obtained. Time Out: Verified patient identification, verified procedure, site/side was marked, verified correct patient position, special equipment/implants available, medications/allergies/relevent history reviewed, required imaging and test results available.  Performed  Maximum sterile technique was used including antiseptics, cap, gloves, gown, hand hygiene, mask and sheet. Skin prep: Chlorhexidine; local anesthetic administered A antimicrobial bonded/coated triple lumen catheter was placed in the right internal jugular vein using the Seldinger technique.  Evaluation Blood flow good Complications: No apparent complications Patient did tolerate procedure well. Chest X-ray ordered to verify placement.  CXR: pending.  U/S used in placement.  YACOUB,WESAM 03/02/2013, 2:42 PM

## 2013-03-02 NOTE — Progress Notes (Signed)
Visited with patient and family while in ED. Per pt. daughter she notice a change in  Katrina Meyer mental status and health and pt.was brought to ED.  Daughter concerned that medical staff does not limit treatment to her mother because of age.  Daughter wants everything done and hoping to get her back to baseline.  Family 's Katrina Meyer with daughter and providing additional support.  I accompanied Doctor to speak with family. Pt. Going to [id number]. Escorted family to   Waiting area.  Offered encouragement, empathic listening and Provided emotional and spiritual support, promoted information sharing between staff and family  Will follow as needed.  03/02/13 1400  Clinical Encounter Type  Visited With Patient and family together;Health care provider  Visit Type Spiritual support;Critical Care;ED;Trauma  Referral From Nurse  Spiritual Encounters  Spiritual Needs Emotional  Stress Factors  Family Stress Factors Exhausted  [place], [place], [place]

## 2013-03-02 NOTE — ED Provider Notes (Signed)
CSN: [id number]     Arrival date & time 03/02/13  1245 History   None    Chief Complaint  Patient presents with  . Altered Mental Status   Level V caveat: Altered mental status, family not present at the bedside to provide history HPI Patient presents to the emergency room with altered mental status. The patient has history of multiple medical problems. She went to dialysis yesterday. Sometime later in the day the patient began having a decline in her mental status. The symptoms continued and progressed throughout the day according to EMS report. Family called 44 and have her evaluated the hospital. Patient also has a history of a left above the knee amputation as well as a GI bleed in the last 2 months Past Medical History  Diagnosis Date  . Hyperlipidemia     takes Lipitor daily  . DVT (deep venous thrombosis)   . Stroke   . Parkinson's disease     takes Sinemet daily  . Tibia/fibula fracture 01/04/2012    Treated with long leg casting. Cast change in hospital 02/27/12   . Peripheral vascular disease 02/27/2012    Skin ulcer of right great toe  . Atrial fibrillation 03/09/2012    Seen on older EKG as well but family not aware.   . Macrocytic anemia 03/09/2012  . Lower GI bleed 03/10/2012    hematochezia secondary to ulcerated polyps; diverticulosis; chronic diarrhea  . End stage renal disease on dialysis     [facility]- MWF  . Hypertension     takes Coreg daily  . Dementia     takes Aricept daily  . Diabetes mellitus, type II     takes Lantus and Novolog  . Seasonal allergies     takes Allegra daily  . Depression     takes Prozac daily  . Hypothyroidism     takes Synthroid daily   Past Surgical History  Procedure Laterality Date  . Dg av dialysis graft declot or    . Av fistula repair  Aug. 2013    Left arm  . Cholecystectomy    . Colon surgery      Aprrox 2006 at [facility] after she had a colonoscopy  . Colonoscopy  03/10/2012    Procedure: COLONOSCOPY;  Surgeon:  Rogene Houston, MD;  Location: [facility];  Service: Endoscopy;  Laterality: N/A;  . Esophagogastroduodenoscopy  03/10/2012    Procedure: ESOPHAGOGASTRODUODENOSCOPY (EGD);  Surgeon: Rogene Houston, MD;  Location: [facility];  Service: Endoscopy;  Laterality: N/A;  EGD if TCS normal.  . Ligation of arteriovenous  fistula Left 03/24/2012    Procedure: LIGATION OF ARTERIOVENOUS  FISTULA;  Surgeon: Conrad Manchester, MD;  Location: [place];  Service: Vascular;  Laterality: Left;  . Av fistula placement Right 04/28/2012    Procedure: INSERTION OF ARTERIOVENOUS (AV) GORE-TEX GRAFT ARM;  Surgeon: Elam Dutch, MD;  Location: [facility] OR;  Service: Vascular;  Laterality: Right;  . Leg amputation above knee Left   . Angioplast of lle  12/2012-01/2013    Several Procedures  . Esophagogastroduodenoscopy N/A 02/14/2013    Procedure: ESOPHAGOGASTRODUODENOSCOPY (EGD);  Surgeon: Milus Banister, MD;  Location: [place];  Service: Endoscopy;  Laterality: N/A;  . Cardiac catheterization  03/08/2004    Normal coronary arteries and normal LV function. Normal aortoiliac anatomy suitable for renal transplant.  . Transthoracic echocardiogram  02/07/2004    EF 60%, no evidence of significant valvular abnormalities.   Family History  Problem Relation Age of Onset  . Stroke Mother   . Diabetes Mother   . Cancer Mother   . Hypertension Mother   . Coronary artery disease Father   . Diabetes Sister    History  Substance Use Topics  . Smoking status: Passive Smoke Exposure - Never Smoker    Types: Cigarettes  . Smokeless tobacco: Never Used  . Alcohol Use: No   OB History   Grav Para Term Preterm Abortions TAB SAB Ect Mult Living                 Review of Systems  Unable to perform ROS: Mental status change    Allergies  Hydralazine  Home Medications   Current Outpatient Rx  Name  Route  Sig  Dispense  Refill  . atorvastatin (LIPITOR) 10 MG tablet   Oral   Take 10 mg by mouth every morning.           . calcium acetate, Phos Binder, (PHOSLYRA) 667 MG/5ML SOLN   Oral   Take 667-1,334 mg by mouth 3 (three) times daily with meals. Takes 39mls with snacks and 55mls with meals         . carbidopa-levodopa (SINEMET IR) 25-100 MG per tablet   Oral   Take 1.5 tablets by mouth 3 (three) times daily.         . carvedilol (COREG) 6.25 MG tablet   Oral   Take 3 tablets (18.75 mg total) by mouth 2 (two) times daily with a meal.   90 tablet   0   . cinacalcet (SENSIPAR) 30 MG tablet   Oral   Take 30 mg by mouth every evening.          . darbepoetin (ARANESP) 40 MCG/0.4ML SOLN injection   Intravenous   Inject 0.4 mLs (40 mcg total) into the vein every Monday with hemodialysis.   8.4 mL      . donepezil (ARICEPT) 10 MG tablet   Oral   Take 10 mg by mouth at bedtime.          Marland Kitchen doxercalciferol (HECTOROL) 4 MCG/2ML injection   Intravenous   Inject 1 mL (2 mcg total) into the vein every Monday, Wednesday, and Friday with hemodialysis.   2 mL      . feeding supplement, RESOURCE BREEZE, (RESOURCE BREEZE) LIQD   Oral   Take 1 Container by mouth daily.   1 Container   0     1 case please   . FLUoxetine (PROZAC) 40 MG capsule   Oral   Take 40 mg by mouth every morning.          Marland Kitchen levothyroxine (SYNTHROID, LEVOTHROID) 25 MCG tablet   Oral   Take 25 mcg by mouth every morning.         . Loratadine-Pseudoephedrine (CLARITIN-D 24 HOUR PO)   Oral   Take 1 tablet by mouth at bedtime.          . multivitamin (RENA-VIT) TABS tablet   Oral   Take 1 tablet by mouth 2 (two) times daily.          . Nutritional Supplements (FEEDING SUPPLEMENT, NEPRO CARB STEADY,) LIQD   Oral   Take 237 mLs by mouth daily.   1 Can   0     1 case please   . pantoprazole (PROTONIX) 40 MG tablet   Oral   Take 1 tablet (40 mg total) by mouth 2 (two) times daily before  a meal.   60 tablet   0   . sodium chloride 0.9 % SOLN 100 mL with ferric gluconate 12.5 MG/ML SOLN 62.5 mg    Intravenous   Inject 62.5 mg into the vein every Monday, Wednesday, and Friday with hemodialysis.          BP 68/46  Temp(Src) 98.4 F (36.9 C) (Core (Comment))  Resp 23  Ht 5\' 9"  (1.753 m)  Wt 142 lb (64.411 kg)  BMI 20.96 kg/m2  SpO2 100% Physical Exam  Constitutional: No distress.  HENT:  Head: Normocephalic and atraumatic.  Mouth/Throat: No oropharyngeal exudate.  Eyes: Pupils are equal, round, and reactive to light. Right eye exhibits no discharge. No scleral icterus.  Neck: Normal range of motion. No JVD present. No tracheal deviation present. No thyromegaly present.  Cardiovascular: Tachycardia present.  Exam reveals no gallop and no friction rub.   No murmur heard. Pulmonary/Chest: Effort normal and breath sounds normal. No stridor. No respiratory distress. She has no wheezes.  Abdominal: Soft. She exhibits distension. There is tenderness. There is rebound.  Musculoskeletal:  Status post AKA left knee, staples without erythema or discharge; decubitus ulcer stage II of the sacrum with erythema but no purulent drainage  Neurological: She is unresponsive.  The patient is moving all 4 extremities, she resists when I try to open her eyes, the patient has responded to voice by opening her eyes intermittently, otherwise she does not seem to be regularly following commands, she has not answering questions and is not speaking in the emergency department  Skin: Skin is warm and dry. No rash noted. She is not diaphoretic. No erythema.    ED Course  Procedures (including critical care time) Labs Review Labs Reviewed  CG4 I-STAT (LACTIC ACID) - Abnormal; Notable for the following:    Lactic Acid, Venous 7.93 (*)    All other components within normal limits  POCT I-STAT, CHEM 8 - Abnormal; Notable for the following:    BUN 31 (*)    Creatinine, Ser 3.90 (*)    Glucose, Bld 199 (*)    All other components within normal limits  POCT I-STAT 3, BLOOD GAS (G3+) - Abnormal; Notable  for the following:    pO2, Arterial 161.0 (*)    Bicarbonate 28.2 (*)    Acid-Base Excess 3.0 (*)    All other components within normal limits  CULTURE, BLOOD (ROUTINE X 2)  CULTURE, BLOOD (ROUTINE X 2)  CBC WITH DIFFERENTIAL  COMPREHENSIVE METABOLIC PANEL  LIPASE, BLOOD  PROTIME-INR  URINALYSIS, ROUTINE W REFLEX MICROSCOPIC  POCT I-STAT TROPONIN I  TYPE AND SCREEN   Imaging Review No results found. CT scan pending  EKG Interpretation    Date/Time:  Tuesday March 02 2013 12:55:44 EST Ventricular Rate:  135 PR Interval:    QRS Duration: 93 QT Interval:  358 QTC Calculation: 537 R Axis:   -36 Text Interpretation:  Atrial fibrillation LVH with secondary repolarization abnormality Prolonged QT interval No significant change since last tracing Confirmed by Yarieliz Wasser  MD-J, Alyx Gee ([id number]) on 03/02/2013 1:30:12 PM           Level 1 sepsis was activated based on her hypotension, tachycardia and elevated lactic acid level.  Differential still includes cardiac sources for her shock although this is less likely with her normal troponin.  She is not anemic.  Doubt hemorrhagic source.    I have now spoken to family.  Request full code status.  Dr Nelda Marseille is here from ICU.  He will admit the patient to the ICU for further treatment.    Medications  0.9 %  sodium chloride infusion (0 mLs Intravenous Stopped 03/02/13 1352)    Followed by  0.9 %  sodium chloride infusion (1,000 mLs Intravenous New Bag/Given 03/02/13 1310)    Followed by  0.9 %  sodium chloride infusion (1,000 mLs Intravenous New Bag/Given 03/02/13 1350)  vancomycin (VANCOCIN) 1,500 mg in sodium chloride 0.9 % 500 mL IVPB (1,500 mg Intravenous New Bag/Given 03/02/13 1351)  vancomycin (VANCOCIN) IVPB 750 mg/150 ml premix (not administered)  piperacillin-tazobactam (ZOSYN) IVPB 2.25 g (not administered)  lidocaine (cardiac) 100 mg/39ml (XYLOCAINE) 20 MG/ML injection 2% (not administered)  succinylcholine (ANECTINE) 20 MG/ML  injection (not administered)  rocuronium (ZEMURON) 50 MG/5ML injection (not administered)  etomidate (AMIDATE) 2 MG/ML injection (not administered)  piperacillin-tazobactam (ZOSYN) IVPB 3.375 g (3.375 g Intravenous New Bag/Given 03/02/13 1345)    MDM   1. Sepsis     No obvious source of infection other than possibly her decubitus. Could be bacteremia from dialysis.  Pt is still getting her 3 liter bolus.  If no response might need pressors and central line.  Currently oxygenating well but may require intubation.  Pt will be admitted to ICU for further treatment.  Kathalene Frames, MD 03/02/13 [phone]

## 2013-03-02 NOTE — ED Notes (Signed)
Report given to Katrice, RN.

## 2013-03-02 NOTE — Procedures (Signed)
Arterial Catheter Insertion Procedure Note Katrina Meyer [id number] [birth year]  Procedure: Insertion of Arterial Catheter  Indications: Blood pressure monitoring and Frequent blood sampling  Procedure Details Consent: Risks of procedure as well as the alternatives and risks of each were explained to the (patient/caregiver).  Consent for procedure obtained. Time Out: Verified patient identification, verified procedure, site/side was marked, verified correct patient position, special equipment/implants available, medications/allergies/relevent history reviewed, required imaging and test results available.  Performed  Maximum sterile technique was used including antiseptics, cap, gloves, gown, hand hygiene, mask and sheet. Skin prep: Chlorhexidine; local anesthetic administered 20 gauge catheter was inserted into respiratory using the Seldinger technique.  Evaluation Blood flow good; BP tracing good. Complications: No apparent complications.  Melissa A Holmes PA-S 03/02/2013, 4:25 PM  I was present and supervised the entire procedure, scrubbed in with him.  U/S used in placement.  Rush Farmer, M.D. [facility] Pulmonary/Critical Care Medicine. Pager: [phone]. After hours pager: [phone].

## 2013-03-02 NOTE — Significant Event (Signed)
ABG    Component Value Date/Time   PHART 7.547* 03/02/2013 1630   PCO2ART 21.9* 03/02/2013 1630   PO2ART 288.0* 03/02/2013 1630   HCO3 19.0* 03/02/2013 1630   TCO2 19.6 03/02/2013 1630   ACIDBASEDEF 3.2* 03/02/2013 1630   O2SAT 99.7 03/02/2013 1630    Will decrease RR from 16 to 12.  Chesley Mires, MD 03/02/2013, 6:54 PM

## 2013-03-02 NOTE — Progress Notes (Signed)
Tracheal aspirate collected, labeled, and sent to lab with approp requisition.

## 2013-03-02 NOTE — Procedures (Signed)
Arterial Catheter Insertion Procedure Note Katrina Meyer [id number] [birth year]  Procedure: Insertion of Arterial Catheter  Indications: Blood pressure monitoring and Frequent blood sampling  Procedure Details Consent: Unable to obtain consent because of emergent medical necessity. Time Out: Verified patient identification, verified procedure, site/side was marked, verified correct patient position, special equipment/implants available, medications/allergies/relevent history reviewed, required imaging and test results available.  Performed  Maximum sterile technique was used including antiseptics, cap, gloves, gown, hand hygiene, mask and sheet. Skin prep: Chlorhexidine; local anesthetic administered 20 gauge catheter was inserted into left femoral artery using the Seldinger technique.  Evaluation Blood flow good; BP tracing good. Complications: No apparent complications.  U/S used in placement.  Katrina Meyer 03/02/2013

## 2013-03-02 NOTE — ED Notes (Signed)
Results of lactic acid given to primary nurse Rose Fillers

## 2013-03-02 NOTE — Progress Notes (Signed)
Dr. Halford Chessman ordered vent to be changed to 12.

## 2013-03-02 NOTE — H&P (Signed)
Name: Katrina Meyer MRN: [id number] DOB: 1932-04-10    ADMISSION DATE:  03/02/2013 CONSULTATION DATE:  03/02/2013  REFERRING MD :  Dr. Tomi Bamberger PRIMARY SERVICE: PCCM  CHIEF COMPLAINT:  Unresponsive  BRIEF PATIENT DESCRIPTION: 80 year old female with PMH of ESRD who went to dialysis on the day PTP and was noted to be hypotensive in the hospital.  Patient was sent home.  At home patient was noted to be more lethargic and on the night PTP was noted to be unresponsive.  Written off as tired at this point and was put to bed.  On the day of presentation, patient was noted to be more lethargic and was brought to the ED for lethargy.  In the ED the patient was noted to be hypotensive with SBP in the 60's.  Code sepsis was called and PCCM was asked to admit.  SIGNIFICANT EVENTS / STUDIES:  1/27 AMS, septic shock.  LINES / TUBES: ET tube 1/27>>> R IJ TLC 1/27>>>  CULTURES: Blood 1/27>>> Urine 1/27>>> Sputum 1/27>>>  ANTIBIOTICS: Vancomycin 1/27>>> Zosyn 1/27>>>  PAST MEDICAL HISTORY :  Past Medical History  Diagnosis Date  . Hyperlipidemia     takes Lipitor daily  . DVT (deep venous thrombosis)   . Stroke   . Parkinson's disease     takes Sinemet daily  . Tibia/fibula fracture 01/04/2012    Treated with long leg casting. Cast change in hospital 02/27/12   . Peripheral vascular disease 02/27/2012    Skin ulcer of right great toe  . Atrial fibrillation 03/09/2012    Seen on older EKG as well but family not aware.   . Macrocytic anemia 03/09/2012  . Lower GI bleed 03/10/2012    hematochezia secondary to ulcerated polyps; diverticulosis; chronic diarrhea  . End stage renal disease on dialysis     [facility]- MWF  . Hypertension     takes Coreg daily  . Dementia     takes Aricept daily  . Diabetes mellitus, type II     takes Lantus and Novolog  . Seasonal allergies     takes Allegra daily  . Depression     takes Prozac daily  . Hypothyroidism     takes Synthroid daily    Past Surgical History  Procedure Laterality Date  . Dg av dialysis graft declot or    . Av fistula repair  Aug. 2013    Left arm  . Cholecystectomy    . Colon surgery      Aprrox 2006 at [facility] after she had a colonoscopy  . Colonoscopy  03/10/2012    Procedure: COLONOSCOPY;  Surgeon: Rogene Houston, MD;  [facility]: [facility];  Service: Endoscopy;  Laterality: N/A;  . Esophagogastroduodenoscopy  03/10/2012    Procedure: ESOPHAGOGASTRODUODENOSCOPY (EGD);  Surgeon: Rogene Houston, MD;  Location: [facility];  Service: Endoscopy;  Laterality: N/A;  EGD if TCS normal.  . Ligation of arteriovenous  fistula Left 03/24/2012    Procedure: LIGATION OF ARTERIOVENOUS  FISTULA;  Surgeon: Conrad Delight, MD;  Location: [place];  Service: Vascular;  Laterality: Left;  . Av fistula placement Right 04/28/2012    Procedure: INSERTION OF ARTERIOVENOUS (AV) GORE-TEX GRAFT ARM;  Surgeon: Elam Dutch, MD;  Location: [facility] OR;  Service: Vascular;  Laterality: Right;  . Leg amputation above knee Left   . Angioplast of lle  12/2012-01/2013    Several Procedures  . Esophagogastroduodenoscopy N/A 02/14/2013    Procedure: ESOPHAGOGASTRODUODENOSCOPY (EGD);  Surgeon: Milus Banister, MD;  Location: [place];  Service: Endoscopy;  Laterality: N/A;  . Cardiac catheterization  03/08/2004    Normal coronary arteries and normal LV function. Normal aortoiliac anatomy suitable for renal transplant.  . Transthoracic echocardiogram  02/07/2004    EF 60%, no evidence of significant valvular abnormalities.   Prior to Admission medications   Medication Sig Start Date End Date Taking? Authorizing Provider  atorvastatin (LIPITOR) 10 MG tablet Take 10 mg by mouth every morning.     Historical Provider, MD  calcium acetate, Phos Binder, (PHOSLYRA) 667 MG/5ML SOLN Take 667-1,334 mg by mouth 3 (three) times daily with meals. Takes 62mls with snacks and 20mls with meals    Historical Provider, MD  carbidopa-levodopa (SINEMET IR)  25-100 MG per tablet Take 1.5 tablets by mouth 3 (three) times daily.    Historical Provider, MD  carvedilol (COREG) 6.25 MG tablet Take 3 tablets (18.75 mg total) by mouth 2 (two) times daily with a meal. 02/17/13   Geradine Girt, DO  cinacalcet (SENSIPAR) 30 MG tablet Take 30 mg by mouth every evening.     Historical Provider, MD  darbepoetin (ARANESP) 40 MCG/0.4ML SOLN injection Inject 0.4 mLs (40 mcg total) into the vein every Monday with hemodialysis. 02/17/13   Geradine Girt, DO  donepezil (ARICEPT) 10 MG tablet Take 10 mg by mouth at bedtime.     Historical Provider, MD  doxercalciferol (HECTOROL) 4 MCG/2ML injection Inject 1 mL (2 mcg total) into the vein every Monday, Wednesday, and Friday with hemodialysis. 02/17/13   Geradine Girt, DO  feeding supplement, RESOURCE BREEZE, (RESOURCE BREEZE) LIQD Take 1 Container by mouth daily. 02/17/13   Geradine Girt, DO  FLUoxetine (PROZAC) 40 MG capsule Take 40 mg by mouth every morning.     Historical Provider, MD  levothyroxine (SYNTHROID, LEVOTHROID) 25 MCG tablet Take 25 mcg by mouth every morning.    Historical Provider, MD  Loratadine-Pseudoephedrine (CLARITIN-D 24 HOUR PO) Take 1 tablet by mouth at bedtime.     Historical Provider, MD  multivitamin (RENA-VIT) TABS tablet Take 1 tablet by mouth 2 (two) times daily.     Historical Provider, MD  Nutritional Supplements (FEEDING SUPPLEMENT, NEPRO CARB STEADY,) LIQD Take 237 mLs by mouth daily. 02/17/13   Geradine Girt, DO  pantoprazole (PROTONIX) 40 MG tablet Take 1 tablet (40 mg total) by mouth 2 (two) times daily before a meal. 02/17/13   Geradine Girt, DO  sodium chloride 0.9 % SOLN 100 mL with ferric gluconate 12.5 MG/ML SOLN 62.5 mg Inject 62.5 mg into the vein every Monday, Wednesday, and Friday with hemodialysis. 02/17/13   Geradine Girt, DO   Allergies  Allergen Reactions  . Hydralazine Rash    FAMILY HISTORY:  Family History  Problem Relation Age of Onset  . Stroke Mother   .  Diabetes Mother   . Cancer Mother   . Hypertension Mother   . Coronary artery disease Father   . Diabetes Sister    SOCIAL HISTORY:  reports that she has been passively smoking Cigarettes.  She has been smoking about 0.00 packs per day. She has never used smokeless tobacco. She reports that she does not drink alcohol or use illicit drugs.  REVIEW OF SYSTEMS:  Unattainable, patient is sedated and intubated.  SUBJECTIVE: Unresponsive, unable to answer questions or follow any command.  VITAL SIGNS: Temp:  [98.4 F (36.9 C)-99.7 F (37.6 C)] 98.4 F (36.9 C) (01/27 1330)  Resp:  [20-23] 23 (01/27 1402) BP: (68-83)/(38-46) 68/46 mmHg (01/27 1402) SpO2:  [100 %] 100 % (01/27 1402) Weight:  [64.411 kg (142 lb)] 64.411 kg (142 lb) (01/27 1300) HEMODYNAMICS:   VENTILATOR SETTINGS:   INTAKE / OUTPUT: Intake/Output   None     PHYSICAL EXAMINATION: General:  Chronically ill appearing female. Neuro:  Obtunded, withdraws to pain. HEENT:  [place]/AT, PERRL, EOM-I and DMM with dry oral secretions present in the back of the pharynx. Cardiovascular:  Regular but tachy. Lungs:  Diffuse upper airway sounds. Abdomen:  Soft, NT, ND and +BS. Musculoskeletal:  -edema and -tenderness.  L AKA. Skin:  Advanced stage decub with ok wound healing on the L AKA.  LABS:  CBC  Recent Labs Lab 03/02/13 1314  HGB 13.9  HCT 41.0   Coag's No results found for this basename: APTT, INR,  in the last 168 hours BMET  Recent Labs Lab 03/02/13 1314  NA 145  K 4.6  CL 104  BUN 31*  CREATININE 3.90*  GLUCOSE 199*   Electrolytes No results found for this basename: CALCIUM, MG, PHOS,  in the last 168 hours Sepsis Markers  Recent Labs Lab 03/02/13 1315  LATICACIDVEN 7.93*   ABG  Recent Labs Lab 03/02/13 1343  PHART 7.429  PCO2ART 42.5  PO2ART 161.0*   Liver Enzymes No results found for this basename: AST, ALT, ALKPHOS, BILITOT, ALBUMIN,  in the last 168 hours Cardiac Enzymes No results  found for this basename: TROPONINI, PROBNP,  in the last 168 hours Glucose No results found for this basename: GLUCAP,  in the last 168 hours  Imaging Dg Chest Port 1 View  03/02/2013   CLINICAL DATA:  Altered mental status.  EXAM: PORTABLE CHEST - 1 VIEW  COMPARISON:  Single view of the chest 02/14/2013.  FINDINGS: There is cardiomegaly without edema. The aorta is ectatic with atherosclerosis noted. Lungs are clear. No pneumothorax or pleural effusion. No focal bony abnormality.  IMPRESSION: Cardiomegaly without acute disease.   Electronically Signed   By: Inge Rise M.D.   On: 03/02/2013 14:17     CXR: Pending  ASSESSMENT / PLAN:  PULMONARY A: VDRF due to inability to protect airway. P:   - Intubate. - Mechanically ventilate. - F/U ABG and CXR.  CARDIOVASCULAR A: Septic shock with tachycardial. P:  - Place TLC. - Follow CVP. - See ID section. - IVF as needed.  RENAL A:  ESRD, HD done 1/26, no indication for dialysis at this point.   P:   - CMP. - Replace electrolytes as needed. - Will need to call renal in AM.  GASTROINTESTINAL A:  No active issues. P:   - TF per nutrition.  HEMATOLOGIC A:  Labs pending. P:  - F/U CBC.  INFECTIOUS A:  Likely decub infection with septic shock. P:   - Vanc/zosyn. - Pan culture. - Sepsis protocol.  ENDOCRINE A:  DM by history.   P:   - ICU hyperglycemia protocol.  NEUROLOGIC A:  AMS likely related to sepsis. P:   - Head CT. - PRN sedation. - Secure airway.  TODAY'S SUMMARY: Spoke with family extensively, they wish for full code for now.  Will intubate, begin sepsis protocol but modified given renal disease will attempt to control IV fluid use.  I have personally obtained a history, examined the patient, evaluated laboratory and imaging results, formulated the assessment and plan and placed orders.  CRITICAL CARE: The patient is critically ill with multiple organ systems  failure and requires high complexity  decision making for assessment and support, frequent evaluation and titration of therapies, application of advanced monitoring technologies and extensive interpretation of multiple databases. Critical Care Time devoted to patient care services described in this note is 45 minutes.   Rush Farmer, M.D. [facility] Pulmonary/Critical Care Medicine. Pager: [phone]. After hours pager: [phone].

## 2013-03-02 NOTE — Progress Notes (Signed)
ANTIBIOTIC CONSULT NOTE - INITIAL  Pharmacy Consult for Vancomycin and Zosyn Indication: sepsis  Allergies  Allergen Reactions  . Hydralazine Rash    Patient Measurements: Height: 5\' 9"  (175.3 cm) Weight: 142 lb (64.411 kg) IBW/kg (Calculated) : 66.2  Vital Signs: Temp: 99.7 F (37.6 C) (01/27 1255) Temp src: Rectal (01/27 1255) BP: 83/42 mmHg (01/27 1300) Intake/Output from previous day:   Intake/Output from this shift:    Labs: No results found for this basename: WBC, HGB, PLT, LABCREA, CREATININE,  in the last 72 hours Estimated Creatinine Clearance: 14 ml/min (by C-G formula based on Cr of 3.36). No results found for this basename: VANCOTROUGH, Corlis Leak, VANCORANDOM, GENTTROUGH, GENTPEAK, GENTRANDOM, TOBRATROUGH, TOBRAPEAK, TOBRARND, AMIKACINPEAK, AMIKACINTROU, AMIKACIN,  in the last 72 hours   Microbiology: Recent Results (from the past 720 hour(s))  MRSA PCR SCREENING     Status: Abnormal   Collection Time    02/13/13  5:37 AM      Result Value Range Status   MRSA by PCR POSITIVE (*) NEGATIVE Final   Comment:            The GeneXpert MRSA Assay (FDA     approved for NASAL specimens     only), is one component of a     comprehensive MRSA colonization     surveillance program. It is not     intended to diagnose MRSA     infection nor to guide or     monitor treatment for     MRSA infections.     RESULT CALLED TO, READ BACK BY AND VERIFIED WITH:     CALLED TO RN DON HOVANDER [phone]  THANEY    Medical History: Past Medical History  Diagnosis Date  . Hyperlipidemia     takes Lipitor daily  . DVT (deep venous thrombosis)   . Stroke   . Parkinson's disease     takes Sinemet daily  . Tibia/fibula fracture 01/04/2012    Treated with long leg casting. Cast change in hospital 02/27/12   . Peripheral vascular disease 02/27/2012    Skin ulcer of right great toe  . Atrial fibrillation 03/09/2012    Seen on older EKG as well but family not aware.   .  Macrocytic anemia 03/09/2012  . Lower GI bleed 03/10/2012    hematochezia secondary to ulcerated polyps; diverticulosis; chronic diarrhea  . End stage renal disease on dialysis     [facility]- MWF  . Hypertension     takes Coreg daily  . Dementia     takes Aricept daily  . Diabetes mellitus, type II     takes Lantus and Novolog  . Seasonal allergies     takes Allegra daily  . Depression     takes Prozac daily  . Hypothyroidism     takes Synthroid daily    Medications:  See electronic med rec  Assessment: 80 y.o. female presents with AMS. Recent hospitalization for GIB - d/c on 02/17/13. Pt with ESRD- last HD yesterday. To begin broad spectrum antibiotics (Vanc and Zosyn) for sepsis. Tm 99.7. Hypotension. Cx pending.   Goal of Therapy:  Vancomycin trough level 15-20 mcg/ml  Plan:  1. Vancomycin 1.5gm IV now then 750mg  IV qHD 2. Zosyn 3.375gm IV now over 30 minutes then 2.25gm IV q8h. 3. Will f/u micro data, pt's clinical condition, HD schedule, pre-HD level prn  Sherlon Handing, PharmD, BCPS Clinical pharmacist, pager [phone] 03/02/2013,1:06 PM

## 2013-03-02 NOTE — Procedures (Signed)
Intubation Procedure Note Katrina Meyer [id number] 1932-07-25  Procedure: Intubation Indications: Airway protection and maintenance  Procedure Details Consent: Unable to obtain consent because of emergent medical necessity. Time Out: Verified patient identification, verified procedure, site/side was marked, verified correct patient position, special equipment/implants available, medications/allergies/relevent history reviewed, required imaging and test results available.  Performed  Maximum sterile technique was used including antiseptics, gloves, hand hygiene and mask.  MAC    Evaluation Hemodynamic Status: BP stable throughout; O2 sats: stable throughout Patient's Current Condition: stable Complications: No apparent complications Patient did tolerate procedure well. Chest X-ray ordered to verify placement.  CXR: pending.   [name] 03/02/2013

## 2013-03-02 NOTE — ED Notes (Signed)
MD Jacoub at bedside.

## 2013-03-03 ENCOUNTER — Inpatient Hospital Stay (HOSPITAL_COMMUNITY): Payer: Medicare Other

## 2013-03-03 DIAGNOSIS — I4891 Unspecified atrial fibrillation: Secondary | ICD-10-CM

## 2013-03-03 LAB — POCT I-STAT 3, ART BLOOD GAS (G3+)
ACID-BASE DEFICIT: 2 mmol/L (ref 0.0–2.0)
Acid-Base Excess: 2 mmol/L (ref 0.0–2.0)
BICARBONATE: 21.4 meq/L (ref 20.0–24.0)
Bicarbonate: 22.3 mEq/L (ref 20.0–24.0)
Bicarbonate: 20.1 mEq/L (ref 20.0–24.0)
O2 Saturation: 100 %
O2 Saturation: 100 %
O2 Saturation: 100 %
PCO2 ART: 20.8 mmHg — AB (ref 35.0–45.0)
PH ART: 7.568 — AB (ref 7.350–7.450)
PO2 ART: 182 mmHg — AB (ref 80.0–100.0)
Patient temperature: 97.8
Patient temperature: 97.5
TCO2: 22 mmol/L (ref 0–100)
TCO2: 23 mmol/L (ref 0–100)
TCO2: 21 mmol/L (ref 0–100)
pCO2 arterial: 23.4 mmHg — ABNORMAL LOW (ref 35.0–45.0)
pCO2 arterial: 25.3 mmHg — ABNORMAL LOW (ref 35.0–45.0)
pH, Arterial: 7.637 (ref 7.350–7.450)
pH, Arterial: 7.507 — ABNORMAL HIGH (ref 7.350–7.450)
pO2, Arterial: 199 mmHg — ABNORMAL HIGH (ref 80.0–100.0)
pO2, Arterial: 200 mmHg — ABNORMAL HIGH (ref 80.0–100.0)

## 2013-03-03 LAB — CBC
HCT: 33 % — ABNORMAL LOW (ref 36.0–46.0)
HEMOGLOBIN: 10.4 g/dL — AB (ref 12.0–15.0)
MCH: 30.6 pg (ref 26.0–34.0)
MCHC: 31.5 g/dL (ref 30.0–36.0)
MCV: 97.1 fL (ref 78.0–100.0)
Platelets: 325 10*3/uL (ref 150–400)
RBC: 3.4 MIL/uL — ABNORMAL LOW (ref 3.87–5.11)
RDW: 17.5 % — ABNORMAL HIGH (ref 11.5–15.5)
WBC: 11.9 10*3/uL — ABNORMAL HIGH (ref 4.0–10.5)

## 2013-03-03 LAB — BASIC METABOLIC PANEL
BUN: 27 mg/dL — AB (ref 6–23)
CALCIUM: 9.5 mg/dL (ref 8.4–10.5)
CO2: 19 mEq/L (ref 19–32)
Chloride: 112 mEq/L (ref 96–112)
Creatinine, Ser: 3.61 mg/dL — ABNORMAL HIGH (ref 0.50–1.10)
GFR calc Af Amer: 13 mL/min — ABNORMAL LOW (ref >90)
GFR calc non Af Amer: 11 mL/min — ABNORMAL LOW (ref >90)
GLUCOSE: 139 mg/dL — AB (ref 70–99)
Potassium: 4.1 mEq/L (ref 3.7–5.3)
Sodium: 148 mEq/L — ABNORMAL HIGH (ref 137–147)

## 2013-03-03 LAB — GLUCOSE, CAPILLARY
GLUCOSE-CAPILLARY: 162 mg/dL — AB (ref 70–99)
GLUCOSE-CAPILLARY: 81 mg/dL (ref 70–99)
GLUCOSE-CAPILLARY: 159 mg/dL — AB (ref 70–99)
Glucose-Capillary: 156 mg/dL — ABNORMAL HIGH (ref 70–99)
Glucose-Capillary: 177 mg/dL — ABNORMAL HIGH (ref 70–99)
Glucose-Capillary: 83 mg/dL (ref 70–99)

## 2013-03-03 LAB — PHOSPHORUS: PHOSPHORUS: 1.6 mg/dL — AB (ref 2.3–4.6)

## 2013-03-03 LAB — PROTIME-INR
INR: 5.09 — AB (ref 0.00–1.49)
Prothrombin Time: 45 seconds — ABNORMAL HIGH (ref 11.6–15.2)

## 2013-03-03 LAB — TSH: TSH: 2.848 u[IU]/mL (ref 0.350–4.500)

## 2013-03-03 LAB — MAGNESIUM: Magnesium: 1.7 mg/dL (ref 1.5–2.5)

## 2013-03-03 LAB — APTT: APTT: 106 s — AB (ref 24–37)

## 2013-03-03 MED ORDER — SODIUM CHLORIDE 0.9 % IV SOLN
0.0000 ug/h | INTRAVENOUS | Status: DC
  Administered 2013-03-03: 150 ug/h via INTRAVENOUS
  Filled 2013-03-03: qty 50

## 2013-03-03 MED ORDER — AMIODARONE LOAD VIA INFUSION
150.0000 mg | Freq: Once | INTRAVENOUS | Status: AC
  Administered 2013-03-03: 150 mg via INTRAVENOUS
  Filled 2013-03-03: qty 83.34

## 2013-03-03 MED ORDER — DEXTROSE 5 % IV SOLN
INTRAVENOUS | Status: DC
  Administered 2013-03-03: 11:00:00 via INTRAVENOUS
  Administered 2013-03-04: 40 mL via INTRAVENOUS

## 2013-03-03 MED ORDER — AMIODARONE HCL IN DEXTROSE 360-4.14 MG/200ML-% IV SOLN
30.0000 mg/h | INTRAVENOUS | Status: DC
  Administered 2013-03-04 – 2013-03-05 (×3): 30 mg/h via INTRAVENOUS
  Filled 2013-03-03 (×9): qty 200

## 2013-03-03 MED ORDER — DIGOXIN 0.25 MG/ML IJ SOLN
0.5000 mg | Freq: Once | INTRAMUSCULAR | Status: AC
  Administered 2013-03-03: 0.5 mg via INTRAVENOUS
  Filled 2013-03-03: qty 2

## 2013-03-03 MED ORDER — PANTOPRAZOLE SODIUM 40 MG IV SOLR
40.0000 mg | Freq: Two times a day (BID) | INTRAVENOUS | Status: DC
  Administered 2013-03-03 – 2013-03-07 (×9): 40 mg via INTRAVENOUS
  Filled 2013-03-03 (×13): qty 40

## 2013-03-03 MED ORDER — VITAMIN K1 10 MG/ML IJ SOLN
5.0000 mg | Freq: Once | INTRAMUSCULAR | Status: AC
  Administered 2013-03-03: 5 mg via INTRAVENOUS
  Filled 2013-03-03: qty 0.5

## 2013-03-03 MED ORDER — SODIUM PHOSPHATE 3 MMOLE/ML IV SOLN
10.0000 mmol | Freq: Once | INTRAVENOUS | Status: AC
  Administered 2013-03-03: 10 mmol via INTRAVENOUS
  Filled 2013-03-03: qty 3.33

## 2013-03-03 MED ORDER — FENTANYL BOLUS VIA INFUSION
25.0000 ug | INTRAVENOUS | Status: DC | PRN
  Filled 2013-03-03: qty 50

## 2013-03-03 MED ORDER — FENTANYL CITRATE 0.05 MG/ML IJ SOLN: 50.0000 ug | INTRAMUSCULAR | Status: DC | PRN

## 2013-03-03 MED ORDER — AMIODARONE HCL IN DEXTROSE 360-4.14 MG/200ML-% IV SOLN
60.0000 mg/h | INTRAVENOUS | Status: AC
  Administered 2013-03-03 (×2): 60 mg/h via INTRAVENOUS
  Filled 2013-03-03 (×2): qty 200

## 2013-03-03 NOTE — Progress Notes (Signed)
Name: Katrina Meyer MRN: [id number] DOB: 03/18/1932    ADMISSION DATE:  03/02/2013 CONSULTATION DATE:  03/02/2013  REFERRING MD :  Dr. Tomi Bamberger PRIMARY SERVICE: PCCM  CHIEF COMPLAINT:  Unresponsive  BRIEF PATIENT DESCRIPTION: 80 year old female with PMH of ESRD- HD- MWF, parkinsons, A. Fib, HLD, HTN, dementia, who went to dialysis on the day PTP became hypotensive, lethargic and then unresponsive. Brought to the ED, Code sepsis was called and PCCM was asked to admit.  SIGNIFICANT EVENTS / STUDIES:  1/27 AMS, septic shock. 1/28- pressors improved  LINES / TUBES: ET tube 1/27>>> R IJ TLC 1/27>>>  CULTURES: Blood 1/27>>> Urine 1/27>>> Sputum 1/27>>>  ANTIBIOTICS: Vancomycin 1/27>>> Zosyn 1/27>>>  SUBJECTIVE: sedated, but responsive- opens eyes to verbal stimuli. ETT in place.  VITAL SIGNS: Temp:  [97.2 F (36.2 C)-99.7 F (37.6 C)] 99 F (37.2 C) (01/28 0854) Pulse Rate:  [46-132] 119 (01/28 1015) Resp:  [0-23] 0 (01/28 1015) BP: (68-153)/(29-106) 102/65 mmHg (01/28 0900) SpO2:  [87 %-100 %] 100 % (01/28 0915) Arterial Line BP: (88-186)/(48-94) 112/60 mmHg (01/28 1015) FiO2 (%):  [40 %-60 %] 40 % (01/28 0800) Weight:  [133 lb 13.1 oz (60.7 kg)-142 lb (64.411 kg)] 133 lb 13.1 oz (60.7 kg) (01/28 0530) HEMODYNAMICS: CVP:  [8 mmHg-15 mmHg] 10 mmHg VENTILATOR SETTINGS: Vent Mode:  [-] PRVC FiO2 (%):  [40 %-60 %] 40 % Set Rate:  [12 bmp-16 bmp] 12 bmp Vt Set:  [450 mL-530 mL] 530 mL PEEP:  [5 cmH20] 5 cmH20 Plateau Pressure:  [15 cmH20-19 cmH20] 15 cmH20 INTAKE / OUTPUT: Intake/Output     01/27 0701 - 01/28 0700 01/28 0701 - 01/29 0700   I.V. (mL/kg) 1449.8 (23.9) 300 (4.9)   IV Piggyback 1500    Total Intake(mL/kg) 2949.8 (48.6) 300 (4.9)   Total Output 0     Net +2949.8 +300          PHYSICAL EXAMINATION: General:  Sedated, ETT in place, on PRVC. Neuro:  Responds to verbal stimuli- Eye opening, does not follow commands. HEENT:  [place]/AT.  Cardiovascular:   S1, s2, irregular rate, tachycardic.  Lungs:  Diffuse upper airway sounds. Abdomen:  Soft, NT, ND and +BS. Musculoskeletal:   No pedal edema or tenderness.  L AKA. Skin:  Sacral decub, doesn't appear infected, with clean healing incision on with sutures on the L AKA.  LABS:  CBC  Recent Labs Lab 03/02/13 1340 03/02/13 1645 03/03/13 0415  WBC 11.1* 14.0* 11.9*  HGB 11.3* 10.7* 10.4*  HCT 36.4 34.1* 33.0*  PLT 309 326 325   Coag's  Recent Labs Lab 03/02/13 1340 03/02/13 1645  APTT  --  33  INR 1.60* 1.52*   BMET  Recent Labs Lab 03/02/13 1340 03/02/13 1645 03/03/13 0415  NA 151* 150* 148*  K 3.4* 2.2* 4.1  CL 106 119* 112  CO2 24 12* 19  BUN 24* 18 27*  CREATININE 3.45* 2.26* 3.61*  GLUCOSE 200* 175* 139*   Electrolytes  Recent Labs Lab 03/02/13 1340 03/02/13 1645 03/03/13 0415  CALCIUM 9.3 5.8* 9.5  MG  --  1.1* 1.7  PHOS  --  1.6* 1.6*   Sepsis Markers  Recent Labs Lab 03/02/13 1315 03/02/13 1645  LATICACIDVEN 7.93* 2.0   ABG  Recent Labs Lab 03/02/13 1343 03/02/13 1630  PHART 7.429 7.547*  PCO2ART 42.5 21.9*  PO2ART 161.0* 288.0*   Liver Enzymes  Recent Labs Lab 03/02/13 1340 03/02/13 1645  AST 30 21  ALT <5 <5  ALKPHOS 103 74  BILITOT 0.3 0.2*  ALBUMIN 1.7* 1.2*   Cardiac Enzymes  Recent Labs Lab 03/02/13 1645  PROBNP 13840.0*   Glucose  Recent Labs Lab 03/02/13 1542 03/02/13 1849 03/03/13 0004 03/03/13 0402 03/03/13 0758  GLUCAP 137* 228* 177* 162* 81    Imaging Ct Head Wo Contrast  03/02/2013   CLINICAL DATA:  Altered mental status  EXAM: CT HEAD WITHOUT CONTRAST  TECHNIQUE: Contiguous axial images were obtained from the base of the skull through the vertex without intravenous contrast.  COMPARISON:  Insert previous  FINDINGS: There is moderate diffuse cerebral and cerebellar atrophy. There is stable decreased density in the deep white matter of both cerebral hemispheres consistent with chronic small vessel  ischemic type change. There are calcifications within the middle cerebral arteries. There are hypodensities in the basal ganglia bilaterally consistent with previous ischemic infarctions. There is no evidence of an acute intracranial hemorrhage nor of an evolving ischemic infarction. There is stable encephalomalacia in the posterior aspect of the right cerebellar hemisphere.  At bone window settings there is no evidence of an acute skull fracture. There is mild mucoperiosteal thickening within the ethmoid sinus cells that is more conspicuous than in the past. No air-fluid levels are demonstrated.  IMPRESSION: 1. There is no evidence of an acute ischemic or hemorrhagic event. 2. There are stable changes consistent with chronic small vessel ischemia and previous ischemic infarctions. Encephalomalacia in the right cerebellar hemisphere is consistent with a previous ischemic insult. 3. There is moderate diffuse cerebral and cerebellar atrophy with compensatory ventriculomegaly which is stable.   Electronically Signed   By: David  Martinique   On: 03/02/2013 15:28   Dg Chest Port 1 View  03/03/2013   CLINICAL DATA:  Intubation, history Parkinson's, diabetes, hypertension, end-stage renal disease on dialysis, atrial fibrillation  EXAM: PORTABLE CHEST - 1 VIEW  COMPARISON:  Portable exam 0500 hr compared to 03/02/2013  FINDINGS: Tip of endotracheal tube projects 3.4 cm above carina.  Upper normal heart size.  Calcified tortuous thoracic aorta.  Slightly rotated to the right.  Pulmonary vascularity mediastinal contours otherwise normal.  Lungs clear.  No pleural effusion or pneumothorax.  IMPRESSION: No acute abnormalities.   Electronically Signed   By: Lavonia Dana M.D.   On: 03/03/2013 07:55   Dg Chest Portable 1 View  03/02/2013   CLINICAL DATA:  Post intubation, sepsis  EXAM: PORTABLE CHEST - 1 VIEW  COMPARISON:  03/02/2013  FINDINGS: Cardiomediastinal silhouette is stable. Atherosclerotic calcifications of thoracic  aorta again noted. Endotracheal tube in place with tip 2.1 cm above the carina. No diagnostic pneumothorax. No acute infiltrate or pulmonary edema.  IMPRESSION: No active disease. Endotracheal tube in place. No diagnostic pneumothorax.   Electronically Signed   By: Lahoma Crocker M.D.   On: 03/02/2013 15:16   Dg Chest Port 1 View  03/02/2013   CLINICAL DATA:  Altered mental status.  EXAM: PORTABLE CHEST - 1 VIEW  COMPARISON:  Single view of the chest 02/14/2013.  FINDINGS: There is cardiomegaly without edema. The aorta is ectatic with atherosclerosis noted. Lungs are clear. No pneumothorax or pleural effusion. No focal bony abnormality.  IMPRESSION: Cardiomegaly without acute disease.   Electronically Signed   By: Inge Rise M.D.   On: 03/02/2013 14:17    CXR: ETT in place, no acute abnormality, calcified  ASSESSMENT / PLAN:  PULMONARY A: VDRF due to inability to protect airway. P:   - Abg reviewed  from 1/27, MV was reduced, repeat abg -if to 40% peep from 8 to 5, then SBT x 30 min  -neuro status supports extubation likely -pcxr in am   CARDIOVASCULAR A: Septic shock  Afib (recent GI bleed) Met criteria rel AI P:  - Follow CVP 10-12, no furher bolus - See ID - Beta blocker when pressures are stable -keep rate less 115, successful thus far -no heparin with GI bleed recent -levophed to MAP goal met, now off -just off pressors, keep steroids same dose   RENAL A:  ESRD, HD done 1/26, no indication for dialysis at this point.  Dehydration-  Na- 148. P:   - CMP. - Replace electrolytes as needed. - Will need to call renal in AM. -Replace phosphorus - D5W, but gentle hydration  GASTROINTESTINAL A:  Hx of Gi bleed- Duodenitis/gastritis EGD- 02/14/2013 P:   - TF per nutrition, hold , start if not extubated - Gi recs on previous admission- BID protonix for 2 months.  HEMATOLOGIC A:  Leukocytosis Elevated PT-INR, not on Anticoag. P:  - Likely due to sepsis, ?focus. - F/U  CBC. -sub q hep, dc if inr greater 1.6, repeat coags  INFECTIOUS A: septic shock source unclear, decub unimpressive, r/o bacteremia on HD P:   - Vanc/zosyn maintain - Pan culture. - Blood cultures sent  ENDOCRINE A:  DM by history, rel AI, hypothyroidism P:   - ICU hyperglycemia protocol. -continues steroids -send TSh, fib,  Hold synthroid until tsh back  NEUROLOGIC A:  AMS likely related to sepsis- Head Ct neg for Acute infrarct or hemmorrhage. - dementia P:   -  - PRN sedation. - avoid benzo -upright  Bing Neighbors, MD, PGY-1. 03/03/2013 10:32 AM  I have fully examined this patient and agree with above findings.    And edited i nfull  Ccm time 30 in   Lavon Paganini. Titus Mould, MD, [place] Pgr: [facility]

## 2013-03-03 NOTE — Consult Note (Signed)
WOC wound consult note Reason for Consult: "decubs".  Pt from home, cared for by elderly husband.  She has LAKA.   She has apparently some areas on her right dorsal foot and right achilles area that are fully re-epithelialized and/or scabbed over.  She has a small Stage II at the gluteal cleft and a sDTI (suspected deep tissue injury) on her right trochanter. Unclear etiology for a linear full thickness area on her lateral right calf, could be pressure related to a device (FC tubing or otherwise), but does appear to be present for some time so more likely with hx of amputation on the left to be related to her circulation.  Wound type:Pressure ulcer. ? Arterial ulceration right LE Pressure Ulcer POA: Yes x 2 Measurement: R lateral: 2.0cm x 0.5cm x 0.2cm  R trochanter:1.5cm x 5cm x 0 Sacrum/gluteal cleft: 1.5cm x 0.5cm x 0.2cm   Wound bed: all of the wounds that are open are clean and do not appear infected.  She does a small amount of slough in the right lateral leg wound but not significant to need debridement. Drainage (amount, consistency, odor)minimal at all sites Periwound: intact Dressing procedure/placement/frequency: silicone foam will manage all these sites nicely, insulate, protect from further injury, promote moist wound healing.   Should the foam on the sacrum become an issue with more stooling could change to barrier cream over this areas since it is superficial.   Discussed POC with patient and bedside nurse.  Re consult if needed, will not follow at this time. Thanks  Casha Estupinan Kellogg, [place] [phone])

## 2013-03-03 NOTE — Progress Notes (Signed)
MD notified of ABG results, per MD- drop vt to 450 ml.

## 2013-03-03 NOTE — Progress Notes (Signed)
[facility] ADULT ICU REPLACEMENT PROTOCOL FOR AM LAB REPLACEMENT ONLY  The patient does not apply for the [facility] Adult ICU Electrolyte Replacment Protocol based on the criteria listed below:   1. Is GFR >/= 40 ml/min? no  Patient's GFR today is 11 2. Is urine output >/= 0.5 ml/kg/hr for the last 6 hours? no Patient's UOP is  ml/kg/hr 3. Is BUN < 60 mg/dL? yes  Patient's BUN today is 27 4. Abnormal electrolyte(s):Mag 1.7, P 1.6 5. Ordered repletion with: NA 6. If a panic level lab has been reported, has the CCM MD in charge been notified? yes.   Physician:  Dr Kathy Breach, Jaymie Mckiddy A 03/03/2013 5:21 AM

## 2013-03-03 NOTE — Progress Notes (Signed)
INITIAL NUTRITION ASSESSMENT  DOCUMENTATION CODES Per approved criteria  -Not Applicable   INTERVENTION:  If unable to extubate patient, recommend initiate TF via OGT with Nepro at 25 ml/h today; on day two, increase to goal rate of 30 ml/h with Prostat 30 ml BID to provide 1496 kcals, 88 gm protein, 523 ml free water daily. If able to extubate patient and TF still needed, recommend initiate TF via NGT with Nepro at 25 ml/h today; on day two, increase to goal rate of 45 ml/h to provide 1944 kcals, 87 gm protein, 436 ml free water daily.  NUTRITION DIAGNOSIS: Inadequate oral intake related to inability to eat as evidenced by NPO status.   Goal: Intake to meet >90% of estimated nutrition needs.  Monitor:  TF tolerance/adequacy, weight trend, labs, vent status.  Reason for Assessment: MD Consult for TF initiation and management.  80 y.o. female  Admitting Dx: Unresponsive  ASSESSMENT: 80 year old female with PMH of ESRD- HD- MWF, parkinsons, A. Fib, HLD, HTN, dementia, who went to dialysis on the day PTP became hypotensive, lethargic and then unresponsive. Brought to the ED, Code sepsis was called.  Per discussion with RN, physician is hopeful for extubation in the next few hours, therefore, OGT has not been placed yet.   Patient is currently intubated on ventilator support.  MV: 7.2 L/min Temp (24hrs), Avg:98.4 F (36.9 C), Min:97.2 F (36.2 C), Max:99.7 F (37.6 C)   Height: Ht Readings from Last 1 Encounters:  03/02/13 5\' 9"  (1.753 m)    Weight: Wt Readings from Last 1 Encounters:  03/03/13 133 lb 13.1 oz (60.7 kg)    Ideal Body Weight: 62 kg  % Ideal Body Weight: 98%  Wt Readings from Last 10 Encounters:  03/03/13 133 lb 13.1 oz (60.7 kg)  02/17/13 142 lb 3.2 oz (64.5 kg)  02/17/13 142 lb 3.2 oz (64.5 kg)  01/27/13 154 lb 5.2 oz (70 kg)  12/10/12 150 lb 0.6 oz (68.058 kg)  11/24/12 143 lb 4.8 oz (65 kg)  07/23/12 153 lb (69.4 kg)  06/11/12 151 lb (68.493  kg)  06/05/12 151 lb 7.3 oz (68.7 kg)  05/30/12 151 lb (68.493 kg)    Usual Body Weight: 142 lb  % Usual Body Weight: 94%  Suspect weight loss related to recent left AKA earlier this month.  BMI:  Body mass index is 19.75 kg/(m^2).  Estimated Nutritional Needs: Kcal: 1400 while intubated (1800-2100 once extubated) Protein: 80-90 gm Fluid: 1.2 L  Skin: left leg amputation site on thigh; right leg wound; left buttocks wound; right foot wound  Diet Order: NPO  EDUCATION NEEDS: -Education not appropriate at this time   Intake/Output Summary (Last 24 hours) at 03/03/13 1104 Last data filed at 03/03/13 1000  Gross per 24 hour  Intake 3249.82 ml  Output      0 ml  Net 3249.82 ml    Last BM: 1/28   Labs:   Recent Labs Lab 03/02/13 1340 03/02/13 1645 03/03/13 0415  NA 151* 150* 148*  K 3.4* 2.2* 4.1  CL 106 119* 112  CO2 24 12* 19  BUN 24* 18 27*  CREATININE 3.45* 2.26* 3.61*  CALCIUM 9.3 5.8* 9.5  MG  --  1.1* 1.7  PHOS  --  1.6* 1.6*  GLUCOSE 200* 175* 139*    CBG (last 3)   Recent Labs  03/03/13 0004 03/03/13 0402 03/03/13 0758  GLUCAP 177* 162* 81    Scheduled Meds: . antiseptic oral rinse  15 mL Mouth Rinse QID  . chlorhexidine  15 mL Mouth Rinse BID  . heparin subcutaneous  5,000 Units Subcutaneous Q8H  . hydrocortisone sodium succinate  50 mg Intravenous Q6H  . insulin aspart  2-6 Units Subcutaneous Q4H  . pantoprazole (PROTONIX) IV  40 mg Intravenous Q24H  . piperacillin-tazobactam (ZOSYN)  IV  2.25 g Intravenous Q8H  . sodium phosphate  Dextrose 5% IVPB  10 mmol Intravenous Once  . vancomycin  750 mg Intravenous Q M,W,F-HD    Continuous Infusions: . sodium chloride 1,000 mL (03/02/13 1350)  . dexmedetomidine    . dextrose    . norepinephrine (LEVOPHED) Adult infusion 1 mcg/min (03/03/13 0700)    Past Medical History  Diagnosis Date  . Hyperlipidemia     takes Lipitor daily  . DVT (deep venous thrombosis)   . Stroke   .  Parkinson's disease     takes Sinemet daily  . Tibia/fibula fracture 01/04/2012    Treated with long leg casting. Cast change in hospital 02/27/12   . Peripheral vascular disease 02/27/2012    Skin ulcer of right great toe  . Atrial fibrillation 03/09/2012    Seen on older EKG as well but family not aware.   . Macrocytic anemia 03/09/2012  . Lower GI bleed 03/10/2012    hematochezia secondary to ulcerated polyps; diverticulosis; chronic diarrhea  . End stage renal disease on dialysis     [facility]- MWF  . Hypertension     takes Coreg daily  . Dementia     takes Aricept daily  . Diabetes mellitus, type II     takes Lantus and Novolog  . Seasonal allergies     takes Allegra daily  . Depression     takes Prozac daily  . Hypothyroidism     takes Synthroid daily    Past Surgical History  Procedure Laterality Date  . Dg av dialysis graft declot or    . Av fistula repair  Aug. 2013    Left arm  . Cholecystectomy    . Colon surgery      Aprrox 2006 at [facility] after she had a colonoscopy  . Colonoscopy  03/10/2012    Procedure: COLONOSCOPY;  Surgeon: Rogene Houston, MD;  [facility];  Service: Endoscopy;  Laterality: N/A;  . Esophagogastroduodenoscopy  03/10/2012    Procedure: ESOPHAGOGASTRODUODENOSCOPY (EGD);  Surgeon: Rogene Houston, MD;  [facility];  Service: Endoscopy;  Laterality: N/A;  EGD if TCS normal.  . Ligation of arteriovenous  fistula Left 03/24/2012    Procedure: LIGATION OF ARTERIOVENOUS  FISTULA;  Surgeon: Conrad Hoffman, MD;  Location: [place];  Service: Vascular;  Laterality: Left;  . Av fistula placement Right 04/28/2012    Procedure: INSERTION OF ARTERIOVENOUS (AV) GORE-TEX GRAFT ARM;  Surgeon: Elam Dutch, MD;  Location: [facility] OR;  Service: Vascular;  Laterality: Right;  . Leg amputation above knee Left   . Angioplast of lle  12/2012-01/2013    Several Procedures  . Esophagogastroduodenoscopy N/A 02/14/2013    Procedure:  ESOPHAGOGASTRODUODENOSCOPY (EGD);  Surgeon: Milus Banister, MD;  Location: [place];  Service: Endoscopy;  Laterality: N/A;  . Cardiac catheterization  03/08/2004    Normal coronary arteries and normal LV function. Normal aortoiliac anatomy suitable for renal transplant.  . Transthoracic echocardiogram  02/07/2004    EF 60%, no evidence of significant valvular abnormalities.     Molli Barrows, RD, LDN, [place] Pager [phone] After Hours Pager  [phone]   

## 2013-03-03 NOTE — Clinical Documentation Improvement (Signed)
  Patient admitted in septic shock, intubated and vent. Note 1/28 states "VDRF due to inability to protect airway". Please document acute condition leading to Vent. Thank you.  Possible Clinical Conditions?  Acute Respiratory Failure Acute on Chronic Respiratory Failure Chronic Respiratory Failure Other Condition Cannot Clinically Determine   Thank You, Ezekiel Ina ,RN Clinical Documentation Specialist:  [phone]  [facility] Information Management

## 2013-03-03 NOTE — Consult Note (Signed)
Renal Service Consult Note [facility]  Katrina Meyer 03/03/2013 Roney Jaffe D  Requesting Physician:  Dr Nelda Marseille  Reason for Consult:  ESRD patient with septic shock HPI: The patient is a 80 y.o. year-old with hx of ESRD, CVA, Parkinson's disease, PVD, afib, HTN, dementia and depression.  She presented to ER yest with AMS. The day before on Monday she had some low BP's on dialysis and family noted confusion at home that evening.  She had a L AKA surgery in early Jan this year. In the ED BP was as low as 68/46. Temp was  99.7, WBC 11.9k. She rec'd IV saline bolus, IV abx with zosyn and vanc and was admitted to ICU. She required pressor support overnight but they have been weaned off and BP is now 107/50 on no pressors.  Patient is on the vent, intubated in the ED last night. CVP is 8-12.   Daughter is present, no new history given  Past Medical History  Past Medical History  Diagnosis Date  . Hyperlipidemia     takes Lipitor daily  . DVT (deep venous thrombosis)   . Stroke   . Parkinson's disease     takes Sinemet daily  . Tibia/fibula fracture 01/04/2012    Treated with long leg casting. Cast change in hospital 02/27/12   . Peripheral vascular disease 02/27/2012    Skin ulcer of right great toe  . Atrial fibrillation 03/09/2012    Seen on older EKG as well but family not aware.   . Macrocytic anemia 03/09/2012  . Lower GI bleed 03/10/2012    hematochezia secondary to ulcerated polyps; diverticulosis; chronic diarrhea  . End stage renal disease on dialysis     [facility]- MWF  . Hypertension     takes Coreg daily  . Dementia     takes Aricept daily  . Diabetes mellitus, type II     takes Lantus and Novolog  . Seasonal allergies     takes Allegra daily  . Depression     takes Prozac daily  . Hypothyroidism     takes Synthroid daily   Past Surgical History  Past Surgical History  Procedure Laterality Date  . Dg av dialysis graft declot or    . Av  fistula repair  Aug. 2013    Left arm  . Cholecystectomy    . Colon surgery      Aprrox 2006 at [facility] after she had a colonoscopy  . Colonoscopy  03/10/2012    Procedure: COLONOSCOPY;  Surgeon: Rogene Houston, MD;  [facility];  Service: Endoscopy;  Laterality: N/A;  . Esophagogastroduodenoscopy  03/10/2012    Procedure: ESOPHAGOGASTRODUODENOSCOPY (EGD);  Surgeon: Rogene Houston, MD;  [facility];  Service: Endoscopy;  Laterality: N/A;  EGD if TCS normal.  . Ligation of arteriovenous  fistula Left 03/24/2012    Procedure: LIGATION OF ARTERIOVENOUS  FISTULA;  Surgeon: Conrad Bolckow, MD;  Location: [place];  Service: Vascular;  Laterality: Left;  . Av fistula placement Right 04/28/2012    Procedure: INSERTION OF ARTERIOVENOUS (AV) GORE-TEX GRAFT ARM;  Surgeon: Elam Dutch, MD;  Location[facility];  Service: Vascular;  Laterality: Right;  . Leg amputation above knee Left   . Angioplast of lle  12/2012-01/2013    Several Procedures  . Esophagogastroduodenoscopy N/A 02/14/2013    Procedure: ESOPHAGOGASTRODUODENOSCOPY (EGD);  Surgeon: Milus Banister, MD;  Location: [place];  Service: Endoscopy;  Laterality: N/A;  .  Cardiac catheterization  03/08/2004    Normal coronary arteries and normal LV function. Normal aortoiliac anatomy suitable for renal transplant.  . Transthoracic echocardiogram  02/07/2004    EF 60%, no evidence of significant valvular abnormalities.   Family History  Family History  Problem Relation Age of Onset  . Stroke Mother   . Diabetes Mother   . Cancer Mother   . Hypertension Mother   . Coronary artery disease Father   . Diabetes Sister    Social History  reports that she has been passively smoking Cigarettes.  She has been smoking about 0.00 packs per day. She has never used smokeless tobacco. She reports that she does not drink alcohol or use illicit drugs. Allergies  Allergies  Allergen Reactions  . Hydralazine Rash  . Hydrochlorothiazide  Hives and Rash   Home medications Prior to Admission medications   Medication Sig Start Date End Date Taking? Authorizing Provider  atorvastatin (LIPITOR) 10 MG tablet Take 10 mg by mouth every morning.    Yes Historical Provider, MD  calcium acetate, Phos Binder, (PHOSLYRA) 667 MG/5ML SOLN Take 667-1,334 mg by mouth 3 (three) times daily with meals. Takes 34mls with snacks and 52mls with meals   Yes Historical Provider, MD  carbidopa-levodopa (SINEMET IR) 25-100 MG per tablet Take 1.5 tablets by mouth 3 (three) times daily.   Yes Historical Provider, MD  carvedilol (COREG) 6.25 MG tablet Take 3 tablets (18.75 mg total) by mouth 2 (two) times daily with a meal. 02/17/13  Yes Geradine Girt, DO  cinacalcet (SENSIPAR) 30 MG tablet Take 30 mg by mouth every evening.    Yes Historical Provider, MD  donepezil (ARICEPT) 10 MG tablet Take 10 mg by mouth at bedtime.    Yes Historical Provider, MD  feeding supplement, RESOURCE BREEZE, (RESOURCE BREEZE) LIQD Take 1 Container by mouth daily. 02/17/13  Yes Geradine Girt, DO  FLUoxetine (PROZAC) 40 MG capsule Take 40 mg by mouth every morning.    Yes Historical Provider, MD  levothyroxine (SYNTHROID, LEVOTHROID) 25 MCG tablet Take 25 mcg by mouth every morning.   Yes Historical Provider, MD  Loratadine-Pseudoephedrine (CLARITIN-D 24 HOUR PO) Take 1 tablet by mouth at bedtime.    Yes Historical Provider, MD  multivitamin (RENA-VIT) TABS tablet Take 1 tablet by mouth 2 (two) times daily.    Yes Historical Provider, MD  Nutritional Supplements (FEEDING SUPPLEMENT, NEPRO CARB STEADY,) LIQD Take 237 mLs by mouth daily. 02/17/13  Yes Geradine Girt, DO  pantoprazole (PROTONIX) 40 MG tablet Take 1 tablet (40 mg total) by mouth 2 (two) times daily before a meal. 02/17/13  Yes Geradine Girt, DO   Liver Function Tests  Recent Labs Lab 03/02/13 1340 03/02/13 1645  AST 30 21  ALT <5 <5  ALKPHOS 103 74  BILITOT 0.3 0.2*  PROT 5.0* 3.5*  ALBUMIN 1.7* 1.2*     Recent Labs Lab 03/02/13 1340  LIPASE 20   CBC  Recent Labs Lab 03/02/13 1340 03/02/13 1645 03/03/13 0415  WBC 11.1* 14.0* 11.9*  NEUTROABS 8.4* 11.1*  --   HGB 11.3* 10.7* 10.4*  HCT 36.4 34.1* 33.0*  MCV 100.3* 98.6 97.1  PLT 309 326 509   Basic Metabolic Panel  Recent Labs Lab 03/02/13 1314 03/02/13 1340 03/02/13 1645 03/03/13 0415  NA 145 151* 150* 148*  K 4.6 3.4* 2.2* 4.1  CL 104 106 119* 112  CO2  --  24 12* 19  GLUCOSE 199* 200* 175* 139*  BUN 31* 24* 18 27*  CREATININE 3.90* 3.45* 2.26* 3.61*  CALCIUM  --  9.3 5.8* 9.5  PHOS  --   --  1.6* 1.6*    Exam  Blood pressure 102/65, pulse 119, temperature 99 F (37.2 C), temperature source Oral, resp. rate 0, height 5\' 9"  (1.753 m), weight 60.7 kg (133 lb 13.1 oz), SpO2 100.00%. Elderly female on vent, sedated, unresponsive No rash, cyanosis or gangrene Sclera anicteric, throat w ETT in place No jvd Chest clear ant and lat Irregular displaced and sustained PMI, no gallop or M Abd nondistended, no mass or ascites Ext show L AKA stump clean and dry, RLE no ulcer or gangrene, sacral region examined no active decub R upper arm AVG patent and no signs of infectino Neuro is sedated on vent  Dialysis: [facility] MWF 3.5 hrs  2K/2.25 [place]   Heparin 2000   RUA AVG  Assessment 1. Shock, suspected sepsis; no sign of access infection on exam; off pressors, on IV abx; f//u cx results 2. ESRD, usual HD MWF 3. Anemia, Hb 10.4, get records 4. Dementia / Parkinson's disease / hx CVA 5. Debility- cared for by aides x 3 around the clock at home 6. L AKA Jan 2015 7. Chronic afib- on coreg, no coumadin 8. HTN/volume- BP's low, on coreg for afib 9. 2HPTH- cont sensipar when eating, hold binders given low phos 10. Hypernatremia- recommend D5W overnight, d/w resident 11. Hypophosphatemia- recommend IV [place], d/w resident   Gross HD today, reassess for HD tomorrow when hopefully will be more stable.   Kelly Splinter MD (pgr) [phone]    [phone] 03/03/2013, 10:51 AM

## 2013-03-03 NOTE — Progress Notes (Signed)
Attempted to SBT pt per Dr. Titus Mould request.  Pt w/ very low vt 250 ml, low min vol 2.5L.  Attempted to increase PS, but pt had several apnea episodes.  MD notified, pt placed back on full vent support.

## 2013-03-04 ENCOUNTER — Inpatient Hospital Stay (HOSPITAL_COMMUNITY): Payer: Medicare Other

## 2013-03-04 LAB — GLUCOSE, CAPILLARY
GLUCOSE-CAPILLARY: 111 mg/dL — AB (ref 70–99)
GLUCOSE-CAPILLARY: 122 mg/dL — AB (ref 70–99)
GLUCOSE-CAPILLARY: 141 mg/dL — AB (ref 70–99)
GLUCOSE-CAPILLARY: 143 mg/dL — AB (ref 70–99)
Glucose-Capillary: 109 mg/dL — ABNORMAL HIGH (ref 70–99)
Glucose-Capillary: 147 mg/dL — ABNORMAL HIGH (ref 70–99)
Glucose-Capillary: 155 mg/dL — ABNORMAL HIGH (ref 70–99)

## 2013-03-04 LAB — COMPREHENSIVE METABOLIC PANEL
ALBUMIN: 1.6 g/dL — AB (ref 3.5–5.2)
ALT: <5 U/L (ref 0–35)
AST: 15 U/L (ref 0–37)
Alkaline Phosphatase: 87 U/L (ref 39–117)
BILIRUBIN TOTAL: 0.2 mg/dL — AB (ref 0.3–1.2)
BUN: 30 mg/dL — ABNORMAL HIGH (ref 6–23)
CHLORIDE: 108 meq/L (ref 96–112)
CO2: 18 mEq/L — ABNORMAL LOW (ref 19–32)
Calcium: 9.5 mg/dL (ref 8.4–10.5)
Creatinine, Ser: 4 mg/dL — ABNORMAL HIGH (ref 0.50–1.10)
GFR calc Af Amer: 11 mL/min — ABNORMAL LOW (ref >90)
GFR calc non Af Amer: 10 mL/min — ABNORMAL LOW (ref >90)
Glucose, Bld: 163 mg/dL — ABNORMAL HIGH (ref 70–99)
POTASSIUM: 4.2 meq/L (ref 3.7–5.3)
Sodium: 144 mEq/L (ref 137–147)
TOTAL PROTEIN: 4.5 g/dL — AB (ref 6.0–8.3)

## 2013-03-04 LAB — POCT I-STAT 3, ART BLOOD GAS (G3+)
Acid-Base Excess: 5 mmol/L — ABNORMAL HIGH (ref 0.0–2.0)
Acid-base deficit: 5 mmol/L — ABNORMAL HIGH (ref 0.0–2.0)
Bicarbonate: 20 mEq/L (ref 20.0–24.0)
Bicarbonate: 27.9 mEq/L — ABNORMAL HIGH (ref 20.0–24.0)
O2 SAT: 100 %
O2 Saturation: 99 %
PCO2 ART: 36.1 mmHg (ref 35.0–45.0)
PH ART: 7.531 — AB (ref 7.350–7.450)
Patient temperature: 99
Patient temperature: 97.3
TCO2: 21 mmol/L (ref 0–100)
TCO2: 29 mmol/L (ref 0–100)
pCO2 arterial: 33.1 mmHg — ABNORMAL LOW (ref 35.0–45.0)
pH, Arterial: 7.353 (ref 7.350–7.450)
pO2, Arterial: 162 mmHg — ABNORMAL HIGH (ref 80.0–100.0)
pO2, Arterial: 150 mmHg — ABNORMAL HIGH (ref 80.0–100.0)

## 2013-03-04 LAB — CULTURE, RESPIRATORY W GRAM STAIN

## 2013-03-04 LAB — CULTURE, RESPIRATORY

## 2013-03-04 LAB — CBC
HCT: 29.6 % — ABNORMAL LOW (ref 36.0–46.0)
Hemoglobin: 9.4 g/dL — ABNORMAL LOW (ref 12.0–15.0)
MCH: 31 pg (ref 26.0–34.0)
MCHC: 31.8 g/dL (ref 30.0–36.0)
MCV: 97.7 fL (ref 78.0–100.0)
PLATELETS: 276 10*3/uL (ref 150–400)
RBC: 3.03 MIL/uL — ABNORMAL LOW (ref 3.87–5.11)
RDW: 17.5 % — AB (ref 11.5–15.5)
WBC: 11.7 10*3/uL — AB (ref 4.0–10.5)

## 2013-03-04 LAB — APTT: aPTT: 41 seconds — ABNORMAL HIGH (ref 24–37)

## 2013-03-04 LAB — PHOSPHORUS
PHOSPHORUS: 4.6 mg/dL (ref 2.3–4.6)
Phosphorus: 4.7 mg/dL — ABNORMAL HIGH (ref 2.3–4.6)

## 2013-03-04 LAB — HEPATITIS B SURFACE ANTIGEN: Hepatitis B Surface Ag: NEGATIVE

## 2013-03-04 LAB — MAGNESIUM: MAGNESIUM: 1.8 mg/dL (ref 1.5–2.5)

## 2013-03-04 LAB — PROTIME-INR
INR: 1.27 (ref 0.00–1.49)
PROTHROMBIN TIME: 15.6 s — AB (ref 11.6–15.2)

## 2013-03-04 MED ORDER — PENTAFLUOROPROP-TETRAFLUOROETH EX AERO: 1.0000 "application " | INHALATION_SPRAY | CUTANEOUS | Status: DC | PRN

## 2013-03-04 MED ORDER — SODIUM CHLORIDE 0.9 % IV SOLN: 100.0000 mL | INTRAVENOUS | Status: DC | PRN

## 2013-03-04 MED ORDER — LIDOCAINE HCL (PF) 1 % IJ SOLN: 5.0000 mL | INTRAMUSCULAR | Status: DC | PRN

## 2013-03-04 MED ORDER — DEXTROSE 5 % IV SOLN
1.0000 g | INTRAVENOUS | Status: AC
  Administered 2013-03-04 – 2013-03-08 (×5): 1 g via INTRAVENOUS
  Filled 2013-03-04 (×5): qty 10

## 2013-03-04 MED ORDER — VITAL HIGH PROTEIN PO LIQD
1000.0000 mL | ORAL | Status: DC
  Filled 2013-03-04 (×2): qty 1000

## 2013-03-04 MED ORDER — PRO-STAT SUGAR FREE PO LIQD
30.0000 mL | Freq: Two times a day (BID) | ORAL | Status: DC
  Administered 2013-03-04 – 2013-03-05 (×3): 30 mL
  Filled 2013-03-04 (×6): qty 30

## 2013-03-04 MED ORDER — NEPRO/CARBSTEADY PO LIQD
237.0000 mL | ORAL | Status: DC | PRN
  Administered 2013-03-05: 237 mL via ORAL
  Filled 2013-03-04: qty 237

## 2013-03-04 MED ORDER — ALTEPLASE 2 MG IJ SOLR
2.0000 mg | Freq: Once | INTRAMUSCULAR | Status: AC | PRN
  Filled 2013-03-04: qty 2

## 2013-03-04 MED ORDER — HEPARIN SODIUM (PORCINE) 1000 UNIT/ML DIALYSIS
2000.0000 [IU] | Freq: Once | INTRAMUSCULAR | Status: AC
  Administered 2013-03-04: 2000 [IU] via INTRAVENOUS_CENTRAL

## 2013-03-04 MED ORDER — HYDROCORTISONE SOD SUCCINATE 100 MG IJ SOLR
25.0000 mg | Freq: Four times a day (QID) | INTRAMUSCULAR | Status: DC
  Administered 2013-03-04 – 2013-03-05 (×3): 25 mg via INTRAVENOUS
  Filled 2013-03-04 (×7): qty 0.5

## 2013-03-04 MED ORDER — LEVOTHYROXINE SODIUM 25 MCG PO TABS
25.0000 ug | ORAL_TABLET | Freq: Every day | ORAL | Status: DC
  Administered 2013-03-04 – 2013-03-08 (×4): 25 ug
  Filled 2013-03-04 (×6): qty 1

## 2013-03-04 MED ORDER — HEPARIN SODIUM (PORCINE) 1000 UNIT/ML DIALYSIS
1000.0000 [IU] | INTRAMUSCULAR | Status: DC | PRN
  Filled 2013-03-04: qty 1

## 2013-03-04 MED ORDER — LIDOCAINE-PRILOCAINE 2.5-2.5 % EX CREA
1.0000 "application " | TOPICAL_CREAM | CUTANEOUS | Status: DC | PRN
  Filled 2013-03-04: qty 5

## 2013-03-04 MED ORDER — NEPRO/CARBSTEADY PO LIQD
1000.0000 mL | ORAL | Status: DC
  Administered 2013-03-04: 1000 mL
  Filled 2013-03-04 (×6): qty 1000

## 2013-03-04 NOTE — Progress Notes (Signed)
Name: Katrina Meyer MRN: [id number] DOB: 08/05/1932    ADMISSION DATE:  03/02/2013 CONSULTATION DATE:  03/02/2013  REFERRING MD :  Dr. Tomi Bamberger PRIMARY SERVICE: PCCM  BRIEF PATIENT DESCRIPTION: 80 year old female with PMH of ESRD- HD- MWF, parkinsons, A. Fib, HLD, HTN, dementia, who went to dialysis on the day PTP became hypotensive, lethargic and then unresponsive. Brought to the ED, Code sepsis was called and PCCM was asked to admit.  SIGNIFICANT EVENTS / STUDIES:  1/27 AMS, septic shock. 1/28- pressors improved  LINES / TUBES: ET tube 1/27>>> R IJ TLC 1/27>>>  CULTURES: Blood 1/27>>> Urine 1/27>>> Sputum 1/27>>>  ANTIBIOTICS: Vancomycin 1/27>>> Zosyn 1/27>>>  SUBJECTIVE: Opens eyes to verbal stimuli, does not follow comands.   VITAL SIGNS: Temp:  [97.2 F (36.2 C)-99 F (37.2 C)] 97.3 F (36.3 C) (01/29 0821) Pulse Rate:  [48-137] 78 (01/29 0600) Resp:  [0-23] 14 (01/29 0600) BP: (83-156)/(44-84) 130/59 mmHg (01/29 0500) SpO2:  [91 %-100 %] 100 % (01/29 0600) Arterial Line BP: (88-156)/(43-75) 127/59 mmHg (01/29 0600) FiO2 (%):  [30 %-40 %] 30 % (01/29 0600) Weight:  [141 lb 15.6 oz (64.4 kg)] 141 lb 15.6 oz (64.4 kg) (01/29 0250) HEMODYNAMICS: CVP:  [6 mmHg-13 mmHg] 6 mmHg VENTILATOR SETTINGS: Vent Mode:  [-] PRVC FiO2 (%):  [30 %-40 %] 30 % Set Rate:  [8 bmp-12 bmp] 8 bmp Vt Set:  [350 mL-530 mL] 350 mL PEEP:  [5 cmH20] 5 cmH20 Pressure Support:  [5 cmH20] 5 cmH20 Plateau Pressure:  [9 cmH20-15 cmH20] 11 cmH20 INTAKE / OUTPUT: Intake/Output     01/28 0701 - 01/29 0700 01/29 0701 - 01/30 0700   I.V. (mL/kg) 1546.2 (24)    IV Piggyback 452    Total Intake(mL/kg) 1998.2 (31)    Total Output       Net +1998.2            PHYSICAL EXAMINATION: General:  Sedated, ETT in place, on PRVC. Neuro:  Responds to verbal stimuli- Eye opening, does not follow commands. HEENT:  [place]/AT.  Cardiovascular:  S1, s2, irregular rate, tachycardic, in A fib- per tele.    Lungs:  Diffuse upper airway sounds. Abdomen:  Soft, NT, ND and +BS. Musculoskeletal:   No pedal edema or tenderness.  L AKA. Skin:  Sacral decub, doesn't appear infected, clean. Clean healing incision on with sutures on the L AKA.  LABS:  CBC  Recent Labs Lab 03/02/13 1645 03/03/13 0415 03/04/13 0255  WBC 14.0* 11.9* 11.7*  HGB 10.7* 10.4* 9.4*  HCT 34.1* 33.0* 29.6*  PLT 326 325 276   Coag's  Recent Labs Lab 03/02/13 1645 03/03/13 1125 03/04/13 0255  APTT 33 106* 41*  INR 1.52* 5.09* 1.27   BMET  Recent Labs Lab 03/02/13 1645 03/03/13 0415 03/04/13 0255  NA 150* 148* 144  K 2.2* 4.1 4.2  CL 119* 112 108  CO2 12* 19 18*  BUN 18 27* 30*  CREATININE 2.26* 3.61* 4.00*  GLUCOSE 175* 139* 163*   Electrolytes  Recent Labs Lab 03/02/13 1645 03/03/13 0415 03/04/13 0255  CALCIUM 5.8* 9.5 9.5  MG 1.1* 1.7  --   PHOS 1.6* 1.6* 4.6   Sepsis Markers  Recent Labs Lab 03/02/13 1315 03/02/13 1645  LATICACIDVEN 7.93* 2.0   ABG  Recent Labs Lab 03/03/13 1214 03/03/13 1909 03/03/13 2100  PHART 7.568* 7.637* 7.507*  PCO2ART 23.4* 20.8* 25.3*  PO2ART 199.0* 182.0* 200.0*   Liver Enzymes  Recent Labs Lab  03/02/13 1340 03/02/13 1645 03/04/13 0255  AST 30 21 15   ALT <5 <5 <5  ALKPHOS 103 74 87  BILITOT 0.3 0.2* 0.2*  ALBUMIN 1.7* 1.2* 1.6*   Cardiac Enzymes  Recent Labs Lab 03/02/13 1645  PROBNP 13840.0*   Glucose  Recent Labs Lab 03/03/13 1139 03/03/13 1553 03/03/13 1922 03/04/13 0008 03/04/13 0401 03/04/13 0749  GLUCAP 83 156* 159* 143* 155* 122*    Imaging Ct Head Wo Contrast  03/02/2013   CLINICAL DATA:  Altered mental status  EXAM: CT HEAD WITHOUT CONTRAST  TECHNIQUE: Contiguous axial images were obtained from the base of the skull through the vertex without intravenous contrast.  COMPARISON:  Insert previous  FINDINGS: There is moderate diffuse cerebral and cerebellar atrophy. There is stable decreased density in the deep  white matter of both cerebral hemispheres consistent with chronic small vessel ischemic type change. There are calcifications within the middle cerebral arteries. There are hypodensities in the basal ganglia bilaterally consistent with previous ischemic infarctions. There is no evidence of an acute intracranial hemorrhage nor of an evolving ischemic infarction. There is stable encephalomalacia in the posterior aspect of the right cerebellar hemisphere.  At bone window settings there is no evidence of an acute skull fracture. There is mild mucoperiosteal thickening within the ethmoid sinus cells that is more conspicuous than in the past. No air-fluid levels are demonstrated.  IMPRESSION: 1. There is no evidence of an acute ischemic or hemorrhagic event. 2. There are stable changes consistent with chronic small vessel ischemia and previous ischemic infarctions. Encephalomalacia in the right cerebellar hemisphere is consistent with a previous ischemic insult. 3. There is moderate diffuse cerebral and cerebellar atrophy with compensatory ventriculomegaly which is stable.   Electronically Signed   By: David  Martinique   On: 03/02/2013 15:28   Dg Chest Port 1 View  03/04/2013   CLINICAL DATA:  Respiratory difficulty  EXAM: PORTABLE CHEST - 1 VIEW  COMPARISON:  Yesterday  FINDINGS: Stable endotracheal tube. Lungs clear. Upper normal heart size. No pneumothorax.  IMPRESSION: Stable endotracheal tube with clear lungs.   Electronically Signed   By: Maryclare Bean M.D.   On: 03/04/2013 07:59   Dg Chest Port 1 View  03/03/2013   CLINICAL DATA:  Intubation, history Parkinson's, diabetes, hypertension, end-stage renal disease on dialysis, atrial fibrillation  EXAM: PORTABLE CHEST - 1 VIEW  COMPARISON:  Portable exam 0500 hr compared to 03/02/2013  FINDINGS: Tip of endotracheal tube projects 3.4 cm above carina.  Upper normal heart size.  Calcified tortuous thoracic aorta.  Slightly rotated to the right.  Pulmonary vascularity  mediastinal contours otherwise normal.  Lungs clear.  No pleural effusion or pneumothorax.  IMPRESSION: No acute abnormalities.   Electronically Signed   By: Lavonia Dana M.D.   On: 03/03/2013 07:55   Dg Chest Portable 1 View  03/02/2013   CLINICAL DATA:  Post intubation, sepsis  EXAM: PORTABLE CHEST - 1 VIEW  COMPARISON:  03/02/2013  FINDINGS: Cardiomediastinal silhouette is stable. Atherosclerotic calcifications of thoracic aorta again noted. Endotracheal tube in place with tip 2.1 cm above the carina. No diagnostic pneumothorax. No acute infiltrate or pulmonary edema.  IMPRESSION: No active disease. Endotracheal tube in place. No diagnostic pneumothorax.   Electronically Signed   By: Lahoma Crocker M.D.   On: 03/02/2013 15:16   Dg Chest Port 1 View  03/02/2013   CLINICAL DATA:  Altered mental status.  EXAM: PORTABLE CHEST - 1 VIEW  COMPARISON:  Single view  of the chest 02/14/2013.  FINDINGS: There is cardiomegaly without edema. The aorta is ectatic with atherosclerosis noted. Lungs are clear. No pneumothorax or pleural effusion. No focal bony abnormality.  IMPRESSION: Cardiomegaly without acute disease.   Electronically Signed   By: Inge Rise M.D.   On: 03/02/2013 14:17    CXR 1/29 : ETT in place, no acute abnormality, calcified aorta, increased interstitial markings.  ASSESSMENT / PLAN:  PULMONARY A: Acute respiratory failure due to inability to protect airway. Respiratory alkalosis- On vent, rate now reduced to 8. P:   - AbG now, adjust vent settings as indicated, if normal, wean trial today, cpap 5 pas 5, when off fentanyl -apnea larms -pcxr in am -increase in edema, for HD -pcxr in am   CARDIOVASCULAR A: Septic shock  Afib (recent GI bleed)- not on anticoag Mets criteria rel AI P:  - See ID - Beta blocker when pressures are stable - keep rate less 115, successful thus far-rate controlled - Off pressors - Cont steroids same dose- reduce 25 q6h  RENAL A:  ESRD, HD done 1/26,    Dehydration-  Na- 148. P:   - CMP. - Replace electrolytes as needed. - Nephrology following, consider hd for pre weaning  GASTROINTESTINAL A:  Hx of Gi bleed- Duodenitis/gastritis EGD- 02/14/2013 P:   - TF per nutrition- commence if not extubated - BID protonix for 2 months.  HEMATOLOGIC A:  Leukocytosis Elevated PT-INR, not on Anticoag- Now normalized error? P:  - Leukocytosis and abnormal coags- Likely due to sepsis, ?focus. - F/U CBC- daily. - Scds -coags in am   INFECTIOUS A: septic shock source unclear, decub unimpressive, r/o bacteremia on HD P:   - Vanc/zosyn dc -add ceftriaxone - Blood cultures sent  ENDOCRINE A:  DM by history, rel AI, hypothyroidism P:   - ICU hyperglycemia protocol. - Continue steroids but reduce - TSH normal, can restart home synthroid  NEUROLOGIC A:  AMS likely related to sepsis- Head Ct neg for Acute infrarct or hemmorrhage. - H/o dementia - H/o Parkinsons P:   - Fentanyl wean to off re assess alk -upright - avoid benzo  Bing Neighbors, MD, PGY-1. 03/04/2013 8:27 AM  I have fully examined this patient and agree with above findings.    And edited i nfull  Ccm time 30 in   Lavon Paganini. Titus Mould, MD, [place] Pgr: [facility]

## 2013-03-04 NOTE — Progress Notes (Signed)
[facility] Progress Note   Subjective: No real change overnight, remains off pressors, iv amio for afib, BP's better  Filed Vitals:   03/04/13 0500 03/04/13 0600 03/04/13 0821 03/04/13 0844  BP: 130/59   138/55  Pulse: 83 78  82  Temp:   97.3 F (36.3 C)   TempSrc:   Oral   Resp: 17 14  16   Height:      Weight:      SpO2: 100% 100%  100%  Exam On vent, sedated, eyes open off and on, no purposeful response No jvd  Chest clear ant and lat  Irregular, displaced and sustained PMI, no gallop or M  Abd nondistended, no mass or ascites  L AKA stump clean and dry, RLE no ulcer or gangrene, sacral region no skin breakdown RUE AVG patent Neuro is sedated on vent  Dialysis: [facility] MWF  3.5 hrs 59kg  2K/2.25 Bath Heparin 2000 RUA AVG  EPO 4600   Venofer 50 each HD   Hect none  Assessment  1. Shock, suspected sepsis- on empiric abx, blood cx's neg, cxr clear 2. ESRD, HD today then tomorrow to get back on schedule 3. Anemia of CKD, Hb down 9.4, start darbe 100/wk, IV Fe 4. Dementia / Parkinson's disease / hx CVA 5. Debility- cared for by aides x 3 around the clock at home 6. L AKA Jan 2015 7. Chronic afib- on coreg, IV amio, no anticoagulation  8. HTN/volume- BP's low, on coreg for afib; up 2-5 kg by wts, UF 2kg as tol today 9. 2HPTH- low phos , holding binders, replacing; cont sensipar when eating 10. Hypernatremia- better, cont D5W at 50 /hr for now, stopped NS at [place] MD pager [phone]    cell [phone] 03/04/2013, 9:00 AM   Recent Labs Lab 03/02/13 1645 03/03/13 0415 03/04/13 0255  NA 150* 148* 144  K 2.2* 4.1 4.2  CL 119* 112 108  CO2 12* 19 18*  GLUCOSE 175* 139* 163*  BUN 18 27* 30*  CREATININE 2.26* 3.61* 4.00*  CALCIUM 5.8* 9.5 9.5  PHOS 1.6* 1.6* 4.6    Recent Labs Lab 03/02/13 1340 03/02/13 1645 03/04/13 0255  AST 30 21 15   ALT <5 <5 <5  ALKPHOS 103 74 87  BILITOT 0.3 0.2* 0.2*  PROT 5.0* 3.5* 4.5*  ALBUMIN 1.7*  1.2* 1.6*    Recent Labs Lab 03/02/13 1340 03/02/13 1645 03/03/13 0415 03/04/13 0255  WBC 11.1* 14.0* 11.9* 11.7*  NEUTROABS 8.4* 11.1*  --   --   HGB 11.3* 10.7* 10.4* 9.4*  HCT 36.4 34.1* 33.0* 29.6*  MCV 100.3* 98.6 97.1 97.7  PLT 309 326 325 276   . antiseptic oral rinse  15 mL Mouth Rinse QID  . chlorhexidine  15 mL Mouth Rinse BID  . hydrocortisone sodium succinate  50 mg Intravenous Q6H  . insulin aspart  2-6 Units Subcutaneous Q4H  . pantoprazole (PROTONIX) IV  40 mg Intravenous Q12H  . piperacillin-tazobactam (ZOSYN)  IV  2.25 g Intravenous Q8H  . vancomycin  750 mg Intravenous Q M,W,F-HD   . sodium chloride 1,000 mL (03/02/13 1350)  . amiodarone (NEXTERONE PREMIX) 360 mg/200 mL dextrose 30 mg/hr (03/04/13 0304)  . dextrose 40 mL/hr at 03/03/13 1126  . fentaNYL infusion INTRAVENOUS 50 mcg/hr (03/04/13 0700)  . norepinephrine (LEVOPHED) Adult infusion 2 mcg/min (03/03/13 1600)   acetaminophen (TYLENOL) oral liquid 160 mg/5 mL, fentaNYL, sodium chloride

## 2013-03-04 NOTE — Progress Notes (Signed)
NUTRITION CONSULT / FOLLOW UP  Intervention:    Initiate TF via OGT with Nepro at 25 ml/h with Prostat 30 ml BID today; on day two, increase to goal rate of 30 ml/h to provide 1496 kcals, 88 gm protein, 523 ml free water daily.  Nutrition Dx:   Inadequate oral intake related to inability to eat as evidenced by NPO status.  Goal:   Intake to meet >90% of estimated nutrition needs.  Monitor:   TF tolerance/adequacy, weight trend, labs, vent status.  Assessment:   80 year old female with PMH of ESRD- HD- MWF, parkinsons, A. Fib, HLD, HTN, dementia, who went to dialysis on the day PTP became hypotensive, lethargic and then unresponsive. Brought to the ED, Code sepsis was called.  Discussed patient in ICU rounds today. Potential for weaning after HD today. Plans for HD again tomorrow to get back on M-W-F schedule. Received MD Consult for TF initiation and management.  Patient is currently intubated on ventilator support.  MV: 6.5 L/min Temp (24hrs), Avg:97.5 F (36.4 C), Min:97 F (36.1 C), Max:98.5 F (36.9 C)   Height: Ht Readings from Last 1 Encounters:  03/02/13 5\' 9"  (1.753 m)    Weight Status:   Wt Readings from Last 1 Encounters:  03/04/13 141 lb 15.6 oz (64.4 kg)  03/03/13  133 lb 13.1 oz (60.7 kg)   Re-estimated needs:  Kcal: 1275 Protein: 80-90 gm Fluid: 1.2 L  Skin: left leg amputation site on thigh; right leg wound; left buttocks wound; right foot wound  Diet Order: NPO   Intake/Output Summary (Last 24 hours) at 03/04/13 1300 Last data filed at 03/04/13 1200  Gross per 24 hour  Intake 1969.37 ml  Output      0 ml  Net 1969.37 ml    Last BM: 1/29   Labs:   Recent Labs Lab 03/02/13 1645 03/03/13 0415 03/04/13 0255 03/04/13 0500  NA 150* 148* 144  --   K 2.2* 4.1 4.2  --   CL 119* 112 108  --   CO2 12* 19 18*  --   BUN 18 27* 30*  --   CREATININE 2.26* 3.61* 4.00*  --   CALCIUM 5.8* 9.5 9.5  --   MG 1.1* 1.7  --  1.8  PHOS 1.6* 1.6* 4.6  4.7*  GLUCOSE 175* 139* 163*  --     CBG (last 3)   Recent Labs  03/04/13 0401 03/04/13 0749 03/04/13 1149  GLUCAP 155* 122* 141*    Scheduled Meds: . antiseptic oral rinse  15 mL Mouth Rinse QID  . cefTRIAXone (ROCEPHIN)  IV  1 g Intravenous Q24H  . chlorhexidine  15 mL Mouth Rinse BID  . feeding supplement (PRO-STAT SUGAR FREE 64)  30 mL Per Tube BID  . feeding supplement (VITAL HIGH PROTEIN)  1,000 mL Per Tube Q24H  . hydrocortisone sodium succinate  25 mg Intravenous Q6H  . insulin aspart  2-6 Units Subcutaneous Q4H  . levothyroxine  25 mcg Per Tube QAC breakfast  . pantoprazole (PROTONIX) IV  40 mg Intravenous Q12H    Continuous Infusions: . amiodarone (NEXTERONE PREMIX) 360 mg/200 mL dextrose 30 mg/hr (03/04/13 0304)  . dextrose 40 mL/hr at 03/03/13 1126  . fentaNYL infusion INTRAVENOUS Stopped (03/04/13 1130)  . norepinephrine (LEVOPHED) Adult infusion 2 mcg/min (03/03/13 1600)    Molli Barrows, RD, LDN, [employer] Pager [phone] After Hours Pager [phone]

## 2013-03-05 ENCOUNTER — Inpatient Hospital Stay (HOSPITAL_COMMUNITY): Payer: Medicare Other

## 2013-03-05 LAB — POCT I-STAT 3, ART BLOOD GAS (G3+)
ACID-BASE EXCESS: 6 mmol/L — AB (ref 0.0–2.0)
BICARBONATE: 29.4 meq/L — AB (ref 20.0–24.0)
O2 Saturation: 100 %
PH ART: 7.524 — AB (ref 7.350–7.450)
PO2 ART: 148 mmHg — AB (ref 80.0–100.0)
Patient temperature: 98.6
TCO2: 30 mmol/L (ref 0–100)
pCO2 arterial: 35.7 mmHg (ref 35.0–45.0)

## 2013-03-05 LAB — GLUCOSE, CAPILLARY
GLUCOSE-CAPILLARY: 163 mg/dL — AB (ref 70–99)
GLUCOSE-CAPILLARY: 122 mg/dL — AB (ref 70–99)
GLUCOSE-CAPILLARY: 126 mg/dL — AB (ref 70–99)
Glucose-Capillary: 144 mg/dL — ABNORMAL HIGH (ref 70–99)
Glucose-Capillary: 209 mg/dL — ABNORMAL HIGH (ref 70–99)

## 2013-03-05 LAB — BASIC METABOLIC PANEL
BUN: 14 mg/dL (ref 6–23)
CALCIUM: 8.2 mg/dL — AB (ref 8.4–10.5)
CHLORIDE: 96 meq/L (ref 96–112)
CO2: 23 mEq/L (ref 19–32)
Creatinine, Ser: 2.11 mg/dL — ABNORMAL HIGH (ref 0.50–1.10)
GFR calc Af Amer: 25 mL/min — ABNORMAL LOW (ref >90)
GFR calc non Af Amer: 21 mL/min — ABNORMAL LOW (ref >90)
GLUCOSE: 220 mg/dL — AB (ref 70–99)
Potassium: 3.1 mEq/L — ABNORMAL LOW (ref 3.7–5.3)
Sodium: 137 mEq/L (ref 137–147)

## 2013-03-05 LAB — CBC
HEMATOCRIT: 31.3 % — AB (ref 36.0–46.0)
HEMOGLOBIN: 9.9 g/dL — AB (ref 12.0–15.0)
MCH: 30.9 pg (ref 26.0–34.0)
MCHC: 31.6 g/dL (ref 30.0–36.0)
MCV: 97.8 fL (ref 78.0–100.0)
Platelets: 269 10*3/uL (ref 150–400)
RBC: 3.2 MIL/uL — ABNORMAL LOW (ref 3.87–5.11)
RDW: 17.4 % — ABNORMAL HIGH (ref 11.5–15.5)
WBC: 10.6 10*3/uL — AB (ref 4.0–10.5)

## 2013-03-05 LAB — MAGNESIUM
MAGNESIUM: 1.6 mg/dL (ref 1.5–2.5)
MAGNESIUM: 2.2 mg/dL (ref 1.5–2.5)
Magnesium: 2.1 mg/dL (ref 1.5–2.5)

## 2013-03-05 LAB — PHOSPHORUS
Phosphorus: 1.9 mg/dL — ABNORMAL LOW (ref 2.3–4.6)
Phosphorus: 3.3 mg/dL (ref 2.3–4.6)
Phosphorus: 3.7 mg/dL (ref 2.3–4.6)

## 2013-03-05 LAB — CLOSTRIDIUM DIFFICILE BY PCR: Toxigenic C. Difficile by PCR: NEGATIVE

## 2013-03-05 LAB — PROTIME-INR
INR: 1.12 (ref 0.00–1.49)
Prothrombin Time: 14.2 seconds (ref 11.6–15.2)

## 2013-03-05 LAB — APTT: aPTT: 26 seconds (ref 24–37)

## 2013-03-05 MED ORDER — POTASSIUM PHOSPHATE DIBASIC 3 MMOLE/ML IV SOLN
15.0000 mmol | Freq: Once | INTRAVENOUS | Status: AC
  Administered 2013-03-05: 15 mmol via INTRAVENOUS
  Filled 2013-03-05: qty 5

## 2013-03-05 MED ORDER — CARVEDILOL 6.25 MG PO TABS
18.7500 mg | ORAL_TABLET | Freq: Two times a day (BID) | ORAL | Status: DC
  Administered 2013-03-05 – 2013-03-06 (×3): 18.75 mg via ORAL
  Filled 2013-03-05 (×8): qty 1

## 2013-03-05 MED ORDER — CARVEDILOL 6.25 MG PO TABS
18.7500 mg | ORAL_TABLET | Freq: Two times a day (BID) | ORAL | Status: DC
  Filled 2013-03-05 (×2): qty 1

## 2013-03-05 MED ORDER — HYDROCORTISONE SOD SUCCINATE 100 MG IJ SOLR
25.0000 mg | Freq: Two times a day (BID) | INTRAMUSCULAR | Status: DC
Start: 2013-03-05 — End: 2013-03-07
  Administered 2013-03-05 – 2013-03-07 (×4): 25 mg via INTRAVENOUS
  Filled 2013-03-05 (×6): qty 0.5

## 2013-03-05 MED ORDER — MAGNESIUM SULFATE 40 MG/ML IJ SOLN
2.0000 g | Freq: Once | INTRAMUSCULAR | Status: AC
  Administered 2013-03-05: 2 g via INTRAVENOUS
  Filled 2013-03-05: qty 50

## 2013-03-05 NOTE — Progress Notes (Signed)
[facility] Progress Note   Subjective: No fluid off w HD yest due to low BP's. CXR was clear on 1/28, today possible early edema  Filed Vitals:   03/05/13 0500 03/05/13 0600 03/05/13 0700 03/05/13 0800  BP:      Pulse: 93 100 114 98  Temp:    98.6 F (37 C)  TempSrc:    Axillary  Resp: 16 15 13 8   Height:      Weight: 64.8 kg (142 lb 13.7 oz)     SpO2: 100% 100% 100% 100%  Exam Opens eyes and follows simple commands No jvd  Chest clear ant and lat  Irregular, displaced and sustained PMI, no gallop or M  Abd nondistended, no mass or ascites  L AKA stump clean and dry, RLE no ulcer or gangrene, sacral region no skin breakdown RUE AVG patent Neuro is sedated on vent  Dialysis: [facility] MWF  3.5 hrs 59kg  2K/2.25 Bath Heparin 2000 RUA AVG  EPO 4600   Venofer 50 each HD   Hect none  Assessment  1. Shock, suspected sepsis- unclear source, blood cx's neg, +diarrhea, Cdif pending 2. ESRD, HD tomorrow 3. Anemia of CKD, Hb 9.4, on darbe 100/wk, IV Fe 4. Dementia / Parkinson's disease / hx CVA / Debility- cared for by aides x 3 around the clock at home 5. L AKA Jan 2015 6. Chronic afib- on coreg, IV amio, no anticoagulation; would prefer amio to coreg given low BP's and volume excess 7. HTN/volume- BP's low, up 5-6kg by wts, attempt UF again w HD tomorrow 8. 2HPTH- low phos , holding binders; cont sensipar when eating 9. Hypernatremia- recheck today, on D5W qat 40/hr  Kelly Splinter MD pager [phone]    cell [phone] 03/05/2013, 9:33 AM   Recent Labs Lab 03/02/13 1645 03/03/13 0415 03/04/13 0255 03/04/13 0500 03/04/13 2345  NA 150* 148* 144  --   --   K 2.2* 4.1 4.2  --   --   CL 119* 112 108  --   --   CO2 12* 19 18*  --   --   GLUCOSE 175* 139* 163*  --   --   BUN 18 27* 30*  --   --   CREATININE 2.26* 3.61* 4.00*  --   --   CALCIUM 5.8* 9.5 9.5  --   --   PHOS 1.6* 1.6* 4.6 4.7* 1.9*    Recent Labs Lab 03/02/13 1340 03/02/13 1645  03/04/13 0255  AST 30 21 15   ALT <5 <5 <5  ALKPHOS 103 74 87  BILITOT 0.3 0.2* 0.2*  PROT 5.0* 3.5* 4.5*  ALBUMIN 1.7* 1.2* 1.6*    Recent Labs Lab 03/02/13 1340 03/02/13 1645 03/03/13 0415 03/04/13 0255 03/05/13 0310  WBC 11.1* 14.0* 11.9* 11.7* 10.6*  NEUTROABS 8.4* 11.1*  --   --   --   HGB 11.3* 10.7* 10.4* 9.4* 9.9*  HCT 36.4 34.1* 33.0* 29.6* 31.3*  MCV 100.3* 98.6 97.1 97.7 97.8  PLT 309 326 325 276 269   . antiseptic oral rinse  15 mL Mouth Rinse QID  . carvedilol  18.75 mg Oral BID WC  . cefTRIAXone (ROCEPHIN)  IV  1 g Intravenous Q24H  . chlorhexidine  15 mL Mouth Rinse BID  . feeding supplement (NEPRO CARB STEADY)  1,000 mL Per Tube Q24H  . feeding supplement (PRO-STAT SUGAR FREE 64)  30 mL Per Tube BID  . hydrocortisone sodium succinate  25 mg Intravenous Q12H  .  insulin aspart  2-6 Units Subcutaneous Q4H  . levothyroxine  25 mcg Per Tube QAC breakfast  . pantoprazole (PROTONIX) IV  40 mg Intravenous Q12H   . dextrose 40 mL/hr at 03/05/13 0800  . fentaNYL infusion INTRAVENOUS Stopped (03/04/13 1130)   sodium chloride, sodium chloride, acetaminophen (TYLENOL) oral liquid 160 mg/5 mL, feeding supplement (NEPRO CARB STEADY), fentaNYL, heparin, lidocaine (PF), lidocaine-prilocaine, pentafluoroprop-tetrafluoroeth, sodium chloride

## 2013-03-05 NOTE — Progress Notes (Signed)
Patient instructed to use Incentive Spirometer, however, patient didn't speak nor grasp the understanding of how to perform. Very poor effort. RN aware.

## 2013-03-05 NOTE — Procedures (Signed)
Extubation Procedure Note  Patient Details:   Name: Katrina Meyer DOB: 04-24-1932 MRN: [id number]   Airway Documentation:     Evaluation  O2 sats: stable throughout Complications: No apparent complications Patient did tolerate procedure well. Bilateral Breath Sounds: Diminished Suctioning: Airway Patient tolerated wean. MD ordered to extubate. Positive for cuff leak. Patient extubated to a 3 Lpm nasal cannula. No signs of dyspnea or stridor. Patient resting comfortably. Daughter at bedside. Will continue to monitor.  Myrtie Neither 03/05/2013, 9:58 AM

## 2013-03-05 NOTE — Progress Notes (Signed)
SLP Cancellation Note  Patient Details Name: GAGE TREIBER MRN: [id number] DOB: 1932-02-19   Cancelled treatment:        Received order for swallow assessment.  Pt. Extubated this a.m. at 0945.  Minimum is 4 hours prior to swallow assessment, however pt.would likely benefit from deferring to tomorrow am. for facilitation of pharyngeal healing post extubation.  Pt. Also has a large bore NGT (using for suctioning or feeding?) which poses partial obstruction of epiglottic deflection.  Pt. Would benefit from removal of large bore NGT prior to swallow assessment if able.   Orbie Pyo [place].Ed [employer] [phone]  03/05/2013

## 2013-03-05 NOTE — Progress Notes (Signed)
Name: Katrina Meyer MRN: [id number] DOB: 06-27-1932    ADMISSION DATE:  03/02/2013 CONSULTATION DATE:  03/02/2013  REFERRING MD :  Dr. Lynelle Doctor PRIMARY SERVICE: PCCM  BRIEF PATIENT DESCRIPTION: 80 year old female with PMH of ESRD- HD- MWF, parkinsons, A. Fib, HLD, HTN, dementia, who went to dialysis on the day PTP became hypotensive, lethargic and then unresponsive. Brought to the ED, Code sepsis was called and PCCM was asked to admit.  SIGNIFICANT EVENTS / STUDIES:  1/27 AMS, septic shock. 1/28- pressors improved 1/29 apnic on wean 1/30- increase stools  LINES / TUBES: ET tube 1/27>>> R IJ TLC 1/27>>>  CULTURES: Blood 1/27>>> Urine 1/27>>> No urine. Sputum 1/27>>> Final No growth. cdiff 1/30>>>  ANTIBIOTICS: Vancomycin 1/27>>>1/ 27 Zosyn 1/27>>>1/29 Ceftriazone 1/29 >>>  SUBJECTIVE: stool increased  VITAL SIGNS: Temp:  [97 F (36.1 C)-97.5 F (36.4 C)] 97.5 F (36.4 C) (01/30 0016) Pulse Rate:  [65-117] 100 (01/30 0600) Resp:  [9-31] 15 (01/30 0600) BP: (70-166)/(38-104) 105/39 mmHg (01/30 0322) SpO2:  [81 %-100 %] 100 % (01/30 0600) Arterial Line BP: (70-179)/(34-66) 105/47 mmHg (01/30 0200) FiO2 (%):  [30 %] 30 % (01/30 0600) Weight:  [142 lb 13.7 oz (64.8 kg)] 142 lb 13.7 oz (64.8 kg) (01/30 0500) HEMODYNAMICS: CVP:  [3 mmHg-10 mmHg] 7 mmHg VENTILATOR SETTINGS: Vent Mode:  [-] PRVC FiO2 (%):  [30 %] 30 % Set Rate:  [8 bmp] 8 bmp Vt Set:  [320 mL-350 mL] 320 mL PEEP:  [5 cmH20] 5 cmH20 Plateau Pressure:  [9 cmH20-12 cmH20] 10 cmH20 INTAKE / OUTPUT: Intake/Output     01/29 0701 - 01/30 0700 01/30 0701 - 01/31 0700   I.V. (mL/kg) 1439.1 (22.2)    NG/GT 199.2    IV Piggyback 305    Total Intake(mL/kg) 1943.3 (30)    Other -6    Total Output -6     Net +1949.3            PHYSICAL EXAMINATION: General: ETT in place, on PRVC, not in any distress Neuro:  Awake, follows some commands, eyes open HEENT:  [place]/AT.  Cardiovascular: irregular rate,  tachycardic, in A fib- per tele.  Lungs:  Diffuse upper airway sounds. Abdomen:  Soft, NT, ND and +BS. Musculoskeletal:   No pedal edema or tenderness.  L AKA. Skin:  Clean healing incision on with sutures on the L AKA.  LABS:  CBC  Recent Labs Lab 03/03/13 0415 03/04/13 0255 03/05/13 0310  WBC 11.9* 11.7* 10.6*  HGB 10.4* 9.4* 9.9*  HCT 33.0* 29.6* 31.3*  PLT 325 276 269   Coag's  Recent Labs Lab 03/03/13 1125 03/04/13 0255 03/05/13 0310  APTT 106* 41* 26  INR 5.09* 1.27 1.12   BMET  Recent Labs Lab 03/02/13 1645 03/03/13 0415 03/04/13 0255  NA 150* 148* 144  K 2.2* 4.1 4.2  CL 119* 112 108  CO2 12* 19 18*  BUN 18 27* 30*  CREATININE 2.26* 3.61* 4.00*  GLUCOSE 175* 139* 163*   Electrolytes  Recent Labs Lab 03/02/13 1645 03/03/13 0415 03/04/13 0255 03/04/13 0500 03/04/13 2345  CALCIUM 5.8* 9.5 9.5  --   --   MG 1.1* 1.7  --  1.8 1.6  PHOS 1.6* 1.6* 4.6 4.7* 1.9*   Sepsis Markers  Recent Labs Lab 03/02/13 1315 03/02/13 1645  LATICACIDVEN 7.93* 2.0   ABG  Recent Labs Lab 03/03/13 2100 03/04/13 1235 03/04/13 1740  PHART 7.507* 7.353 7.531*  PCO2ART 25.3* 36.1 33.1*  PO2ART  200.0* 162.0* 150.0*   Liver Enzymes  Recent Labs Lab 03/02/13 1340 03/02/13 1645 03/04/13 0255  AST 30 21 15   ALT <5 <5 <5  ALKPHOS 103 74 87  BILITOT 0.3 0.2* 0.2*  ALBUMIN 1.7* 1.2* 1.6*   Cardiac Enzymes  Recent Labs Lab 03/02/13 1645  PROBNP 13840.0*   Glucose  Recent Labs Lab 03/04/13 0749 03/04/13 1149 03/04/13 1552 03/04/13 1947 03/04/13 2338 03/05/13 0308  GLUCAP 122* 141* 111* 109* 147* 144*    Imaging Dg Chest Port 1 View  03/04/2013   CLINICAL DATA:  Respiratory difficulty  EXAM: PORTABLE CHEST - 1 VIEW  COMPARISON:  Yesterday  FINDINGS: Stable endotracheal tube. Lungs clear. Upper normal heart size. No pneumothorax.  IMPRESSION: Stable endotracheal tube with clear lungs.   Electronically Signed   By: Maryclare Bean M.D.   On:  03/04/2013 07:59   Dg Abd Portable 1v  03/04/2013   CLINICAL DATA:  OG tube placement  EXAM: PORTABLE ABDOMEN - 1 VIEW  COMPARISON:  05/30/2012  FINDINGS: An OG tube is noted, side port projecting over the distal esophagus and tube tip at the GE junction. A right sided femoral approach catheter projects just medial to the calcified right external iliac artery and presumably within the external iliac vein. Bases clear. Bowel gas pattern nonspecific without overt obstruction. Rounded calcifications over the lower pelvis presumably reflect fibroids. Advanced atherosclerotic vascular calcifications. Osteopenia.  IMPRESSION: OG tube side port in the distal esophagus and tube tip at the GE junction. Recommend advancement by at least 10 cm.   Electronically Signed   By: Carlos Levering M.D.   On: 03/04/2013 21:02    CXR 1/30 : ETT in place, no acute abnormality or feats of PNA, calcified aorta, improved interstitial markings- Edema compared to yesterday.  ASSESSMENT / PLAN:  PULMONARY A: Acute respiratory failure due to inability to protect airway. Respiratory alkalosis- On vent, rate 8, VT reduced to 320 P:   - Wean trial today appears well, cpap 5 ps5 good volumes -pcxr in am, improved after HD - Apnea alarms noted -abg on wean  CARDIOVASCULAR A: Septic shock- Off pressors  Afib (recent GI bleed)- not on anticoag Mets criteria rel AI P:  - See ID - Cont Amiodarone infusion - Off pressors - Cont steroids 25 q6h and taper further to q12h -consider addition home coreg and dc amio as BP improved and amio not home med  RENAL A:  ESRD, HD done 1/29 Hypophosphatemia- reoccuring after prevuious replacement. Now- 1.9 today- 1/30 P:   - follow Bmets. - Nephrology following. - Give- 10 of phosph, recheck in the am -improved pcxr s/p HD  GASTROINTESTINAL A:  Hx of Gi bleed- Duodenitis/gastritis EGD- 02/14/2013 Diarrhea- 3 episodes since yesterday P:   - TF held for now, on wean. - BID  protonix for 2 months. - Rectal Tube, to prev infection of sacral decub ulcer present. -send cdiff  HEMATOLOGIC A:  Leukocytosis- resolving Elevated PT-INR, not on Anticoag- Now normalized error? Highly likely P: , - Scds   INFECTIOUS A: septic shock source unclear, decub unimpressive, r/o bacteremia on HD, stools increased P:   - On ceftriaxone, add stop date totoal 7 days  - Blood cultures pending. Tracheal asp- neg. -send cdiff  ENDOCRINE A:  DM by history, rel AI, hypothyroidism P:   - ICU hyperglycemia protocol- CBGs- 122-144. - Continue steroids but reduce - TSH normal, can restart home synthroid  NEUROLOGIC A:  AMS likely related to sepsis-  Head Ct neg for Acute infrarct or hemmorrhage. - H/o dementia - H/o Parkinsons Concerned her fxnal status poor, NOT confirmed by daughter P:   - Currently off Fentanyl for wean - upright - avoid benzo -start parkns meds when abel to take oral, cant crush  Bing Neighbors, MD, PGY-1. 03/05/2013 8:11 AM  I have fully examined this patient and agree with above findings.    And edited i nfull  Ccm time 30 in   Lavon Paganini. Titus Mould, MD, [place] Pgr: [facility]

## 2013-03-06 ENCOUNTER — Inpatient Hospital Stay (HOSPITAL_COMMUNITY): Payer: Medicare Other

## 2013-03-06 LAB — GLUCOSE, CAPILLARY
GLUCOSE-CAPILLARY: 138 mg/dL — AB (ref 70–99)
GLUCOSE-CAPILLARY: 140 mg/dL — AB (ref 70–99)
Glucose-Capillary: 104 mg/dL — ABNORMAL HIGH (ref 70–99)
Glucose-Capillary: 103 mg/dL — ABNORMAL HIGH (ref 70–99)
Glucose-Capillary: 88 mg/dL (ref 70–99)

## 2013-03-06 LAB — CBC
HCT: 29.9 % — ABNORMAL LOW (ref 36.0–46.0)
HEMOGLOBIN: 9.6 g/dL — AB (ref 12.0–15.0)
MCH: 31.2 pg (ref 26.0–34.0)
MCHC: 32.1 g/dL (ref 30.0–36.0)
MCV: 97.1 fL (ref 78.0–100.0)
Platelets: 257 10*3/uL (ref 150–400)
RBC: 3.08 MIL/uL — ABNORMAL LOW (ref 3.87–5.11)
RDW: 17.6 % — ABNORMAL HIGH (ref 11.5–15.5)
WBC: 8.9 10*3/uL (ref 4.0–10.5)

## 2013-03-06 LAB — BASIC METABOLIC PANEL
BUN: 17 mg/dL (ref 6–23)
CALCIUM: 8.4 mg/dL (ref 8.4–10.5)
CO2: 26 mEq/L (ref 19–32)
Chloride: 97 mEq/L (ref 96–112)
Creatinine, Ser: 2.77 mg/dL — ABNORMAL HIGH (ref 0.50–1.10)
GFR, EST AFRICAN AMERICAN: 18 mL/min — AB (ref >90)
GFR, EST NON AFRICAN AMERICAN: 15 mL/min — AB (ref >90)
Glucose, Bld: 95 mg/dL (ref 70–99)
POTASSIUM: 3.1 meq/L — AB (ref 3.7–5.3)
SODIUM: 139 meq/L (ref 137–147)

## 2013-03-06 MED ORDER — ALBUMIN HUMAN 25 % IV SOLN
INTRAVENOUS | Status: AC
  Administered 2013-03-06: 25 g
  Filled 2013-03-06: qty 100

## 2013-03-06 MED ORDER — DARBEPOETIN ALFA-POLYSORBATE 40 MCG/0.4ML IJ SOLN
40.0000 ug | Freq: Once | INTRAMUSCULAR | Status: AC
  Administered 2013-03-06: 40 ug via INTRAVENOUS
  Filled 2013-03-06: qty 0.4

## 2013-03-06 MED ORDER — HEPARIN SODIUM (PORCINE) 1000 UNIT/ML IJ SOLN
2000.0000 [IU] | Freq: Once | INTRAMUSCULAR | Status: AC
  Administered 2013-03-06: 2000 [IU] via INTRAVENOUS

## 2013-03-06 MED ORDER — DARBEPOETIN ALFA-POLYSORBATE 40 MCG/0.4ML IJ SOLN
40.0000 ug | INTRAMUSCULAR | Status: DC
  Administered 2013-03-08: 40 ug via INTRAVENOUS

## 2013-03-06 MED ORDER — LIDOCAINE-PRILOCAINE 2.5-2.5 % EX CREA: 1.0000 "application " | TOPICAL_CREAM | CUTANEOUS | Status: DC | PRN

## 2013-03-06 MED ORDER — ALTEPLASE 2 MG IJ SOLR
2.0000 mg | Freq: Once | INTRAMUSCULAR | Status: AC | PRN
  Filled 2013-03-06: qty 2

## 2013-03-06 MED ORDER — SODIUM CHLORIDE 0.9 % IV SOLN: 100.0000 mL | INTRAVENOUS | Status: DC | PRN

## 2013-03-06 MED ORDER — HEPARIN SODIUM (PORCINE) 1000 UNIT/ML DIALYSIS: 2000.0000 [IU] | Freq: Once | INTRAMUSCULAR | Status: DC

## 2013-03-06 MED ORDER — HEPARIN SODIUM (PORCINE) 1000 UNIT/ML DIALYSIS: 1000.0000 [IU] | INTRAMUSCULAR | Status: DC | PRN

## 2013-03-06 MED ORDER — LIDOCAINE HCL (PF) 1 % IJ SOLN: 5.0000 mL | INTRAMUSCULAR | Status: DC | PRN

## 2013-03-06 MED ORDER — NEPRO/CARBSTEADY PO LIQD: 237.0000 mL | ORAL | Status: DC | PRN

## 2013-03-06 MED ORDER — PENTAFLUOROPROP-TETRAFLUOROETH EX AERO: 1.0000 "application " | INHALATION_SPRAY | CUTANEOUS | Status: DC | PRN

## 2013-03-06 MED ORDER — ALBUMIN HUMAN 25 % IV SOLN: 25.0000 g | Freq: Once | INTRAVENOUS | Status: DC

## 2013-03-06 NOTE — Evaluation (Signed)
Clinical/Bedside Swallow Evaluation Patient Details  Name: Katrina Meyer MRN: [id number] Date of Birth: 12-01-1932  Today's Date: 03/06/2013 Time: 0830-0900 SLP Time Calculation (min): 30 min  Past Medical History:  Past Medical History  Diagnosis Date  . Hyperlipidemia     takes Lipitor daily  . DVT (deep venous thrombosis)   . Stroke   . Parkinson's disease     takes Sinemet daily  . Tibia/fibula fracture 01/04/2012    Treated with long leg casting. Cast change in hospital 02/27/12   . Peripheral vascular disease 02/27/2012    Skin ulcer of right great toe  . Atrial fibrillation 03/09/2012    Seen on older EKG as well but family not aware.   . Macrocytic anemia 03/09/2012  . Lower GI bleed 03/10/2012    hematochezia secondary to ulcerated polyps; diverticulosis; chronic diarrhea  . End stage renal disease on dialysis     [facility]- MWF  . Hypertension     takes Coreg daily  . Dementia     takes Aricept daily  . Diabetes mellitus, type II     takes Lantus and Novolog  . Seasonal allergies     takes Allegra daily  . Depression     takes Prozac daily  . Hypothyroidism     takes Synthroid daily   Past Surgical History:  Past Surgical History  Procedure Laterality Date  . Dg av dialysis graft declot or    . Av fistula repair  Aug. 2013    Left arm  . Cholecystectomy    . Colon surgery      Aprrox 2006 at [facility] after she had a colonoscopy  . Colonoscopy  03/10/2012    Procedure: COLONOSCOPY;  Surgeon: Rogene Houston, MD;  [facility];  Service: Endoscopy;  Laterality: N/A;  . Esophagogastroduodenoscopy  03/10/2012    Procedure: ESOPHAGOGASTRODUODENOSCOPY (EGD);  Surgeon: Rogene Houston, MD;  [facility]: [facility];  Service: Endoscopy;  Laterality: N/A;  EGD if TCS normal.  . Ligation of arteriovenous  fistula Left 03/24/2012    Procedure: LIGATION OF ARTERIOVENOUS  FISTULA;  Surgeon: Conrad [place], MD;  Location: [place];  Service: Vascular;  Laterality:  Left;  . Av fistula placement Right 04/28/2012    Procedure: INSERTION OF ARTERIOVENOUS (AV) GORE-TEX GRAFT ARM;  Surgeon: Elam Dutch, MD;  Location: [facility];  Service: Vascular;  Laterality: Right;  . Leg amputation above knee Left   . Angioplast of lle  12/2012-01/2013    Several Procedures  . Esophagogastroduodenoscopy N/A 02/14/2013    Procedure: ESOPHAGOGASTRODUODENOSCOPY (EGD);  Surgeon: Milus Banister, MD;  Location: [place];  Service: Endoscopy;  Laterality: N/A;  . Cardiac catheterization  03/08/2004    Normal coronary arteries and normal LV function. Normal aortoiliac anatomy suitable for renal transplant.  . Transthoracic echocardiogram  02/07/2004    EF 60%, no evidence of significant valvular abnormalities.   HPI:  80 year old female with PMH of ESRD- HD- MWF, parkinsons, A. Fib, HLD, HTN, dementia,  Gi bleed- Duodenitis/gastritis EGD- 02/14/2013 who went to dialysis on the day PTP became hypotensive, lethargic and then unresponsive. Diagnosed with septic shock of unknown origin. Intubated 1/27-1/30. Admitting CXR and head CT negative for acute changes.    Assessment / Plan / Recommendation Clinical Impression  Patient presents with overt indication of decreased airway protection based on bedside exam. Suspect a multi-factorial acute reversible dysphagia due to exacerbated dementia/AMS and prolonged intubation.  Oral delays noted,  exacerbated from baseline per caregiver, requiring moderate SLP cueing for timely oral transit and initiation of swallow. S/s of aspiration characterized by coughing post swallow inconsistent with thin and thickened liquids as well as pureed solids. Vocal quality remaining clear.  Recommend proceeding with objective testing to determine potential to initiate a po diet.     Aspiration Risk  Moderate    Diet Recommendation NPO   Medication Administration: Via alternative means    Other  Recommendations Recommended Consults: MBS Oral Care  Recommendations: Oral care BID   Follow Up Recommendations   (TBD)    Frequency and Duration        Pertinent Vitals/Pain n/a        Swallow Study    General HPI: 80 year old female with PMH of ESRD- HD- MWF, parkinsons, A. Fib, HLD, HTN, dementia,  Gi bleed- Duodenitis/gastritis EGD- 02/14/2013 who went to dialysis on the day PTP became hypotensive, lethargic and then unresponsive. Diagnosed with septic shock of unknown origin. Intubated 1/27-1/30. Admitting CXR and head CT negative for acute changes.  Type of Study: Bedside swallow evaluation Previous Swallow Assessment: n/a Diet Prior to this Study: NPO (NG tube, removed during exam) Temperature Spikes Noted: No Respiratory Status: Nasal cannula History of Recent Intubation: Yes Length of Intubations (days): 3 days Date extubated: 03/05/13 Behavior/Cognition: Alert;Requires cueing;Decreased sustained attention;Confused Oral Cavity - Dentition: Poor condition;Missing dentition Self-Feeding Abilities: Needs assist Patient Positioning: Upright in bed Baseline Vocal Quality: Clear Volitional Cough: Cognitively unable to elicit Volitional Swallow: Able to elicit    Oral/Motor/Sensory Function Overall Oral Motor/Sensory Function: Appears within functional limits for tasks assessed (although unable to formally test due to AMS)   Ice Chips Ice chips: Impaired Presentation: Spoon Oral Phase Impairments: Impaired anterior to posterior transit   Thin Liquid Thin Liquid: Impaired Presentation: Cup;Spoon;Straw Oral Phase Functional Implications: Prolonged oral transit Pharyngeal  Phase Impairments: Suspected delayed Swallow;Cough - Immediate;Cough - Delayed    Nectar Thick Nectar Thick Liquid: Impaired Presentation: Spoon;Cup Oral phase functional implications: Prolonged oral transit Pharyngeal Phase Impairments: Suspected delayed Swallow;Cough - Delayed   Honey Thick Honey Thick Liquid: Not tested   Puree Puree:  Impaired Presentation: Spoon Oral Phase Impairments: Impaired anterior to posterior transit Oral Phase Functional Implications: Prolonged oral transit;Oral holding Pharyngeal Phase Impairments: Cough - Delayed   Solid   GO   Katrina Haser MA, CCC-SLP [phone]  Solid: Not tested       Parminder Cupples Meryl 03/06/2013,9:06 AM

## 2013-03-06 NOTE — Procedures (Signed)
I was present at this dialysis session, have reviewed the session itself and made  appropriate changes  Kelly Splinter MD (pgr) [phone]    ([phone] 03/06/2013, 1:53 PM

## 2013-03-06 NOTE — Procedures (Signed)
Objective Swallowing Evaluation: Modified Barium Swallowing Study  Patient Details  Name: Katrina Meyer MRN: [id number] Date of Birth: [birth year]  Today's Date: 03/06/2013 Time: 1200-1220 SLP Time Calculation (min): 20 min  Past Medical History:  Past Medical History  Diagnosis Date  . Hyperlipidemia     takes Lipitor daily  . DVT (deep venous thrombosis)   . Stroke   . Parkinson's disease     takes Sinemet daily  . Tibia/fibula fracture 01/04/2012    Treated with long leg casting. Cast change in hospital 02/27/12   . Peripheral vascular disease 02/27/2012    Skin ulcer of right great toe  . Atrial fibrillation 03/09/2012    Seen on older EKG as well but family not aware.   . Macrocytic anemia 03/09/2012  . Lower GI bleed 03/10/2012    hematochezia secondary to ulcerated polyps; diverticulosis; chronic diarrhea  . End stage renal disease on dialysis     [facility]- MWF  . Hypertension     takes Coreg daily  . Dementia     takes Aricept daily  . Diabetes mellitus, type II     takes Lantus and Novolog  . Seasonal allergies     takes Allegra daily  . Depression     takes Prozac daily  . Hypothyroidism     takes Synthroid daily   Past Surgical History:  Past Surgical History  Procedure Laterality Date  . Dg av dialysis graft declot or    . Av fistula repair  Aug. 2013    Left arm  . Cholecystectomy    . Colon surgery      Aprrox 2006 at [facility] after she had a colonoscopy  . Colonoscopy  03/10/2012    Procedure: COLONOSCOPY;  Surgeon: Rogene Houston, MD;  [facility];  Service: Endoscopy;  Laterality: N/A;  . Esophagogastroduodenoscopy  03/10/2012    Procedure: ESOPHAGOGASTRODUODENOSCOPY (EGD);  Surgeon: Rogene Houston, MD;  [facility];  Service: Endoscopy;  Laterality: N/A;  EGD if TCS normal.  . Ligation of arteriovenous  fistula Left 03/24/2012    Procedure: LIGATION OF ARTERIOVENOUS  FISTULA;  Surgeon: Conrad Vance, MD;  Location: [place];   Service: Vascular;  Laterality: Left;  . Av fistula placement Right 04/28/2012    Procedure: INSERTION OF ARTERIOVENOUS (AV) GORE-TEX GRAFT ARM;  Surgeon: Elam Dutch, MD;  [facility];  Service: Vascular;  Laterality: Right;  . Leg amputation above knee Left   . Angioplast of lle  12/2012-01/2013    Several Procedures  . Esophagogastroduodenoscopy N/A 02/14/2013    Procedure: ESOPHAGOGASTRODUODENOSCOPY (EGD);  Surgeon: Milus Banister, MD;  Location: [place];  Service: Endoscopy;  Laterality: N/A;  . Cardiac catheterization  03/08/2004    Normal coronary arteries and normal LV function. Normal aortoiliac anatomy suitable for renal transplant.  . Transthoracic echocardiogram  02/07/2004    EF 60%, no evidence of significant valvular abnormalities.   HPI:  80 year old female with PMH of ESRD- HD- MWF, parkinsons, A. Fib, HLD, HTN, dementia,  Gi bleed- Duodenitis/gastritis EGD- 02/14/2013 who went to dialysis on the day PTP became hypotensive, lethargic and then unresponsive. Diagnosed with septic shock of unknown origin. Intubated 1/27-1/30. Admitting CXR and head CT negative for acute changes.      Assessment / Plan / Recommendation Clinical Impression  Dysphagia Diagnosis: Moderate oral phase dysphagia;Mild pharyngeal phase dysphagia Clinical impression: MBS complete. Patient presents with a moderate oral and a mild pharyngeal  dysphagia with an exacerbated aspiration risk based on AMS/exacerbated dementia. Oral phase characterized by oral holding requiring max verbal, visual, and tactile cueing to facilitate oral transit. Combination of delayed swallow initiation, mild oral residuals, and mild vallecular residuals post swallows result in trace but silent penetration of all liquids consistencies tested with eventual silent aspiration. Patient unable to follow clinician commands for hard cough in attempts to clear. Aspiration risk high. Prognosis for improvement good with improved  mentation.     Treatment Recommendation  Therapy as outlined in treatment plan below    Diet Recommendation NPO;Alternative means - temporary   Medication Administration: Via alternative means    Other  Recommendations Oral Care Recommendations: Oral care Q4 per protocol   Follow Up Recommendations       Frequency and Duration min 2x/week  2 weeks           General HPI: 80 year old female with PMH of ESRD- HD- MWF, parkinsons, A. Fib, HLD, HTN, dementia,  Gi bleed- Duodenitis/gastritis EGD- 02/14/2013 who went to dialysis on the day PTP became hypotensive, lethargic and then unresponsive. Diagnosed with septic shock of unknown origin. Intubated 1/27-1/30. Admitting CXR and head CT negative for acute changes.  Type of Study: Modified Barium Swallowing Study Reason for Referral: Objectively evaluate swallowing function Previous Swallow Assessment: n/a-prior to admission Diet Prior to this Study: NPO Temperature Spikes Noted: No Respiratory Status: Nasal cannula History of Recent Intubation: Yes Length of Intubations (days): 3 days Date extubated: 03/05/13 Behavior/Cognition: Alert;Requires cueing;Decreased sustained attention;Confused Oral Cavity - Dentition: Poor condition;Missing dentition Oral Motor / Sensory Function: Within functional limits (although unable to formally test due to AMS) Self-Feeding Abilities: Needs assist Patient Positioning: Upright in chair Baseline Vocal Quality: Clear Volitional Cough: Cognitively unable to elicit Volitional Swallow: Able to elicit Anatomy: Within functional limits Pharyngeal Secretions: Not observed secondary MBS    Reason for Referral Objectively evaluate swallowing function   Oral Phase Oral Preparation/Oral Phase Oral Phase: Impaired Oral - Honey Oral - Honey Teaspoon: Reduced posterior propulsion;Holding of bolus;Lingual/palatal residue;Delayed oral transit Oral - Nectar Oral - Nectar Cup: Reduced posterior  propulsion;Holding of bolus;Lingual/palatal residue;Delayed oral transit Oral - Thin Oral - Thin Cup: Reduced posterior propulsion;Holding of bolus;Lingual/palatal residue;Delayed oral transit Oral - Solids Oral - Puree: Reduced posterior propulsion;Holding of bolus;Lingual/palatal residue;Delayed oral transit   Pharyngeal Phase Pharyngeal Phase Pharyngeal Phase: Impaired Pharyngeal - Honey Pharyngeal - Honey Teaspoon: Delayed swallow initiation;Premature spillage to valleculae;Pharyngeal residue - valleculae;Pharyngeal residue - pyriform sinuses;Penetration/Aspiration after swallow;Trace aspiration Penetration/Aspiration details (honey teaspoon): Material enters airway, passes BELOW cords without attempt by patient to eject out (silent aspiration);Material enters airway, CONTACTS cords and not ejected out Pharyngeal - Nectar Pharyngeal - Nectar Cup: Delayed swallow initiation;Premature spillage to valleculae;Pharyngeal residue - valleculae;Pharyngeal residue - pyriform sinuses;Penetration/Aspiration during swallow;Penetration/Aspiration after swallow;Trace aspiration Penetration/Aspiration details (nectar cup): Material enters airway, passes BELOW cords without attempt by patient to eject out (silent aspiration);Material enters airway, CONTACTS cords and not ejected out Pharyngeal - Thin Pharyngeal - Thin Cup: Delayed swallow initiation;Pharyngeal residue - valleculae;Pharyngeal residue - pyriform sinuses;Penetration/Aspiration during swallow;Penetration/Aspiration after swallow;Trace aspiration;Premature spillage to pyriform sinuses Penetration/Aspiration details (thin cup): Material enters airway, passes BELOW cords without attempt by patient to eject out (silent aspiration);Material enters airway, CONTACTS cords and not ejected out Pharyngeal - Solids Pharyngeal - Puree: Delayed swallow initiation;Premature spillage to valleculae;Pharyngeal residue - valleculae;Pharyngeal residue - pyriform  sinuses;Penetration/Aspiration after swallow Penetration/Aspiration details (puree): Material enters airway, remains ABOVE vocal cords then ejected out  Cervical Esophageal Phase    GO  Gabriel Rainwater MA, CCC-SLP [phone]   Cervical Esophageal Phase Cervical Esophageal Phase: [facility]         [name] Meryl 03/06/2013, 12:38 PM

## 2013-03-06 NOTE — Progress Notes (Signed)
PT Cancellation Note  Patient Details Name: Katrina Meyer MRN: [id number] DOB: 1932/09/10   Cancelled Treatment:    Reason Eval/Treat Not Completed: Patient at procedure or test/unavailable.  Patient just left room for HD.  Will return tomorrow for PT evaluation.   Despina Pole 03/06/2013, 2:17 PM Carita Pian. Sanjuana Kava, [place] Pager [phone]

## 2013-03-06 NOTE — Progress Notes (Signed)
[facility] Progress Note   Subjective: extubated, awake no complaints  Filed Vitals:   03/06/13 0400 03/06/13 0500 03/06/13 0600 03/06/13 0732  BP: 156/84 139/84 149/87   Pulse: 114 98 95   Temp:    97.9 F (36.6 C)  TempSrc:    Oral  Resp: 22 13 7    Height:      Weight: 63.4 kg (139 lb 12.4 oz)     SpO2: 100% 100% 100%   Exam Alert, no verbal response today, tries to follow commands No jvd  Chest clear, poor effort Irregular, displaced and sustained PMI, no gallop or M  Abd nondistended, no mass or ascites  L AKA stump clean and dry, RLE no ulcer or gangrene, R 3rd finger darkened RUE AVG patent  Dialysis: [place] MWF  3.5 hrs 59kg  2K/2.25 [place] Heparin 2000 RUA AVG  EPO 4600   Venofer 50 each HD   Hect none  Assessment: 1 Septic shock 2 Resp failure, off vent now 3 ESRD on HD  4 Anemia, Hb 9's 5 Debility / dementia, 24/7 aide care at home 6 L AKA at [facility] Jan 2015 7 UGIB Jan 2015 8 Afib, on coreg 9 Vol excess, up 4 kg today 10 2HPTH, on sensipar 11 Hypophosphatemia, better 12 Hypernatremia, resolved  Plan- d/c D5W, start esa,  HD today max UF as tol then again Mon to get back on schedule  Kelly Splinter MD pager [phone]    cell [phone] 03/06/2013, 9:34 AM   Recent Labs Lab 03/04/13 0255  03/04/13 2345 03/05/13 0900 03/05/13 0935 03/05/13 2250 03/06/13 0400  NA 144  --   --  137  --   --  139  K 4.2  --   --  3.1*  --   --  3.1*  CL 108  --   --  96  --   --  97  CO2 18*  --   --  23  --   --  26  GLUCOSE 163*  --   --  220*  --   --  95  BUN 30*  --   --  14  --   --  17  CREATININE 4.00*  --   --  2.11*  --   --  2.77*  CALCIUM 9.5  --   --  8.2*  --   --  8.4  PHOS 4.6  < > 1.9*  --  3.3 3.7  --   < > = values in this interval not displayed.  Recent Labs Lab 03/02/13 1340 03/02/13 1645 03/04/13 0255  AST 30 21 15   ALT <5 <5 <5  ALKPHOS 103 74 87  BILITOT 0.3 0.2* 0.2*  PROT 5.0* 3.5* 4.5*  ALBUMIN 1.7*  1.2* 1.6*    Recent Labs Lab 03/02/13 1340 03/02/13 1645  03/04/13 0255 03/05/13 0310 03/06/13 0400  WBC 11.1* 14.0*  < > 11.7* 10.6* 8.9  NEUTROABS 8.4* 11.1*  --   --   --   --   HGB 11.3* 10.7*  < > 9.4* 9.9* 9.6*  HCT 36.4 34.1*  < > 29.6* 31.3* 29.9*  MCV 100.3* 98.6  < > 97.7 97.8 97.1  PLT 309 326  < > 276 269 257  < > = values in this interval not displayed. Marland Kitchen antiseptic oral rinse  15 mL Mouth Rinse QID  . carvedilol  18.75 mg Oral BID WC  . cefTRIAXone (ROCEPHIN)  IV  1 g  Intravenous Q24H  . chlorhexidine  15 mL Mouth Rinse BID  . feeding supplement (NEPRO CARB STEADY)  1,000 mL Per Tube Q24H  . feeding supplement (PRO-STAT SUGAR FREE 64)  30 mL Per Tube BID  . hydrocortisone sodium succinate  25 mg Intravenous Q12H  . insulin aspart  2-6 Units Subcutaneous Q4H  . levothyroxine  25 mcg Per Tube QAC breakfast  . pantoprazole (PROTONIX) IV  40 mg Intravenous Q12H   . dextrose 40 mL/hr at 03/05/13 1600  . fentaNYL infusion INTRAVENOUS Stopped (03/04/13 1130)   sodium chloride, sodium chloride, acetaminophen (TYLENOL) oral liquid 160 mg/5 mL, feeding supplement (NEPRO CARB STEADY), fentaNYL, heparin, lidocaine (PF), lidocaine-prilocaine, pentafluoroprop-tetrafluoroeth, sodium chloride

## 2013-03-06 NOTE — Progress Notes (Signed)
Name: Katrina Meyer MRN: [id number] DOB: 1932-05-11    ADMISSION DATE:  03/02/2013 CONSULTATION DATE:  03/02/2013  REFERRING MD :  Dr. Lynelle Doctor PRIMARY SERVICE: PCCM  BRIEF PATIENT DESCRIPTION: 80 year old female with PMH of ESRD- HD- MWF, parkinsons, A. Fib, HLD, HTN, dementia, who went to dialysis on the day PTP became hypotensive, lethargic and then unresponsive. Brought to the ED, Code sepsis was called and PCCM was asked to admit.  SIGNIFICANT EVENTS / STUDIES:  1/27 AMS, septic shock. 1/28- pressors improved 1/29 apnea  on wean 1/30- increased stools 1/30 extubated LINES / TUBES: ET tube 1/27>>>1/30 R IJ TLC 1/27>>>  CULTURES: Blood 1/27>>> Urine 1/27>>> No urine. Sputum 1/27>>> Final No growth. cdiff 1/30>>>neg  ANTIBIOTICS: Vancomycin 1/27>>>1/ 27 Zosyn 1/27>>>1/29 Ceftriazone 1/29 >>>  SUBJECTIVE:  Extubated bur not very verbal  VITAL SIGNS: Temp:  [97.7 F (36.5 C)-98.2 F (36.8 C)] 97.9 F (36.6 C) (01/31 0732) Pulse Rate:  [74-118] 86 (01/31 1100) Resp:  [0-22] 11 (01/31 1100) BP: (124-161)/(60-116) 145/95 mmHg (01/31 1100) SpO2:  [97 %-100 %] 100 % (01/31 1100) Arterial Line BP: (118-154)/(53-70) 133/61 mmHg (01/30 1500) Weight:  [133 lb 6.1 oz (60.5 kg)-139 lb 12.4 oz (63.4 kg)] 139 lb 12.4 oz (63.4 kg) (01/31 0400) HEMODYNAMICS:   VENTILATOR SETTINGS:   INTAKE / OUTPUT: Intake/Output     01/30 0701 - 01/31 0700 01/31 0701 - 02/01 0700   I.V. (mL/kg) 1053.4 (16.6)    Other 40    NG/GT 25    IV Piggyback 50    Total Intake(mL/kg) 1168.4 (18.4)    Other     Stool 100    Total Output 100     Net +1068.4            PHYSICAL EXAMINATION: General: Only grunts to questions Neuro:  Awake, follows some commands, eyes open HEENT:  [place]/AT.  Cardiovascular: irregular rate, tachycardic, in A fib- per tele.  Lungs:  Diffuse upper airway sounds. Abdomen:  Soft, NT, ND and +BS. Musculoskeletal:   No pedal edema or tenderness.  L AKA. Skin:  Clean  healing incision on with sutures on the L AKA. Rt 2 nd metacarpal with vascular ulcer  LABS:  CBC  Recent Labs Lab 03/04/13 0255 03/05/13 0310 03/06/13 0400  WBC 11.7* 10.6* 8.9  HGB 9.4* 9.9* 9.6*  HCT 29.6* 31.3* 29.9*  PLT 276 269 257   Coag's  Recent Labs Lab 03/03/13 1125 03/04/13 0255 03/05/13 0310  APTT 106* 41* 26  INR 5.09* 1.27 1.12   BMET  Recent Labs Lab 03/04/13 0255 03/05/13 0900 03/06/13 0400  NA 144 137 139  K 4.2 3.1* 3.1*  CL 108 96 97  CO2 18* 23 26  BUN 30* 14 17  CREATININE 4.00* 2.11* 2.77*  GLUCOSE 163* 220* 95   Electrolytes  Recent Labs Lab 03/04/13 0255  03/04/13 2345 03/05/13 0900 03/05/13 0935 03/05/13 2250 03/06/13 0400  CALCIUM 9.5  --   --  8.2*  --   --  8.4  MG  --   < > 1.6  --  2.2 2.1  --   PHOS 4.6  < > 1.9*  --  3.3 3.7  --   < > = values in this interval not displayed. Sepsis Markers  Recent Labs Lab 03/02/13 1315 03/02/13 1645  LATICACIDVEN 7.93* 2.0   ABG  Recent Labs Lab 03/04/13 1235 03/04/13 1740 03/05/13 0925  PHART 7.353 7.531* 7.524*  PCO2ART 36.1 33.1*  35.7  PO2ART 162.0* 150.0* 148.0*   Liver Enzymes  Recent Labs Lab 03/02/13 1340 03/02/13 1645 03/04/13 0255  AST 30 21 15   ALT <5 <5 <5  ALKPHOS 103 74 87  BILITOT 0.3 0.2* 0.2*  ALBUMIN 1.7* 1.2* 1.6*   Cardiac Enzymes  Recent Labs Lab 03/02/13 1645  PROBNP 13840.0*   Glucose  Recent Labs Lab 03/05/13 1134 03/05/13 1540 03/05/13 1924 03/06/13 0021 03/06/13 0341 03/06/13 0720  GLUCAP 163* 122* 126* 138* 104* 103*    Imaging Dg Chest Port 1 View  03/06/2013   CLINICAL DATA:  Patient extubated.  EXAM: PORTABLE CHEST - 1 VIEW  COMPARISON:  03/05/2013  FINDINGS: Endotracheal tube has been removed. Nasogastric tube is stable passing below the diaphragm into the stomach.  Cardiac silhouette is mildly enlarged.  The aorta is uncoiled.  Prominent bronchovascular markings are stable. There is left basilar opacity that is  likely atelectasis. A small effusion is suspected. No convincing infiltrate or overt edema. No pneumothorax.  IMPRESSION: Status post extubation.  No other change from the prior study.   Electronically Signed   By: Lajean Manes M.D.   On: 03/06/2013 07:42   Dg Chest Port 1 View  03/05/2013   CLINICAL DATA:  And endotracheal tube positioning. The patient is on a ventilator.  EXAM: PORTABLE CHEST - 1 VIEW  COMPARISON:  One-view chest 03/04/2013.  FINDINGS: Cardiac enlargement is stable. The endotracheal tube terminates 3.5 cm above the carina. Extensive atherosclerotic calcifications are again seen within the descending aorta. Mild basilar airspace disease is slightly increased, left greater than right. The upper lung fields are clear.  IMPRESSION: 1. Stable endotracheal tube. 2. Mild bibasilar airspace disease is now present, left greater than right. While this likely represents atelectasis, infection is not excluded.   Electronically Signed   By: Lawrence Santiago M.D.   On: 03/05/2013 07:30   Dg Abd Portable 1v  03/05/2013   CLINICAL DATA:  Assess nasogastric tube placement.  EXAM: PORTABLE ABDOMEN - 1 VIEW  COMPARISON:  DG ABD PORTABLE 1V dated 03/04/2013  FINDINGS: The nasogastric tube tip and proximal port lie below the expected location of the GE junction likely in the distal gastric body or proximal pyloric region. There is a right-sided venous catheter whose tip lies in the region of the right common iliac artery. The bowel gas pattern is nonspecific. There is an aorta bi-iliac graft in place.  IMPRESSION: Positioning of the nasogastric tube is adequate for suction purposes.   Electronically Signed   By: David  Martinique   On: 03/05/2013 14:30   Dg Abd Portable 1v  03/04/2013   CLINICAL DATA:  OG tube placement  EXAM: PORTABLE ABDOMEN - 1 VIEW  COMPARISON:  05/30/2012  FINDINGS: An OG tube is noted, side port projecting over the distal esophagus and tube tip at the GE junction. A right sided femoral  approach catheter projects just medial to the calcified right external iliac artery and presumably within the external iliac vein. Bases clear. Bowel gas pattern nonspecific without overt obstruction. Rounded calcifications over the lower pelvis presumably reflect fibroids. Advanced atherosclerotic vascular calcifications. Osteopenia.  IMPRESSION: OG tube side port in the distal esophagus and tube tip at the GE junction. Recommend advancement by at least 10 cm.   Electronically Signed   By: Carlos Levering M.D.   On: 03/04/2013 21:02      ASSESSMENT / PLAN:  PULMONARY A: Acute respiratory failure due to inability to protect airway.  P:   -  Extubated - Pulmonary toilet - Swallow eval  CARDIOVASCULAR A: Septic shock- Off pressors  Afib (recent GI bleed)- not on anticoag Mets criteria rel AI P:  - See ID - Cont Amiodarone infusion - Off pressors - Cont steroids 25 q6h and taper further to q12h - Consider addition home coreg and dc amio as BP improved and amio not home med  RENAL A:  ESRD, HD done 1/29 Hypophosphatemia- reoccuring after prevuious replacement. Now- 1.9 today- 1/30 P:   - follow Bmets. - Nephrology following.   GASTROINTESTINAL A:  Hx of Gi bleed- Duodenitis/gastritis EGD- 02/14/2013 Diarrhea- 3 episodes since yesterday P:   - BID protonix for 2 months. - Rectal Tube, to prev infection of sacral decub ulcer present. -send cdiff(negative) Swallow eval  HEMATOLOGIC A:  Leukocytosis- resolving Elevated PT-INR, not on Anticoag- Now normalized error? Highly likely P: , - Scds   INFECTIOUS A: septic shock source unclear, decub unimpressive, r/o bacteremia on HD, stools increased P:   - On ceftriaxone, add stop date totoal 7 days  - Blood cultures pending. Tracheal asp- neg. -send cdiff(neg)  ENDOCRINE A:  DM by history, rel AI, hypothyroidism P:   - ICU hyperglycemia protocol- CBGs- 122-144. - Continue steroids but reduce - TSH normal, can restart  home synthroid  NEUROLOGIC A:  AMS likely related to sepsis- Head Ct neg for Acute infrarct or hemmorrhage. - H/o dementia - H/o Parkinsons Concerned her fxnal status poor, NOT confirmed by daughter P:   - upright - avoid benzo - Start parkinson medications meds when abel to take oral, cant crush, after swallow evaluation 1/31  Richardson Landry Minor ACNP Maryanna Shape PCCM Pager [phone] till 3 pm If no answer page [phone] 03/06/2013, 11:07 AM  Hemodynamically stable, needs NGT and beginning of TF as she failed swallow evaluation.  Will place those then transfer to SDU with TRH to pick up on 2/1.  PCCM will sign off, please call back if needed.  Patient seen and examined, agree with above note.  I dictated the care and orders written for this patient under my direction.  Rush Farmer, MD [phone]

## 2013-03-06 NOTE — Progress Notes (Signed)
[facility] [place] [facility] Electrolyte Replacement Protocol  Patient Name: KYOMI HECTOR DOB: [birth year] MRN: [id number]  Date of Service  03/06/2013   HPI/Events of Note    Recent Labs Lab 03/02/13 1645 03/03/13 0415 03/04/13 0255 03/04/13 0500 03/04/13 2345 03/05/13 0900 03/05/13 0935 03/05/13 2250 03/06/13 0400  NA 150* 148* 144  --   --  137  --   --  139  K 2.2* 4.1 4.2  --   --  3.1*  --   --  3.1*  CL 119* 112 108  --   --  96  --   --  97  CO2 12* 19 18*  --   --  23  --   --  26  GLUCOSE 175* 139* 163*  --   --  220*  --   --  95  BUN 18 27* 30*  --   --  14  --   --  17  CREATININE 2.26* 3.61* 4.00*  --   --  2.11*  --   --  2.77*  CALCIUM 5.8* 9.5 9.5  --   --  8.2*  --   --  8.4  MG 1.1* 1.7  --  1.8 1.6  --  2.2 2.1  --   PHOS 1.6* 1.6* 4.6 4.7* 1.9*  --  3.3 3.7  --     Estimated Creatinine Clearance: 16.8 ml/min (by C-G formula based on Cr of 2.77).  Intake/Output     01/30 0701 - 01/31 0700   I.V. (mL/kg) 903.4 (14.2)   NG/GT 25   IV Piggyback 50   Total Intake(mL/kg) 978.4 (15.4)   Stool 100   Total Output 100   Net +878.4        - I/O DETAILED x24h    Total I/O In: 460 [I.V.:460] Out: -  - I/O THIS SHIFT    ASSESSMENT   eICURN Interventions  +3.1 Pt is on dialysis. Electrolyte protocol criteria not met. Value replaced not per protocol. MD notified.   ASSESSMENT: MAJOR ELECTROLYTE    Lorene Dy 03/06/2013, 6:01 AM

## 2013-03-07 ENCOUNTER — Inpatient Hospital Stay (HOSPITAL_COMMUNITY): Payer: Medicare Other

## 2013-03-07 LAB — GLUCOSE, CAPILLARY
GLUCOSE-CAPILLARY: 101 mg/dL — AB (ref 70–99)
GLUCOSE-CAPILLARY: 157 mg/dL — AB (ref 70–99)
Glucose-Capillary: 119 mg/dL — ABNORMAL HIGH (ref 70–99)
Glucose-Capillary: 120 mg/dL — ABNORMAL HIGH (ref 70–99)
Glucose-Capillary: 78 mg/dL (ref 70–99)
Glucose-Capillary: 82 mg/dL (ref 70–99)

## 2013-03-07 LAB — BASIC METABOLIC PANEL
BUN: 10 mg/dL (ref 6–23)
CHLORIDE: 97 meq/L (ref 96–112)
CO2: 26 meq/L (ref 19–32)
Calcium: 8.6 mg/dL (ref 8.4–10.5)
Creatinine, Ser: 2.26 mg/dL — ABNORMAL HIGH (ref 0.50–1.10)
GFR calc Af Amer: 23 mL/min — ABNORMAL LOW (ref >90)
GFR calc non Af Amer: 20 mL/min — ABNORMAL LOW (ref >90)
Glucose, Bld: 125 mg/dL — ABNORMAL HIGH (ref 70–99)
Potassium: 3.7 mEq/L (ref 3.7–5.3)
Sodium: 140 mEq/L (ref 137–147)

## 2013-03-07 MED ORDER — NEPRO/CARBSTEADY PO LIQD: 237.0000 mL | ORAL | Status: DC | PRN

## 2013-03-07 MED ORDER — CARVEDILOL 25 MG PO TABS
25.0000 mg | ORAL_TABLET | Freq: Two times a day (BID) | ORAL | Status: DC
  Filled 2013-03-07 (×2): qty 1

## 2013-03-07 MED ORDER — METOPROLOL TARTRATE 1 MG/ML IV SOLN
5.0000 mg | Freq: Four times a day (QID) | INTRAVENOUS | Status: DC
  Administered 2013-03-07 – 2013-03-08 (×5): 5 mg via INTRAVENOUS
  Filled 2013-03-07 (×8): qty 5

## 2013-03-07 MED ORDER — HEPARIN SODIUM (PORCINE) 1000 UNIT/ML DIALYSIS
2000.0000 [IU] | Freq: Once | INTRAMUSCULAR | Status: DC
  Filled 2013-03-07: qty 2

## 2013-03-07 MED ORDER — HEPARIN SODIUM (PORCINE) 1000 UNIT/ML DIALYSIS: 1000.0000 [IU] | INTRAMUSCULAR | Status: DC | PRN

## 2013-03-07 MED ORDER — SODIUM CHLORIDE 0.9 % IV SOLN: 100.0000 mL | INTRAVENOUS | Status: DC | PRN

## 2013-03-07 MED ORDER — HYDROCORTISONE SOD SUCCINATE 100 MG IJ SOLR
25.0000 mg | INTRAMUSCULAR | Status: DC
  Administered 2013-03-08: 25 mg via INTRAVENOUS
  Filled 2013-03-07 (×2): qty 0.5

## 2013-03-07 MED ORDER — LIDOCAINE-PRILOCAINE 2.5-2.5 % EX CREA: 1.0000 "application " | TOPICAL_CREAM | CUTANEOUS | Status: DC | PRN

## 2013-03-07 MED ORDER — PENTAFLUOROPROP-TETRAFLUOROETH EX AERO: 1.0000 "application " | INHALATION_SPRAY | CUTANEOUS | Status: DC | PRN

## 2013-03-07 MED ORDER — ALTEPLASE 2 MG IJ SOLR
2.0000 mg | Freq: Once | INTRAMUSCULAR | Status: AC | PRN
  Filled 2013-03-07: qty 2

## 2013-03-07 MED ORDER — LABETALOL HCL 5 MG/ML IV SOLN
20.0000 mg | INTRAVENOUS | Status: DC | PRN
  Administered 2013-03-07: 20 mg via INTRAVENOUS
  Filled 2013-03-07: qty 4

## 2013-03-07 MED ORDER — LIDOCAINE HCL (PF) 1 % IJ SOLN: 5.0000 mL | INTRAMUSCULAR | Status: DC | PRN

## 2013-03-07 NOTE — Progress Notes (Signed)
Name: Katrina Meyer MRN: [id number] DOB: July 07, 1932    ADMISSION DATE:  03/02/2013 CONSULTATION DATE:  03/02/2013  REFERRING MD :  Dr. Tomi Bamberger PRIMARY SERVICE: PCCM  BRIEF PATIENT DESCRIPTION: 80 year old female with PMH of ESRD- HD- MWF, parkinsons, A. Fib, HLD, HTN, dementia, who went to dialysis on the day PTP became hypotensive, lethargic and then unresponsive. Brought to the ED, Code sepsis was called and PCCM was asked to admit.  SIGNIFICANT EVENTS / STUDIES:  1/27 AMS, septic shock. 1/28- pressors improved 1/29 apnea  on wean 1/30- increased stools 1/30 extubated 1/31 failed swallow evaluation LINES / TUBES: ET tube 1/27>>>1/30 R IJ TLC 1/27>>>  CULTURES: Blood 1/27>>> Urine 1/27>>> No urine. Sputum 1/27>>> Final No growth. cdiff 1/30>>>neg  ANTIBIOTICS: Vancomycin 1/27>>>1/ 27 Zosyn 1/27>>>1/29 Ceftriazone 1/29 >>>  SUBJECTIVE:  Extubated , nad  VITAL SIGNS: Temp:  [97.5 F (36.4 C)-98.2 F (36.8 C)] 98.2 F (36.8 C) (02/01 1100) Pulse Rate:  [79-126] 118 (02/01 1100) Resp:  [13-24] 18 (02/01 1100) BP: (87-183)/(54-117) 157/82 mmHg (02/01 1100) SpO2:  [97 %-100 %] 100 % (02/01 1100) Weight:  [136 lb 7.4 oz (61.9 kg)-142 lb 6.7 oz (64.6 kg)] 138 lb 3.7 oz (62.7 kg) (02/01 0400) HEMODYNAMICS:   VENTILATOR SETTINGS:   INTAKE / OUTPUT: Intake/Output     01/31 0701 - 02/01 0700 02/01 0701 - 02/02 0700   I.V. (mL/kg)     Other 200    NG/GT     IV Piggyback 50    Total Intake(mL/kg) 250 (4)    Other 2688    Stool     Total Output 2688     Net -2438            PHYSICAL EXAMINATION: General: Only grunts to questions Neuro:  Awake, follows some commands, eyes open HEENT:  [place]/AT.  Cardiovascular: irregular rate, tachycardic, in A fib- per tele.  Lungs:  Diffuse upper airway sounds. Abdomen:  Soft, NT, ND and +BS. Musculoskeletal:   No pedal edema or tenderness.  L AKA. Skin:  Clean healing incision on the L AKA. Rt 2 nd metacarpal with vascular  ulcer  LABS:  CBC  Recent Labs Lab 03/04/13 0255 03/05/13 0310 03/06/13 0400  WBC 11.7* 10.6* 8.9  HGB 9.4* 9.9* 9.6*  HCT 29.6* 31.3* 29.9*  PLT 276 269 257   Coag's  Recent Labs Lab 03/03/13 1125 03/04/13 0255 03/05/13 0310  APTT 106* 41* 26  INR 5.09* 1.27 1.12   BMET  Recent Labs Lab 03/04/13 0255 03/05/13 0900 03/06/13 0400  NA 144 137 139  K 4.2 3.1* 3.1*  CL 108 96 97  CO2 18* 23 26  BUN 30* 14 17  CREATININE 4.00* 2.11* 2.77*  GLUCOSE 163* 220* 95   Electrolytes  Recent Labs Lab 03/04/13 0255  03/04/13 2345 03/05/13 0900 03/05/13 0935 03/05/13 2250 03/06/13 0400  CALCIUM 9.5  --   --  8.2*  --   --  8.4  MG  --   < > 1.6  --  2.2 2.1  --   PHOS 4.6  < > 1.9*  --  3.3 3.7  --   < > = values in this interval not displayed. Sepsis Markers  Recent Labs Lab 03/02/13 1315 03/02/13 1645  LATICACIDVEN 7.93* 2.0   ABG  Recent Labs Lab 03/04/13 1235 03/04/13 1740 03/05/13 0925  PHART 7.353 7.531* 7.524*  PCO2ART 36.1 33.1* 35.7  PO2ART 162.0* 150.0* 148.0*   Liver Enzymes  Recent Labs Lab 03/02/13 1340 03/02/13 1645 03/04/13 0255  AST 30 21 15   ALT <5 <5 <5  ALKPHOS 103 74 87  BILITOT 0.3 0.2* 0.2*  ALBUMIN 1.7* 1.2* 1.6*   Cardiac Enzymes  Recent Labs Lab 03/02/13 1645  PROBNP 13840.0*   Glucose  Recent Labs Lab 03/06/13 1238 03/06/13 2010 03/07/13 0034 03/07/13 0401 03/07/13 0724 03/07/13 1108  GLUCAP 140* 88 78 82 101* 119*    Imaging Dg Chest Port 1 View  03/07/2013   CLINICAL DATA:  Next abated  EXAM: PORTABLE CHEST - 1 VIEW  COMPARISON:  Answer previous  FINDINGS: Nasogastric tube has been removed. Stable mild cardiomegaly. Atheromatous tortuous aorta. Persistent atelectasis/ consolidation at the left lung base. . No definite effusion. Visualized skeletal structures are unremarkable.  IMPRESSION: 1. Nasogastric tube removal. Otherwise stable appearance since previous exam   Electronically Signed   By:  Arne Cleveland M.D.   On: 03/07/2013 08:33   Dg Chest Port 1 View  03/06/2013   CLINICAL DATA:  Patient extubated.  EXAM: PORTABLE CHEST - 1 VIEW  COMPARISON:  03/05/2013  FINDINGS: Endotracheal tube has been removed. Nasogastric tube is stable passing below the diaphragm into the stomach.  Cardiac silhouette is mildly enlarged.  The aorta is uncoiled.  Prominent bronchovascular markings are stable. There is left basilar opacity that is likely atelectasis. A small effusion is suspected. No convincing infiltrate or overt edema. No pneumothorax.  IMPRESSION: Status post extubation.  No other change from the prior study.   Electronically Signed   By: Lajean Manes M.D.   On: 03/06/2013 07:42   Dg Abd Portable 1v  03/05/2013   CLINICAL DATA:  Assess nasogastric tube placement.  EXAM: PORTABLE ABDOMEN - 1 VIEW  COMPARISON:  DG ABD PORTABLE 1V dated 03/04/2013  FINDINGS: The nasogastric tube tip and proximal port lie below the expected location of the GE junction likely in the distal gastric body or proximal pyloric region. There is a right-sided venous catheter whose tip lies in the region of the right common iliac artery. The bowel gas pattern is nonspecific. There is an aorta bi-iliac graft in place.  IMPRESSION: Positioning of the nasogastric tube is adequate for suction purposes.   Electronically Signed   By: David  Martinique   On: 03/05/2013 14:30   Dg Swallowing Func-speech Pathology  03/06/2013   Earle Gell McCoy, CCC-SLP     03/06/2013 12:38 PM Objective Swallowing Evaluation: Modified Barium Swallowing Study   Patient Details  Name: Katrina Meyer MRN: [id number] Date of Birth: January 07, 1933  Today's Date: 03/06/2013 Time: 1200-1220 SLP Time Calculation (min): 20 min  Past Medical History:  Past Medical History  Diagnosis Date  . Hyperlipidemia     takes Lipitor daily  . DVT (deep venous thrombosis)   . Stroke   . Parkinson's disease     takes Sinemet daily  . Tibia/fibula fracture 01/04/2012    Treated with long  leg casting. Cast change in hospital 02/27/12    . Peripheral vascular disease 02/27/2012    Skin ulcer of right great toe  . Atrial fibrillation 03/09/2012    Seen on older EKG as well but family not aware.   . Macrocytic anemia 03/09/2012  . Lower GI bleed 03/10/2012    hematochezia secondary to ulcerated polyps; diverticulosis;  chronic diarrhea  . End stage renal disease on dialysis     [facility]- MWF  . Hypertension     takes Coreg daily  . Dementia  takes Aricept daily  . Diabetes mellitus, type II     takes Lantus and Novolog  . Seasonal allergies     takes Allegra daily  . Depression     takes Prozac daily  . Hypothyroidism     takes Synthroid daily   Past Surgical History:  Past Surgical History  Procedure Laterality Date  . Dg av dialysis graft declot or    . Av fistula repair  Aug. 2013    Left arm  . Cholecystectomy    . Colon surgery      Aprrox 2006 at [facility] after she had a colonoscopy  . Colonoscopy  03/10/2012    Procedure: COLONOSCOPY;  Surgeon: Rogene Houston, MD;   Location: [facility];  Service: Endoscopy;  Laterality: N/A;  . Esophagogastroduodenoscopy  03/10/2012    Procedure: ESOPHAGOGASTRODUODENOSCOPY (EGD);  Surgeon: Rogene Houston, MD;  Location: [facility];  Service: Endoscopy;   Laterality: N/A;  EGD if TCS normal.  . Ligation of arteriovenous  fistula Left 03/24/2012    Procedure: LIGATION OF ARTERIOVENOUS  FISTULA;  Surgeon: Conrad Ladoga, MD;  Location: [place];  Service: Vascular;  Laterality:  Left;  . Av fistula placement Right 04/28/2012    Procedure: INSERTION OF ARTERIOVENOUS (AV) GORE-TEX GRAFT ARM;   Surgeon: Elam Dutch, MD;  Location: [facility] OR;  Service:  Vascular;  Laterality: Right;  . Leg amputation above knee Left   . Angioplast of lle  12/2012-01/2013    Several Procedures  . Esophagogastroduodenoscopy N/A 02/14/2013    Procedure: ESOPHAGOGASTRODUODENOSCOPY (EGD);  Surgeon: Milus Banister, MD;  Location: [place];  Service: Endoscopy;   Laterality: N/A;  .  Cardiac catheterization  03/08/2004    Normal coronary arteries and normal LV function. Normal  aortoiliac anatomy suitable for renal transplant.  . Transthoracic echocardiogram  02/07/2004    EF 60%, no evidence of significant valvular abnormalities.   HPI:  80 year old female with PMH of ESRD- HD- MWF, parkinsons, A. Fib,  HLD, HTN, dementia,  Gi bleed- Duodenitis/gastritis EGD-  02/14/2013 who went to dialysis on the day PTP became hypotensive,  lethargic and then unresponsive. Diagnosed with septic shock of  unknown origin. Intubated 1/27-1/30. Admitting CXR and head CT  negative for acute changes.      Assessment / Plan / Recommendation Clinical Impression  Dysphagia Diagnosis: Moderate oral phase dysphagia;Mild  pharyngeal phase dysphagia Clinical impression: MBS complete. Patient presents with a  moderate oral and a mild pharyngeal dysphagia with an exacerbated  aspiration risk based on AMS/exacerbated dementia. Oral phase  characterized by oral holding requiring max verbal, visual, and  tactile cueing to facilitate oral transit. Combination of delayed  swallow initiation, mild oral residuals, and mild vallecular  residuals post swallows result in trace but silent penetration of  all liquids consistencies tested with eventual silent aspiration.  Patient unable to follow clinician commands for hard cough in  attempts to clear. Aspiration risk high. Prognosis for  improvement good with improved mentation.     Treatment Recommendation  Therapy as outlined in treatment plan below    Diet Recommendation NPO;Alternative means - temporary   Medication Administration: Via alternative means    Other  Recommendations Oral Care Recommendations: Oral care Q4  per protocol   Follow Up Recommendations       Frequency and Duration min 2x/week  2 weeks           General HPI: 80 year  old female with PMH of ESRD- HD- MWF,  parkinsons, A. Fib, HLD, HTN, dementia,  Gi bleed-  Duodenitis/gastritis EGD- 02/14/2013 who went to dialysis  on the  day PTP became hypotensive, lethargic and then unresponsive.  Diagnosed with septic shock of unknown origin. Intubated  1/27-1/30. Admitting CXR and head CT negative for acute changes.  Type of Study: Modified Barium Swallowing Study Reason for Referral: Objectively evaluate swallowing function Previous Swallow Assessment: n/a-prior to admission Diet Prior to this Study: NPO Temperature Spikes Noted: No Respiratory Status: Nasal cannula History of Recent Intubation: Yes Length of Intubations (days): 3 days Date extubated: 03/05/13 Behavior/Cognition: Alert;Requires cueing;Decreased sustained  attention;Confused Oral Cavity - Dentition: Poor condition;Missing dentition Oral Motor / Sensory Function: Within functional limits (although  unable to formally test due to AMS) Self-Feeding Abilities: Needs assist Patient Positioning: Upright in chair Baseline Vocal Quality: Clear Volitional Cough: Cognitively unable to elicit Volitional Swallow: Able to elicit Anatomy: Within functional limits Pharyngeal Secretions: Not observed secondary MBS    Reason for Referral Objectively evaluate swallowing function   Oral Phase Oral Preparation/Oral Phase Oral Phase: Impaired Oral - Honey Oral - Honey Teaspoon: Reduced posterior propulsion;Holding of  bolus;Lingual/palatal residue;Delayed oral transit Oral - Nectar Oral - Nectar Cup: Reduced posterior propulsion;Holding of  bolus;Lingual/palatal residue;Delayed oral transit Oral - Thin Oral - Thin Cup: Reduced posterior propulsion;Holding of  bolus;Lingual/palatal residue;Delayed oral transit Oral - Solids Oral - Puree: Reduced posterior propulsion;Holding of  bolus;Lingual/palatal residue;Delayed oral transit   Pharyngeal Phase Pharyngeal Phase Pharyngeal Phase: Impaired Pharyngeal - Honey Pharyngeal - Honey Teaspoon: Delayed swallow initiation;Premature  spillage to valleculae;Pharyngeal residue - valleculae;Pharyngeal  residue - pyriform sinuses;Penetration/Aspiration  after  swallow;Trace aspiration Penetration/Aspiration details (honey teaspoon): Material enters  airway, passes BELOW cords without attempt by patient to eject  out (silent aspiration);Material enters airway, CONTACTS cords  and not ejected out Pharyngeal - Nectar Pharyngeal - Nectar Cup: Delayed swallow initiation;Premature  spillage to valleculae;Pharyngeal residue - valleculae;Pharyngeal  residue - pyriform sinuses;Penetration/Aspiration during  swallow;Penetration/Aspiration after swallow;Trace aspiration Penetration/Aspiration details (nectar cup): Material enters  airway, passes BELOW cords without attempt by patient to eject  out (silent aspiration);Material enters airway, CONTACTS cords  and not ejected out Pharyngeal - Thin Pharyngeal - Thin Cup: Delayed swallow initiation;Pharyngeal  residue - valleculae;Pharyngeal residue - pyriform  sinuses;Penetration/Aspiration during  swallow;Penetration/Aspiration after swallow;Trace  aspiration;Premature spillage to pyriform sinuses Penetration/Aspiration details (thin cup): Material enters  airway, passes BELOW cords without attempt by patient to eject  out (silent aspiration);Material enters airway, CONTACTS cords  and not ejected out Pharyngeal - Solids Pharyngeal - Puree: Delayed swallow initiation;Premature spillage  to valleculae;Pharyngeal residue - valleculae;Pharyngeal residue  - pyriform sinuses;Penetration/Aspiration after swallow Penetration/Aspiration details (puree): Material enters airway,  remains ABOVE vocal cords then ejected out  Cervical Esophageal Phase    GO  Gabriel Rainwater MA, CCC-SLP [phone]   Cervical Esophageal Phase Cervical Esophageal Phase: [facility]         McCoy Leah Meryl 03/06/2013, 12:38 PM       ASSESSMENT / PLAN:  PULMONARY A: Acute respiratory failure due to inability to protect airway.  P:   - Extubated - Pulmonary toilet - Swallow eval , failed eval  CARDIOVASCULAR A: Septic shock- Off pressors  Afib (recent GI  bleed)- not on anticoag Mets criteria rel AI P:  - See ID - Off Amiodarone infusion on beta blocker - Off pressors - Cont steroids 25 down to qd 2/1   RENAL A:  ESRD, HD done 1/29  Hypophosphatemia- reoccuring after prevuious replacement. P:   - follow Bmets. - Nephrology following.   GASTROINTESTINAL A:  Hx of Gi bleed- Duodenitis/gastritis EGD- 02/14/2013 Diarrhea- 3 episodes since yesterday P:   - BID protonix for 2 months. - Rectal Tube, to prev infection of sacral decub ulcer present. -send cdiff(negative) Swallow eval failed will need feed tube  HEMATOLOGIC Lab Results  Component Value Date   INR 1.12 03/05/2013   INR 1.27 03/04/2013   INR 5.09* 03/03/2013    A:  Leukocytosis- resolving Elevated PT-INR, not on Anticoag- Now normalized error? Highly likely P: , - Scds   INFECTIOUS A: septic shock source unclear, decub unimpressive, r/o bacteremia on HD, stools increased P:   - On ceftriaxone, add stop date totoal 7 days  - Blood cultures pending. Tracheal asp- neg. -send cdiff(neg)  ENDOCRINE A:  DM by history, rel AI, hypothyroidism P:   - ICU hyperglycemia protocol- CBGs- 122-144. - Continue steroids but reduce - TSH normal, can restart home synthroid  NEUROLOGIC A:  AMS likely related to sepsis- Head Ct neg for Acute infrarct or hemmorrhage. - H/o dementia - H/o Parkinsons Concerned her fxnal status poor, NOT confirmed by daughter P:   - upright - avoid benzo - Start parkinson medications meds when abel to take oral, cant crush, after swallow evaluation 1/31 failed   Global: ESRD with dementia and dysphagia. Transfer to SDU and consider transfer to triad service.      Richardson Landry Minor ACNP Maryanna Shape PCCM Pager [phone] till 3 pm If no answer page [phone] 03/07/2013, 11:40 AM  Transfer to SDU and to [facility] service, PCCM will sign off, please call back if needed.  Patient seen and examined, agree with above note.  I dictated the care and orders  written for this patient under my direction.  Rush Farmer, MD [phone]

## 2013-03-07 NOTE — Progress Notes (Signed)
Speech Language Pathology Treatment:    Patient Details Name: Katrina Meyer MRN: [id number] DOB: 10-08-1932 Today's Date: 03/07/2013 Time: 1350-1405 SLP Time Calculation (min): 15 min  Assessment / Plan / Recommendation Clinical Impression  Diagnostic treatment complete.  Patient with improved awareness, sustained attention today. Non-verbal however facial expressions noted today indicative of interest in pos. Patient able to self feed liquids with assistance. Oral phase timely. No overt indication of aspiration noted however given silent aspiration noted on MBS 1/31, would highly recommend MBS prior to diet advancement. Xray able to complete study this date. Will proceed with MBS. Hopeful for ability to advance diet and avoid non-oral means of nutrition.    HPI HPI: 80 year old female with PMH of ESRD- HD- MWF, parkinsons, A. Fib, HLD, HTN, dementia,  Gi bleed- Duodenitis/gastritis EGD- 02/14/2013 who went to dialysis on the day PTP became hypotensive, lethargic and then unresponsive. Diagnosed with septic shock of unknown origin. Intubated 1/27-1/30. Admitting CXR and head CT negative for acute changes.       SLP Plan  MBS    Recommendations Diet recommendations: NPO Medication Administration: Via alternative means              Oral Care Recommendations: Oral care Q4 per protocol Plan: [place], [place] [phone]   Katrina Meyer 03/07/2013, 2:11 PM

## 2013-03-07 NOTE — Procedures (Signed)
Objective Swallowing Evaluation: Modified Barium Swallowing Study  Patient Details  Name: Katrina Meyer MRN: [id number] Date of Birth: 12/31/1932  Today's Date: 03/07/2013 Time: 1287-8676 SLP Time Calculation (min): 18 min  Past Medical History:  Past Medical History  Diagnosis Date  . Hyperlipidemia     takes Lipitor daily  . DVT (deep venous thrombosis)   . Stroke   . Parkinson's disease     takes Sinemet daily  . Tibia/fibula fracture 01/04/2012    Treated with long leg casting. Cast change in hospital 02/27/12   . Peripheral vascular disease 02/27/2012    Skin ulcer of right great toe  . Atrial fibrillation 03/09/2012    Seen on older EKG as well but family not aware.   . Macrocytic anemia 03/09/2012  . Lower GI bleed 03/10/2012    hematochezia secondary to ulcerated polyps; diverticulosis; chronic diarrhea  . End stage renal disease on dialysis     [facility]- MWF  . Hypertension     takes Coreg daily  . Dementia     takes Aricept daily  . Diabetes mellitus, type II     takes Lantus and Novolog  . Seasonal allergies     takes Allegra daily  . Depression     takes Prozac daily  . Hypothyroidism     takes Synthroid daily   Past Surgical History:  Past Surgical History  Procedure Laterality Date  . Dg av dialysis graft declot or    . Av fistula repair  Aug. 2013    Left arm  . Cholecystectomy    . Colon surgery      Aprrox 2006 at [facility] after she had a colonoscopy  . Colonoscopy  03/10/2012    Procedure: COLONOSCOPY;  Surgeon: Malissa Hippo, MD;  Location: [facility] ENDO SUITE;  Service: Endoscopy;  Laterality: N/A;  . Esophagogastroduodenoscopy  03/10/2012    Procedure: ESOPHAGOGASTRODUODENOSCOPY (EGD);  Surgeon: Malissa Hippo, MD;  Location: [facility] SUITE;  Service: Endoscopy;  Laterality: N/A;  EGD if TCS normal.  . Ligation of arteriovenous  fistula Left 03/24/2012    Procedure: LIGATION OF ARTERIOVENOUS  FISTULA;  Surgeon: Fransisco Hertz, MD;  Location: [facility] OR;   Service: Vascular;  Laterality: Left;  . Av fistula placement Right 04/28/2012    Procedure: INSERTION OF ARTERIOVENOUS (AV) GORE-TEX GRAFT ARM;  Surgeon: Sherren Kerns, MD;  Location: [facility] OR;  Service: Vascular;  Laterality: Right;  . Leg amputation above knee Left   . Angioplast of lle  12/2012-01/2013    Several Procedures  . Esophagogastroduodenoscopy N/A 02/14/2013    Procedure: ESOPHAGOGASTRODUODENOSCOPY (EGD);  Surgeon: Rachael Fee, MD;  Location: [facility] ENDOSCOPY;  Service: Endoscopy;  Laterality: N/A;  . Cardiac catheterization  03/08/2004    Normal coronary arteries and normal LV function. Normal aortoiliac anatomy suitable for renal transplant.  . Transthoracic echocardiogram  02/07/2004    EF 60%, no evidence of significant valvular abnormalities.   HPI:  80 year old female with PMH of ESRD- HD- MWF, parkinsons, A. Fib, HLD, HTN, dementia,  Gi bleed- Duodenitis/gastritis EGD- 02/14/2013 who went to dialysis on the day PTP became hypotensive, lethargic and then unresponsive. Diagnosed with septic shock of unknown origin. Intubated 1/27-1/30. Admitting CXR and head CT negative for acute changes.      Assessment / Plan / Recommendation Clinical Impression  Dysphagia Diagnosis: Moderate pharyngeal phase dysphagia;Moderate oral phase dysphagia Clinical impression: Patient presents with a moderate oropharyngeal dysphagia, likely multi-factorial due to sensory  deficits s/p recent intubation and baseline dementia. Mentation much improved however from 1/31. Although oral delays remain, patient with improved A-P transit time, requiring only min SLP cueing verbal and visual cueing. Delayed swallow initiation remains resulting in silent penetration and aspiration of thin liquid consistencies. Otherwise, patient protecting airway with solids and nectar thick liquids. Recommend initiation of conservative diet. Hopeful for continued improvement in function with time off vent and improved mentation  although suspect some degree of baseline dysphagia given dementia diagnosis. SLP will f/u at bedside. Family education complete.     Treatment Recommendation  Therapy as outlined in treatment plan below    Diet Recommendation Dysphagia 3 (Mechanical Soft);Nectar-thick liquid   Liquid Administration via: Cup;No straw Medication Administration: Crushed with puree Supervision: Patient able to self feed;Full supervision/cueing for compensatory strategies Compensations: Slow rate;Small sips/bites Postural Changes and/or Swallow Maneuvers: Seated upright 90 degrees    Other  Recommendations Oral Care Recommendations: Oral care BID Other Recommendations: Order thickener from pharmacy;Prohibited food (jello, ice cream, thin soups);Remove water pitcher   Follow Up Recommendations   (TBD)    Frequency and Duration min 2x/week  2 weeks           General HPI: 80 year old female with PMH of ESRD- HD- MWF, parkinsons, A. Fib, HLD, HTN, dementia,  Gi bleed- Duodenitis/gastritis EGD- 02/14/2013 who went to dialysis on the day PTP became hypotensive, lethargic and then unresponsive. Diagnosed with septic shock of unknown origin. Intubated 1/27-1/30. Admitting CXR and head CT negative for acute changes.  Type of Study: Modified Barium Swallowing Study Reason for Referral: Objectively evaluate swallowing function Previous Swallow Assessment: MBS 1/31 recommended NPO Diet Prior to this Study: NPO Temperature Spikes Noted: No Respiratory Status: Nasal cannula History of Recent Intubation: Yes Length of Intubations (days): 3 days Date extubated: 03/05/13 Behavior/Cognition: Alert;Cooperative;Pleasant mood;Requires cueing;Confused Oral Cavity - Dentition: Poor condition;Missing dentition Oral Motor / Sensory Function: Within functional limits Self-Feeding Abilities: Needs assist Patient Positioning: Upright in chair Baseline Vocal Quality: Clear Volitional Cough: Cognitively unable to  elicit Volitional Swallow: Able to elicit Anatomy: Within functional limits Pharyngeal Secretions: Not observed secondary MBS    Reason for Referral Objectively evaluate swallowing function   Oral Phase Oral Preparation/Oral Phase Oral Phase: Impaired Oral - Honey Oral - Honey Teaspoon: Not tested Oral - Nectar Oral - Nectar Cup: Delayed oral transit Oral - Thin Oral - Thin Cup: Delayed oral transit Oral - Solids Oral - Puree: Delayed oral transit Oral - Mechanical Soft: Delayed oral transit Oral - Pill: Delayed oral transit (masticated pill)   Pharyngeal Phase Pharyngeal Phase Pharyngeal Phase: Impaired Pharyngeal - Honey Pharyngeal - Honey Teaspoon: Not tested Pharyngeal - Nectar Pharyngeal - Nectar Cup: Delayed swallow initiation;Premature spillage to pyriform sinuses Penetration/Aspiration details (nectar cup): Material does not enter airway Pharyngeal - Thin Pharyngeal - Thin Cup: Delayed swallow initiation;Premature spillage to pyriform sinuses;Penetration/Aspiration during swallow Penetration/Aspiration details (thin cup): Material enters airway, CONTACTS cords and not ejected out Pharyngeal - Thin Straw: Delayed swallow initiation;Premature spillage to pyriform sinuses;Penetration/Aspiration before swallow;Penetration/Aspiration during swallow Penetration/Aspiration details (thin straw): Material enters airway, passes BELOW cords without attempt by patient to eject out (silent aspiration) Pharyngeal - Solids Pharyngeal - Puree: Delayed swallow initiation;Premature spillage to valleculae;Pharyngeal residue - valleculae (trace residue) Penetration/Aspiration details (puree): Material does not enter airway Pharyngeal - Mechanical Soft: Delayed swallow initiation;Premature spillage to valleculae Pharyngeal - Pill: Delayed swallow initiation;Premature spillage to valleculae (masticated pill)  Cervical Esophageal Phase    GO  Gabriel Rainwater MA, CCC-SLP [phone]   Cervical Esophageal Phase Cervical Esophageal Phase: [facility]         Ardit Danh Meryl 03/07/2013, 4:28 PM

## 2013-03-07 NOTE — Progress Notes (Signed)
[facility] Progress Note   Subjective: awake, alert, no verbal response  Filed Vitals:   03/07/13 0600 03/07/13 0700 03/07/13 0800 03/07/13 0829  BP: 158/84 172/86 168/54   Pulse: 95 93 115   Temp:    97.6 F (36.4 C)  TempSrc:    Oral  Resp: 15 14 16    Height:      Weight:      SpO2: 100% 100% 100%   Exam Alert, no distress, chronically debilitated, no verbal response No jvd  Chest clear, poor effort Irregular, displaced and sustained PMI, no gallop or M  Abd nondistended, no mass or ascites  L AKA stump clean and dry, RLE no ulcer or gangrene, R 3rd finger darkened RUE AVG patent  Dialysis: [facility] MWF  3.5 hrs 59kg  2K/2.25 [place] Heparin 2000 RUA AVG  EPO 4600   Venofer 50 each HD   Hect none  Assessment: 1 Septic shock, resolved, no clear source 2 Hypernatremia, resolved 3 ESRD on HD  4 Anemia, Hb 9's 5 Debility / dementia, 24/7 aide care at home 6 L AKA at [facility] Jan 2015 7 UGIB Jan 2015 8 Afib, on coreg 9 Vol excess, mild 10 2HPTH, on sensipar 11 Hypophosphatemia, improved  Plan- HD tomorrow  Kelly Splinter MD pager [phone]    cell [phone] 03/07/2013, 9:46 AM   Recent Labs Lab 03/04/13 0255  03/04/13 2345 03/05/13 0900 03/05/13 0935 03/05/13 2250 03/06/13 0400  NA 144  --   --  137  --   --  139  K 4.2  --   --  3.1*  --   --  3.1*  CL 108  --   --  96  --   --  97  CO2 18*  --   --  23  --   --  26  GLUCOSE 163*  --   --  220*  --   --  95  BUN 30*  --   --  14  --   --  17  CREATININE 4.00*  --   --  2.11*  --   --  2.77*  CALCIUM 9.5  --   --  8.2*  --   --  8.4  PHOS 4.6  < > 1.9*  --  3.3 3.7  --   < > = values in this interval not displayed.  Recent Labs Lab 03/02/13 1340 03/02/13 1645 03/04/13 0255  AST 30 21 15   ALT <5 <5 <5  ALKPHOS 103 74 87  BILITOT 0.3 0.2* 0.2*  PROT 5.0* 3.5* 4.5*  ALBUMIN 1.7* 1.2* 1.6*    Recent Labs Lab 03/02/13 1340 03/02/13 1645  03/04/13 0255 03/05/13 0310  03/06/13 0400  WBC 11.1* 14.0*  < > 11.7* 10.6* 8.9  NEUTROABS 8.4* 11.1*  --   --   --   --   HGB 11.3* 10.7*  < > 9.4* 9.9* 9.6*  HCT 36.4 34.1*  < > 29.6* 31.3* 29.9*  MCV 100.3* 98.6  < > 97.7 97.8 97.1  PLT 309 326  < > 276 269 257  < > = values in this interval not displayed. Marland Kitchen albumin human  25 g Intravenous Once  . antiseptic oral rinse  15 mL Mouth Rinse QID  . cefTRIAXone (ROCEPHIN)  IV  1 g Intravenous Q24H  . chlorhexidine  15 mL Mouth Rinse BID  . [START ON 03/15/2013] darbepoetin (ARANESP) injection - DIALYSIS  40 mcg Intravenous Q Mon-HD  .  feeding supplement (NEPRO CARB STEADY)  1,000 mL Per Tube Q24H  . heparin  2,000 Units Dialysis Once in dialysis  . hydrocortisone sodium succinate  25 mg Intravenous Q12H  . insulin aspart  2-6 Units Subcutaneous Q4H  . levothyroxine  25 mcg Per Tube QAC breakfast  . metoprolol  5 mg Intravenous Q6H  . pantoprazole (PROTONIX) IV  40 mg Intravenous Q12H     sodium chloride, sodium chloride, acetaminophen (TYLENOL) oral liquid 160 mg/5 mL, heparin, lidocaine (PF), lidocaine-prilocaine, pentafluoroprop-tetrafluoroeth, sodium chloride

## 2013-03-07 NOTE — Evaluation (Signed)
Physical Therapy Evaluation Patient Details Name: Katrina Meyer MRN: [id number] DOB: 1932/05/11 Today's Date: 03/07/2013 Time: 7564-3329 PT Time Calculation (min): 23 min  PT Assessment / Plan / Recommendation History of Present Illness  Patient is a 80 yo female admitted after becoming hypotensive and unresponsive at HD.  Patient with sepsis, was intubated 1/27 - 1/30.  Patient with h/o ESRD- HD- MWF, parkinsons, A. Fib, HLD, HTN, dementia, Lt AKA  Clinical Impression  Patient presents with problems listed below.  Will benefit from acute PT to address mobility issues.  Patient has 24/7 assistance Radiographer, therapeutic) at home, and home equipment.  Recommend resume HHPT at discharge.    PT Assessment  Patient needs continued PT services    Follow Up Recommendations  Home health PT;Supervision/Assistance - 24 hour (Was using [facility] pta)    Does the patient have the potential to tolerate intense rehabilitation      Barriers to Discharge        Equipment Recommendations  Other (comment) (Long transfer board for car transfers)    Recommendations for Other Services     Frequency Min 3X/week    Precautions / Restrictions Precautions Precautions: Fall Precaution Comments: Rt femoral line - RN reports patient can sit EOB for short time. Restrictions Weight Bearing Restrictions: No   Pertinent Vitals/Pain       Mobility  Bed Mobility Overal bed mobility: Needs Assistance;+2 for physical assistance Bed Mobility: Supine to Sit;Sit to Supine Supine to sit: Max assist;+2 for physical assistance;HOB elevated Sit to supine: Total assist;+2 for physical assistance General bed mobility comments: Verbal and tactile cues for technique.  Patient able to assist with moving LE's off of bed.  Required +2 assist using bed pads to assist patient to sitting position.  Once upright, patient required mod assist for balance.  Patient able to maintain balance for short periods of 20 seconds with standby  assist.  Worked on trunk control, balance, midline position, and unilateral reaching to both sides x 10 minutes.  Patient leaning posteriorly and to left.    Exercises     PT Diagnosis: Generalized weakness;Altered mental status  PT Problem List: Decreased strength;Decreased range of motion;Decreased activity tolerance;Decreased balance;Decreased mobility;Decreased cognition;Cardiopulmonary status limiting activity;Decreased skin integrity PT Treatment Interventions: DME instruction;Functional mobility training;Balance training;Patient/family education     PT Goals(Current goals can be found in the care plan section) Acute Rehab PT Goals Patient Stated Goal: Unable to state PT Goal Formulation: With patient/family Time For Goal Achievement: 03/14/13 Potential to Achieve Goals: Fair  Visit Information  Last PT Received On: 03/07/13 Assistance Needed: +2 History of Present Illness: Patient is a 80 yo female admitted after becoming hypotensive and unresponsive at HD.  Patient with sepsis, was intubated 1/27 - 1/30.  Patient with h/o ESRD- HD- MWF, parkinsons, A. Fib, HLD, HTN, dementia, Lt AKA       Prior Functioning  Home Living Family/patient expects to be discharged to:: Private residence Living Arrangements: Spouse/significant other Available Help at Discharge: Family;Personal care attendant;Available 24 hours/day (Patient has 24/7 assist - Aides) Type of Home: House Home Access: Level entry Home Layout: One level Home Equipment: Walker - 4 wheels;Toilet riser;Grab bars - toilet;Shower seat;Wheelchair - manual Optician, dispensing, Teacher, English as a foreign language) Prior Function Level of Independence: Needs assistance Gait / Transfers Assistance Needed: Assist to pivot to chair.  Used hoyer minimally ADL's / [employer] Assistance Needed: Aides assist with bathing, dressing, toileting, meal prep. Communication Communication: Expressive difficulties (Patient non-verbal during session today.)  Cognition   Cognition Arousal/Alertness: Awake/alert Behavior During Therapy: Flat affect Overall Cognitive Status: History of cognitive impairments - at baseline (Fluctuates)    Extremity/Trunk Assessment Upper Extremity Assessment Upper Extremity Assessment: RUE deficits/detail;LUE deficits/detail RUE Deficits / Details: Decreased strength - grossly 3-/5; Decreased shoulder ROM; Middle finger discolored with wound on knuckle. LUE Deficits / Details: Decreased strength - grossly 3-/5; Decreased shoulder ROM;  Noted edema Lower Extremity Assessment Lower Extremity Assessment: RLE deficits/detail;LLE deficits/detail RLE Deficits / Details: Strength grossly 3-/5 hip and knee.  Ankle DF 2/5 with decreased ROM - unable to passively move ankle to neutral LLE Deficits / Details: AKA - Able to move hip against gravity, at leasat 3/5   Balance Balance Overall balance assessment: Needs assistance Sitting-balance support: Bilateral upper extremity supported Sitting balance-Leahy Scale: Poor Sitting balance - Comments: Patient requiring mod assist for balance.  Working on upright, midline posture, trunk control.  Worked in sitting x 10 minutes.  Patient tends to lean backward and to left Postural control: Posterior lean;Left lateral lean  End of Session PT - End of Session Activity Tolerance: Patient limited by fatigue Patient left: in bed;with call bell/phone within reach;with family/visitor present  GP     Despina Pole 03/07/2013, 3:15 PM Carita Pian. Sanjuana Kava, [place] Pager [phone]

## 2013-03-08 DIAGNOSIS — R627 Adult failure to thrive: Secondary | ICD-10-CM | POA: Diagnosis present

## 2013-03-08 DIAGNOSIS — R579 Shock, unspecified: Secondary | ICD-10-CM | POA: Diagnosis present

## 2013-03-08 LAB — GLUCOSE, CAPILLARY
GLUCOSE-CAPILLARY: 135 mg/dL — AB (ref 70–99)
GLUCOSE-CAPILLARY: 61 mg/dL — AB (ref 70–99)
Glucose-Capillary: 125 mg/dL — ABNORMAL HIGH (ref 70–99)
Glucose-Capillary: 130 mg/dL — ABNORMAL HIGH (ref 70–99)
Glucose-Capillary: 163 mg/dL — ABNORMAL HIGH (ref 70–99)
Glucose-Capillary: 80 mg/dL (ref 70–99)

## 2013-03-08 LAB — CULTURE, BLOOD (ROUTINE X 2)
CULTURE: NO GROWTH
Culture: NO GROWTH

## 2013-03-08 LAB — HEMOGLOBIN A1C
Hgb A1c MFr Bld: 6.4 % — ABNORMAL HIGH (ref <5.7)
MEAN PLASMA GLUCOSE: 137 mg/dL — AB (ref <117)

## 2013-03-08 LAB — BASIC METABOLIC PANEL
BUN: 13 mg/dL (ref 6–23)
CALCIUM: 8.5 mg/dL (ref 8.4–10.5)
CO2: 28 mEq/L (ref 19–32)
Chloride: 99 mEq/L (ref 96–112)
Creatinine, Ser: 2.98 mg/dL — ABNORMAL HIGH (ref 0.50–1.10)
GFR calc Af Amer: 16 mL/min — ABNORMAL LOW (ref >90)
GFR, EST NON AFRICAN AMERICAN: 14 mL/min — AB (ref >90)
Glucose, Bld: 132 mg/dL — ABNORMAL HIGH (ref 70–99)
Potassium: 3.3 mEq/L — ABNORMAL LOW (ref 3.7–5.3)
SODIUM: 143 meq/L (ref 137–147)

## 2013-03-08 MED ORDER — CINACALCET HCL 30 MG PO TABS
30.0000 mg | ORAL_TABLET | Freq: Every day | ORAL | Status: DC
  Administered 2013-03-08: 30 mg via ORAL
  Filled 2013-03-08 (×2): qty 1

## 2013-03-08 MED ORDER — ATORVASTATIN CALCIUM 10 MG PO TABS
10.0000 mg | ORAL_TABLET | Freq: Every evening | ORAL | Status: DC
  Administered 2013-03-08: 10 mg via ORAL
  Filled 2013-03-08 (×2): qty 1

## 2013-03-08 MED ORDER — SODIUM CHLORIDE 0.9 % IV SOLN: 100.0000 mL | INTRAVENOUS | Status: DC | PRN

## 2013-03-08 MED ORDER — LEVOTHYROXINE SODIUM 25 MCG PO TABS
25.0000 ug | ORAL_TABLET | Freq: Every day | ORAL | Status: DC
  Administered 2013-03-09: 25 ug via ORAL
  Filled 2013-03-08 (×2): qty 1

## 2013-03-08 MED ORDER — FLUOXETINE HCL 20 MG PO CAPS
40.0000 mg | ORAL_CAPSULE | Freq: Every morning | ORAL | Status: DC
  Administered 2013-03-08 – 2013-03-09 (×2): 40 mg via ORAL
  Filled 2013-03-08 (×2): qty 2

## 2013-03-08 MED ORDER — CARVEDILOL 6.25 MG PO TABS
18.7500 mg | ORAL_TABLET | Freq: Two times a day (BID) | ORAL | Status: DC
  Administered 2013-03-08 – 2013-03-09 (×2): 18.75 mg via ORAL
  Filled 2013-03-08 (×5): qty 1

## 2013-03-08 MED ORDER — ACETAMINOPHEN 160 MG/5ML PO SOLN
650.0000 mg | Freq: Four times a day (QID) | ORAL | Status: DC | PRN
  Filled 2013-03-08: qty 20.3

## 2013-03-08 MED ORDER — PANTOPRAZOLE SODIUM 40 MG PO PACK
40.0000 mg | PACK | Freq: Two times a day (BID) | ORAL | Status: DC
  Administered 2013-03-08: 40 mg
  Filled 2013-03-08 (×3): qty 20

## 2013-03-08 MED ORDER — INSULIN ASPART 100 UNIT/ML ~~LOC~~ SOLN
0.0000 [IU] | Freq: Three times a day (TID) | SUBCUTANEOUS | Status: DC
  Administered 2013-03-08: 1 [IU] via SUBCUTANEOUS
  Administered 2013-03-08: 2 [IU] via SUBCUTANEOUS
  Administered 2013-03-09: 3 [IU] via SUBCUTANEOUS

## 2013-03-08 MED ORDER — PENTAFLUOROPROP-TETRAFLUOROETH EX AERO: 1.0000 "application " | INHALATION_SPRAY | CUTANEOUS | Status: DC | PRN

## 2013-03-08 MED ORDER — CARBIDOPA-LEVODOPA 25-100 MG PO TABS
1.5000 | ORAL_TABLET | Freq: Three times a day (TID) | ORAL | Status: DC
  Administered 2013-03-08 – 2013-03-09 (×3): 1.5 via ORAL
  Filled 2013-03-08 (×5): qty 1.5

## 2013-03-08 MED ORDER — RESOURCE THICKENUP CLEAR PO POWD
ORAL | Status: DC | PRN
  Filled 2013-03-08 (×3): qty 125

## 2013-03-08 MED ORDER — INSULIN ASPART 100 UNIT/ML ~~LOC~~ SOLN: 0.0000 [IU] | Freq: Every day | SUBCUTANEOUS | Status: DC

## 2013-03-08 MED ORDER — DONEPEZIL HCL 10 MG PO TABS
10.0000 mg | ORAL_TABLET | Freq: Every day | ORAL | Status: DC
  Administered 2013-03-09: 10 mg via ORAL
  Filled 2013-03-08 (×2): qty 1

## 2013-03-08 MED ORDER — BOOST / RESOURCE BREEZE PO LIQD
1.0000 | Freq: Two times a day (BID) | ORAL | Status: DC
  Administered 2013-03-08 – 2013-03-09 (×2): 1 via ORAL

## 2013-03-08 MED ORDER — SODIUM CHLORIDE 0.9 % IJ SOLN
10.0000 mL | INTRAMUSCULAR | Status: DC | PRN
  Administered 2013-03-09 (×3): 10 mL

## 2013-03-08 MED ORDER — NEPRO/CARBSTEADY PO LIQD: 237.0000 mL | ORAL | Status: DC | PRN

## 2013-03-08 MED ORDER — LIDOCAINE HCL (PF) 1 % IJ SOLN: 5.0000 mL | INTRAMUSCULAR | Status: DC | PRN

## 2013-03-08 MED ORDER — LIDOCAINE-PRILOCAINE 2.5-2.5 % EX CREA: 1.0000 "application " | TOPICAL_CREAM | CUTANEOUS | Status: DC | PRN

## 2013-03-08 MED ORDER — ALTEPLASE 2 MG IJ SOLR: 2.0000 mg | Freq: Once | INTRAMUSCULAR | Status: DC | PRN

## 2013-03-08 MED ORDER — HEPARIN SODIUM (PORCINE) 1000 UNIT/ML DIALYSIS: 1000.0000 [IU] | INTRAMUSCULAR | Status: DC | PRN

## 2013-03-08 MED ORDER — DARBEPOETIN ALFA-POLYSORBATE 40 MCG/0.4ML IJ SOLN
INTRAMUSCULAR | Status: AC
  Filled 2013-03-08: qty 0.4

## 2013-03-08 MED ORDER — PANTOPRAZOLE SODIUM 40 MG PO PACK
40.0000 mg | PACK | Freq: Two times a day (BID) | ORAL | Status: DC
  Filled 2013-03-08 (×4): qty 20

## 2013-03-08 NOTE — Progress Notes (Signed)
Inpatient Diabetes Program Recommendations  AACE/ADA: New Consensus Statement on Inpatient Glycemic Control (2013)  Target Ranges:  Prepandial:   less than 140 mg/dL      Peak postprandial:   less than 180 mg/dL (1-2 hours)      Critically ill patients:  140 - 180 mg/dL  Results for Katrina Meyer, Katrina Meyer (MRN [id number]) as of 03/08/2013 08:20  Ref. Range 03/07/2013 19:53 03/08/2013 00:06 03/08/2013 00:39 03/08/2013 04:39 03/08/2013 07:16  Glucose-Capillary Latest Range: 70-99 mg/dL 157 (H) 61 (L) 80 125 (H) 130 (H)   Outpatient Diabetes medications: H&P says Lantus and Novolog but not recorded of med rec. Current orders for Inpatient glycemic control: ICU Hyperglycemia Protocol Novolog 2-6  Recommend discontinue ICU Protocol since patient is in stepdown and start Glycemic Control order set. Will follow. Thank you  Raoul Pitch BSN, RN,CDE Inpatient Diabetes Coordinator [phone] (team pager)

## 2013-03-08 NOTE — Progress Notes (Signed)
NUTRITION FOLLOW-UP  DOCUMENTATION CODES  Per approved criteria   -Severe malnutrition in the context of chronic illness    Intervention:   Discontinue Nepro enteral nutrition - pt no longer receiving. Add Resource Breeze po BID, each supplement provides 250 kcal and 9 grams of protein - please thicken to appropriate consistency. RD to continue to follow nutrition care plan.  Nutrition Dx:   Inadequate oral intake now r/t variable appetite AEB variable oral intake.  Goal:   Intake to meet >90% of estimated nutrition needs.  Monitor:   weight trends, lab trends, I/O's, PO intake, supplement tolerance  Assessment:   80 year old female with PMH of ESRD- HD- MWF, parkinsons, A. Fib, HLD, HTN, dementia, who went to dialysis on the day PTP became hypotensive, lethargic and then unresponsive. Brought to the ED, Code sepsis was called.   No infectious source found for sepsis.   Extubated 1/30. BSE and MBSS completed by SLP on 1/31, with recommendations for NPO status. Pt found to have significant improvement in mental status on 2/1 and repeat MBSS was completed. Pt was ordered for Dysphagia 3 diet with Nectar Thickened Liquid diet. Ate 75% of meal.  Pt was dx with severe malnutrition in the context of chronic illness in January 2015 2/2 chronic poor appetite and ongoing weight loss. Pt also with severe muscle mass loss.  Pt with an additional 6% wt loss x 1 month.  Nutrition Focused Physical Exam:   Subcutaneous Fat:  Orbital Region: moderate depletion  Upper Arm Region: moderate depletion  Thoracic and Lumbar Region: WNL   Muscle:  Temple Region: severe depletion  Clavicle Bone Region: moderate depletion  Clavicle and Acromion Bone Region: n/a  Primary school teacher Bone Region: n/a  Dorsal Hand: n/a  Patellar Region: n/a  Anterior Thigh Region: n/a  Posterior Calf Region: n/a   Edema: mild edema  Pt meets criteria for severe MALNUTRITION in the context of chronic illness as evidenced by  severe muscle mass loss, intake of <75% x at least 1 month, and wt loss of 6% x 1 month, and 10% wt loss x 2-3 months.  Potassium is low at 3.3. Magnesium and phosphorus are WNL.  Height: Ht Readings from Last 1 Encounters:  03/07/13 5\' 7"  (1.702 m)    Weight Status:   Wt Readings from Last 1 Encounters:  03/08/13 136 lb 11 oz (62 kg)  03/03/13  133 lb 13.1 oz (60.7 kg)   Re-estimated needs:  Kcal: 1700 - 1900 Protein: 80-90 gm Fluid: 1.2 L  Skin:  unstageable R heel wound unstageable R foot wound Stage III R leg wound Stage II medial sacral wound DTI to R hip unstageable R hand wound unstageable R finger wound L AKA incision   Diet Order: Dysphagia 3; Nectar Thickened Liquids   Intake/Output Summary (Last 24 hours) at 03/08/13 1238 Last data filed at 03/08/13 0900  Gross per 24 hour  Intake    180 ml  Output      0 ml  Net    180 ml    Last BM: 2/2 (diarrhea)   Labs:   Recent Labs Lab 03/04/13 2345  03/05/13 0935 03/05/13 2250 03/06/13 0400 03/07/13 1118 03/08/13 0500  NA  --   < >  --   --  139 140 143  K  --   < >  --   --  3.1* 3.7 3.3*  CL  --   < >  --   --  97  97 99  CO2  --   < >  --   --  26 26 28   BUN  --   < >  --   --  17 10 13   CREATININE  --   < >  --   --  2.77* 2.26* 2.98*  CALCIUM  --   < >  --   --  8.4 8.6 8.5  MG 1.6  --  2.2 2.1  --   --   --   PHOS 1.9*  --  3.3 3.7  --   --   --   GLUCOSE  --   < >  --   --  95 125* 132*  < > = values in this interval not displayed.  CBG (last 3)   Recent Labs  03/08/13 0439 03/08/13 0716 03/08/13 1212  GLUCAP 125* 130* 135*    Scheduled Meds: . albumin human  25 g Intravenous Once  . antiseptic oral rinse  15 mL Mouth Rinse QID  . atorvastatin  10 mg Oral q morning - 10a  . carbidopa-levodopa  1.5 tablet Oral TID  . carvedilol  18.75 mg Oral BID WC  . cefTRIAXone (ROCEPHIN)  IV  1 g Intravenous Q24H  . chlorhexidine  15 mL Mouth Rinse BID  . cinacalcet  30 mg Oral QPM  .  [START ON 03/15/2013] darbepoetin (ARANESP) injection - DIALYSIS  40 mcg Intravenous Q Mon-HD  . donepezil  10 mg Oral QHS  . feeding supplement (NEPRO CARB STEADY)  1,000 mL Per Tube Q24H  . FLUoxetine  40 mg Oral q morning - 10a  . heparin  2,000 Units Dialysis Once in dialysis  . heparin  2,000 Units Dialysis Once in dialysis  . insulin aspart  0-5 Units Subcutaneous QHS  . insulin aspart  0-9 Units Subcutaneous TID WC  . levothyroxine  25 mcg Per Tube QAC breakfast  . pantoprazole sodium  40 mg Per Tube BID    Continuous Infusions:    Inda Coke MS, RD, LDN Pager: [phone] After-hours pager: [phone]

## 2013-03-08 NOTE — Progress Notes (Signed)
[facility] ROUNDING NOTE   Subjective:   Interval History: stable patient no complaints  Objective:  Vital signs in last 24 hours:  Temp:  [97.4 F (36.3 C)-98.7 F (37.1 C)] 98.5 F (36.9 C) (02/02 0437) Pulse Rate:  [91-123] 92 (02/02 0437) Resp:  [13-20] 18 (02/02 0437) BP: (127-173)/(46-101) 149/46 mmHg (02/02 0437) SpO2:  [98 %-100 %] 100 % (02/02 0437) Weight:  [61 kg (134 lb 7.7 oz)-62 kg (136 lb 11 oz)] 62 kg (136 lb 11 oz) (02/02 0500)  Weight change: -3.6 kg (-7 lb 15 oz) Filed Weights   03/07/13 0400 03/07/13 2100 03/08/13 0500  Weight: 62.7 kg (138 lb 3.7 oz) 61 kg (134 lb 7.7 oz) 62 kg (136 lb 11 oz)    Intake/Output: I/O last 3 completed shifts: In: 170 [P.O.:120; IV Piggyback:50] Out: -    Intake/Output this shift:     Alert, no distress, chronically debilitated, no verbal response    Chest clear anteriorly no distress CVS Irregular, displaced  Abd nondistended, no mass or ascites  L AKA stump clean and dry, RLE no ulcer or gangrene, R 3rd finger darkened  RUE AVG patent    Basic Metabolic Panel:  Recent Labs Lab 03/03/13 0415 03/04/13 0255 03/04/13 0500 03/04/13 2345 03/05/13 0900 03/05/13 0935 03/05/13 2250 03/06/13 0400 03/07/13 1118 03/08/13 0500  NA 148* 144  --   --  137  --   --  139 140 143  K 4.1 4.2  --   --  3.1*  --   --  3.1* 3.7 3.3*  CL 112 108  --   --  96  --   --  97 97 99  CO2 19 18*  --   --  23  --   --  26 26 28   GLUCOSE 139* 163*  --   --  220*  --   --  95 125* 132*  BUN 27* 30*  --   --  14  --   --  17 10 13   CREATININE 3.61* 4.00*  --   --  2.11*  --   --  2.77* 2.26* 2.98*  CALCIUM 9.5 9.5  --   --  8.2*  --   --  8.4 8.6 8.5  MG 1.7  --  1.8 1.6  --  2.2 2.1  --   --   --   PHOS 1.6* 4.6 4.7* 1.9*  --  3.3 3.7  --   --   --     Liver Function Tests:  Recent Labs Lab 03/02/13 1340 03/02/13 1645 03/04/13 0255  AST 30 21 15   ALT <5 <5 <5  ALKPHOS 103 74 87  BILITOT 0.3 0.2* 0.2*   PROT 5.0* 3.5* 4.5*  ALBUMIN 1.7* 1.2* 1.6*    Recent Labs Lab 03/02/13 1340  LIPASE 20   No results found for this basename: AMMONIA,  in the last 168 hours  CBC:  Recent Labs Lab 03/02/13 1340 03/02/13 1645 03/03/13 0415 03/04/13 0255 03/05/13 0310 03/06/13 0400  WBC 11.1* 14.0* 11.9* 11.7* 10.6* 8.9  NEUTROABS 8.4* 11.1*  --   --   --   --   HGB 11.3* 10.7* 10.4* 9.4* 9.9* 9.6*  HCT 36.4 34.1* 33.0* 29.6* 31.3* 29.9*  MCV 100.3* 98.6 97.1 97.7 97.8 97.1  PLT 309 326 325 276 269 257    Cardiac Enzymes: No results found for this basename: CKTOTAL, CKMB, CKMBINDEX, TROPONINI,  in the last 168  hours  BNP: No components found with this basename: POCBNP,   CBG:  Recent Labs Lab 03/07/13 1953 03/08/13 0006 03/08/13 0039 03/08/13 0439 03/08/13 0716  GLUCAP 157* 61* 80 125* 130*    Microbiology: Results for orders placed during the hospital encounter of 03/02/13  CULTURE, BLOOD (ROUTINE X 2)     Status: None   Collection Time    03/02/13  1:35 PM      Result Value Range Status   Specimen Description BLOOD BRACHIAL ARTERY LEFT   Final   Special Requests BOTTLES DRAWN AEROBIC ONLY 10CC   Final   Culture  Setup Time     Final   Value: 03/02/2013 16:31     Performed at [facility]   Culture     Final   Value:        BLOOD CULTURE RECEIVED NO GROWTH TO DATE CULTURE WILL BE HELD FOR 5 DAYS BEFORE ISSUING A FINAL NEGATIVE REPORT     Performed at [facility]   Report Status PENDING   Incomplete  CULTURE, BLOOD (ROUTINE X 2)     Status: None   Collection Time    03/02/13  1:45 PM      Result Value Range Status   Specimen Description BLOOD HAND LEFT   Final   Special Requests BOTTLES DRAWN AEROBIC ONLY 2CC   Final   Culture  Setup Time     Final   Value: 03/02/2013 16:17     Performed at [facility]   Culture     Final   Value:        BLOOD CULTURE RECEIVED NO GROWTH TO DATE CULTURE WILL BE HELD FOR 5 DAYS BEFORE ISSUING A FINAL  NEGATIVE REPORT     Performed at [facility]   Report Status PENDING   Incomplete  CULTURE, RESPIRATORY (NON-EXPECTORATED)     Status: None   Collection Time    03/02/13  3:49 PM      Result Value Range Status   Specimen Description TRACHEAL ASPIRATE   Final   Special Requests NONE   Final   Gram Stain     Final   Value: MODERATE WBC PRESENT, PREDOMINANTLY MONONUCLEAR     NO SQUAMOUS EPITHELIAL CELLS SEEN     RARE GRAM POSITIVE COCCI IN PAIRS     RARE GRAM POSITIVE RODS     Performed at [facility]   Culture     Final   Value: FEW CANDIDA ALBICANS     Performed at [facility]   Report Status 03/04/2013 FINAL   Final  CLOSTRIDIUM DIFFICILE BY PCR     Status: None   Collection Time    03/05/13  2:29 PM      Result Value Range Status   C difficile by pcr NEGATIVE  NEGATIVE Final    Coagulation Studies: No results found for this basename: LABPROT, INR,  in the last 72 hours  Urinalysis: No results found for this basename: COLORURINE, APPERANCEUR, LABSPEC, PHURINE, GLUCOSEU, HGBUR, BILIRUBINUR, KETONESUR, PROTEINUR, UROBILINOGEN, NITRITE, LEUKOCYTESUR,  in the last 72 hours    Imaging: Dg Chest Port 1 View  03/07/2013   CLINICAL DATA:  Next abated  EXAM: PORTABLE CHEST - 1 VIEW  COMPARISON:  Answer previous  FINDINGS: Nasogastric tube has been removed. Stable mild cardiomegaly. Atheromatous tortuous aorta. Persistent atelectasis/ consolidation at the left lung base. . No definite effusion. Visualized skeletal structures are unremarkable.  IMPRESSION: 1. Nasogastric  tube removal. Otherwise stable appearance since previous exam   Electronically Signed   By: Arne Cleveland M.D.   On: 03/07/2013 08:33   Dg Swallowing Func-speech Pathology  03/07/2013   Earle Gell McCoy, CCC-SLP     03/07/2013  4:29 PM Objective Swallowing Evaluation: Modified Barium Swallowing Study   Patient Details  Name: TALYSHA DOLLARD MRN: [id number] Date of Birth: Feb 24, 1932  Today's Date:  03/07/2013 Time: [id number] SLP Time Calculation (min): 18 min  Past Medical History:  Past Medical History  Diagnosis Date  . Hyperlipidemia     takes Lipitor daily  . DVT (deep venous thrombosis)   . Stroke   . Parkinson's disease     takes Sinemet daily  . Tibia/fibula fracture 01/04/2012    Treated with long leg casting. Cast change in hospital 02/27/12    . Peripheral vascular disease 02/27/2012    Skin ulcer of right great toe  . Atrial fibrillation 03/09/2012    Seen on older EKG as well but family not aware.   . Macrocytic anemia 03/09/2012  . Lower GI bleed 03/10/2012    hematochezia secondary to ulcerated polyps; diverticulosis;  chronic diarrhea  . End stage renal disease on dialysis     [facility]- MWF  . Hypertension     takes Coreg daily  . Dementia     takes Aricept daily  . Diabetes mellitus, type II     takes Lantus and Novolog  . Seasonal allergies     takes Allegra daily  . Depression     takes Prozac daily  . Hypothyroidism     takes Synthroid daily   Past Surgical History:  Past Surgical History  Procedure Laterality Date  . Dg av dialysis graft declot or    . Av fistula repair  Aug. 2013    Left arm  . Cholecystectomy    . Colon surgery      Aprrox 2006 at [facility] after she had a colonoscopy  . Colonoscopy  03/10/2012    Procedure: COLONOSCOPY;  Surgeon: Rogene Houston, MD;   [facility];  Service: Endoscopy;  Laterality: N/A;  . Esophagogastroduodenoscopy  03/10/2012    Procedure: ESOPHAGOGASTRODUODENOSCOPY (EGD);  Surgeon: Rogene Houston, MD;  [facility];  Service: Endoscopy;   Laterality: N/A;  EGD if TCS normal.  . Ligation of arteriovenous  fistula Left 03/24/2012    Procedure: LIGATION OF ARTERIOVENOUS  FISTULA;  Surgeon: Conrad [place], MD;  Location: [place];  Service: Vascular;  Laterality:  Left;  . Av fistula placement Right 04/28/2012    Procedure: INSERTION OF ARTERIOVENOUS (AV) GORE-TEX GRAFT ARM;   Surgeon: Elam Dutch, MD;  [facility];  Service:   Vascular;  Laterality: Right;  . Leg amputation above knee Left   . Angioplast of lle  12/2012-01/2013    Several Procedures  . Esophagogastroduodenoscopy N/A 02/14/2013    Procedure: ESOPHAGOGASTRODUODENOSCOPY (EGD);  Surgeon: Milus Banister, MD;  Location: [place];  Service: Endoscopy;   Laterality: N/A;  . Cardiac catheterization  03/08/2004    Normal coronary arteries and normal LV function. Normal  aortoiliac anatomy suitable for renal transplant.  . Transthoracic echocardiogram  02/07/2004    EF 60%, no evidence of significant valvular abnormalities.   HPI:  80 year old female with PMH of ESRD- HD- MWF, parkinsons, A. Fib,  HLD, HTN, dementia,  Gi bleed- Duodenitis/gastritis EGD-  02/14/2013 who went to dialysis on the  day PTP became hypotensive,  lethargic and then unresponsive. Diagnosed with septic shock of  unknown origin. Intubated 1/27-1/30. Admitting CXR and head CT  negative for acute changes.      Assessment / Plan / Recommendation Clinical Impression  Dysphagia Diagnosis: Moderate pharyngeal phase  dysphagia;Moderate oral phase dysphagia Clinical impression: Patient presents with a moderate  oropharyngeal dysphagia, likely multi-factorial due to sensory  deficits s/p recent intubation and baseline dementia. Mentation  much improved however from 1/31. Although oral delays remain,  patient with improved A-P transit time, requiring only min SLP  cueing verbal and visual cueing. Delayed swallow initiation  remains resulting in silent penetration and aspiration of thin  liquid consistencies. Otherwise, patient protecting airway with  solids and nectar thick liquids. Recommend initiation of  conservative diet. Hopeful for continued improvement in function  with time off vent and improved mentation although suspect some  degree of baseline dysphagia given dementia diagnosis. SLP will  f/u at bedside. Family education complete.     Treatment Recommendation  Therapy as outlined in treatment plan below     Diet Recommendation Dysphagia 3 (Mechanical Soft);Nectar-thick  liquid   Liquid Administration via: Cup;No straw Medication Administration: Crushed with puree Supervision: Patient able to self feed;Full supervision/cueing  for compensatory strategies Compensations: Slow rate;Small sips/bites Postural Changes and/or Swallow Maneuvers: Seated upright 90  degrees    Other  Recommendations Oral Care Recommendations: Oral care BID Other Recommendations: Order thickener from pharmacy;Prohibited  food (jello, ice cream, thin soups);Remove water pitcher   Follow Up Recommendations   (TBD)    Frequency and Duration min 2x/week  2 weeks           General HPI: 80 year old female with PMH of ESRD- HD- MWF,  parkinsons, A. Fib, HLD, HTN, dementia,  Gi bleed-  Duodenitis/gastritis EGD- 02/14/2013 who went to dialysis on the  day PTP became hypotensive, lethargic and then unresponsive.  Diagnosed with septic shock of unknown origin. Intubated  1/27-1/30. Admitting CXR and head CT negative for acute changes.  Type of Study: Modified Barium Swallowing Study Reason for Referral: Objectively evaluate swallowing function Previous Swallow Assessment: MBS 1/31 recommended NPO Diet Prior to this Study: NPO Temperature Spikes Noted: No Respiratory Status: Nasal cannula History of Recent Intubation: Yes Length of Intubations (days): 3 days Date extubated: 03/05/13 Behavior/Cognition: Alert;Cooperative;Pleasant mood;Requires  cueing;Confused Oral Cavity - Dentition: Poor condition;Missing dentition Oral Motor / Sensory Function: Within functional limits Self-Feeding Abilities: Needs assist Patient Positioning: Upright in chair Baseline Vocal Quality: Clear Volitional Cough: Cognitively unable to elicit Volitional Swallow: Able to elicit Anatomy: Within functional limits Pharyngeal Secretions: Not observed secondary MBS    Reason for Referral Objectively evaluate swallowing function   Oral Phase Oral Preparation/Oral Phase Oral Phase: Impaired  Oral - Honey Oral - Honey Teaspoon: Not tested Oral - Nectar Oral - Nectar Cup: Delayed oral transit Oral - Thin Oral - Thin Cup: Delayed oral transit Oral - Solids Oral - Puree: Delayed oral transit Oral - Mechanical Soft: Delayed oral transit Oral - Pill: Delayed oral transit (masticated pill)   Pharyngeal Phase Pharyngeal Phase Pharyngeal Phase: Impaired Pharyngeal - Honey Pharyngeal - Honey Teaspoon: Not tested Pharyngeal - Nectar Pharyngeal - Nectar Cup: Delayed swallow initiation;Premature  spillage to pyriform sinuses Penetration/Aspiration details (nectar cup): Material does not  enter airway Pharyngeal - Thin Pharyngeal - Thin Cup: Delayed swallow initiation;Premature  spillage to pyriform sinuses;Penetration/Aspiration during  swallow Penetration/Aspiration details (thin cup): Material enters  airway, CONTACTS cords and  not ejected out Pharyngeal - Thin Straw: Delayed swallow initiation;Premature  spillage to pyriform sinuses;Penetration/Aspiration before  swallow;Penetration/Aspiration during swallow Penetration/Aspiration details (thin straw): Material enters  airway, passes BELOW cords without attempt by patient to eject  out (silent aspiration) Pharyngeal - Solids Pharyngeal - Puree: Delayed swallow initiation;Premature spillage  to valleculae;Pharyngeal residue - valleculae (trace residue) Penetration/Aspiration details (puree): Material does not enter  airway Pharyngeal - Mechanical Soft: Delayed swallow  initiation;Premature spillage to valleculae Pharyngeal - Pill: Delayed swallow initiation;Premature spillage  to valleculae (masticated pill)  Cervical Esophageal Phase    GO   Gabriel Rainwater MA, CCC-SLP [phone]  Cervical Esophageal Phase Cervical Esophageal Phase: [facility]         McCoy Leah Meryl 03/07/2013, 4:28 PM    Dg Swallowing Func-speech Pathology  03/06/2013   Earle Gell McCoy, CCC-SLP     03/06/2013 12:38 PM Objective Swallowing Evaluation: Modified Barium Swallowing Study   Patient Details   Name: TELAH SALEH MRN: [id number] Date of Birth: 15-Mar-1932  Today's Date: 03/06/2013 Time: 1200-1220 SLP Time Calculation (min): 20 min  Past Medical History:  Past Medical History  Diagnosis Date  . Hyperlipidemia     takes Lipitor daily  . DVT (deep venous thrombosis)   . Stroke   . Parkinson's disease     takes Sinemet daily  . Tibia/fibula fracture 01/04/2012    Treated with long leg casting. Cast change in hospital 02/27/12    . Peripheral vascular disease 02/27/2012    Skin ulcer of right great toe  . Atrial fibrillation 03/09/2012    Seen on older EKG as well but family not aware.   . Macrocytic anemia 03/09/2012  . Lower GI bleed 03/10/2012    hematochezia secondary to ulcerated polyps; diverticulosis;  chronic diarrhea  . End stage renal disease on dialysis     [facility]- MWF  . Hypertension     takes Coreg daily  . Dementia     takes Aricept daily  . Diabetes mellitus, type II     takes Lantus and Novolog  . Seasonal allergies     takes Allegra daily  . Depression     takes Prozac daily  . Hypothyroidism     takes Synthroid daily   Past Surgical History:  Past Surgical History  Procedure Laterality Date  . Dg av dialysis graft declot or    . Av fistula repair  Aug. 2013    Left arm  . Cholecystectomy    . Colon surgery      Aprrox 2006 at [facility] after she had a colonoscopy  . Colonoscopy  03/10/2012    Procedure: COLONOSCOPY;  Surgeon: Rogene Houston, MD;   [facility];  Service: Endoscopy;  Laterality: N/A;  . Esophagogastroduodenoscopy  03/10/2012    Procedure: ESOPHAGOGASTRODUODENOSCOPY (EGD);  Surgeon: Rogene Houston, MD;  [facility];  Service: Endoscopy;   Laterality: N/A;  EGD if TCS normal.  . Ligation of arteriovenous  fistula Left 03/24/2012    Procedure: LIGATION OF ARTERIOVENOUS  FISTULA;  Surgeon: Conrad Whitehouse, MD;  Location: [place];  Service: Vascular;  Laterality:  Left;  . Av fistula placement Right 04/28/2012    Procedure: INSERTION OF ARTERIOVENOUS (AV) GORE-TEX  GRAFT ARM;   Surgeon: Elam Dutch, MD;  [facility];  Service:  Vascular;  Laterality: Right;  . Leg amputation above knee Left   . Angioplast of lle  12/2012-01/2013    Several Procedures  .  Esophagogastroduodenoscopy N/A 02/14/2013    Procedure: ESOPHAGOGASTRODUODENOSCOPY (EGD);  Surgeon: Milus Banister, MD;  Location: [place];  Service: Endoscopy;   Laterality: N/A;  . Cardiac catheterization  03/08/2004    Normal coronary arteries and normal LV function. Normal  aortoiliac anatomy suitable for renal transplant.  . Transthoracic echocardiogram  02/07/2004    EF 60%, no evidence of significant valvular abnormalities.   HPI:  80 year old female with PMH of ESRD- HD- MWF, parkinsons, A. Fib,  HLD, HTN, dementia,  Gi bleed- Duodenitis/gastritis EGD-  02/14/2013 who went to dialysis on the day PTP became hypotensive,  lethargic and then unresponsive. Diagnosed with septic shock of  unknown origin. Intubated 1/27-1/30. Admitting CXR and head CT  negative for acute changes.      Assessment / Plan / Recommendation Clinical Impression  Dysphagia Diagnosis: Moderate oral phase dysphagia;Mild  pharyngeal phase dysphagia Clinical impression: MBS complete. Patient presents with a  moderate oral and a mild pharyngeal dysphagia with an exacerbated  aspiration risk based on AMS/exacerbated dementia. Oral phase  characterized by oral holding requiring max verbal, visual, and  tactile cueing to facilitate oral transit. Combination of delayed  swallow initiation, mild oral residuals, and mild vallecular  residuals post swallows result in trace but silent penetration of  all liquids consistencies tested with eventual silent aspiration.  Patient unable to follow clinician commands for hard cough in  attempts to clear. Aspiration risk high. Prognosis for  improvement good with improved mentation.     Treatment Recommendation  Therapy as outlined in treatment plan below    Diet Recommendation NPO;Alternative means - temporary    Medication Administration: Via alternative means    Other  Recommendations Oral Care Recommendations: Oral care Q4  per protocol   Follow Up Recommendations       Frequency and Duration min 2x/week  2 weeks           General HPI: 80 year old female with PMH of ESRD- HD- MWF,  parkinsons, A. Fib, HLD, HTN, dementia,  Gi bleed-  Duodenitis/gastritis EGD- 02/14/2013 who went to dialysis on the  day PTP became hypotensive, lethargic and then unresponsive.  Diagnosed with septic shock of unknown origin. Intubated  1/27-1/30. Admitting CXR and head CT negative for acute changes.  Type of Study: Modified Barium Swallowing Study Reason for Referral: Objectively evaluate swallowing function Previous Swallow Assessment: n/a-prior to admission Diet Prior to this Study: NPO Temperature Spikes Noted: No Respiratory Status: Nasal cannula History of Recent Intubation: Yes Length of Intubations (days): 3 days Date extubated: 03/05/13 Behavior/Cognition: Alert;Requires cueing;Decreased sustained  attention;Confused Oral Cavity - Dentition: Poor condition;Missing dentition Oral Motor / Sensory Function: Within functional limits (although  unable to formally test due to AMS) Self-Feeding Abilities: Needs assist Patient Positioning: Upright in chair Baseline Vocal Quality: Clear Volitional Cough: Cognitively unable to elicit Volitional Swallow: Able to elicit Anatomy: Within functional limits Pharyngeal Secretions: Not observed secondary MBS    Reason for Referral Objectively evaluate swallowing function   Oral Phase Oral Preparation/Oral Phase Oral Phase: Impaired Oral - Honey Oral - Honey Teaspoon: Reduced posterior propulsion;Holding of  bolus;Lingual/palatal residue;Delayed oral transit Oral - Nectar Oral - Nectar Cup: Reduced posterior propulsion;Holding of  bolus;Lingual/palatal residue;Delayed oral transit Oral - Thin Oral - Thin Cup: Reduced posterior propulsion;Holding of  bolus;Lingual/palatal residue;Delayed oral transit  Oral - Solids Oral - Puree: Reduced posterior propulsion;Holding of  bolus;Lingual/palatal residue;Delayed oral transit   Pharyngeal Phase Pharyngeal Phase Pharyngeal Phase: Impaired Pharyngeal -  Honey Pharyngeal - Honey Teaspoon: Delayed swallow initiation;Premature  spillage to valleculae;Pharyngeal residue - valleculae;Pharyngeal  residue - pyriform sinuses;Penetration/Aspiration after  swallow;Trace aspiration Penetration/Aspiration details (honey teaspoon): Material enters  airway, passes BELOW cords without attempt by patient to eject  out (silent aspiration);Material enters airway, CONTACTS cords  and not ejected out Pharyngeal - Nectar Pharyngeal - Nectar Cup: Delayed swallow initiation;Premature  spillage to valleculae;Pharyngeal residue - valleculae;Pharyngeal  residue - pyriform sinuses;Penetration/Aspiration during  swallow;Penetration/Aspiration after swallow;Trace aspiration Penetration/Aspiration details (nectar cup): Material enters  airway, passes BELOW cords without attempt by patient to eject  out (silent aspiration);Material enters airway, CONTACTS cords  and not ejected out Pharyngeal - Thin Pharyngeal - Thin Cup: Delayed swallow initiation;Pharyngeal  residue - valleculae;Pharyngeal residue - pyriform  sinuses;Penetration/Aspiration during  swallow;Penetration/Aspiration after swallow;Trace  aspiration;Premature spillage to pyriform sinuses Penetration/Aspiration details (thin cup): Material enters  airway, passes BELOW cords without attempt by patient to eject  out (silent aspiration);Material enters airway, CONTACTS cords  and not ejected out Pharyngeal - Solids Pharyngeal - Puree: Delayed swallow initiation;Premature spillage  to valleculae;Pharyngeal residue - valleculae;Pharyngeal residue  - pyriform sinuses;Penetration/Aspiration after swallow Penetration/Aspiration details (puree): Material enters airway,  remains ABOVE vocal cords then ejected out  Cervical Esophageal Phase    GO  Gabriel Rainwater MA, CCC-SLP [phone]   Cervical Esophageal Phase Cervical Esophageal Phase: [facility]         McCoy Leah Meryl 03/06/2013, 12:38 PM      Medications:     . albumin human  25 g Intravenous Once  . antiseptic oral rinse  15 mL Mouth Rinse QID  . cefTRIAXone (ROCEPHIN)  IV  1 g Intravenous Q24H  . chlorhexidine  15 mL Mouth Rinse BID  . [START ON 03/15/2013] darbepoetin (ARANESP) injection - DIALYSIS  40 mcg Intravenous Q Mon-HD  . feeding supplement (NEPRO CARB STEADY)  1,000 mL Per Tube Q24H  . heparin  2,000 Units Dialysis Once in dialysis  . heparin  2,000 Units Dialysis Once in dialysis  . hydrocortisone sodium succinate  25 mg Intravenous Q24H  . insulin aspart  2-6 Units Subcutaneous Q4H  . levothyroxine  25 mcg Per Tube QAC breakfast  . metoprolol  5 mg Intravenous Q6H  . pantoprazole (PROTONIX) IV  40 mg Intravenous Q12H   sodium chloride, sodium chloride, acetaminophen (TYLENOL) oral liquid 160 mg/5 mL, feeding supplement (NEPRO CARB STEADY), heparin, lidocaine (PF), lidocaine-prilocaine, pentafluoroprop-tetrafluoroeth, sodium chloride  Assessment/ Plan:  80 year old female with PMH of ESRD who went to dialysis on the day PTP and was noted to be hypotensive in the hospital. Patient was sent home. At home patient was noted to be more lethargic and on the night PTP was noted to be unresponsive. Written off as tired at this point and was put to bed. On the day of presentation, patient was noted to be more lethargic and was brought to the ED for lethargy. In the ED the patient was noted to be hypotensive with SBP in the 60's. Code sepsis was called and PCCM was asked to admit.  1 Septic shock resolved  Rocephin   No growth no obvious source 2. ESRD on HD TTS dialysis Saturday  3 Anemia  Stable darbepoietin given 56mcg  1/31 4 Atrial fibrillation rate controlled  Amiodarone, Coreg, digoxin   5. HTN controlled  Labetalol  6 Bones  Stable no binders due to hypophosphatemia    LOS:  6 Tamarra Geiselman W @TODAY @8 :02 AM

## 2013-03-08 NOTE — Progress Notes (Signed)
Speech Language Pathology Treatment: Dysphagia  Patient Details Name: DEVANN CRIBB MRN: [id number] DOB: [birth year] Today's Date: 03/08/2013 Time: 1520-1530 SLP Time Calculation (min): 10 min  Assessment / Plan / Recommendation Clinical Impression  Minimal Po trials provided as pt resting and refusing after several sips. Voice continues to be hoarse.  Provided education to dtr regarding thickening, textures and rationale. Also discussed aspiration precautions. Will f/u for further trials and potential for upgrade as pt progresses.    HPI HPI: 80 year old female with PMH of ESRD- HD- MWF, parkinsons, A. Fib, HLD, HTN, dementia,  Gi bleed- Duodenitis/gastritis EGD- 02/14/2013 who went to dialysis on the day PTP became hypotensive, lethargic and then unresponsive. Diagnosed with septic shock of unknown origin. Intubated 1/27-1/30. Admitting CXR and head CT negative for acute changes.    Pertinent Vitals NA  SLP Plan  Continue with current plan of care    Recommendations Diet recommendations: Dysphagia 2 (fine chop);Nectar-thick liquid Liquids provided via: Cup Medication Administration: Crushed with puree Supervision: Patient able to self feed;Full supervision/cueing for compensatory strategies Compensations: Slow rate;Small sips/bites Postural Changes and/or Swallow Maneuvers: Seated upright 90 degrees              Oral Care Recommendations: Oral care BID Follow up Recommendations: Skilled Nursing facility;24 hour supervision/assistance Plan: Continue with current plan of care    GO    [facility], MA CCC-SLP [phone]  Lynann Beaver 03/08/2013, 3:46 PM

## 2013-03-08 NOTE — Progress Notes (Signed)
Gave report to Wayne as pt is transferring to this floor.  Gave report to dialysis nurse as pt is going to dialysis first. Pt is eating her dinner. Dialysis said they would be down to get pt. Pt has daughter and caregiver at bedside. Nurse updated them on pt's transfer. Pt is no distress at this time. Will monitor.

## 2013-03-08 NOTE — Progress Notes (Signed)
Hypoglycemic Event  CBG: 61  Treatment: snack, ensure pudding  Symptoms: none   Follow-up CBG: Time: 12:40 AM  CBG Result: 80  Possible Reasons for Event: decreased po intake  Comments/MD notified:    Katrina Meyer, Blondell Reveal  Remember to initiate Hypoglycemia Order Set & complete

## 2013-03-08 NOTE — Progress Notes (Signed)
Katrina Meyer / ICU Progress Note  Katrina Meyer [id number] DOB: 11/14/1932 DOA: 03/02/2013 PCP: Glenda Chroman., MD  Brief narrative: 80 year old female patient with multiple medical problems including chronic kidney disease on dialysis, Parkinson's disease with associated dementia, chronic atrial fibrillation hypertension. She went to dialysis on the day of admission and subsequently became hypotensive with lethargy and then became unresponsive. She was brought to the emergency department and a code sepsis was initiated. She was subsequently admitted to the ICU.  Patient's shock was unresponsive to fluids and she was started on pressors but these were weaned within 24 hours. Subsequent culture data revealed no evidence of an infectious source. Because of her unresponsiveness she had been initially intubated but was extubated within 48 hours. She did fail a swallowing evaluation on 03/06/2013. 2 days later she subsequently passed her swallowing evaluation and was deemed appropriate to transfer out of the ICU.  Assessment/Plan:    Atrial fibrillation -rate mostly controlled on IV Lopressor -change to PO BB (Coreg pre admit) -not on Coumadin    SIRS  -no infectious source identified therefore was NOT sepsis etiology - has completed course of empiric abx per PCCM -suspect FTT + HD fluid removal > severe hypovolemia pre admit causing hypotension that eventually responded to rehydration    Shock - Hypovolemix -has resolved/see above    CKD (chronic kidney disease) stage V requiring chronic dialysis -per Nephro    Hypertension -moderate control-resume home meds    Diabetes mellitus, type II -cont SSI -ck HgbA1c    Hypothyroidism -cont Synthroid    Anemia in CKD (chronic kidney disease) -cont Aranesp    Parkinson's disease/Dementia -resume Sinemet and Aricept    FTT (failure to thrive) in adult -PT/OT eval -cont SLP   DVT prophylaxis: SCDs Code Status:  Full Family Communication: Daughter at bedside Disposition Plan/Expected LOS: Transfer to floor  Consultants: Nephrology PCCM >> TRH   Procedures: None  CULTURES:  Blood 1/27>>> No growth  Urine 1/27>>> No urine Sputum 1/27>>> No growth.  cdiff 1/30>>>neg  Antibiotics: Vancomycin 1/27>>>1/ 27  Zosyn 1/27>>>1/29  Ceftriazone 1/29 >>2/02  HPI/Subjective: Patient awake has limited ability to verbalize. She was able to communicate that she was hungry and wanted to eat. Daughter at bedside.  Objective: Blood pressure 148/78, pulse 105, temperature 97.6 F (36.4 C), temperature source Oral, resp. rate 16, height 5\' 7"  (1.702 m), weight 136 lb 11 oz (62 kg), SpO2 100.00%.  Intake/Output Summary (Last 24 hours) at 03/08/13 1223 Last data filed at 03/08/13 0900  Gross per 24 hour  Intake    180 ml  Output      0 ml  Net    180 ml   Exam: General: No acute respiratory distress Lungs: Clear to auscultation bilaterally without wheezes or crackles, RA Cardiovascular: Irregular rate and rhythm without murmur gallop or rub normal S1 and S2, no peripheral edema  Abdomen: Nontender, nondistended, soft, bowel sounds positive, no rebound, no ascites, no appreciable mass Musculoskeletal: No significant cyanosis, clubbing of bilateral lower extremities Neurological: Alert but unable to adequately answer orientation questions do to limited ability to verbalize, moves all extremities x 4 without focal neurological deficits, CN 2-12 intact  Scheduled Meds:  Scheduled Meds: . albumin human  25 g Intravenous Once  . antiseptic oral rinse  15 mL Mouth Rinse QID  . cefTRIAXone (ROCEPHIN)  IV  1 g Intravenous Q24H  . chlorhexidine  15 mL Mouth Rinse BID  . [START  ON 03/15/2013] darbepoetin (ARANESP) injection - DIALYSIS  40 mcg Intravenous Q Mon-HD  . feeding supplement (NEPRO CARB STEADY)  1,000 mL Per Tube Q24H  . heparin  2,000 Units Dialysis Once in dialysis  . heparin  2,000 Units Dialysis  Once in dialysis  . insulin aspart  0-5 Units Subcutaneous QHS  . insulin aspart  0-9 Units Subcutaneous TID WC  . levothyroxine  25 mcg Per Tube QAC breakfast  . metoprolol  5 mg Intravenous Q6H  . pantoprazole sodium  40 mg Per Tube BID   Data Reviewed: Basic Metabolic Panel:  Recent Labs Lab 03/03/13 0415 03/04/13 0255 03/04/13 0500 03/04/13 2345 03/05/13 0900 03/05/13 0935 03/05/13 2250 03/06/13 0400 03/07/13 1118 03/08/13 0500  NA 148* 144  --   --  137  --   --  139 140 143  K 4.1 4.2  --   --  3.1*  --   --  3.1* 3.7 3.3*  CL 112 108  --   --  96  --   --  97 97 99  CO2 19 18*  --   --  23  --   --  26 26 28   GLUCOSE 139* 163*  --   --  220*  --   --  95 125* 132*  BUN 27* 30*  --   --  14  --   --  17 10 13   CREATININE 3.61* 4.00*  --   --  2.11*  --   --  2.77* 2.26* 2.98*  CALCIUM 9.5 9.5  --   --  8.2*  --   --  8.4 8.6 8.5  MG 1.7  --  1.8 1.6  --  2.2 2.1  --   --   --   PHOS 1.6* 4.6 4.7* 1.9*  --  3.3 3.7  --   --   --    Liver Function Tests:  Recent Labs Lab 03/02/13 1340 03/02/13 1645 03/04/13 0255  AST 30 21 15   ALT <5 <5 <5  ALKPHOS 103 74 87  BILITOT 0.3 0.2* 0.2*  PROT 5.0* 3.5* 4.5*  ALBUMIN 1.7* 1.2* 1.6*    Recent Labs Lab 03/02/13 1340  LIPASE 20   CBC:  Recent Labs Lab 03/02/13 1340 03/02/13 1645 03/03/13 0415 03/04/13 0255 03/05/13 0310 03/06/13 0400  WBC 11.1* 14.0* 11.9* 11.7* 10.6* 8.9  NEUTROABS 8.4* 11.1*  --   --   --   --   HGB 11.3* 10.7* 10.4* 9.4* 9.9* 9.6*  HCT 36.4 34.1* 33.0* 29.6* 31.3* 29.9*  MCV 100.3* 98.6 97.1 97.7 97.8 97.1  PLT 309 326 325 276 269 257   BNP (last 3 results)  Recent Labs  03/02/13 1645  PROBNP 13840.0*   CBG:  Recent Labs Lab 03/08/13 0006 03/08/13 0039 03/08/13 0439 03/08/13 0716 03/08/13 1212  GLUCAP 61* 80 125* 130* 135*    Recent Results (from the past 240 hour(s))  CULTURE, BLOOD (ROUTINE X 2)     Status: None   Collection Time    03/02/13  1:35 PM       Result Value Range Status   Specimen Description BLOOD BRACHIAL ARTERY LEFT   Final   Special Requests BOTTLES DRAWN AEROBIC ONLY 10CC   Final   Culture  Setup Time     Final   Value: 03/02/2013 16:31     Performed at [facility]   Culture     Final   Value: NO  GROWTH 5 DAYS     Performed at [facility]   Report Status 03/08/2013 FINAL   Final  CULTURE, BLOOD (ROUTINE X 2)     Status: None   Collection Time    03/02/13  1:45 PM      Result Value Range Status   Specimen Description BLOOD HAND LEFT   Final   Special Requests BOTTLES DRAWN AEROBIC ONLY 2CC   Final   Culture  Setup Time     Final   Value: 03/02/2013 16:17     Performed at [facility]   Culture     Final   Value: NO GROWTH 5 DAYS     Performed at [facility]   Report Status 03/08/2013 FINAL   Final  CULTURE, RESPIRATORY (NON-EXPECTORATED)     Status: None   Collection Time    03/02/13  3:49 PM      Result Value Range Status   Specimen Description TRACHEAL ASPIRATE   Final   Special Requests NONE   Final   Gram Stain     Final   Value: MODERATE WBC PRESENT, PREDOMINANTLY MONONUCLEAR     NO SQUAMOUS EPITHELIAL CELLS SEEN     RARE GRAM POSITIVE COCCI IN PAIRS     RARE GRAM POSITIVE RODS     Performed at [facility]   Culture     Final   Value: FEW CANDIDA ALBICANS     Performed at [facility]   Report Status 03/04/2013 FINAL   Final  CLOSTRIDIUM DIFFICILE BY PCR     Status: None   Collection Time    03/05/13  2:29 PM      Result Value Range Status   C difficile by pcr NEGATIVE  NEGATIVE Final     Studies:  Recent x-ray studies have been reviewed in detail by the Attending Physician  Time spent : 59mins     Allison Ellis, ANP [facility] Office  [phone] Pager [phone]  **If unable to reach the above provider after paging please contact the [facility] @ [phone]  On-Call/Text Page:      [email or web address]      password [email or web address]  If  7PM-7AM, please contact night-coverage [email or web address] Password [email or web address] 03/08/2013, 12:23 PM   LOS: 6 days   I have personally examined this patient and reviewed the entire database. I have reviewed the above note, made any necessary editorial changes, and agree with its content.  Cherene Altes, MD [facility]

## 2013-03-09 DIAGNOSIS — I10 Essential (primary) hypertension: Secondary | ICD-10-CM

## 2013-03-09 LAB — GLUCOSE, CAPILLARY
GLUCOSE-CAPILLARY: 224 mg/dL — AB (ref 70–99)
Glucose-Capillary: 91 mg/dL (ref 70–99)
Glucose-Capillary: 115 mg/dL — ABNORMAL HIGH (ref 70–99)
Glucose-Capillary: 209 mg/dL — ABNORMAL HIGH (ref 70–99)

## 2013-03-09 MED ORDER — RESOURCE THICKENUP CLEAR PO POWD: ORAL | Status: AC

## 2013-03-09 NOTE — Progress Notes (Signed)
   CARE MANAGEMENT NOTE 03/09/2013  Patient:  DEVONDA, PEQUIGNOT   Account Number:  [id number]  Date Initiated:  03/02/2013  Documentation initiated by:  [facility]  Subjective/Objective Assessment:   Admitted with sepsis - intubated and pressors.     Action/Plan:   resumption of home health services   Anticipated DC Date:  03/09/2013   Anticipated DC Plan:  [place]  CM consult      [facility] Choice  Resumption Of Svcs/PTA Provider   Choice offered to / List presented to:             Status of service:  In process, will continue to follow Medicare Important Message given?   (If response is "NO", the following Medicare IM given date fields will be blank) Date Medicare IM given:   Date Additional Medicare IM given:    Discharge Disposition:    Per UR Regulation:  Reviewed for med. necessity/level of care/duration of stay  If discussed at [place] of Stay Meetings, dates discussed:   03/09/2013    Comments:  Contact:  Freeney,Howard Spouse [place] Daughter [phone]  03/09/2013  [place], [place] [facility]en   [phone] called regarding patient's status with home care services. Met with patient and caregiver at bedside, the patient is active with [facility] RN, PT, SP, want to continue to home health services.

## 2013-03-09 NOTE — Progress Notes (Signed)
Physical Therapy Treatment Patient Details Name: Katrina Meyer MRN: [id number] DOB: 1932-10-03 Today's Date: 03/09/2013 Time: 0923-3007 PT Time Calculation (min): 31 min  PT Assessment / Plan / Recommendation  History of Present Illness Patient is a 80 yo female admitted after becoming hypotensive and unresponsive at HD.  Patient with sepsis, was intubated 1/27 - 1/30.  Patient with h/o ESRD- HD- MWF, parkinsons, A. Fib, HLD, HTN, dementia, Lt AKA   PT Comments   Patient able to meet goal for sitting balance much improved today and able to participate in balance activities as well as self feeding.  Daughter in room and supportive of OOB transfer.  Asking about equipment received after amputation at [place] and educated her on purpose for AK splint as well as differing treatments here following amputation  Follow Up Recommendations  Home health PT;Supervision/Assistance - 24 hour     Does the patient have the potential to tolerate intense rehabilitation   N/A  Barriers to Discharge  None      Equipment Recommendations  Other (comment) (long transfer board for car transfers)    Recommendations for Other Services  None  Frequency Min 3X/week   Progress towards PT Goals Progress towards PT goals: Progressing toward goals  Plan Current plan remains appropriate    Precautions / Restrictions Precautions Precautions: Fall   Pertinent Vitals/Pain No pain complaints    Mobility  Bed Mobility Overal bed mobility: Needs Assistance;+2 for physical assistance Supine to sit: HOB elevated;Mod assist;Max assist General bed mobility comments: assist for scooting to edge of bed and to lift trunk, pt participates about 40% Transfers Overall transfer level: Needs assistance Transfers: Stand Pivot Transfers Stand pivot transfers: +2 physical assistance;Max assist General transfer comment: assist to lift and pivot    Exercises General Exercises - Upper Extremity Shoulder Flexion: AAROM;AROM;Both;10  reps;Seated     PT Goals (current goals can now be found in the care plan section)    Visit Information  Last PT Received On: 03/09/13 Assistance Needed: +2 History of Present Illness: Patient is a 80 yo female admitted after becoming hypotensive and unresponsive at HD.  Patient with sepsis, was intubated 1/27 - 1/30.  Patient with h/o ESRD- HD- MWF, parkinsons, A. Fib, HLD, HTN, dementia, Lt AKA    Subjective Data      Cognition  Cognition Arousal/Alertness: Awake/alert Behavior During Therapy: WFL for tasks assessed/performed Overall Cognitive Status: History of cognitive impairments - at baseline    Balance  Balance Overall balance assessment: Needs assistance Sitting-balance support: No upper extremity supported;Feet unsupported;Single extremity supported Sitting balance-Leahy Scale: Fair Sitting balance - Comments: sat edge of bed over 10 minutes initially falling to right due to sitting on hump in bed, scooted off hump and able to sit unsupported with supervision; dynamic sitting activities with turning to look over shoulders, raising arms up one at a time, drinking and eating with occasional CGA for safety  End of Session PT - End of Session Equipment Utilized During Treatment: Gait belt Activity Tolerance: Patient tolerated treatment well Patient left: in chair;with call bell/phone within reach;with family/visitor present   GP     [facility] 03/09/2013, 2:25 PM Magda Kiel, [place] 03/09/2013

## 2013-03-09 NOTE — Progress Notes (Signed)
Katrina Meyer discharged Home per MD order.  Discharge instructions reviewed and discussed with the patient. Patient's daughter, and caregiver, all questions and concerns answered. Copy of instructions and scripts given to patient.    Medication List    STOP taking these medications       calcium acetate (Phos Binder) 667 MG/5ML Soln  Commonly known as:  PHOSLYRA     CLARITIN-D 24 HOUR PO      TAKE these medications       atorvastatin 10 MG tablet  Commonly known as:  LIPITOR  Take 10 mg by mouth every morning.     carbidopa-levodopa 25-100 MG per tablet  Commonly known as:  SINEMET IR  Take 1.5 tablets by mouth 3 (three) times daily.     carvedilol 6.25 MG tablet  Commonly known as:  COREG  Take 3 tablets (18.75 mg total) by mouth 2 (two) times daily with a meal.     cinacalcet 30 MG tablet  Commonly known as:  SENSIPAR  Take 30 mg by mouth every evening.     donepezil 10 MG tablet  Commonly known as:  ARICEPT  Take 10 mg by mouth at bedtime.     feeding supplement (NEPRO CARB STEADY) Liqd  Take 237 mLs by mouth daily.     feeding supplement (RESOURCE BREEZE) Liqd  Take 1 Container by mouth daily.     FLUoxetine 40 MG capsule  Commonly known as:  PROZAC  Take 40 mg by mouth every morning.     levothyroxine 25 MCG tablet  Commonly known as:  SYNTHROID, LEVOTHROID  Take 25 mcg by mouth every morning.     multivitamin Tabs tablet  Take 1 tablet by mouth 2 (two) times daily.     pantoprazole 40 MG tablet  Commonly known as:  PROTONIX  Take 1 tablet (40 mg total) by mouth 2 (two) times daily before a meal.     RESOURCE THICKENUP CLEAR Powd  To thicken liquids to nectar thick        IV team called to d/c femoral line. IV site discontinued and catheter remains intact. Site without signs and symptoms of complications. Dressing and pressure applied.  Patient escorted to car by NT in a wheelchair,  no distress noted upon discharge.  Velora Mediate 03/09/2013 9:14 PM

## 2013-03-09 NOTE — Progress Notes (Addendum)
Katrina Meyer [id number] Admitted to 6E14: 03/09/2013 5:20 AM Attending Provider: Delfina Redwood, MD   History:  obtained from the patient.  Pt and caregiver orientation to unit, room and routine.  Admission INP armband ID verified with patient and in place. Side rails in place, fall risk assessment complete with patient verbalizing understanding of risks associated with falls. Pt taught how to use the call bell and to call for help before getting out of bed.   Will cont to monitor and assist as needed.  Velora Mediate, RN

## 2013-03-09 NOTE — Progress Notes (Signed)
Subjective:  Alert, active, wants to leave, no complaints  Objective: Vital signs in last 24 hours: Temp:  [97.3 F (36.3 C)-98.7 F (37.1 C)] 97.3 F (36.3 C) (02/03 0525) Pulse Rate:  [71-112] 95 (02/03 0525) Resp:  [13-27] 16 (02/03 0525) BP: (87-154)/(46-133) 144/72 mmHg (02/03 0525) SpO2:  [86 %-100 %] 100 % (02/03 0525) Weight:  [63.1 kg (139 lb 1.8 oz)-63.2 kg (139 lb 5.3 oz)] 63.2 kg (139 lb 5.3 oz) (02/03 0113) Weight change: 2.1 kg (4 lb 10.1 oz)  Intake/Output from previous day: 02/02 0701 - 02/03 0700 In: 60 [P.O.:60] Out: 1250  Intake/Output this shift:   Lab Results: No results found for this basename: WBC, HGB, HCT, PLT,  in the last 72 hours BMET:  Recent Labs  03/07/13 1118 03/08/13 0500  NA 140 143  K 3.7 3.3*  CL 97 99  CO2 26 28  GLUCOSE 125* 132*  BUN 10 13  CREATININE 2.26* 2.98*  CALCIUM 8.6 8.5   No results found for this basename: PTH,  in the last 72 hours Iron Studies: No results found for this basename: IRON, TIBC, TRANSFERRIN, FERRITIN,  in the last 72 hours  EXAM: General appearance: Alert, in no apparent distress Resp:  CTA without rales, rhonchi, or wheezes Cardio: RRR without murmur or rub GI: + BS, soft and nontender Extremities: L AKA, no edema Access: AVG @ RUA with + bruit  Dialysis: [place] MWF  3.5 hrs 59kg 2K/2.25 [place] Heparin 2000 RUA AVG  EPO 4600 Venofer 50 each HD Hect none  Assessment/Plan: 1. Septic shock - resolved, no clear source, completed empiric Rocephin. 2. Atrial fib - on Coreg, not on Coumadin.  3. ESRD - HD on MWF @ [facility], K 3.3.  HD tomorrow. 4. HTN/Volume - BP 144/72 on Coreg; wt 63.2 kg s/p net UF 1.3 L yesterday. 5. Anemia - Hgb 9.6 on Aranesp 40 mcg on Mon. 6. Sec HPT - Ca 8.5, P 3.7; Sensipar 30 mg qd, no binders.   LOS: 7 days   Jataya Wann 03/09/2013,9:12 AM

## 2013-03-09 NOTE — Progress Notes (Signed)
I have seen and examined this patient and agree with the plan of care  [facility] W 03/09/2013, 10:46 AM

## 2013-03-09 NOTE — Discharge Summary (Signed)
Physician Discharge Summary  Katrina Meyer [id number] DOB: 11-02-1932 DOA: 03/02/2013  PCP: Glenda Chroman., MD  Admit date: 03/02/2013 Discharge date: 03/09/2013  Time spent: 35 minutes  Recommendations for Outpatient Follow-up:  Home health  Discharge Diagnoses:  Active Problems:   Atrial fibrillation   Anemia in CKD (chronic kidney disease)   Parkinson's disease   CKD (chronic kidney disease) stage V requiring chronic dialysis   Hypertension   Dementia   Diabetes mellitus, type II   SIRS (systemic inflammatory response syndrome)   Shock   FTT (failure to thrive) in adult   Discharge Condition: improved  Diet recommendation: DYS  Filed Weights   03/08/13 2054 03/09/13 0113 03/09/13 1446  Weight: 63.1 kg (139 lb 1.8 oz) 63.2 kg (139 lb 5.3 oz) 61.5 kg (135 lb 9.3 oz)    History of present illness:  80 year old female with PMH of ESRD who went to dialysis on the day PTP and was noted to be hypotensive in the hospital. Patient was sent home. At home patient was noted to be more lethargic and on the night PTP was noted to be unresponsive. Written off as tired at this point and was put to bed. On the day of presentation, patient was noted to be more lethargic and was brought to the ED for lethargy. In the ED the patient was noted to be hypotensive with SBP in the 60's. Code sepsis was called and PCCM was asked to admit.   Hospital Course:  Atrial fibrillation  -change to PO BB (Coreg pre admit)  -not on Coumadin   SIRS  -no infectious source identified therefore was NOT sepsis etiology - has completed course of empiric abx per PCCM  -suspect FTT + HD fluid removal > severe hypovolemia pre admit causing hypotension that eventually responded to rehydration   Shock - Hypovolemix  -has resolved/see above   CKD (chronic kidney disease) stage V requiring chronic dialysis  -per Nephro   Hypertension  -moderate control-resume home meds   Diabetes mellitus, type II    Hypothyroidism  -cont Synthroid   Anemia in CKD (chronic kidney disease)  -cont Aranesp   Parkinson's disease/Dementia  -resume Sinemet and Aricept   FTT (failure to thrive) in adult  -PT/OT eval  -cont SLP- DYS 3, nectar thick   Procedures:  none  Consultations:  PCCM  Nephrology  Discharge Exam: Filed Vitals:   03/09/13 1530  BP: 105/68  Pulse: 99  Temp:   Resp: 18    General: pleasant, not verbal Cardiovascular: rrr Respiratory: clear anterior  Discharge Instructions      Discharge Orders   Future Orders Complete By Expires   Discharge instructions  As directed    Comments:     Resume HD- M/W/F DYS 3 diet- nectar thick   Increase activity slowly  As directed        Medication List    STOP taking these medications       calcium acetate (Phos Binder) 667 MG/5ML Soln  Commonly known as:  PHOSLYRA     CLARITIN-D 24 HOUR PO      TAKE these medications       atorvastatin 10 MG tablet  Commonly known as:  LIPITOR  Take 10 mg by mouth every morning.     carbidopa-levodopa 25-100 MG per tablet  Commonly known as:  SINEMET IR  Take 1.5 tablets by mouth 3 (three) times daily.     carvedilol 6.25 MG tablet  Commonly known as:  COREG  Take 3 tablets (18.75 mg total) by mouth 2 (two) times daily with a meal.     cinacalcet 30 MG tablet  Commonly known as:  SENSIPAR  Take 30 mg by mouth every evening.     donepezil 10 MG tablet  Commonly known as:  ARICEPT  Take 10 mg by mouth at bedtime.     feeding supplement (NEPRO CARB STEADY) Liqd  Take 237 mLs by mouth daily.     feeding supplement (RESOURCE BREEZE) Liqd  Take 1 Container by mouth daily.     FLUoxetine 40 MG capsule  Commonly known as:  PROZAC  Take 40 mg by mouth every morning.     levothyroxine 25 MCG tablet  Commonly known as:  SYNTHROID, LEVOTHROID  Take 25 mcg by mouth every morning.     multivitamin Tabs tablet  Take 1 tablet by mouth 2 (two) times daily.      pantoprazole 40 MG tablet  Commonly known as:  PROTONIX  Take 1 tablet (40 mg total) by mouth 2 (two) times daily before a meal.     RESOURCE THICKENUP CLEAR Powd  To thicken liquids to nectar thick       Allergies  Allergen Reactions  . Hydralazine Rash  . Hydrochlorothiazide Hives and Rash      The results of significant diagnostics from this hospitalization (including imaging, microbiology, ancillary and laboratory) are listed below for reference.    Significant Diagnostic Studies: Ct Head Wo Contrast  03/02/2013   CLINICAL DATA:  Altered mental status  EXAM: CT HEAD WITHOUT CONTRAST  TECHNIQUE: Contiguous axial images were obtained from the base of the skull through the vertex without intravenous contrast.  COMPARISON:  Insert previous  FINDINGS: There is moderate diffuse cerebral and cerebellar atrophy. There is stable decreased density in the deep white matter of both cerebral hemispheres consistent with chronic small vessel ischemic type change. There are calcifications within the middle cerebral arteries. There are hypodensities in the basal ganglia bilaterally consistent with previous ischemic infarctions. There is no evidence of an acute intracranial hemorrhage nor of an evolving ischemic infarction. There is stable encephalomalacia in the posterior aspect of the right cerebellar hemisphere.  At bone window settings there is no evidence of an acute skull fracture. There is mild mucoperiosteal thickening within the ethmoid sinus cells that is more conspicuous than in the past. No air-fluid levels are demonstrated.  IMPRESSION: 1. There is no evidence of an acute ischemic or hemorrhagic event. 2. There are stable changes consistent with chronic small vessel ischemia and previous ischemic infarctions. Encephalomalacia in the right cerebellar hemisphere is consistent with a previous ischemic insult. 3. There is moderate diffuse cerebral and cerebellar atrophy with compensatory  ventriculomegaly which is stable.   Electronically Signed   By: David  Martinique   On: 03/02/2013 15:28   Ct Head Wo Contrast  02/15/2013   CLINICAL DATA:  Acute mental status change  EXAM: CT HEAD WITHOUT CONTRAST  TECHNIQUE: Contiguous axial images were obtained from the base of the skull through the vertex without intravenous contrast.  COMPARISON:  MR 06/04/2012 and earlier studies  FINDINGS: Atherosclerotic and physiologic intracranial calcifications. Diffuse parenchymal atrophy. Patchy areas of hypoattenuation in deep and periventricular white matter bilaterally. Stable lacunar infarcts in the left internal capsule and caudate nucleus. Negative for acute intracranial hemorrhage, mass lesion, acute infarction, midline shift, or mass-effect. Acute infarct may be inapparent on noncontrast CT. Ventricles and sulci symmetric. Bone windows demonstrate no focal lesion.  IMPRESSION: 1. Negative for bleed or other acute intracranial process. 2. Stable atrophy, left lacunar infarcts, and nonspecific white matter changes.   Electronically Signed   By: Arne Cleveland M.D.   On: 02/15/2013 10:00   Dg Chest Port 1 View  03/07/2013   CLINICAL DATA:  Next abated  EXAM: PORTABLE CHEST - 1 VIEW  COMPARISON:  Answer previous  FINDINGS: Nasogastric tube has been removed. Stable mild cardiomegaly. Atheromatous tortuous aorta. Persistent atelectasis/ consolidation at the left lung base. . No definite effusion. Visualized skeletal structures are unremarkable.  IMPRESSION: 1. Nasogastric tube removal. Otherwise stable appearance since previous exam   Electronically Signed   By: Arne Cleveland M.D.   On: 03/07/2013 08:33   Dg Chest Port 1 View  03/06/2013   CLINICAL DATA:  Patient extubated.  EXAM: PORTABLE CHEST - 1 VIEW  COMPARISON:  03/05/2013  FINDINGS: Endotracheal tube has been removed. Nasogastric tube is stable passing below the diaphragm into the stomach.  Cardiac silhouette is mildly enlarged.  The aorta is uncoiled.   Prominent bronchovascular markings are stable. There is left basilar opacity that is likely atelectasis. A small effusion is suspected. No convincing infiltrate or overt edema. No pneumothorax.  IMPRESSION: Status post extubation.  No other change from the prior study.   Electronically Signed   By: Lajean Manes M.D.   On: 03/06/2013 07:42   Dg Chest Port 1 View  03/05/2013   CLINICAL DATA:  And endotracheal tube positioning. The patient is on a ventilator.  EXAM: PORTABLE CHEST - 1 VIEW  COMPARISON:  One-view chest 03/04/2013.  FINDINGS: Cardiac enlargement is stable. The endotracheal tube terminates 3.5 cm above the carina. Extensive atherosclerotic calcifications are again seen within the descending aorta. Mild basilar airspace disease is slightly increased, left greater than right. The upper lung fields are clear.  IMPRESSION: 1. Stable endotracheal tube. 2. Mild bibasilar airspace disease is now present, left greater than right. While this likely represents atelectasis, infection is not excluded.   Electronically Signed   By: Lawrence Santiago M.D.   On: 03/05/2013 07:30   Dg Chest Port 1 View  03/04/2013   CLINICAL DATA:  Respiratory difficulty  EXAM: PORTABLE CHEST - 1 VIEW  COMPARISON:  Yesterday  FINDINGS: Stable endotracheal tube. Lungs clear. Upper normal heart size. No pneumothorax.  IMPRESSION: Stable endotracheal tube with clear lungs.   Electronically Signed   By: Maryclare Bean M.D.   On: 03/04/2013 07:59   Dg Chest Port 1 View  03/03/2013   CLINICAL DATA:  Intubation, history Parkinson's, diabetes, hypertension, end-stage renal disease on dialysis, atrial fibrillation  EXAM: PORTABLE CHEST - 1 VIEW  COMPARISON:  Portable exam 0500 hr compared to 03/02/2013  FINDINGS: Tip of endotracheal tube projects 3.4 cm above carina.  Upper normal heart size.  Calcified tortuous thoracic aorta.  Slightly rotated to the right.  Pulmonary vascularity mediastinal contours otherwise normal.  Lungs clear.  No  pleural effusion or pneumothorax.  IMPRESSION: No acute abnormalities.   Electronically Signed   By: Lavonia Dana M.D.   On: 03/03/2013 07:55   Dg Chest Portable 1 View  03/02/2013   CLINICAL DATA:  Post intubation, sepsis  EXAM: PORTABLE CHEST - 1 VIEW  COMPARISON:  03/02/2013  FINDINGS: Cardiomediastinal silhouette is stable. Atherosclerotic calcifications of thoracic aorta again noted. Endotracheal tube in place with tip 2.1 cm above the carina. No diagnostic pneumothorax. No acute infiltrate or pulmonary edema.  IMPRESSION: No active disease. Endotracheal tube in  place. No diagnostic pneumothorax.   Electronically Signed   By: Lahoma Crocker M.D.   On: 03/02/2013 15:16   Dg Chest Port 1 View  03/02/2013   CLINICAL DATA:  Altered mental status.  EXAM: PORTABLE CHEST - 1 VIEW  COMPARISON:  Single view of the chest 02/14/2013.  FINDINGS: There is cardiomegaly without edema. The aorta is ectatic with atherosclerosis noted. Lungs are clear. No pneumothorax or pleural effusion. No focal bony abnormality.  IMPRESSION: Cardiomegaly without acute disease.   Electronically Signed   By: Inge Rise M.D.   On: 03/02/2013 14:17   Dg Chest Port 1 View  02/14/2013   CLINICAL DATA:  Edema, renal failure  EXAM: PORTABLE CHEST - 1 VIEW  COMPARISON:  02/13/2013  FINDINGS: Right IJ central line tip at the SVC RA junction level. Stable cardiomegaly with vascular congestion. No current edema. No new collapse or consolidation. Negative for effusion or pneumothorax. Atherosclerosis of the thoracic aorta. Degenerative changes of the spine.  IMPRESSION: Cardiomegaly with vascular congestion.  Stable exam.   Electronically Signed   By: Daryll Brod M.D.   On: 02/14/2013 07:21   Dg Chest Port 1 View  02/13/2013   CLINICAL DATA:  Central line placement.  EXAM: PORTABLE CHEST - 1 VIEW  COMPARISON:  Single view of the chest 06/03/2012.  FINDINGS: Right IJ catheter is in place with the tip projecting over the lower superior vena  cava. No pneumothorax. Lungs are clear. Heart size is enlarged. No pleural effusion is identified.  IMPRESSION: Tip of right IJ catheter projects over the lower superior vena cava. No pneumothorax.  Cardiomegaly without edema.   Electronically Signed   By: Inge Rise M.D.   On: 02/13/2013 02:25   Dg Abd Portable 1v  03/05/2013   CLINICAL DATA:  Assess nasogastric tube placement.  EXAM: PORTABLE ABDOMEN - 1 VIEW  COMPARISON:  DG ABD PORTABLE 1V dated 03/04/2013  FINDINGS: The nasogastric tube tip and proximal port lie below the expected location of the GE junction likely in the distal gastric body or proximal pyloric region. There is a right-sided venous catheter whose tip lies in the region of the right common iliac artery. The bowel gas pattern is nonspecific. There is an aorta bi-iliac graft in place.  IMPRESSION: Positioning of the nasogastric tube is adequate for suction purposes.   Electronically Signed   By: David  Martinique   On: 03/05/2013 14:30   Dg Abd Portable 1v  03/04/2013   CLINICAL DATA:  OG tube placement  EXAM: PORTABLE ABDOMEN - 1 VIEW  COMPARISON:  05/30/2012  FINDINGS: An OG tube is noted, side port projecting over the distal esophagus and tube tip at the GE junction. A right sided femoral approach catheter projects just medial to the calcified right external iliac artery and presumably within the external iliac vein. Bases clear. Bowel gas pattern nonspecific without overt obstruction. Rounded calcifications over the lower pelvis presumably reflect fibroids. Advanced atherosclerotic vascular calcifications. Osteopenia.  IMPRESSION: OG tube side port in the distal esophagus and tube tip at the GE junction. Recommend advancement by at least 10 cm.   Electronically Signed   By: Carlos Levering M.D.   On: 03/04/2013 21:02   Dg Swallowing Func-speech Pathology  03/07/2013   Earle Gell McCoy, CCC-SLP     03/07/2013  4:29 PM Objective Swallowing Evaluation: Modified Barium Swallowing Study    Patient Details  Name: Katrina Meyer MRN: [id number] Date of Birth: September 10, 1932  Today's Date: 03/07/2013  Time: 3810-1751 SLP Time Calculation (min): 18 min  Past Medical History:  Past Medical History  Diagnosis Date  . Hyperlipidemia     takes Lipitor daily  . DVT (deep venous thrombosis)   . Stroke   . Parkinson's disease     takes Sinemet daily  . Tibia/fibula fracture 01/04/2012    Treated with long leg casting. Cast change in hospital 02/27/12    . Peripheral vascular disease 02/27/2012    Skin ulcer of right great toe  . Atrial fibrillation 03/09/2012    Seen on older EKG as well but family not aware.   . Macrocytic anemia 03/09/2012  . Lower GI bleed 03/10/2012    hematochezia secondary to ulcerated polyps; diverticulosis;  chronic diarrhea  . End stage renal disease on dialysis     [facility]- MWF  . Hypertension     takes Coreg daily  . Dementia     takes Aricept daily  . Diabetes mellitus, type II     takes Lantus and Novolog  . Seasonal allergies     takes Allegra daily  . Depression     takes Prozac daily  . Hypothyroidism     takes Synthroid daily   Past Surgical History:  Past Surgical History  Procedure Laterality Date  . Dg av dialysis graft declot or    . Av fistula repair  Aug. 2013    Left arm  . Cholecystectomy    . Colon surgery      Aprrox 2006 at [facility] after she had a colonoscopy  . Colonoscopy  03/10/2012    Procedure: COLONOSCOPY;  Surgeon: Rogene Houston, MD;   [facility];  Service: Endoscopy;  Laterality: N/A;  . Esophagogastroduodenoscopy  03/10/2012    Procedure: ESOPHAGOGASTRODUODENOSCOPY (EGD);  Surgeon: Rogene Houston, MD;  [facility]: [facility];  Service: Endoscopy;   Laterality: N/A;  EGD if TCS normal.  . Ligation of arteriovenous  fistula Left 03/24/2012    Procedure: LIGATION OF ARTERIOVENOUS  FISTULA;  Surgeon: Conrad Owaneco, MD;  Location: [place];  Service: Vascular;  Laterality:  Left;  . Av fistula placement Right 04/28/2012    Procedure: INSERTION OF ARTERIOVENOUS  (AV) GORE-TEX GRAFT ARM;   Surgeon: Elam Dutch, MD;  Location: [facility];  Service:  Vascular;  Laterality: Right;  . Leg amputation above knee Left   . Angioplast of lle  12/2012-01/2013    Several Procedures  . Esophagogastroduodenoscopy N/A 02/14/2013    Procedure: ESOPHAGOGASTRODUODENOSCOPY (EGD);  Surgeon: Milus Banister, MD;  Location: [place];  Service: Endoscopy;   Laterality: N/A;  . Cardiac catheterization  03/08/2004    Normal coronary arteries and normal LV function. Normal  aortoiliac anatomy suitable for renal transplant.  . Transthoracic echocardiogram  02/07/2004    EF 60%, no evidence of significant valvular abnormalities.   HPI:  80 year old female with PMH of ESRD- HD- MWF, parkinsons, A. Fib,  HLD, HTN, dementia,  Gi bleed- Duodenitis/gastritis EGD-  02/14/2013 who went to dialysis on the day PTP became hypotensive,  lethargic and then unresponsive. Diagnosed with septic shock of  unknown origin. Intubated 1/27-1/30. Admitting CXR and head CT  negative for acute changes.      Assessment / Plan / Recommendation Clinical Impression  Dysphagia Diagnosis: Moderate pharyngeal phase  dysphagia;Moderate oral phase dysphagia Clinical impression: Patient presents with a moderate  oropharyngeal dysphagia, likely multi-factorial due to sensory  deficits s/p recent intubation and  baseline dementia. Mentation  much improved however from 1/31. Although oral delays remain,  patient with improved A-P transit time, requiring only min SLP  cueing verbal and visual cueing. Delayed swallow initiation  remains resulting in silent penetration and aspiration of thin  liquid consistencies. Otherwise, patient protecting airway with  solids and nectar thick liquids. Recommend initiation of  conservative diet. Hopeful for continued improvement in function  with time off vent and improved mentation although suspect some  degree of baseline dysphagia given dementia diagnosis. SLP will  f/u at bedside. Family education  complete.     Treatment Recommendation  Therapy as outlined in treatment plan below    Diet Recommendation Dysphagia 3 (Mechanical Soft);Nectar-thick  liquid   Liquid Administration via: Cup;No straw Medication Administration: Crushed with puree Supervision: Patient able to self feed;Full supervision/cueing  for compensatory strategies Compensations: Slow rate;Small sips/bites Postural Changes and/or Swallow Maneuvers: Seated upright 90  degrees    Other  Recommendations Oral Care Recommendations: Oral care BID Other Recommendations: Order thickener from pharmacy;Prohibited  food (jello, ice cream, thin soups);Remove water pitcher   Follow Up Recommendations   (TBD)    Frequency and Duration min 2x/week  2 weeks           General HPI: 80 year old female with PMH of ESRD- HD- MWF,  parkinsons, A. Fib, HLD, HTN, dementia,  Gi bleed-  Duodenitis/gastritis EGD- 02/14/2013 who went to dialysis on the  day PTP became hypotensive, lethargic and then unresponsive.  Diagnosed with septic shock of unknown origin. Intubated  1/27-1/30. Admitting CXR and head CT negative for acute changes.  Type of Study: Modified Barium Swallowing Study Reason for Referral: Objectively evaluate swallowing function Previous Swallow Assessment: MBS 1/31 recommended NPO Diet Prior to this Study: NPO Temperature Spikes Noted: No Respiratory Status: Nasal cannula History of Recent Intubation: Yes Length of Intubations (days): 3 days Date extubated: 03/05/13 Behavior/Cognition: Alert;Cooperative;Pleasant mood;Requires  cueing;Confused Oral Cavity - Dentition: Poor condition;Missing dentition Oral Motor / Sensory Function: Within functional limits Self-Feeding Abilities: Needs assist Patient Positioning: Upright in chair Baseline Vocal Quality: Clear Volitional Cough: Cognitively unable to elicit Volitional Swallow: Able to elicit Anatomy: Within functional limits Pharyngeal Secretions: Not observed secondary MBS    Reason for Referral Objectively  evaluate swallowing function   Oral Phase Oral Preparation/Oral Phase Oral Phase: Impaired Oral - Honey Oral - Honey Teaspoon: Not tested Oral - Nectar Oral - Nectar Cup: Delayed oral transit Oral - Thin Oral - Thin Cup: Delayed oral transit Oral - Solids Oral - Puree: Delayed oral transit Oral - Mechanical Soft: Delayed oral transit Oral - Pill: Delayed oral transit (masticated pill)   Pharyngeal Phase Pharyngeal Phase Pharyngeal Phase: Impaired Pharyngeal - Honey Pharyngeal - Honey Teaspoon: Not tested Pharyngeal - Nectar Pharyngeal - Nectar Cup: Delayed swallow initiation;Premature  spillage to pyriform sinuses Penetration/Aspiration details (nectar cup): Material does not  enter airway Pharyngeal - Thin Pharyngeal - Thin Cup: Delayed swallow initiation;Premature  spillage to pyriform sinuses;Penetration/Aspiration during  swallow Penetration/Aspiration details (thin cup): Material enters  airway, CONTACTS cords and not ejected out Pharyngeal - Thin Straw: Delayed swallow initiation;Premature  spillage to pyriform sinuses;Penetration/Aspiration before  swallow;Penetration/Aspiration during swallow Penetration/Aspiration details (thin straw): Material enters  airway, passes BELOW cords without attempt by patient to eject  out (silent aspiration) Pharyngeal - Solids Pharyngeal - Puree: Delayed swallow initiation;Premature spillage  to valleculae;Pharyngeal residue - valleculae (trace residue) Penetration/Aspiration details (puree): Material does not enter  airway Pharyngeal - Mechanical Soft: Delayed  swallow  initiation;Premature spillage to valleculae Pharyngeal - Pill: Delayed swallow initiation;Premature spillage  to valleculae (masticated pill)  Cervical Esophageal Phase    GO   Gabriel Rainwater MA, CCC-SLP [phone]  Cervical Esophageal Phase Cervical Esophageal Phase: [facility]         McCoy Leah Meryl 03/07/2013, 4:28 PM    Dg Swallowing Func-speech Pathology  03/06/2013   Earle Gell McCoy, CCC-SLP     03/06/2013  12:38 PM Objective Swallowing Evaluation: Modified Barium Swallowing Study   Patient Details  Name: Katrina Meyer MRN: [id number] Date of Birth: 1932-12-19  Today's Date: 03/06/2013 Time: 1200-1220 SLP Time Calculation (min): 20 min  Past Medical History:  Past Medical History  Diagnosis Date  . Hyperlipidemia     takes Lipitor daily  . DVT (deep venous thrombosis)   . Stroke   . Parkinson's disease     takes Sinemet daily  . Tibia/fibula fracture 01/04/2012    Treated with long leg casting. Cast change in hospital 02/27/12    . Peripheral vascular disease 02/27/2012    Skin ulcer of right great toe  . Atrial fibrillation 03/09/2012    Seen on older EKG as well but family not aware.   . Macrocytic anemia 03/09/2012  . Lower GI bleed 03/10/2012    hematochezia secondary to ulcerated polyps; diverticulosis;  chronic diarrhea  . End stage renal disease on dialysis     [facility]- MWF  . Hypertension     takes Coreg daily  . Dementia     takes Aricept daily  . Diabetes mellitus, type II     takes Lantus and Novolog  . Seasonal allergies     takes Allegra daily  . Depression     takes Prozac daily  . Hypothyroidism     takes Synthroid daily   Past Surgical History:  Past Surgical History  Procedure Laterality Date  . Dg av dialysis graft declot or    . Av fistula repair  Aug. 2013    Left arm  . Cholecystectomy    . Colon surgery      Aprrox 2006 at [facility] after she had a colonoscopy  . Colonoscopy  03/10/2012    Procedure: COLONOSCOPY;  Surgeon: Rogene Houston, MD;   [facility];  Service: Endoscopy;  Laterality: N/A;  . Esophagogastroduodenoscopy  03/10/2012    Procedure: ESOPHAGOGASTRODUODENOSCOPY (EGD);  Surgeon: Rogene Houston, MD;  [facility];  Service: Endoscopy;   Laterality: N/A;  EGD if TCS normal.  . Ligation of arteriovenous  fistula Left 03/24/2012    Procedure: LIGATION OF ARTERIOVENOUS  FISTULA;  Surgeon: Conrad Bramwell, MD;  Location: [place];  Service: Vascular;  Laterality:  Left;  .  Av fistula placement Right 04/28/2012    Procedure: INSERTION OF ARTERIOVENOUS (AV) GORE-TEX GRAFT ARM;   Surgeon: Elam Dutch, MD;  [facility]: [facility];  Service:  Vascular;  Laterality: Right;  . Leg amputation above knee Left   . Angioplast of lle  12/2012-01/2013    Several Procedures  . Esophagogastroduodenoscopy N/A 02/14/2013    Procedure: ESOPHAGOGASTRODUODENOSCOPY (EGD);  Surgeon: Milus Banister, MD;  Location: [place];  Service: Endoscopy;   Laterality: N/A;  . Cardiac catheterization  03/08/2004    Normal coronary arteries and normal LV function. Normal  aortoiliac anatomy suitable for renal transplant.  . Transthoracic echocardiogram  02/07/2004    EF 60%, no evidence of significant valvular abnormalities.   HPI:  80 year old female with PMH of ESRD- HD- MWF, parkinsons, A. Fib,  HLD, HTN, dementia,  Gi bleed- Duodenitis/gastritis EGD-  02/14/2013 who went to dialysis on the day PTP became hypotensive,  lethargic and then unresponsive. Diagnosed with septic shock of  unknown origin. Intubated 1/27-1/30. Admitting CXR and head CT  negative for acute changes.      Assessment / Plan / Recommendation Clinical Impression  Dysphagia Diagnosis: Moderate oral phase dysphagia;Mild  pharyngeal phase dysphagia Clinical impression: MBS complete. Patient presents with a  moderate oral and a mild pharyngeal dysphagia with an exacerbated  aspiration risk based on AMS/exacerbated dementia. Oral phase  characterized by oral holding requiring max verbal, visual, and  tactile cueing to facilitate oral transit. Combination of delayed  swallow initiation, mild oral residuals, and mild vallecular  residuals post swallows result in trace but silent penetration of  all liquids consistencies tested with eventual silent aspiration.  Patient unable to follow clinician commands for hard cough in  attempts to clear. Aspiration risk high. Prognosis for  improvement good with improved mentation.     Treatment Recommendation  Therapy  as outlined in treatment plan below    Diet Recommendation NPO;Alternative means - temporary   Medication Administration: Via alternative means    Other  Recommendations Oral Care Recommendations: Oral care Q4  per protocol   Follow Up Recommendations       Frequency and Duration min 2x/week  2 weeks           General HPI: 80 year old female with PMH of ESRD- HD- MWF,  parkinsons, A. Fib, HLD, HTN, dementia,  Gi bleed-  Duodenitis/gastritis EGD- 02/14/2013 who went to dialysis on the  day PTP became hypotensive, lethargic and then unresponsive.  Diagnosed with septic shock of unknown origin. Intubated  1/27-1/30. Admitting CXR and head CT negative for acute changes.  Type of Study: Modified Barium Swallowing Study Reason for Referral: Objectively evaluate swallowing function Previous Swallow Assessment: n/a-prior to admission Diet Prior to this Study: NPO Temperature Spikes Noted: No Respiratory Status: Nasal cannula History of Recent Intubation: Yes Length of Intubations (days): 3 days Date extubated: 03/05/13 Behavior/Cognition: Alert;Requires cueing;Decreased sustained  attention;Confused Oral Cavity - Dentition: Poor condition;Missing dentition Oral Motor / Sensory Function: Within functional limits (although  unable to formally test due to AMS) Self-Feeding Abilities: Needs assist Patient Positioning: Upright in chair Baseline Vocal Quality: Clear Volitional Cough: Cognitively unable to elicit Volitional Swallow: Able to elicit Anatomy: Within functional limits Pharyngeal Secretions: Not observed secondary MBS    Reason for Referral Objectively evaluate swallowing function   Oral Phase Oral Preparation/Oral Phase Oral Phase: Impaired Oral - Honey Oral - Honey Teaspoon: Reduced posterior propulsion;Holding of  bolus;Lingual/palatal residue;Delayed oral transit Oral - Nectar Oral - Nectar Cup: Reduced posterior propulsion;Holding of  bolus;Lingual/palatal residue;Delayed oral transit Oral - Thin Oral - Thin Cup:  Reduced posterior propulsion;Holding of  bolus;Lingual/palatal residue;Delayed oral transit Oral - Solids Oral - Puree: Reduced posterior propulsion;Holding of  bolus;Lingual/palatal residue;Delayed oral transit   Pharyngeal Phase Pharyngeal Phase Pharyngeal Phase: Impaired Pharyngeal - Honey Pharyngeal - Honey Teaspoon: Delayed swallow initiation;Premature  spillage to valleculae;Pharyngeal residue - valleculae;Pharyngeal  residue - pyriform sinuses;Penetration/Aspiration after  swallow;Trace aspiration Penetration/Aspiration details (honey teaspoon): Material enters  airway, passes BELOW cords without attempt by patient to eject  out (silent aspiration);Material enters airway, CONTACTS cords  and not ejected out Pharyngeal - Nectar Pharyngeal - Nectar Cup: Delayed swallow initiation;Premature  spillage to valleculae;Pharyngeal residue - valleculae;Pharyngeal  residue - pyriform sinuses;Penetration/Aspiration during  swallow;Penetration/Aspiration after swallow;Trace aspiration Penetration/Aspiration details (nectar cup): Material enters  airway, passes BELOW cords without attempt by patient to eject  out (silent aspiration);Material enters airway, CONTACTS cords  and not ejected out Pharyngeal - Thin Pharyngeal - Thin Cup: Delayed swallow initiation;Pharyngeal  residue - valleculae;Pharyngeal residue - pyriform  sinuses;Penetration/Aspiration during  swallow;Penetration/Aspiration after swallow;Trace  aspiration;Premature spillage to pyriform sinuses Penetration/Aspiration details (thin cup): Material enters  airway, passes BELOW cords without attempt by patient to eject  out (silent aspiration);Material enters airway, CONTACTS cords  and not ejected out Pharyngeal - Solids Pharyngeal - Puree: Delayed swallow initiation;Premature spillage  to valleculae;Pharyngeal residue - valleculae;Pharyngeal residue  - pyriform sinuses;Penetration/Aspiration after swallow Penetration/Aspiration details (puree): Material enters  airway,  remains ABOVE vocal cords then ejected out  Cervical Esophageal Phase    GO  Ferdinand Lango MA, CCC-SLP [phone]   Cervical Esophageal Phase Cervical Esophageal Phase: [facility]         McCoy Leah Meryl 03/06/2013, 12:38 PM     Microbiology: Recent Results (from the past 240 hour(s))  CULTURE, BLOOD (ROUTINE X 2)     Status: None   Collection Time    03/02/13  1:35 PM      Result Value Range Status   Specimen Description BLOOD BRACHIAL ARTERY LEFT   Final   Special Requests BOTTLES DRAWN AEROBIC ONLY 10CC   Final   Culture  Setup Time     Final   Value: 03/02/2013 16:31     Performed at [facility]   Culture     Final   Value: NO GROWTH 5 DAYS     Performed at [facility]   Report Status 03/08/2013 FINAL   Final  CULTURE, BLOOD (ROUTINE X 2)     Status: None   Collection Time    03/02/13  1:45 PM      Result Value Range Status   Specimen Description BLOOD HAND LEFT   Final   Special Requests BOTTLES DRAWN AEROBIC ONLY 2CC   Final   Culture  Setup Time     Final   Value: 03/02/2013 16:17     Performed at [facility]   Culture     Final   Value: NO GROWTH 5 DAYS     Performed at [facility]   Report Status 03/08/2013 FINAL   Final  CULTURE, RESPIRATORY (NON-EXPECTORATED)     Status: None   Collection Time    03/02/13  3:49 PM      Result Value Range Status   Specimen Description TRACHEAL ASPIRATE   Final   Special Requests NONE   Final   Gram Stain     Final   Value: MODERATE WBC PRESENT, PREDOMINANTLY MONONUCLEAR     NO SQUAMOUS EPITHELIAL CELLS SEEN     RARE GRAM POSITIVE COCCI IN PAIRS     RARE GRAM POSITIVE RODS     Performed at [facility]   Culture     Final   Value: FEW CANDIDA ALBICANS     Performed at [facility]   Report Status 03/04/2013 FINAL   Final  CLOSTRIDIUM DIFFICILE BY PCR     Status: None   Collection Time    03/05/13  2:29 PM      Result Value Range Status   C difficile by pcr NEGATIVE   NEGATIVE Final     Labs: Basic Metabolic Panel:  Recent Labs Lab 03/03/13 0415  03/04/13 0255 03/04/13 0500 03/04/13 2345 03/05/13 0900 03/05/13 0935 03/05/13 2250 03/06/13 0400 03/07/13 1118 03/08/13 0500  NA 148* 144  --   --  137  --   --  139 140 143  K 4.1 4.2  --   --  3.1*  --   --  3.1* 3.7 3.3*  CL 112 108  --   --  96  --   --  97 97 99  CO2 19 18*  --   --  23  --   --  26 26 28   GLUCOSE 139* 163*  --   --  220*  --   --  95 125* 132*  BUN 27* 30*  --   --  14  --   --  17 10 13   CREATININE 3.61* 4.00*  --   --  2.11*  --   --  2.77* 2.26* 2.98*  CALCIUM 9.5 9.5  --   --  8.2*  --   --  8.4 8.6 8.5  MG 1.7  --  1.8 1.6  --  2.2 2.1  --   --   --   PHOS 1.6* 4.6 4.7* 1.9*  --  3.3 3.7  --   --   --    Liver Function Tests:  Recent Labs Lab 03/02/13 1645 03/04/13 0255  AST 21 15  ALT <5 <5  ALKPHOS 74 87  BILITOT 0.2* 0.2*  PROT 3.5* 4.5*  ALBUMIN 1.2* 1.6*   No results found for this basename: LIPASE, AMYLASE,  in the last 168 hours No results found for this basename: AMMONIA,  in the last 168 hours CBC:  Recent Labs Lab 03/02/13 1645 03/03/13 0415 03/04/13 0255 03/05/13 0310 03/06/13 0400  WBC 14.0* 11.9* 11.7* 10.6* 8.9  NEUTROABS 11.1*  --   --   --   --   HGB 10.7* 10.4* 9.4* 9.9* 9.6*  HCT 34.1* 33.0* 29.6* 31.3* 29.9*  MCV 98.6 97.1 97.7 97.8 97.1  PLT 326 325 276 269 257   Cardiac Enzymes: No results found for this basename: CKTOTAL, CKMB, CKMBINDEX, TROPONINI,  in the last 168 hours BNP: BNP (last 3 results)  Recent Labs  03/02/13 1645  PROBNP 13840.0*   CBG:  Recent Labs Lab 03/08/13 1602 03/09/13 0124 03/09/13 0804 03/09/13 1151 03/09/13 1228  GLUCAP 163* 91 115* 209* 224*       Signed:  VANN, JESSICA  [facility] 03/09/2013, 4:00 PM

## 2013-03-24 ENCOUNTER — Ambulatory Visit: Payer: Medicare Other | Admitting: Cardiovascular Disease

## 2013-04-01 ENCOUNTER — Emergency Department (HOSPITAL_COMMUNITY)
Admission: EM | Admit: 2013-04-01 | Discharge: 2013-04-01 | Disposition: A | Discharge status: Home / Self Care | Payer: Medicare Other | Attending: Emergency Medicine | Admitting: Emergency Medicine

## 2013-04-01 ENCOUNTER — Encounter (HOSPITAL_COMMUNITY): Payer: Self-pay | Admitting: Emergency Medicine

## 2013-04-01 DIAGNOSIS — I4891 Unspecified atrial fibrillation: Secondary | ICD-10-CM | POA: Insufficient documentation

## 2013-04-01 DIAGNOSIS — Z992 Dependence on renal dialysis: Secondary | ICD-10-CM | POA: Insufficient documentation

## 2013-04-01 DIAGNOSIS — F0280 Dementia in other diseases classified elsewhere without behavioral disturbance: Secondary | ICD-10-CM | POA: Insufficient documentation

## 2013-04-01 DIAGNOSIS — Z872 Personal history of diseases of the skin and subcutaneous tissue: Secondary | ICD-10-CM | POA: Insufficient documentation

## 2013-04-01 DIAGNOSIS — E039 Hypothyroidism, unspecified: Secondary | ICD-10-CM | POA: Insufficient documentation

## 2013-04-01 DIAGNOSIS — Z79899 Other long term (current) drug therapy: Secondary | ICD-10-CM | POA: Insufficient documentation

## 2013-04-01 DIAGNOSIS — Z8719 Personal history of other diseases of the digestive system: Secondary | ICD-10-CM | POA: Insufficient documentation

## 2013-04-01 DIAGNOSIS — F329 Major depressive disorder, single episode, unspecified: Secondary | ICD-10-CM | POA: Insufficient documentation

## 2013-04-01 DIAGNOSIS — I120 Hypertensive chronic kidney disease with stage 5 chronic kidney disease or end stage renal disease: Secondary | ICD-10-CM | POA: Insufficient documentation

## 2013-04-01 DIAGNOSIS — F3289 Other specified depressive episodes: Secondary | ICD-10-CM | POA: Insufficient documentation

## 2013-04-01 DIAGNOSIS — E785 Hyperlipidemia, unspecified: Secondary | ICD-10-CM | POA: Insufficient documentation

## 2013-04-01 DIAGNOSIS — N186 End stage renal disease: Secondary | ICD-10-CM | POA: Insufficient documentation

## 2013-04-01 DIAGNOSIS — Z862 Personal history of diseases of the blood and blood-forming organs and certain disorders involving the immune mechanism: Secondary | ICD-10-CM | POA: Insufficient documentation

## 2013-04-01 DIAGNOSIS — E876 Hypokalemia: Secondary | ICD-10-CM | POA: Insufficient documentation

## 2013-04-01 DIAGNOSIS — G20A1 Parkinson's disease without dyskinesia, without mention of fluctuations: Secondary | ICD-10-CM | POA: Insufficient documentation

## 2013-04-01 DIAGNOSIS — Z794 Long term (current) use of insulin: Secondary | ICD-10-CM | POA: Insufficient documentation

## 2013-04-01 DIAGNOSIS — Z8673 Personal history of transient ischemic attack (TIA), and cerebral infarction without residual deficits: Secondary | ICD-10-CM | POA: Insufficient documentation

## 2013-04-01 DIAGNOSIS — G20 Parkinson's disease: Secondary | ICD-10-CM | POA: Insufficient documentation

## 2013-04-01 DIAGNOSIS — Z86718 Personal history of other venous thrombosis and embolism: Secondary | ICD-10-CM | POA: Insufficient documentation

## 2013-04-01 DIAGNOSIS — E119 Type 2 diabetes mellitus without complications: Secondary | ICD-10-CM | POA: Insufficient documentation

## 2013-04-01 DIAGNOSIS — Z8781 Personal history of (healed) traumatic fracture: Secondary | ICD-10-CM | POA: Insufficient documentation

## 2013-04-01 LAB — CBC WITH DIFFERENTIAL/PLATELET
BASOS PCT: 0 % (ref 0–1)
Basophils Absolute: 0 10*3/uL (ref 0.0–0.1)
Eosinophils Absolute: 0.1 10*3/uL (ref 0.0–0.7)
Eosinophils Relative: 1 % (ref 0–5)
HCT: 37.8 % (ref 36.0–46.0)
HEMOGLOBIN: 11.5 g/dL — AB (ref 12.0–15.0)
Lymphocytes Relative: 16 % (ref 12–46)
Lymphs Abs: 1.1 10*3/uL (ref 0.7–4.0)
MCH: 32.1 pg (ref 26.0–34.0)
MCHC: 30.4 g/dL (ref 30.0–36.0)
MCV: 105.6 fL — ABNORMAL HIGH (ref 78.0–100.0)
MONOS PCT: 13 % — AB (ref 3–12)
Monocytes Absolute: 0.9 10*3/uL (ref 0.1–1.0)
Neutro Abs: 4.9 10*3/uL (ref 1.7–7.7)
Neutrophils Relative %: 70 % (ref 43–77)
Platelets: 286 10*3/uL (ref 150–400)
RBC: 3.58 MIL/uL — AB (ref 3.87–5.11)
RDW: 20.9 % — ABNORMAL HIGH (ref 11.5–15.5)
WBC: 7 10*3/uL (ref 4.0–10.5)

## 2013-04-01 LAB — BASIC METABOLIC PANEL
BUN: 12 mg/dL (ref 6–23)
CO2: 33 mEq/L — ABNORMAL HIGH (ref 19–32)
Calcium: 8.2 mg/dL — ABNORMAL LOW (ref 8.4–10.5)
Chloride: 103 mEq/L (ref 96–112)
Creatinine, Ser: 3.35 mg/dL — ABNORMAL HIGH (ref 0.50–1.10)
GFR calc Af Amer: 14 mL/min — ABNORMAL LOW (ref >90)
GFR, EST NON AFRICAN AMERICAN: 12 mL/min — AB (ref >90)
Glucose, Bld: 171 mg/dL — ABNORMAL HIGH (ref 70–99)
Potassium: 3.1 mEq/L — ABNORMAL LOW (ref 3.7–5.3)
Sodium: 149 mEq/L — ABNORMAL HIGH (ref 137–147)

## 2013-04-01 LAB — TROPONIN I: Troponin I: <0.3 ng/mL (ref <0.30)

## 2013-04-01 MED ORDER — POTASSIUM CHLORIDE 20 MEQ PO PACK
20.0000 meq | PACK | Freq: Once | ORAL | Status: AC
Start: 2013-04-01 — End: 2013-04-01
  Administered 2013-04-01: 20 meq via ORAL
  Filled 2013-04-01: qty 1

## 2013-04-01 MED ORDER — SODIUM CHLORIDE 0.9 % IV BOLUS (SEPSIS)
500.0000 mL | Freq: Once | INTRAVENOUS | Status: AC
  Administered 2013-04-01: 500 mL via INTRAVENOUS

## 2013-04-01 NOTE — ED Provider Notes (Signed)
CSN: [id number]     Arrival date & time 04/01/13  1552 History  First MD Initiated Contact with Patient 04/01/13 1600   This chart was scribed for Katrina Christen, MD by Terressa Koyanagi, ED Scribe. This patient was seen in room APA09/APA09 and the patient's care was started at 4:29 PM.    Chief Complaint  Patient presents with  . Hypotension   The history is provided by a caregiver, the EMS personnel and the patient. No language interpreter was used.   HPI Comments: Level V caveat for dementia. Katrina Meyer is a 80 y.o. female who presents to the Emergency Department complaining of altered mental status. Patient has multiple health problems well-documented including end-stage renal disease on dialysis Tuesday Thursday and Saturday. Caregiver reports more obtundation the past 12 hours. No other obvious abnormalities. She states the patient has perked up recently. She does not urinate.  Blood pressure low earlier today.   Both grandson and caregiver reported that patient is now more like her normal self   Past Medical History  Diagnosis Date  . Hyperlipidemia     takes Lipitor daily  . DVT (deep venous thrombosis)   . Stroke   . Parkinson's disease     takes Sinemet daily  . Tibia/fibula fracture 01/04/2012    Treated with long leg casting. Cast change in hospital 02/27/12   . Peripheral vascular disease 02/27/2012    Skin ulcer of right great toe  . Atrial fibrillation 03/09/2012    Seen on older EKG as well but family not aware.   . Macrocytic anemia 03/09/2012  . Lower GI bleed 03/10/2012    hematochezia secondary to ulcerated polyps; diverticulosis; chronic diarrhea  . End stage renal disease on dialysis     [facility]- MWF  . Hypertension     takes Coreg daily  . Dementia     takes Aricept daily  . Diabetes mellitus, type II     takes Lantus and Novolog  . Seasonal allergies     takes Allegra daily  . Depression     takes Prozac daily  . Hypothyroidism     takes Synthroid  daily   Past Surgical History  Procedure Laterality Date  . Dg av dialysis graft declot or    . Av fistula repair  Aug. 2013    Left arm  . Cholecystectomy    . Colon surgery      Aprrox 2006 at [facility] after she had a colonoscopy  . Colonoscopy  03/10/2012    Procedure: COLONOSCOPY;  Surgeon: Rogene Houston, MD;  Location: [facility];  Service: Endoscopy;  Laterality: N/A;  . Esophagogastroduodenoscopy  03/10/2012    Procedure: ESOPHAGOGASTRODUODENOSCOPY (EGD);  Surgeon: Rogene Houston, MD;  Location: [facility];  Service: Endoscopy;  Laterality: N/A;  EGD if TCS normal.  . Ligation of arteriovenous  fistula Left 03/24/2012    Procedure: LIGATION OF ARTERIOVENOUS  FISTULA;  Surgeon: Conrad Roaring Spring, MD;  Location: [place];  Service: Vascular;  Laterality: Left;  . Av fistula placement Right 04/28/2012    Procedure: INSERTION OF ARTERIOVENOUS (AV) GORE-TEX GRAFT ARM;  Surgeon: Elam Dutch, MD;  Location: [facility] OR;  Service: Vascular;  Laterality: Right;  . Leg amputation above knee Left   . Angioplast of lle  12/2012-01/2013    Several Procedures  . Esophagogastroduodenoscopy N/A 02/14/2013    Procedure: ESOPHAGOGASTRODUODENOSCOPY (EGD);  Surgeon: Milus Banister, MD;  Location: [place];  Service: Endoscopy;  Laterality: N/A;  . Cardiac catheterization  03/08/2004    Normal coronary arteries and normal LV function. Normal aortoiliac anatomy suitable for renal transplant.  . Transthoracic echocardiogram  02/07/2004    EF 60%, no evidence of significant valvular abnormalities.   Family History  Problem Relation Age of Onset  . Stroke Mother   . Diabetes Mother   . Cancer Mother   . Hypertension Mother   . Coronary artery disease Father   . Diabetes Sister    History  Substance Use Topics  . Smoking status: Passive Smoke Exposure - Never Smoker    Types: Cigarettes  . Smokeless tobacco: Never Used  . Alcohol Use: No   OB History   Grav Para Term Preterm Abortions TAB SAB Ect  Mult Living                 Review of Systems  Unable to perform ROS All other systems reviewed and are negative.   Allergies  Hydralazine and Hydrochlorothiazide  Home Medications   Current Outpatient Rx  Name  Route  Sig  Dispense  Refill  . atorvastatin (LIPITOR) 10 MG tablet   Oral   Take 10 mg by mouth every morning.          . carbidopa-levodopa (SINEMET IR) 25-100 MG per tablet   Oral   Take 1.5 tablets by mouth 3 (three) times daily.         . carvedilol (COREG) 6.25 MG tablet   Oral   Take 3 tablets (18.75 mg total) by mouth 2 (two) times daily with a meal.   90 tablet   0   . cinacalcet (SENSIPAR) 30 MG tablet   Oral   Take 30 mg by mouth every evening.          . donepezil (ARICEPT) 10 MG tablet   Oral   Take 10 mg by mouth at bedtime.          Marland Kitchen FLUoxetine (PROZAC) 40 MG capsule   Oral   Take 40 mg by mouth every morning.          Marland Kitchen levothyroxine (SYNTHROID, LEVOTHROID) 25 MCG tablet   Oral   Take 25 mcg by mouth every morning.         . Maltodextrin-Xanthan Gum (RESOURCE THICKENUP CLEAR) POWD      To thicken liquids to nectar thick   1 Can   2   . multivitamin (RENA-VIT) TABS tablet   Oral   Take 1 tablet by mouth 2 (two) times daily.          . Nutritional Supplements (FEEDING SUPPLEMENT, NEPRO CARB STEADY,) LIQD   Oral   Take 237 mLs by mouth daily.   1 Can   0     1 case please   . insulin glargine (LANTUS) 100 UNIT/ML injection   Subcutaneous   Inject 1-5 Units into the skin at bedtime as needed. Per Sliding Scale          BP 109/56  Pulse 106  Temp(Src) 98 F (36.7 C) (Oral)  Resp 16  Wt 135 lb (61.236 kg)  SpO2 96% Physical Exam  Nursing note and vitals reviewed. Constitutional: No distress.  Demented, does not answer questions.  HENT:  Head: Normocephalic and atraumatic.  Eyes: Conjunctivae and EOM are normal. Pupils are equal, round, and reactive to light.  Neck: Normal range of motion. Neck  supple. No tracheal deviation present.  Cardiovascular: Normal rate, regular rhythm and normal  heart sounds.   Pulmonary/Chest: Effort normal and breath sounds normal. No respiratory distress.  Abdominal: Soft. Bowel sounds are normal.  Musculoskeletal: Normal range of motion.  Neurological:  demented  Skin: Skin is warm and dry.  Psychiatric:  unable    ED Course  Procedures (including critical care time)  DIAGNOSTIC STUDIES: Oxygen Saturation is 96% on room air, normal by my interpretation.    COORDINATION OF CARE:  4:29 PM-Discussed treatment plan which includes  with pt at bedside and pt agreed to plan.      Labs Review Labs Reviewed  BASIC METABOLIC PANEL - Abnormal; Notable for the following:    Sodium 149 (*)    Potassium 3.1 (*)    CO2 33 (*)    Glucose, Bld 171 (*)    Creatinine, Ser 3.35 (*)    Calcium 8.2 (*)    GFR calc non Af Amer 12 (*)    GFR calc Af Amer 14 (*)    All other components within normal limits  CBC WITH DIFFERENTIAL - Abnormal; Notable for the following:    RBC 3.58 (*)    Hemoglobin 11.5 (*)    MCV 105.6 (*)    RDW 20.9 (*)    Monocytes Relative 13 (*)    All other components within normal limits  TROPONIN I   Imaging Review No results found.   EKG Interpretation  Date/Time:  Thursday April 01 2013 16:00:41 EST Ventricular Rate:  76 PR Interval:  192 QRS Duration: 90 QT Interval:  444 QTC Calculation: 499 R Axis:   -6 Text Interpretation:  Sinus rhythm with Premature ventricular complexes or Fusion complexes Nonspecific T wave abnormality Prolonged QT Abnormal ECG When compared with ECG of 02-Mar-2013 12:55, PREVIOUS ECG IS PRESENT Confirmed by Chermaine Schnyder  MD, Nara Paternoster ([id number]) on 04/01/2013 4:47:39 PM       MDM   Final diagnoses:  End stage renal disease  Hypokalemia   Slight hypokalemia noted. Oral potassium replacement given.   Family says she is more like her normal self. She has primary care follow up  I personally  performed the services described in this documentation, which was scribed in my presence. The recorded information has been reviewed and is accurate.     Katrina Christen, MD 04/02/13 [phone]

## 2013-04-01 NOTE — ED Notes (Signed)
ems reports cbg 259, bp increased 140/66, hr 68, 02 sat 100% on room air.

## 2013-04-01 NOTE — Discharge Instructions (Signed)
Make sure you go to dialysis tomorrow.   Resource guide given.    Emergency Department Resource Guide 1) Find a Doctor and Pay Out of Pocket Although you won't have to find out who is covered by your insurance plan, it is a good idea to ask around and get recommendations. You will then need to call the office and see if the doctor you have chosen will accept you as a new patient and what types of options they offer for patients who are self-pay. Some doctors offer discounts or will set up payment plans for their patients who do not have insurance, but you will need to ask so you aren't surprised when you get to your appointment.  2) Contact Your Local Health Department Not all health departments have doctors that can see patients for sick visits, but many do, so it is worth a call to see if yours does. If you don't know where your local health department is, you can check in your phone book. The CDC also has a tool to help you locate your state's health department, and many state websites also have listings of all of their local health departments.  3) Find a [facility] If your illness is not likely to be very severe or complicated, you may want to try a walk in clinic. These are popping up all over the country in pharmacies, drugstores, and shopping centers. They're usually staffed by nurse practitioners or physician assistants that have been trained to treat common illnesses and complaints. They're usually fairly quick and inexpensive. However, if you have serious medical issues or chronic medical problems, these are probably not your best option.  No Primary Care Doctor: - Call Health Connect at  [phone] - they can help you locate a primary care doctor that  accepts your insurance, provides certain services, etc. - Physician Referral Service- [phone]  Chronic Pain Problems: Organization         Address  Phone   Notes  [facility]  [phone] Patients need to  be referred by their primary care doctor.   Medication Assistance: Organization         Address  Phone   Notes  [facility] Med[facility] [address] [place], [place] [address] [phone] --Must be a resident of [facility] -- Must have NO insurance coverage whatsoever (no Medicaid/ Medicare, etc.) -- The pt. MUST have a primary care doctor that directs their care regularly and follows them in the community   MedAssist  [phone]   [employer]  [phone]    Agencies that provide inexpensive medical care: Organization         Address  Phone   Notes  [facility]  [phone]   [facility]    [phone]   [facility] [place], [place] [address] [phone]   [place] [address] [place] [phone]   Planned Parenthood    [phone]   [facility]    [phone]   [place] and [place] [address], [place] Phone:  [phone], Fax:  [phone] Hours of Operation:  9 am - 6 pm, M-F.  Also accepts Medicaid/Medicare and self-pay.  [facility] for [place] [address], [address], [place] Phone: [phone], Fax: [phone]. Hours of Operation:  8:30 am -  5:30 pm, M-F.  Also accepts Medicaid and self-pay.  [facility] [place] [address] [place] Phone: [phone]   [place], [place], [place] [phone], Ext. [phone] Mondays & Thursdays: 7-9 AM.  First 15 patients are seen on a first come, first serve basis.    [place] Providers:  Organization         Address  Phone   Notes  [facility] [address], [place] [phone] Also accepts self-pay patients.  [facility] [id number] [place], Center  [phone]   [place], [address], [place] [phone]   [facility] [address], [place] [phone]   Lucianne Lei [address], [place]   [phone] Only accepts [place] Access [place] patients after they have their name applied to their card.   Self-Pay (no insurance) in [facility]:  Organization         Address  Phone   Notes  Sickle Cell Patients, [facility] [place] [phone]   [facility] Urgent Care [place] [phone]   [facility] [place]  [place], [place], [place] [phone]   [facility]/Dr. Osei-Bonsu  [address] [place] or [address], [place] [phone] Phone number for both [place] and [place] locations is the same.  Urgent Medical and [facility] [address], [place] [phone]   [facility] [address] [place] or [address] [phone]   [facility] [address], [place] [phone], phone; [phone], fax Sees patients 1st and 3rd Saturday of every month.  Must not qualify for public or private insurance (i.e. Medicaid, Medicare, [place] Health Choice, Veterans' Benefits)  Household income should be no more than 200% of the poverty level The clinic cannot treat you if you are pregnant or think you are pregnant  Sexually transmitted diseases are not treated at the clinic.    Dental Care: Organization         Address  Phone  Notes  [facility] Department of [facility] [place] [phone] Accepts children up to age 25 who are enrolled in [place] or [place]; pregnant women with a Medicaid card; and children who have applied for Medicaid or [place] Health Choice, but were declined, whose parents can  pay a reduced fee at time of service.  [facility] Department of [facility]  [address], [place] [phone] Accepts children up to age 62 who are enrolled in [place] or [place]; pregnant women with a Medicaid card; and children who have applied for Medicaid or [place] Health Choice, but were declined, whose parents can pay a reduced fee at time of service.  [facility]  [place] [phone] Patients are seen by appointment only. Walk-ins are not accepted. [place] will see patients 33 years of age and older. Monday - Tuesday (8am-5pm) Most Wednesdays (8:30-5pm) $30 per visit, cash only  [facility] Adult Dental Access PROGRAM  [address], [facility] [phone] Patients are seen by appointment only. Walk-ins are  not accepted. [place] will see patients 35 years of age and older. One Wednesday Evening (Monthly: Volunteer Based).  $30 per visit, cash only  [place]  [phone] for adults; Children under age 55, call Graduate Pediatric Dentistry at [phone]. Children aged 98-14, please call [phone] to request a pediatric application.  Dental services are provided in all areas of dental care including fillings, crowns and bridges, complete and partial dentures, implants, gum treatment, root canals, and extractions. Preventive care is also provided. Treatment is provided to both adults and children. Patients are selected via a lottery and there is often a waiting list.   [facility] [address], [place]  [phone] [email or web address]   [facility] [address] [place], [place] [phone], Ext. [phone] Second and Fourth Thursday of each month, opens at 6:30 AM; Clinic ends at 9 AM.  Patients are seen on a first-come first-served basis, and a limited number are seen during each clinic.   [facility]  [address] Hillard Danker [place], [place] [phone]   Eligibility Requirements You must have lived in [place], [place], or [place] counties for at least the last three months.   You cannot be eligible for state or federal sponsored [employer], including [employer], [place], or [employer].   You generally cannot be eligible for healthcare insurance through your employer.    How to apply: Eligibility screenings are held every Tuesday and Wednesday afternoon from 1:00 pm until 4:00 pm. You do not need an appointment for the interview!  [facility] [address], [place], [place]   [place]  [place] Department  [place]  [phone]    Behavioral Health Resources in the Community: Intensive Outpatient Programs Organization         Address  Phone  Notes  [place]. [address], [place], [place] [phone]   [facility] [address] [place], [place]   ADS: [facility] Drug S[facility] [address], [place], [place]   [place] [address]  [place], [place] or [phone]   Substance Abuse Resources Organization         Address  Phone  Notes  Alcohol and Drug Services  [phone]   [place]  [phone]   The [place]   Chinita Pester  [phone]   Residential & Outpatient Substance Abuse Program  [phone]   Psychological Services Organization         Address  Phone  Notes  [facility] [place]  [place]  [phone]   [place] [address], [place] or [phone]    Mobile Crisis Teams Organization         Address  Phone  Notes  [facility], Mobile Crisis Care Unit  [phone]    [facility] Psychotherapeutic Services  [address] [place], [place]   [place] [address] [place] [phone]    Self-Help/Support Groups Organization         Address  Phone             Notes  [place]. of [place] - variety of support groups  [place] Call for more information  Narcotics Anonymous (NA),  [facility] [address], [employer] [place]  2 meetings at this location   Residential Facilities manager         Address  Phone  Notes  [facility] [place],    [place]  [phone]   [facility]  [address] [place] [address], [place], [place]   [place], [place] [phone] Admissions: 8am-3pm M-F  Incentives Substance [place] [address]    [place], [place] [phone]   [facility] [address] [place], [place], [place]   [facility] [address],  [place], [place]   [facility] - Intensive Outpatient [address], [place], [place]   [facility]Outlook.) [place].,  [place], [place] [phone] or [phone]   [facility]idential Treatment Services (RTS) [address], [place], [place] Accepts Medicaid  [facility] [place] [address],  [place] [phone] Substance Abuse/Addiction Treatment   [facility] Organization         Address  Phone  Notes  [facility]  [phone]   Domenic Schwab, PhD [address] Arlis [place], [place]   [phone] or [phone]   [place], [place] [phone]   [facility] [address] [place], [place] [phone] Insurance/Medicaid/sponsorship through [facility] and Families [address]                                     [place], [place] [phone] [place] [address], [place] [phone]    Dr. Adele Schilder  [phone]   [facility] Dept. 1) [address], [place] 2) [place] 3)  [place] [address], [place] [phone]  [phone]   [place] [phone] or [phone] (After Hours)

## 2013-04-01 NOTE — ED Notes (Signed)
EMS reports a nurses aid  Called ems  because bp today  Was  85/39.  Reports had dialysis yesterday and had difficulty clamping site after dialysis finished.   Reports pt ate supper good last night and was ok this morning.  EMS adminsitered 250-327ml of nss bolus.  ems reports pt was not responding well upon their arrival and was nonverbal.  At this time pt answering some questions appropriately.

## 2013-04-01 NOTE — ED Notes (Signed)
Caregiver at bedside and reports that yesterday pt's dialysis clamp had came off and there was blood "everywhere."  Reports pt only ate 2 bites of supper then went to bed.  Reports around noon pt ate 1 or 2 bites of oatmeal for  then stopped and slumped over in the chair.  Caregiver says pt has had these episodes in the past.  Caregiver took vs and bp was 72/53 and HR 105.  EMS called pt's Home health RN and then called ems.

## 2013-04-01 NOTE — ED Notes (Signed)
Unable to get urine.  Pt anuric.

## 2013-04-05 ENCOUNTER — Ambulatory Visit: Payer: Medicare Other | Admitting: Cardiovascular Disease

## 2013-04-27 ENCOUNTER — Ambulatory Visit: Payer: Medicare Other | Admitting: Vascular Surgery

## 2013-05-03 ENCOUNTER — Encounter: Payer: Self-pay | Admitting: Vascular Surgery

## 2013-05-04 ENCOUNTER — Ambulatory Visit: Payer: Medicare Other | Admitting: Vascular Surgery

## 2013-05-05 DIAGNOSIS — I739 Peripheral vascular disease, unspecified: Secondary | ICD-10-CM

## 2013-05-05 HISTORY — DX: Peripheral vascular disease, unspecified: I73.9

## 2013-05-11 ENCOUNTER — Telehealth: Payer: Self-pay | Admitting: Cardiovascular Disease

## 2013-05-27 NOTE — Telephone Encounter (Signed)
Closed encounter °

## 2013-05-31 ENCOUNTER — Emergency Department (HOSPITAL_COMMUNITY): Payer: Medicare Other

## 2013-05-31 ENCOUNTER — Encounter (HOSPITAL_COMMUNITY): Payer: Self-pay | Admitting: Emergency Medicine

## 2013-05-31 ENCOUNTER — Inpatient Hospital Stay (HOSPITAL_COMMUNITY)
Admission: EM | Admit: 2013-05-31 | Discharge: 2013-06-18 | DRG: 637 | Disposition: A | Discharge status: Home / Self Care | Payer: Medicare Other | Attending: Internal Medicine | Admitting: Internal Medicine

## 2013-05-31 DIAGNOSIS — F329 Major depressive disorder, single episode, unspecified: Secondary | ICD-10-CM | POA: Diagnosis present

## 2013-05-31 DIAGNOSIS — M9080 Osteopathy in diseases classified elsewhere, unspecified site: Secondary | ICD-10-CM | POA: Diagnosis present

## 2013-05-31 DIAGNOSIS — F17200 Nicotine dependence, unspecified, uncomplicated: Secondary | ICD-10-CM | POA: Diagnosis present

## 2013-05-31 DIAGNOSIS — L89109 Pressure ulcer of unspecified part of back, unspecified stage: Secondary | ICD-10-CM | POA: Diagnosis present

## 2013-05-31 DIAGNOSIS — E1169 Type 2 diabetes mellitus with other specified complication: Principal | ICD-10-CM | POA: Diagnosis present

## 2013-05-31 DIAGNOSIS — E039 Hypothyroidism, unspecified: Secondary | ICD-10-CM | POA: Diagnosis present

## 2013-05-31 DIAGNOSIS — S78119A Complete traumatic amputation at level between unspecified hip and knee, initial encounter: Secondary | ICD-10-CM

## 2013-05-31 DIAGNOSIS — G20A1 Parkinson's disease without dyskinesia, without mention of fluctuations: Secondary | ICD-10-CM | POA: Diagnosis present

## 2013-05-31 DIAGNOSIS — E43 Unspecified severe protein-calorie malnutrition: Secondary | ICD-10-CM | POA: Diagnosis present

## 2013-05-31 DIAGNOSIS — K2990 Gastroduodenitis, unspecified, without bleeding: Secondary | ICD-10-CM

## 2013-05-31 DIAGNOSIS — R778 Other specified abnormalities of plasma proteins: Secondary | ICD-10-CM

## 2013-05-31 DIAGNOSIS — F3289 Other specified depressive episodes: Secondary | ICD-10-CM | POA: Diagnosis present

## 2013-05-31 DIAGNOSIS — R6510 Systemic inflammatory response syndrome (SIRS) of non-infectious origin without acute organ dysfunction: Secondary | ICD-10-CM

## 2013-05-31 DIAGNOSIS — E876 Hypokalemia: Secondary | ICD-10-CM | POA: Diagnosis not present

## 2013-05-31 DIAGNOSIS — E119 Type 2 diabetes mellitus without complications: Secondary | ICD-10-CM

## 2013-05-31 DIAGNOSIS — Z823 Family history of stroke: Secondary | ICD-10-CM

## 2013-05-31 DIAGNOSIS — I739 Peripheral vascular disease, unspecified: Secondary | ICD-10-CM | POA: Diagnosis present

## 2013-05-31 DIAGNOSIS — R5381 Other malaise: Secondary | ICD-10-CM | POA: Diagnosis present

## 2013-05-31 DIAGNOSIS — F0150 Vascular dementia without behavioral disturbance: Secondary | ICD-10-CM | POA: Diagnosis present

## 2013-05-31 DIAGNOSIS — I4891 Unspecified atrial fibrillation: Secondary | ICD-10-CM | POA: Diagnosis present

## 2013-05-31 DIAGNOSIS — A419 Sepsis, unspecified organism: Secondary | ICD-10-CM

## 2013-05-31 DIAGNOSIS — R4182 Altered mental status, unspecified: Secondary | ICD-10-CM

## 2013-05-31 DIAGNOSIS — L8991 Pressure ulcer of unspecified site, stage 1: Secondary | ICD-10-CM | POA: Diagnosis present

## 2013-05-31 DIAGNOSIS — Z992 Dependence on renal dialysis: Secondary | ICD-10-CM

## 2013-05-31 DIAGNOSIS — Z794 Long term (current) use of insulin: Secondary | ICD-10-CM

## 2013-05-31 DIAGNOSIS — I5022 Chronic systolic (congestive) heart failure: Secondary | ICD-10-CM

## 2013-05-31 DIAGNOSIS — R197 Diarrhea, unspecified: Secondary | ICD-10-CM | POA: Diagnosis not present

## 2013-05-31 DIAGNOSIS — Z515 Encounter for palliative care: Secondary | ICD-10-CM

## 2013-05-31 DIAGNOSIS — L89152 Pressure ulcer of sacral region, stage 2: Secondary | ICD-10-CM

## 2013-05-31 DIAGNOSIS — I96 Gangrene, not elsewhere classified: Secondary | ICD-10-CM | POA: Diagnosis present

## 2013-05-31 DIAGNOSIS — Z8673 Personal history of transient ischemic attack (TIA), and cerebral infarction without residual deficits: Secondary | ICD-10-CM

## 2013-05-31 DIAGNOSIS — Z7902 Long term (current) use of antithrombotics/antiplatelets: Secondary | ICD-10-CM

## 2013-05-31 DIAGNOSIS — L02612 Cutaneous abscess of left foot: Secondary | ICD-10-CM | POA: Diagnosis present

## 2013-05-31 DIAGNOSIS — D72829 Elevated white blood cell count, unspecified: Secondary | ICD-10-CM | POA: Diagnosis present

## 2013-05-31 DIAGNOSIS — K922 Gastrointestinal hemorrhage, unspecified: Secondary | ICD-10-CM

## 2013-05-31 DIAGNOSIS — R509 Fever, unspecified: Secondary | ICD-10-CM

## 2013-05-31 DIAGNOSIS — L8990 Pressure ulcer of unspecified site, unspecified stage: Secondary | ICD-10-CM | POA: Diagnosis present

## 2013-05-31 DIAGNOSIS — I959 Hypotension, unspecified: Secondary | ICD-10-CM

## 2013-05-31 DIAGNOSIS — R6520 Severe sepsis without septic shock: Secondary | ICD-10-CM

## 2013-05-31 DIAGNOSIS — L97509 Non-pressure chronic ulcer of other part of unspecified foot with unspecified severity: Secondary | ICD-10-CM | POA: Diagnosis present

## 2013-05-31 DIAGNOSIS — E1159 Type 2 diabetes mellitus with other circulatory complications: Secondary | ICD-10-CM | POA: Diagnosis present

## 2013-05-31 DIAGNOSIS — G9340 Encephalopathy, unspecified: Secondary | ICD-10-CM | POA: Diagnosis present

## 2013-05-31 DIAGNOSIS — F0390 Unspecified dementia without behavioral disturbance: Secondary | ICD-10-CM | POA: Diagnosis present

## 2013-05-31 DIAGNOSIS — I120 Hypertensive chronic kidney disease with stage 5 chronic kidney disease or end stage renal disease: Secondary | ICD-10-CM | POA: Diagnosis present

## 2013-05-31 DIAGNOSIS — R7989 Other specified abnormal findings of blood chemistry: Secondary | ICD-10-CM | POA: Diagnosis present

## 2013-05-31 DIAGNOSIS — D631 Anemia in chronic kidney disease: Secondary | ICD-10-CM

## 2013-05-31 DIAGNOSIS — K449 Diaphragmatic hernia without obstruction or gangrene: Secondary | ICD-10-CM

## 2013-05-31 DIAGNOSIS — I10 Essential (primary) hypertension: Secondary | ICD-10-CM | POA: Diagnosis present

## 2013-05-31 DIAGNOSIS — M869 Osteomyelitis, unspecified: Secondary | ICD-10-CM | POA: Diagnosis present

## 2013-05-31 DIAGNOSIS — G9341 Metabolic encephalopathy: Secondary | ICD-10-CM | POA: Diagnosis present

## 2013-05-31 DIAGNOSIS — N186 End stage renal disease: Secondary | ICD-10-CM | POA: Diagnosis present

## 2013-05-31 DIAGNOSIS — Z8249 Family history of ischemic heart disease and other diseases of the circulatory system: Secondary | ICD-10-CM

## 2013-05-31 DIAGNOSIS — G20 Parkinson's disease: Secondary | ICD-10-CM | POA: Diagnosis present

## 2013-05-31 DIAGNOSIS — N2581 Secondary hyperparathyroidism of renal origin: Secondary | ICD-10-CM | POA: Diagnosis present

## 2013-05-31 DIAGNOSIS — Y841 Kidney dialysis as the cause of abnormal reaction of the patient, or of later complication, without mention of misadventure at the time of the procedure: Secondary | ICD-10-CM | POA: Diagnosis present

## 2013-05-31 DIAGNOSIS — E785 Hyperlipidemia, unspecified: Secondary | ICD-10-CM

## 2013-05-31 DIAGNOSIS — K921 Melena: Secondary | ICD-10-CM | POA: Diagnosis not present

## 2013-05-31 DIAGNOSIS — N189 Chronic kidney disease, unspecified: Secondary | ICD-10-CM

## 2013-05-31 DIAGNOSIS — E162 Hypoglycemia, unspecified: Secondary | ICD-10-CM | POA: Diagnosis not present

## 2013-05-31 DIAGNOSIS — R579 Shock, unspecified: Secondary | ICD-10-CM

## 2013-05-31 DIAGNOSIS — Z833 Family history of diabetes mellitus: Secondary | ICD-10-CM

## 2013-05-31 DIAGNOSIS — L8995 Pressure ulcer of unspecified site, unstageable: Secondary | ICD-10-CM | POA: Diagnosis present

## 2013-05-31 DIAGNOSIS — T82898A Other specified complication of vascular prosthetic devices, implants and grafts, initial encounter: Secondary | ICD-10-CM | POA: Diagnosis present

## 2013-05-31 DIAGNOSIS — E871 Hypo-osmolality and hyponatremia: Secondary | ICD-10-CM | POA: Diagnosis present

## 2013-05-31 DIAGNOSIS — L89609 Pressure ulcer of unspecified heel, unspecified stage: Secondary | ICD-10-CM | POA: Diagnosis present

## 2013-05-31 DIAGNOSIS — K2970 Gastritis, unspecified, without bleeding: Secondary | ICD-10-CM

## 2013-05-31 DIAGNOSIS — R6521 Severe sepsis with septic shock: Secondary | ICD-10-CM

## 2013-05-31 DIAGNOSIS — R627 Adult failure to thrive: Secondary | ICD-10-CM

## 2013-05-31 HISTORY — DX: Osteomyelitis, unspecified: M86.9

## 2013-05-31 HISTORY — DX: Peripheral vascular disease, unspecified: I73.9

## 2013-05-31 HISTORY — DX: Gangrene, not elsewhere classified: I96

## 2013-05-31 LAB — CBC WITH DIFFERENTIAL/PLATELET
BASOS ABS: 0 10*3/uL (ref 0.0–0.1)
Basophils Relative: 0 % (ref 0–1)
EOS ABS: 0 10*3/uL (ref 0.0–0.7)
EOS PCT: 0 % (ref 0–5)
HEMATOCRIT: 38.4 % (ref 36.0–46.0)
Hemoglobin: 12.8 g/dL (ref 12.0–15.0)
Lymphocytes Relative: 5 % — ABNORMAL LOW (ref 12–46)
Lymphs Abs: 1 10*3/uL (ref 0.7–4.0)
MCH: 31.1 pg (ref 26.0–34.0)
MCHC: 33.3 g/dL (ref 30.0–36.0)
MCV: 93.4 fL (ref 78.0–100.0)
Monocytes Absolute: 2.8 10*3/uL — ABNORMAL HIGH (ref 0.1–1.0)
Monocytes Relative: 14 % — ABNORMAL HIGH (ref 3–12)
Neutro Abs: 16 10*3/uL — ABNORMAL HIGH (ref 1.7–7.7)
Neutrophils Relative %: 81 % — ABNORMAL HIGH (ref 43–77)
PLATELETS: 295 10*3/uL (ref 150–400)
RBC: 4.11 MIL/uL (ref 3.87–5.11)
RDW: 17.8 % — ABNORMAL HIGH (ref 11.5–15.5)
WBC: 19.9 10*3/uL — ABNORMAL HIGH (ref 4.0–10.5)

## 2013-05-31 LAB — CK TOTAL AND CKMB (NOT AT ARMC)
CK, MB: 2.3 ng/mL (ref 0.3–4.0)
Relative Index: INVALID (ref 0.0–2.5)
Total CK: 36 U/L (ref 7–177)

## 2013-05-31 LAB — HEPATIC FUNCTION PANEL
ALK PHOS: 190 U/L — AB (ref 39–117)
ALT: 5 U/L (ref 0–35)
AST: 20 U/L (ref 0–37)
Albumin: 1.6 g/dL — ABNORMAL LOW (ref 3.5–5.2)
BILIRUBIN INDIRECT: 0.4 mg/dL (ref 0.3–0.9)
BILIRUBIN TOTAL: 0.6 mg/dL (ref 0.3–1.2)
Bilirubin, Direct: 0.2 mg/dL (ref 0.0–0.3)
TOTAL PROTEIN: 5.7 g/dL — AB (ref 6.0–8.3)

## 2013-05-31 LAB — BASIC METABOLIC PANEL
BUN: 7 mg/dL (ref 6–23)
CALCIUM: 10.7 mg/dL — AB (ref 8.4–10.5)
CO2: 30 mEq/L (ref 19–32)
Chloride: 98 mEq/L (ref 96–112)
Creatinine, Ser: 2.33 mg/dL — ABNORMAL HIGH (ref 0.50–1.10)
GFR calc Af Amer: 22 mL/min — ABNORMAL LOW (ref >90)
GFR calc non Af Amer: 19 mL/min — ABNORMAL LOW (ref >90)
Glucose, Bld: 109 mg/dL — ABNORMAL HIGH (ref 70–99)
Potassium: 3.5 mEq/L — ABNORMAL LOW (ref 3.7–5.3)
SODIUM: 145 meq/L (ref 137–147)

## 2013-05-31 LAB — AMMONIA: AMMONIA: 10 umol/L — AB (ref 11–60)

## 2013-05-31 LAB — SEDIMENTATION RATE: Sed Rate: 37 mm/hr — ABNORMAL HIGH (ref 0–22)

## 2013-05-31 LAB — LACTIC ACID, PLASMA: LACTIC ACID, VENOUS: 4.4 mmol/L — AB (ref 0.5–2.2)

## 2013-05-31 LAB — PROTIME-INR
INR: 1.27 (ref 0.00–1.49)
PROTHROMBIN TIME: 15.6 s — AB (ref 11.6–15.2)

## 2013-05-31 LAB — CBG MONITORING, ED: Glucose-Capillary: 82 mg/dL (ref 70–99)

## 2013-05-31 LAB — TROPONIN I: Troponin I: <0.3 ng/mL (ref <0.30)

## 2013-05-31 MED ORDER — CARBIDOPA-LEVODOPA 25-100 MG PO TABS
1.5000 | ORAL_TABLET | Freq: Three times a day (TID) | ORAL | Status: DC
  Administered 2013-06-02 – 2013-06-04 (×4): 1.5 via ORAL
  Filled 2013-05-31 (×8): qty 2

## 2013-05-31 MED ORDER — NEPRO/CARBSTEADY PO LIQD
237.0000 mL | ORAL | Status: DC
  Administered 2013-06-02 – 2013-06-04 (×2): 237 mL via ORAL

## 2013-05-31 MED ORDER — LEVOTHYROXINE SODIUM 25 MCG PO TABS
25.0000 ug | ORAL_TABLET | Freq: Every day | ORAL | Status: DC
  Filled 2013-05-31: qty 1

## 2013-05-31 MED ORDER — PENTAFLUOROPROP-TETRAFLUOROETH EX AERO
1.0000 "application " | INHALATION_SPRAY | Freq: Every day | CUTANEOUS | Status: DC
  Administered 2013-06-04 – 2013-06-08 (×3): 1 via TOPICAL
  Filled 2013-05-31 (×2): qty 103.5

## 2013-05-31 MED ORDER — PIPERACILLIN-TAZOBACTAM IN DEX 2-0.25 GM/50ML IV SOLN
2.2500 g | Freq: Three times a day (TID) | INTRAVENOUS | Status: DC
  Administered 2013-06-01 – 2013-06-03 (×7): 2.25 g via INTRAVENOUS
  Filled 2013-05-31 (×8): qty 50

## 2013-05-31 MED ORDER — SODIUM CHLORIDE 0.9 % IJ SOLN: 3.0000 mL | INTRAMUSCULAR | Status: DC | PRN

## 2013-05-31 MED ORDER — PIPERACILLIN-TAZOBACTAM IN DEX 2-0.25 GM/50ML IV SOLN
INTRAVENOUS | Status: AC
  Filled 2013-05-31: qty 50

## 2013-05-31 MED ORDER — ACETAMINOPHEN 650 MG RE SUPP
650.0000 mg | Freq: Once | RECTAL | Status: AC
  Administered 2013-05-31: 650 mg via RECTAL
  Filled 2013-05-31: qty 1

## 2013-05-31 MED ORDER — FLUOXETINE HCL 20 MG PO CAPS
40.0000 mg | ORAL_CAPSULE | Freq: Every morning | ORAL | Status: DC
  Filled 2013-05-31: qty 2

## 2013-05-31 MED ORDER — SODIUM CHLORIDE 0.9 % IJ SOLN
3.0000 mL | Freq: Two times a day (BID) | INTRAMUSCULAR | Status: DC
  Administered 2013-05-31 – 2013-06-10 (×15): 3 mL via INTRAVENOUS

## 2013-05-31 MED ORDER — HYDROCODONE-ACETAMINOPHEN 5-325 MG PO TABS: 1.0000 | ORAL_TABLET | ORAL | Status: DC | PRN

## 2013-05-31 MED ORDER — CINACALCET HCL 30 MG PO TABS
30.0000 mg | ORAL_TABLET | Freq: Every day | ORAL | Status: DC
  Filled 2013-05-31 (×2): qty 1

## 2013-05-31 MED ORDER — INSULIN ASPART 100 UNIT/ML ~~LOC~~ SOLN
0.0000 [IU] | Freq: Three times a day (TID) | SUBCUTANEOUS | Status: DC
  Administered 2013-06-03 – 2013-06-05 (×3): 1 [IU] via SUBCUTANEOUS
  Administered 2013-06-05 – 2013-06-06 (×2): 3 [IU] via SUBCUTANEOUS
  Administered 2013-06-06: 2 [IU] via SUBCUTANEOUS
  Administered 2013-06-08: 5 [IU] via SUBCUTANEOUS
  Administered 2013-06-08: 1 [IU] via SUBCUTANEOUS
  Administered 2013-06-08: 2 [IU] via SUBCUTANEOUS
  Administered 2013-06-09: 3 [IU] via SUBCUTANEOUS
  Administered 2013-06-09 (×2): 1 [IU] via SUBCUTANEOUS
  Administered 2013-06-10: 5 [IU] via SUBCUTANEOUS
  Administered 2013-06-10: 1 [IU] via SUBCUTANEOUS
  Administered 2013-06-10: 3 [IU] via SUBCUTANEOUS
  Administered 2013-06-11: 1 [IU] via SUBCUTANEOUS
  Administered 2013-06-15: 3 [IU] via SUBCUTANEOUS

## 2013-05-31 MED ORDER — RENA-VITE PO TABS
1.0000 | ORAL_TABLET | Freq: Two times a day (BID) | ORAL | Status: DC
  Administered 2013-06-02 – 2013-06-11 (×14): 1 via ORAL
  Administered 2013-06-11: 13:00:00 via ORAL
  Administered 2013-06-12 – 2013-06-17 (×10): 1 via ORAL
  Administered 2013-06-17: 12:00:00 via ORAL
  Filled 2013-05-31 (×36): qty 1

## 2013-05-31 MED ORDER — RESOURCE THICKENUP CLEAR PO POWD
ORAL | Status: DC | PRN
  Filled 2013-05-31: qty 125

## 2013-05-31 MED ORDER — PIPERACILLIN-TAZOBACTAM IN DEX 2-0.25 GM/50ML IV SOLN
2.2500 g | Freq: Once | INTRAVENOUS | Status: AC
  Administered 2013-05-31: 2.25 g via INTRAVENOUS
  Filled 2013-05-31: qty 50

## 2013-05-31 MED ORDER — SODIUM CHLORIDE 0.9 % IJ SOLN
3.0000 mL | Freq: Two times a day (BID) | INTRAMUSCULAR | Status: DC
  Administered 2013-05-31 – 2013-06-10 (×11): 3 mL via INTRAVENOUS

## 2013-05-31 MED ORDER — CLOPIDOGREL BISULFATE 75 MG PO TABS
75.0000 mg | ORAL_TABLET | Freq: Every day | ORAL | Status: DC
  Filled 2013-05-31: qty 1

## 2013-05-31 MED ORDER — CARVEDILOL 3.125 MG PO TABS
18.7500 mg | ORAL_TABLET | Freq: Two times a day (BID) | ORAL | Status: DC
  Filled 2013-05-31: qty 1
  Filled 2013-05-31: qty 2

## 2013-05-31 MED ORDER — PIPERACILLIN-TAZOBACTAM 3.375 G IVPB
INTRAVENOUS | Status: AC
  Filled 2013-05-31: qty 50

## 2013-05-31 MED ORDER — SODIUM CHLORIDE 0.9 % IV SOLN: 250.0000 mL | INTRAVENOUS | Status: DC | PRN

## 2013-05-31 MED ORDER — VANCOMYCIN HCL IN DEXTROSE 1-5 GM/200ML-% IV SOLN
1000.0000 mg | Freq: Once | INTRAVENOUS | Status: AC
  Administered 2013-05-31: 1000 mg via INTRAVENOUS
  Filled 2013-05-31: qty 200

## 2013-05-31 MED ORDER — VITAMIN D (ERGOCALCIFEROL) 1.25 MG (50000 UNIT) PO CAPS
50000.0000 [IU] | ORAL_CAPSULE | ORAL | Status: DC
  Filled 2013-05-31: qty 1

## 2013-05-31 MED ORDER — INSULIN ASPART 100 UNIT/ML ~~LOC~~ SOLN
0.0000 [IU] | Freq: Every day | SUBCUTANEOUS | Status: DC
  Administered 2013-06-08: 4 [IU] via SUBCUTANEOUS
  Administered 2013-06-09: 2 [IU] via SUBCUTANEOUS
  Administered 2013-06-14: 3 [IU] via SUBCUTANEOUS

## 2013-05-31 MED ORDER — DONEPEZIL HCL 10 MG PO TABS
10.0000 mg | ORAL_TABLET | Freq: Every day | ORAL | Status: DC
  Administered 2013-06-02 – 2013-06-09 (×6): 10 mg via ORAL
  Filled 2013-05-31 (×3): qty 1
  Filled 2013-05-31 (×3): qty 2
  Filled 2013-05-31: qty 1
  Filled 2013-05-31: qty 2
  Filled 2013-05-31: qty 1
  Filled 2013-05-31: qty 2

## 2013-05-31 MED ORDER — ATORVASTATIN CALCIUM 10 MG PO TABS
10.0000 mg | ORAL_TABLET | Freq: Every morning | ORAL | Status: DC
  Administered 2013-06-04 – 2013-06-06 (×3): 10 mg via ORAL
  Filled 2013-05-31 (×7): qty 1

## 2013-05-31 MED ORDER — LEVOFLOXACIN 500 MG PO TABS
500.0000 mg | ORAL_TABLET | Freq: Every day | ORAL | Status: DC
  Filled 2013-05-31: qty 1

## 2013-05-31 NOTE — ED Notes (Signed)
Pt granddaughter reports picking pt up from dialysis slumped over in waiting area after treatment. Pt breathing , unresponsive to verbal stimuli. Responds to sternal rub.

## 2013-05-31 NOTE — ED Provider Notes (Signed)
CSN: [id number]     Arrival date & time 05/31/13  1727 History  This chart was scribed for Maudry Diego, MD by Marcha Dutton, ED Scribe. This patient was seen in room APA14/APA14 and the patient's care was started at 5:57 PM.    Chief Complaint  Patient presents with  . Altered Mental Status    Patient is a 80 y.o. female presenting with altered mental status. The history is limited by the condition of the patient.  Altered Mental Status Presenting symptoms: partial responsiveness   Severity:  Moderate Most recent episode:  Today Episode history:  Single Duration:  2 hours Timing:  Constant Progression:  Unchanged Chronicity:  New Context comment:  Pt found by granddaughter slumped over after dialysis treatment in waiting area.    Lvl 5 Caveat: altered mental status    Past Medical History  Diagnosis Date  . Hyperlipidemia     takes Lipitor daily  . DVT (deep venous thrombosis)   . Stroke   . Parkinson's disease     takes Sinemet daily  . Tibia/fibula fracture 01/04/2012    Treated with long leg casting. Cast change in hospital 02/27/12   . Peripheral vascular disease 02/27/2012    Skin ulcer of right great toe  . Atrial fibrillation 03/09/2012    Seen on older EKG as well but family not aware.   . Macrocytic anemia 03/09/2012  . Lower GI bleed 03/10/2012    hematochezia secondary to ulcerated polyps; diverticulosis; chronic diarrhea  . End stage renal disease on dialysis     [facility] M[facility]  . Hypertension     takes Coreg daily  . Dementia     takes Aricept daily  . Diabetes mellitus, type II     takes Lantus and Novolog  . Seasonal allergies     takes Allegra daily  . Depression     takes Prozac daily  . Hypothyroidism     takes Synthroid daily    Past Surgical History  Procedure Laterality Date  . Dg av dialysis graft declot or    . Av fistula repair  Aug. 2013    Left arm  . Cholecystectomy    . Colon surgery      [facility]rox [facility] at [facility]  after she had a colonoscopy  . Colonoscopy  03/10/2012    Procedure: COLONOSCOPY;  Surgeon: Rogene Houston, MD;  [facility];  Service: Endoscopy;  Laterality: N/A;  . Esophagogastroduodenoscopy  03/10/2012    Procedure: ESOPHAGOGASTRODUODENOSCOPY (EGD);  Surgeon: Rogene Houston, MD;  [facility];  Service: Endoscopy;  Laterality: N/A;  EGD if TCS normal.  . Ligation of arteriovenous  fistula Left 03/24/2012    Procedure: LIGATION OF ARTERIOVENOUS  FISTULA;  Surgeon: Conrad Farmers, MD;  Location: [place];  Service: Vascular;  Laterality: Left;  . Av fistula placement Right 04/28/2012    Procedure: INSERTION OF ARTERIOVENOUS (AV) GORE-TEX GRAFT ARM;  Surgeon: Elam Dutch, MD;  Location[facility];  Service: Vascular;  Laterality: Right;  . Leg amputation above knee Left   . Angioplast of lle  12/2012-01/2013    Several Procedures  . Esophagogastroduodenoscopy N/A 02/14/2013    Procedure: ESOPHAGOGASTRODUODENOSCOPY (EGD);  Surgeon: Milus Banister, MD;  Location: [place];  Service: Endoscopy;  Laterality: N/A;  . Cardiac catheterization  03/08/2004    Normal coronary arteries and normal LV function. Normal aortoiliac anatomy suitable for renal transplant.  . Transthoracic echocardiogram  02/07/2004  EF 60%, no evidence of significant valvular abnormalities.  . Finger amputation Right     middle    Family History  Problem Relation Age of Onset  . Stroke Mother   . Diabetes Mother   . Cancer Mother   . Hypertension Mother   . Coronary artery disease Father   . Diabetes Sister     History  Substance Use Topics  . Smoking status: Passive Smoke Exposure - Never Smoker    Types: Cigarettes  . Smokeless tobacco: Never Used  . Alcohol Use: No    OB History   Grav Para Term Preterm Abortions TAB SAB Ect Mult Living                  Review of Systems  Unable to perform ROS: Mental status change     Allergies  Hydralazine and Hydrochlorothiazide  Home  Medications   Prior to Admission medications   Medication Sig Start Date End Date Taking? Authorizing Provider  atorvastatin (LIPITOR) 10 MG tablet Take 10 mg by mouth every morning.     Historical Provider, MD  carbidopa-levodopa (SINEMET IR) 25-100 MG per tablet Take 1.5 tablets by mouth 3 (three) times daily.    Historical Provider, MD  carvedilol (COREG) 6.25 MG tablet Take 3 tablets (18.75 mg total) by mouth 2 (two) times daily with a meal. 02/17/13   Geradine Girt, DO  cinacalcet (SENSIPAR) 30 MG tablet Take 30 mg by mouth every evening.     Historical Provider, MD  donepezil (ARICEPT) 10 MG tablet Take 10 mg by mouth at bedtime.     Historical Provider, MD  FLUoxetine (PROZAC) 40 MG capsule Take 40 mg by mouth every morning.     Historical Provider, MD  insulin glargine (LANTUS) 100 UNIT/ML injection Inject 1-5 Units into the skin at bedtime as needed. Per Sliding Scale    Historical Provider, MD  levothyroxine (SYNTHROID, LEVOTHROID) 25 MCG tablet Take 25 mcg by mouth every morning.    Historical Provider, MD  Maltodextrin-Xanthan Gum ([place]) POWD To thicken liquids to nectar thick 03/09/13   Geradine Girt, DO  multivitamin (RENA-VIT) TABS tablet Take 1 tablet by mouth 2 (two) times daily.     Historical Provider, MD  Nutritional Supplements (FEEDING SUPPLEMENT, NEPRO CARB STEADY,) LIQD Take 237 mLs by mouth daily. 02/17/13   Geradine Girt, DO    Triage Vitals: BP 71/48  Pulse 120  Temp(Src) 100.1 F (37.8 C) (Rectal)  Resp 16  Wt 120 lb 5.9 oz (54.6 kg)   Physical Exam  Nursing note and vitals reviewed. Constitutional: She is oriented to person, place, and time. She appears well-developed.  Awake  HENT:  Head: Normocephalic.  Eyes: Conjunctivae and EOM are normal. No scleral icterus.  Neck: Neck supple. No thyromegaly present.  Cardiovascular: Normal rate and regular rhythm.  Exam reveals no gallop and no friction rub.   No murmur heard. Pulmonary/Chest:  No stridor. She has no wheezes. She has no rales. She exhibits no tenderness.  Abdominal: She exhibits no distension. There is no tenderness. There is no rebound.  Musculoskeletal: Normal range of motion. She exhibits no edema.  ATK amputation on the left  Lymphadenopathy:    She has no cervical adenopathy.  Neurological: She is oriented to person, place, and time. She exhibits normal muscle tone. Coordination normal.  Oriented to person only  Skin: No rash noted. No erythema.  Psychiatric: She has a normal mood and affect. Her  behavior is normal.    ED Course  Procedures (including critical care time)  DIAGNOSTIC STUDIES: Oxygen Saturation is 97% on RA, normal by my interpretation.    COORDINATION OF CARE: 6:01 PM- Pt advised of plan for treatment and pt agrees.    Labs Review Labs Reviewed  CULTURE, BLOOD (ROUTINE X 2)  CULTURE, BLOOD (ROUTINE X 2)  CBC WITH DIFFERENTIAL  BASIC METABOLIC PANEL  LACTIC ACID, PLASMA  CBG MONITORING, ED  CBG MONITORING, ED    Imaging Review No results found.   EKG Interpretation   Date/Time:  Monday May 31 2013 17:33:14 EDT Ventricular Rate:  114 PR Interval:    QRS Duration: 100 QT Interval:  386 QTC Calculation: 532 R Axis:   24 Text Interpretation:  Atrial fibrillation with rapid ventricular response  Nonspecific ST and T wave abnormality Prolonged QT Abnormal ECG When  compared with ECG of 01-Apr-2013 16:00, Atrial fibrillation has replaced  Sinus rhythm Vent. rate has increased BY  38 BPM Confirmed by Meia Emley  MD,  Odell Choung [id number]) on 05/31/2013 8:45:24 PM      MDM   Final diagnoses:  None    The chart was scribed for me under my direct supervision.  I personally performed the history, physical, and medical decision making and all procedures in the evaluation of this patient.Marland Kitchen t} The chart was scribed for me under my direct supervision.  I personally performed the history, physical, and medical decision making and all  procedures in the evaluation of this patient.Maudry Diego, MD 05/31/13 2045

## 2013-05-31 NOTE — H&P (Signed)
Katrina Meyer is an 80 y.o. female.  \  Pcp: Dr. Woody Seller Nephrologist: Erling Cruz  Chief Complaint: altered mental status HPI: 80yo female withhx of dementia, parkinsons, ESRD on HD MWF, was found not responding , slumped over in a chair, drooling by her granddaughter, at about 5 pm.  Pt was unable to give any history ,  She was noted to be hypotensive in the ED.  Which responded to fluid. Pt had been at dialysis today.  Pt is responsive and responds to name, and smiles.    Past Medical History  Diagnosis Date  . Hyperlipidemia     takes Lipitor daily  . DVT (deep venous thrombosis)   . Stroke   . Parkinson's disease     takes Sinemet daily  . Tibia/fibula fracture 01/04/2012    Treated with long leg casting. Cast change in hospital 02/27/12   . Peripheral vascular disease 02/27/2012    Skin ulcer of right great toe  . Atrial fibrillation 03/09/2012    Seen on older EKG as well but family not aware.   . Macrocytic anemia 03/09/2012  . Lower GI bleed 03/10/2012    hematochezia secondary to ulcerated polyps; diverticulosis; chronic diarrhea  . End stage renal disease on dialysis     [facility]- MWF  . Hypertension     takes Coreg daily  . Dementia     takes Aricept daily  . Diabetes mellitus, type II     takes Lantus and Novolog  . Seasonal allergies     takes Allegra daily  . Depression     takes Prozac daily  . Hypothyroidism     takes Synthroid daily    Past Surgical History  Procedure Laterality Date  . Dg av dialysis graft declot or    . Av fistula repair  Aug. 2013    Left arm  . Cholecystectomy    . Colon surgery      Aprrox 2006 at [facility] after she had a colonoscopy  . Colonoscopy  03/10/2012    Procedure: COLONOSCOPY;  Surgeon: Rogene Houston, MD;  [facility];  Service: Endoscopy;  Laterality: N/A;  . Esophagogastroduodenoscopy  03/10/2012    Procedure: ESOPHAGOGASTRODUODENOSCOPY (EGD);  Surgeon: Rogene Houston, MD;  [facility];   Service: Endoscopy;  Laterality: N/A;  EGD if TCS normal.  . Ligation of arteriovenous  fistula Left 03/24/2012    Procedure: LIGATION OF ARTERIOVENOUS  FISTULA;  Surgeon: Conrad Yorkville, MD;  Location: [place];  Service: Vascular;  Laterality: Left;  . Av fistula placement Right 04/28/2012    Procedure: INSERTION OF ARTERIOVENOUS (AV) GORE-TEX GRAFT ARM;  Surgeon: Elam Dutch, MD;  [facility];  Service: Vascular;  Laterality: Right;  . Leg amputation above knee Left   . Angioplast of lle  12/2012-01/2013    Several Procedures  . Esophagogastroduodenoscopy N/A 02/14/2013    Procedure: ESOPHAGOGASTRODUODENOSCOPY (EGD);  Surgeon: Milus Banister, MD;  Location: [place];  Service: Endoscopy;  Laterality: N/A;  . Cardiac catheterization  03/08/2004    Normal coronary arteries and normal LV function. Normal aortoiliac anatomy suitable for renal transplant.  . Transthoracic echocardiogram  02/07/2004    EF 60%, no evidence of significant valvular abnormalities.  . Finger amputation Right     middle  . Angioplasty rll  12/2012, 2015    at [facility]  . Angioplasty r hand  12/2012, 2015    at [facility] History  Problem Relation Age of Onset  . Stroke Mother   . Diabetes Mother   . Cancer Mother   . Hypertension Mother   . Coronary artery disease Father   . Diabetes Sister    Social History:  reports that she has been passively smoking Cigarettes.  She has been smoking about 0.00 packs per day. She has never used smokeless tobacco. She reports that she does not drink alcohol or use illicit drugs.  Allergies:  Allergies  Allergen Reactions  . Hydralazine Rash  . Hydrochlorothiazide Hives and Rash     (Not in a hospital admission)  Results for orders placed during the hospital encounter of 05/31/13 (from the past 48 hour(s))  CBG MONITORING, ED     Status: None   Collection Time    05/31/13  5:32 PM      Result Value Ref Range   Glucose-Capillary 82  70 - 99 mg/dL  CBC WITH  DIFFERENTIAL     Status: Abnormal   Collection Time    05/31/13  6:27 PM      Result Value Ref Range   WBC 19.9 (*) 4.0 - 10.5 K/uL   RBC 4.11  3.87 - 5.11 MIL/uL   Hemoglobin 12.8  12.0 - 15.0 g/dL   HCT 38.4  36.0 - 46.0 %   MCV 93.4  78.0 - 100.0 fL   MCH 31.1  26.0 - 34.0 pg   MCHC 33.3  30.0 - 36.0 g/dL   RDW 17.8 (*) 11.5 - 15.5 %   Platelets 295  150 - 400 K/uL   Neutrophils Relative % 81 (*) 43 - 77 %   Neutro Abs 16.0 (*) 1.7 - 7.7 K/uL   Lymphocytes Relative 5 (*) 12 - 46 %   Lymphs Abs 1.0  0.7 - 4.0 K/uL   Monocytes Relative 14 (*) 3 - 12 %   Monocytes Absolute 2.8 (*) 0.1 - 1.0 K/uL   Eosinophils Relative 0  0 - 5 %   Eosinophils Absolute 0.0  0.0 - 0.7 K/uL   Basophils Relative 0  0 - 1 %   Basophils Absolute 0.0  0.0 - 0.1 K/uL  BASIC METABOLIC PANEL     Status: Abnormal   Collection Time    05/31/13  6:27 PM      Result Value Ref Range   Sodium 145  137 - 147 mEq/L   Potassium 3.5 (*) 3.7 - 5.3 mEq/L   Chloride 98  96 - 112 mEq/L   CO2 30  19 - 32 mEq/L   Glucose, Bld 109 (*) 70 - 99 mg/dL   BUN 7  6 - 23 mg/dL   Creatinine, Ser 2.33 (*) 0.50 - 1.10 mg/dL   Calcium 10.7 (*) 8.4 - 10.5 mg/dL   GFR calc non Af Amer 19 (*) >90 mL/min   GFR calc Af Amer 22 (*) >90 mL/min   Comment: (NOTE)     The eGFR has been calculated using the CKD EPI equation.     This calculation has not been validated in all clinical situations.     eGFR's persistently <90 mL/min signify possible Chronic Kidney     Disease.  LACTIC ACID, PLASMA     Status: Abnormal   Collection Time    05/31/13  6:27 PM      Result Value Ref Range   Lactic Acid, Venous 4.4 (*) 0.5 - 2.2 mmol/L  PROTIME-INR     Status: Abnormal   Collection Time  05/31/13  6:31 PM      Result Value Ref Range   Prothrombin Time 15.6 (*) 11.6 - 15.2 seconds   INR 1.27  0.00 - 1.49   Ct Head Wo Contrast  05/31/2013   CLINICAL DATA:  Altered mental status  EXAM: CT HEAD WITHOUT CONTRAST  TECHNIQUE: Contiguous  axial images were obtained from the base of the skull through the vertex without intravenous contrast.  COMPARISON:  Prior CT from 03/02/2013  FINDINGS: Diffuse prominence of the CSF containing spaces is compatible with generalized cerebral atrophy. Scattered and confluent hypodensity within the periventricular and deep white matter of both cerebral hemispheres is most compatible with moderate chronic microvascular ischemic changes. Remote left basal ganglia infarcts noted, unchanged.  There is no acute intracranial hemorrhage or infarct. No mass lesion or midline shift. Gray-white matter differentiation is well maintained. Ventricles are normal in size without evidence of hydrocephalus. CSF containing spaces are within normal limits. No extra-axial fluid collection.  The calvarium is intact.  Orbital soft tissues are within normal limits.  The paranasal sinuses and mastoid air cells are well pneumatized and free of fluid.  Scalp soft tissues are unremarkable.  IMPRESSION: 1. No acute intracranial process. 2. Stable atrophy with moderate chronic microvascular ischemic disease involving the supratentorial white matter. 3. Remote left basal ganglia infarct.   Electronically Signed   By: Jeannine Boga M.D.   On: 05/31/2013 19:21   Dg Chest Portable 1 View  05/31/2013   CLINICAL DATA:  Altered mental status; history of previous CVA as well as Parkinson's disease, cardiac dysrhythmia, diabetes and dialysis dependent renal failure  EXAM: PORTABLE CHEST - 1 VIEW  COMPARISON:  DG CHEST 1V PORT dated 03/07/2013  FINDINGS: The lungs are adequately inflated. There is no focal infiltrate. The cardiopericardial silhouette is mildly enlarged but stable. The pulmonary vascularity is not engorged. There is no significant pleural effusion demonstrated. There is tortuosity of the descending thoracic aorta. A vascular graft is present presumably in the right axilla.  IMPRESSION: There is no evidence of pneumonia nor CHF  currently. There is mild stable enlargement of the cardiac silhouette.   Electronically Signed   By: David  Martinique   On: 05/31/2013 18:18    ROS negative for all organ systems except for + above  Blood pressure 120/57, pulse 111, temperature 98.8 F (37.1 C), temperature source Rectal, resp. rate 15, weight 120 lb 5.9 oz (54.6 kg), SpO2 95.00%. Physical Exam  Heent: anicteric Neck: no jvd Heart: tachy s1, s2 Lung: ctab Abd: soft, nt, nd, +bs Ext: no c/c/e, evidence of prior amputation of the left leg Skin:  Dark eschar on the right heel and lateral aspect of the right ft, actually improved per the daughter since revascularization,  Sutures in the right hand where finger amputation intact, no erythema,  Unable to examine decub which per daughter is stage 1   Assessment/Plan  Fever, unclear source  ddx skin ulcers,     Blood cultures x2 pending esr pending,    AMS:  Check MRI brain r/o CVA Check b12 esr, rpr, tsh, ammonia  Hypotension: resolved with ns iv Cycle cardiac markers Check cortisol Check echo  Tachycardia Cycle cardiac markers Check tsh Check echo  ESRD on HD M, W, F Please consult nephrology in the am  Dm2:  fsbs ac and qhs iss  DVT prophylaxis: scd     Jani Gravel 05/31/2013, 9:03 PM

## 2013-05-31 NOTE — Progress Notes (Signed)
ANTIBIOTIC CONSULT NOTE - INITIAL  Pharmacy Consult for Vancomycin and Zosyn  Indication: rule out sepsis, wound infection  Allergies  Allergen Reactions  . Hydralazine Rash  . Hydrochlorothiazide Hives and Rash    Patient Measurements: Height: 5' 6.93" (170 cm) Weight: 118 lb 13.3 oz (53.9 kg) IBW/kg (Calculated) : 61.44  Vital Signs: Temp: 98 F (36.7 C) (04/27 2205) Temp src: Oral (04/27 2205) BP: 112/63 mmHg (04/27 2205) Pulse Rate: 120 (04/27 2205) Intake/Output from previous day:   Intake/Output from this shift:    Labs:  Recent Labs  05/31/13 1827  WBC 19.9*  HGB 12.8  PLT 295  CREATININE 2.33*   Estimated Creatinine Clearance: 16.9 ml/min (by C-G formula based on Cr of 2.33). No results found for this basename: VANCOTROUGH, VANCOPEAK, VANCORANDOM, GENTTROUGH, GENTPEAK, GENTRANDOM, TOBRATROUGH, TOBRAPEAK, TOBRARND, AMIKACINPEAK, AMIKACINTROU, AMIKACIN,  in the last 72 hours   Microbiology: No results found for this or any previous visit (from the past 720 hour(s)).  Medical History: Past Medical History  Diagnosis Date  . Hyperlipidemia     takes Lipitor daily  . DVT (deep venous thrombosis)   . Stroke   . Parkinson's disease     takes Sinemet daily  . Tibia/fibula fracture 01/04/2012    Treated with long leg casting. Cast change in hospital 02/27/12   . Peripheral vascular disease 02/27/2012    Skin ulcer of right great toe  . Atrial fibrillation 03/09/2012    Seen on older EKG as well but family not aware.   . Macrocytic anemia 03/09/2012  . Lower GI bleed 03/10/2012    hematochezia secondary to ulcerated polyps; diverticulosis; chronic diarrhea  . End stage renal disease on dialysis     [facility]- MWF  . Hypertension     takes Coreg daily  . Dementia     takes Aricept daily  . Diabetes mellitus, type II     takes Lantus and Novolog  . Seasonal allergies     takes Allegra daily  . Depression     takes Prozac daily  . Hypothyroidism     takes Synthroid daily    Medications:  Scheduled:  . piperacillin-tazobactam      . sodium chloride  3 mL Intravenous Q12H  . sodium chloride  3 mL Intravenous Q12H  . vancomycin  1,000 mg Intravenous Once   Assessment: Okay for Protocol, ESRD patient.  Renal consult pending.  Zosyn 4/27 >> Vancomycin 4/27 >>  Goal of Therapy:  Pre-Hemodialysis Vancomycin level goal range =15-25 mcg/ml. Eradicate infection.   Plan:  Vancomycin 1000mg  IV x 1 tonight already ordered, further dose per HD schedule. Zosyn 2.25gm IV every 8 hours. Measure antibiotic drug levels at steady state Follow up culture results  Pricilla Larsson 05/31/2013,10:28 PM

## 2013-06-01 ENCOUNTER — Inpatient Hospital Stay (HOSPITAL_COMMUNITY): Payer: Medicare Other

## 2013-06-01 ENCOUNTER — Encounter (HOSPITAL_COMMUNITY): Payer: Self-pay | Admitting: *Deleted

## 2013-06-01 DIAGNOSIS — N186 End stage renal disease: Secondary | ICD-10-CM

## 2013-06-01 DIAGNOSIS — I359 Nonrheumatic aortic valve disorder, unspecified: Secondary | ICD-10-CM

## 2013-06-01 DIAGNOSIS — G9340 Encephalopathy, unspecified: Secondary | ICD-10-CM | POA: Diagnosis present

## 2013-06-01 DIAGNOSIS — E43 Unspecified severe protein-calorie malnutrition: Secondary | ICD-10-CM | POA: Diagnosis present

## 2013-06-01 DIAGNOSIS — F0390 Unspecified dementia without behavioral disturbance: Secondary | ICD-10-CM

## 2013-06-01 DIAGNOSIS — I4891 Unspecified atrial fibrillation: Secondary | ICD-10-CM

## 2013-06-01 LAB — TROPONIN I: Troponin I: 0.48 ng/mL (ref <0.30)

## 2013-06-01 LAB — GLUCOSE, CAPILLARY
GLUCOSE-CAPILLARY: 131 mg/dL — AB (ref 70–99)
GLUCOSE-CAPILLARY: 144 mg/dL — AB (ref 70–99)
Glucose-Capillary: 120 mg/dL — ABNORMAL HIGH (ref 70–99)
Glucose-Capillary: 108 mg/dL — ABNORMAL HIGH (ref 70–99)
Glucose-Capillary: 96 mg/dL (ref 70–99)

## 2013-06-01 LAB — HEMOGLOBIN A1C
Hgb A1c MFr Bld: 6 % — ABNORMAL HIGH (ref <5.7)
Mean Plasma Glucose: 126 mg/dL — ABNORMAL HIGH (ref <117)

## 2013-06-01 LAB — TSH: TSH: 0.231 u[IU]/mL — AB (ref 0.350–4.500)

## 2013-06-01 LAB — RPR

## 2013-06-01 LAB — VITAMIN B12: Vitamin B-12: 1725 pg/mL — ABNORMAL HIGH (ref 211–911)

## 2013-06-01 MED ORDER — DILTIAZEM HCL 25 MG/5ML IV SOLN
10.0000 mg | Freq: Once | INTRAVENOUS | Status: AC
Start: 2013-06-01 — End: 2013-06-01
  Administered 2013-06-01: 10 mg via INTRAVENOUS
  Filled 2013-06-01: qty 5

## 2013-06-01 MED ORDER — METOPROLOL TARTRATE 1 MG/ML IV SOLN
5.0000 mg | Freq: Four times a day (QID) | INTRAVENOUS | Status: DC
  Administered 2013-06-01 – 2013-06-03 (×6): 5 mg via INTRAVENOUS
  Filled 2013-06-01 (×6): qty 5

## 2013-06-01 MED ORDER — VANCOMYCIN HCL 500 MG IV SOLR
500.0000 mg | INTRAVENOUS | Status: DC
  Administered 2013-06-02 – 2013-06-09 (×4): 500 mg via INTRAVENOUS
  Filled 2013-06-01 (×4): qty 500

## 2013-06-01 MED ORDER — LEVOFLOXACIN IN D5W 500 MG/100ML IV SOLN
500.0000 mg | INTRAVENOUS | Status: DC
  Administered 2013-06-01: 500 mg via INTRAVENOUS
  Filled 2013-06-01 (×2): qty 100

## 2013-06-01 NOTE — Evaluation (Signed)
Clinical/Bedside Swallow Evaluation Patient Details  Name: Katrina Meyer MRN: [id number] Date of Birth: 1932/09/21  Today's Date: 06/01/2013 Time: 1220-1253 SLP Time Calculation (min): 33 min  Past Medical History:  Past Medical History  Diagnosis Date  . Hyperlipidemia     takes Lipitor daily  . DVT (deep venous thrombosis)   . Stroke   . Parkinson's disease     takes Sinemet daily  . Tibia/fibula fracture 01/04/2012    Treated with long leg casting. Cast change in hospital 02/27/12   . Peripheral vascular disease 02/27/2012    Skin ulcer of right great toe  . Atrial fibrillation 03/09/2012    Seen on older EKG as well but family not aware.   . Macrocytic anemia 03/09/2012  . Lower GI bleed 03/10/2012    hematochezia secondary to ulcerated polyps; diverticulosis; chronic diarrhea  . End stage renal disease on dialysis     [facility]- MWF  . Hypertension     takes Coreg daily  . Dementia     takes Aricept daily  . Diabetes mellitus, type II     takes Lantus and Novolog  . Seasonal allergies     takes Allegra daily  . Depression     takes Prozac daily  . Hypothyroidism     takes Synthroid daily   Past Surgical History:  Past Surgical History  Procedure Laterality Date  . Dg av dialysis graft declot or    . Av fistula repair  Aug. 2013    Left arm  . Cholecystectomy    . Colon surgery      Aprrox 2006 at [facility] after she had a colonoscopy  . Colonoscopy  03/10/2012    Procedure: COLONOSCOPY;  Surgeon: Rogene Houston, MD;  [facility];  Service: Endoscopy;  Laterality: N/A;  . Esophagogastroduodenoscopy  03/10/2012    Procedure: ESOPHAGOGASTRODUODENOSCOPY (EGD);  Surgeon: Rogene Houston, MD;  [facility];  Service: Endoscopy;  Laterality: N/A;  EGD if TCS normal.  . Ligation of arteriovenous  fistula Left 03/24/2012    Procedure: LIGATION OF ARTERIOVENOUS  FISTULA;  Surgeon: Conrad , MD;  Location: [place];  Service: Vascular;  Laterality:  Left;  . Av fistula placement Right 04/28/2012    Procedure: INSERTION OF ARTERIOVENOUS (AV) GORE-TEX GRAFT ARM;  Surgeon: Elam Dutch, MD;  Location: [facility];  Service: Vascular;  Laterality: Right;  . Leg amputation above knee Left   . Angioplast of lle  12/2012-01/2013    Several Procedures  . Esophagogastroduodenoscopy N/A 02/14/2013    Procedure: ESOPHAGOGASTRODUODENOSCOPY (EGD);  Surgeon: Milus Banister, MD;  Location: [place];  Service: Endoscopy;  Laterality: N/A;  . Cardiac catheterization  03/08/2004    Normal coronary arteries and normal LV function. Normal aortoiliac anatomy suitable for renal transplant.  . Transthoracic echocardiogram  02/07/2004    EF 60%, no evidence of significant valvular abnormalities.  . Finger amputation Right     middle  . Angioplasty rll  12/2012, 2015    at [facility]  . Angioplasty r hand  12/2012, 2015    at [facility]   HPI:  Mrs. Katrina Meyer is a 80 yo female who was admitted to [facility] after becoming unresponsive following HD. She had a similar episode at the end of January 2015 and was admitted to [facility]. She had MBSS completed at that time (twice) and was placed on a mech soft diet with NTL due to silent aspiration of thins. Grandaughter reports  that pt was recently discharged from [facility] following a 2-week admission. She had been consuming regular textures and thin liquids at home PTA.    Assessment / Plan / Recommendation Clinical Impression  [name] was able to tolerate a few sips of water and ice chips without incident, however alertness waxed and waned and performance ultimately declined and pt deemed unsafe for further trials. SLP spoke with grandaughter who was present for the evaluation who is in agreement with plan for continuation of NPO with re-assessment once alertness improves. Pt may benefit from repeat MBSS once she is closer to her baseline as she was never officially upgraded to thins following recommendation for nectars in February  (silent aspiration of thins). Chest X-ray is clear; Pt receives HD. Recommend NPO with oral care and ice chips presented by nursing when pt is alert and upright. Consider po meds if necessary- crush in puree (pudding as pt hates applesauce). SLP will follow.    Aspiration Risk  Moderate    Diet Recommendation NPO;Ice chips PRN after oral care;NPO except meds   Medication Administration: Crushed with puree (PT DOES NOT LIKE APPLESAUCE!)    Other  Recommendations Recommended Consults:  (Consider MBS once pt closer to her baseline) Oral Care Recommendations: Oral care Q4 per protocol   Follow Up Recommendations  24 hour supervision/assistance    Frequency and Duration min 2x/week  2 weeks       Swallow Study Prior Functional Status       General Date of Onset: 05/31/13 HPI: Mrs. Katrina Meyer is a 80 yo female who was admitted to [facility] after becoming unresponsive following HD. She had a similar episode at the end of January 2015 and was admitted to [facility]. She had MBSS completed at that time (twice) and was placed on a mech soft diet with NTL due to silent aspiration of thins. Grandaughter reports that pt was recently discharged from [facility] following a 2-week admission. She had been consuming regular textures and thin liquids at home PTA.  Type of Study: Bedside swallow evaluation Previous Swallow Assessment: 03/07/2013: MBSS D3/NTL Diet Prior to this Study: NPO Temperature Spikes Noted: No History of Recent Intubation: No Behavior/Cognition: Lethargic;Decreased sustained attention Oral Cavity - Dentition: Adequate natural dentition (missing some dentition) Self-Feeding Abilities: Needs assist Patient Positioning: Upright in bed Baseline Vocal Quality:  (nonverbal today) Volitional Cough: Cognitively unable to elicit Volitional Swallow: Able to elicit (Pt required lots of cueing; very delayed)    Oral/Motor/Sensory Function Overall Oral Motor/Sensory Function:  (grossly WFL, however  decreased mentation limits assessment)   Ice Chips Ice chips: Impaired Presentation: Spoon Oral Phase Impairments: Impaired anterior to posterior transit Oral Phase Functional Implications: Oral holding   Thin Liquid Thin Liquid: Impaired Presentation: Cup;Spoon;Straw Oral Phase Impairments: Impaired anterior to posterior transit Oral Phase Functional Implications: Oral holding (variable performance related to cognition) Pharyngeal  Phase Impairments: Suspected delayed Swallow    Nectar Thick Nectar Thick Liquid: Not tested   Honey Thick Honey Thick Liquid: Impaired Presentation: Spoon Oral Phase Impairments: Reduced lingual movement/coordination;Impaired anterior to posterior transit Oral Phase Functional Implications: Oral holding Pharyngeal Phase Impairments: Suspected delayed Swallow   Puree Puree: Impaired Presentation: Spoon Oral Phase Impairments: Reduced lingual movement/coordination;Impaired anterior to posterior transit Oral Phase Functional Implications: Prolonged oral transit;Oral holding;Oral residue Pharyngeal Phase Impairments: Suspected delayed Swallow   Solid      Solid: Not tested      Thank you,  Genene Churn, [place]  Ephraim Hamburger  06/01/2013,1:17 PM

## 2013-06-01 NOTE — Progress Notes (Signed)
INITIAL NUTRITION ASSESSMENT  DOCUMENTATION CODES Per approved criteria  -Severe malnutrition in the context of chronic illness   Pt meets criteria for severe MALNUTRITION in the context of chronic illness as evidenced by <75% energy intake x 1 month, 21.9% wt loss x 1 year.  INTERVENTION: Follow for diet advancement Add ONS when diet advanced  NUTRITION DIAGNOSIS: Inadequate oral intake related to decreased responsiveness as evidenced by NPO.   Goal: Pt will meet >90% of estimated nutritional needs  Monitor:  Diet advancement, PO intake, labs, skin assessments, I/O's  Reason for Assessment: low braden=12, MST=4  80 y.o. female  Admitting Dx: Acute encephalopathy  ASSESSMENT: Pt admitted for acute encephalopathy.  Per RN, pt with slow swallow response and holding food in her mouth. Pt is currently awaiting swallow evaluation; SLP in room at time of visit.  Pt with hx of poor po intake and progressive wt loss. Wt hx reveals a 21.9% wt loss x 1 year, 17.5% wt loss x 6 months, and a 16.9% wt loss x 3 months, all of which are clinically significant. Per previous RD note, pt was following a liberalized diet at home due to poor po intake. Recommend liberalized diet for diet advancement, due to poor po intake, wt loss, and multiple wounds, for maximum PO intake. Height: Ht Readings from Last 1 Encounters:  05/31/13 5' 6.93" (1.7 m)    Weight: Wt Readings from Last 1 Encounters:  06/01/13 118 lb 2.7 oz (53.6 kg)    Ideal Body Weight: 135#  % Ideal Body Weight: 87%  Wt Readings from Last 10 Encounters:  06/01/13 118 lb 2.7 oz (53.6 kg)  04/01/13 135 lb (61.236 kg)  03/09/13 135 lb 9.3 oz (61.5 kg)  02/17/13 142 lb 3.2 oz (64.5 kg)  02/17/13 142 lb 3.2 oz (64.5 kg)  01/27/13 154 lb 5.2 oz (70 kg)  12/10/12 150 lb 0.6 oz (68.058 kg)  11/24/12 143 lb 4.8 oz (65 kg)  07/23/12 153 lb (69.4 kg)  06/11/12 151 lb (68.493 kg)    Usual Body Weight: 150#  % Usual Body Weight:  79%  BMI:  Body mass index is 18.55 kg/(m^2). Meets criteria for normal weight.   Estimated Nutritional Needs: Kcal: 0272-5366 daily Protein: 80-107 grams daily Fluid: 1 L  Skin: unstageable pressure ulcer to rt heel. Stage II pressure ulcer rt sacrum, stage II pressure ulcer on lt sacrum x 2, wound on lt toe  Diet Order: NPO  EDUCATION NEEDS: -Education not appropriate at this time   Intake/Output Summary (Last 24 hours) at 06/01/13 1435 Last data filed at 06/01/13 0800  Gross per 24 hour  Intake    120 ml  Output      0 ml  Net    120 ml    Last BM: 05/31/13  Labs:   Recent Labs Lab 05/31/13 1827  NA 145  K 3.5*  CL 98  CO2 30  BUN 7  CREATININE 2.33*  CALCIUM 10.7*  GLUCOSE 109*    CBG (last 3)   Recent Labs  05/31/13 2347 06/01/13 0737 06/01/13 1131  GLUCAP 144* 120* 108*    Scheduled Meds: . atorvastatin  10 mg Oral q morning - 10a  . carbidopa-levodopa  1.5 tablet Oral TID  . donepezil  10 mg Oral QHS  . feeding supplement (NEPRO CARB STEADY)  237 mL Oral Q24H  . insulin aspart  0-5 Units Subcutaneous QHS  . insulin aspart  0-9 Units Subcutaneous TID WC  .  levofloxacin (LEVAQUIN) IV  500 mg Intravenous Q24H  . metoprolol  5 mg Intravenous 4 times per day  . multivitamin  1 tablet Oral BID  . pentafluoroprop-tetrafluoroeth  1 application Topical Daily  . piperacillin-tazobactam (ZOSYN)  IV  2.25 g Intravenous Q8H  . sodium chloride  3 mL Intravenous Q12H  . sodium chloride  3 mL Intravenous Q12H  . Vitamin D (Ergocalciferol)  50,000 Units Oral Weekly    Continuous Infusions:   Past Medical History  Diagnosis Date  . Hyperlipidemia     takes Lipitor daily  . DVT (deep venous thrombosis)   . Stroke   . Parkinson's disease     takes Sinemet daily  . Tibia/fibula fracture 01/04/2012    Treated with long leg casting. Cast change in hospital 02/27/12   . Peripheral vascular disease 02/27/2012    Skin ulcer of right great toe  . Atrial  fibrillation 03/09/2012    Seen on older EKG as well but family not aware.   . Macrocytic anemia 03/09/2012  . Lower GI bleed 03/10/2012    hematochezia secondary to ulcerated polyps; diverticulosis; chronic diarrhea  . End stage renal disease on dialysis     [facility]- MWF  . Hypertension     takes Coreg daily  . Dementia     takes Aricept daily  . Diabetes mellitus, type II     takes Lantus and Novolog  . Seasonal allergies     takes Allegra daily  . Depression     takes Prozac daily  . Hypothyroidism     takes Synthroid daily    Past Surgical History  Procedure Laterality Date  . Dg av dialysis graft declot or    . Av fistula repair  Aug. 2013    Left arm  . Cholecystectomy    . Colon surgery      Aprrox 2006 at [facility] after she had a colonoscopy  . Colonoscopy  03/10/2012    Procedure: COLONOSCOPY;  Surgeon: Rogene Houston, MD;  Location: [facility];  Service: Endoscopy;  Laterality: N/A;  . Esophagogastroduodenoscopy  03/10/2012    Procedure: ESOPHAGOGASTRODUODENOSCOPY (EGD);  Surgeon: Rogene Houston, MD;  Location: [facility];  Service: Endoscopy;  Laterality: N/A;  EGD if TCS normal.  . Ligation of arteriovenous  fistula Left 03/24/2012    Procedure: LIGATION OF ARTERIOVENOUS  FISTULA;  Surgeon: Conrad Bluefield, MD;  Location: [place];  Service: Vascular;  Laterality: Left;  . Av fistula placement Right 04/28/2012    Procedure: INSERTION OF ARTERIOVENOUS (AV) GORE-TEX GRAFT ARM;  Surgeon: Elam Dutch, MD;  Location: [facility] OR;  Service: Vascular;  Laterality: Right;  . Leg amputation above knee Left   . Angioplast of lle  12/2012-01/2013    Several Procedures  . Esophagogastroduodenoscopy N/A 02/14/2013    Procedure: ESOPHAGOGASTRODUODENOSCOPY (EGD);  Surgeon: Milus Banister, MD;  Location: [place];  Service: Endoscopy;  Laterality: N/A;  . Cardiac catheterization  03/08/2004    Normal coronary arteries and normal LV function. Normal aortoiliac anatomy suitable  for renal transplant.  . Transthoracic echocardiogram  02/07/2004    EF 60%, no evidence of significant valvular abnormalities.  . Finger amputation Right     middle  . Angioplasty rll  12/2012, 2015    at [facility]  . Angioplasty r hand  12/2012, 2015    at [place]. Jimmye Norman, RD, LDN Pager: [phone]

## 2013-06-01 NOTE — Consult Note (Addendum)
Katrina Meyer MRN: [id number] DOB/AGE: 07/05/1932 80 y.o. Primary Care Physician:VYAS,DHRUV B., MD Admit date: 05/31/2013 Chief Complaint:  Chief Complaint  Patient presents with  . Altered Mental Status   HPI: Pt is 80 year old female with past medical hx of ESRD who was brought to ER with c/o AMS.  HPI from pt family and medical records.  HPI dates back to last evening  when pt was found to be drooling by grand daughter . As per grand daughter pt when she sent to dialysis was at her baseline but after HD pt was lethargic. As granddaughter no other complaints  NO c/o chest pain NO c/o fever/cough/chills NO c/o hematemesis NO c/o Blood in stools. NO c/o nausea/ vomiting/diarrhea    Past Medical History  Diagnosis Date  . Hyperlipidemia     takes Lipitor daily  . DVT (deep venous thrombosis)   . Stroke   . Parkinson'Katrina disease     takes Sinemet daily  . Tibia/fibula fracture 01/04/2012    Treated with long leg casting. Cast change in hospital 02/27/12   . Peripheral vascular disease 02/27/2012    Skin ulcer of right great toe  . Atrial fibrillation 03/09/2012    Seen on older EKG as well but family not aware.   . Macrocytic anemia 03/09/2012  . Lower GI bleed 03/10/2012    hematochezia secondary to ulcerated polyps; diverticulosis; chronic diarrhea  . End stage renal disease on dialysis     [facility]- MWF  . Hypertension     takes Coreg daily  . Dementia     takes Aricept daily  . Diabetes mellitus, type II     takes Lantus and Novolog  . Seasonal allergies     takes Allegra daily  . Depression     takes Prozac daily  . Hypothyroidism     takes Synthroid daily       Family History  Problem Relation Age of Onset  . Stroke Mother   . Diabetes Mother   . Cancer Mother   . Hypertension Mother   . Coronary artery disease Father   . Diabetes Sister    Social History:  reports that she has been passively smoking Cigarettes.  She has been smoking about 0.00  packs per day. She has never used smokeless tobacco. She reports that she does not drink alcohol or use illicit drugs.   Allergies:  Allergies  Allergen Reactions  . Hydralazine Rash  . Hydrochlorothiazide Hives and Rash    Medications Prior to Admission  Medication Sig Dispense Refill  . atorvastatin (LIPITOR) 10 MG tablet Take 10 mg by mouth every morning.       . carbidopa-levodopa (SINEMET IR) 25-100 MG per tablet Take 1.5 tablets by mouth 3 (three) times daily.      . carvedilol (COREG) 6.25 MG tablet Take 18.75 mg by mouth 2 (two) times daily with a meal. Does not take morning dose on dialysis days (M-W-F)      . cinacalcet (SENSIPAR) 30 MG tablet Take 30 mg by mouth every evening.       . clopidogrel (PLAVIX) 75 MG tablet Take 75 mg by mouth daily.      . collagenase (SANTYL) ointment Apply 1 application topically daily.      Marland Kitchen donepezil (ARICEPT) 10 MG tablet Take 10 mg by mouth at bedtime.       Marland Kitchen FLUoxetine (PROZAC) 40 MG capsule Take 40 mg by mouth every morning.       Marland Kitchen  GEBAUERS SPRAY AND STRETCH AERO Apply 1 application topically daily. Uses at Dialysis treatments      . HYDROcodone-acetaminophen (NORCO/VICODIN) 5-325 MG per tablet Take 1 tablet by mouth every 4 (four) hours as needed. For pain      . insulin glargine (LANTUS) 100 UNIT/ML injection Inject 1-5 Units into the skin at bedtime as needed. Per Sliding Scale      . insulin lispro (HUMALOG) 100 UNIT/ML injection Inject 1-4 Units into the skin 3 (three) times daily with meals. 200-250= 1 unit 251-300= 2 unit 301-350= 3 unit 351-400= 4 unit      . levofloxacin (LEVAQUIN) 500 MG tablet Take 500 mg by mouth daily. 14 day course starting on 05/30/2013      . levothyroxine (SYNTHROID, LEVOTHROID) 25 MCG tablet Take 25 mcg by mouth every morning.      . Maltodextrin-Xanthan Gum (RESOURCE THICKENUP CLEAR) POWD To thicken liquids to nectar thick  1 Can  2  . multivitamin (RENA-VIT) TABS tablet Take 1 tablet by mouth 2 (two)  times daily.       . Nutritional Supplements (FEEDING SUPPLEMENT, NEPRO CARB STEADY,) LIQD Take 237 mLs by mouth daily.  1 Can  0  . Vitamin D, Ergocalciferol, (DRISDOL) 50000 UNITS CAPS capsule Take 1 capsule by mouth once a week.           ZOX:WRUEA from the symptoms mentioned above,there are no other symptoms referable to all systems reviewed.  Marland Kitchen atorvastatin  10 mg Oral q morning - 10a  . carbidopa-levodopa  1.5 tablet Oral TID  . donepezil  10 mg Oral QHS  . feeding supplement (NEPRO CARB STEADY)  237 mL Oral Q24H  . insulin aspart  0-5 Units Subcutaneous QHS  . insulin aspart  0-9 Units Subcutaneous TID WC  . levofloxacin (LEVAQUIN) IV  500 mg Intravenous Q24H  . metoprolol  5 mg Intravenous 4 times per day  . multivitamin  1 tablet Oral BID  . pentafluoroprop-tetrafluoroeth  1 application Topical Daily  . piperacillin-tazobactam (ZOSYN)  IV  2.25 g Intravenous Q8H  . sodium chloride  3 mL Intravenous Q12H  . sodium chloride  3 mL Intravenous Q12H  . Vitamin D (Ergocalciferol)  50,000 Units Oral Weekly      Physical Exam: Vital signs in last 24 hours: Temp:  [97.9 F (36.6 C)-100.1 F (37.8 C)] 97.9 F (36.6 C) (04/28 0801) Pulse Rate:  [108-124] 124 (04/28 0801) Resp:  [15-17] 16 (04/28 0801) BP: (71-138)/(48-71) 138/71 mmHg (04/28 0801) SpO2:  [95 %-99 %] 97 % (04/28 0801) Weight:  [118 lb 2.7 oz (53.6 kg)-120 lb 5.9 oz (54.6 kg)] 118 lb 2.7 oz (53.6 kg) (04/28 0413) Weight change:  Last BM Date: 05/31/13   Physical Exam: General- pt is awake,but does not follow comands Resp- No acute REsp distress, CTA B/L NO Rhonchi CVS- S1S2 Irregular in rate and rhythm GIT- BS+, soft, NT, ND EXT- NO LE Edema, left AKA CNS- pt is  Moving all 4 extremities Psych-unable to assessAccess- AVF   Lab Results: CBC  Recent Labs  05/31/13 1827  WBC 19.9*  HGB 12.8  HCT 38.4  PLT 295    BMET  Recent Labs  05/31/13 1827  NA 145  K 3.5*  CL 98  CO2 30  GLUCOSE  109*  BUN 7  CREATININE 2.33*  CALCIUM 10.7*    MICRO Recent Results (from the past 240 hour(Katrina))  CULTURE, BLOOD (ROUTINE X 2)     Status: None  Collection Time    05/31/13  6:27 PM      Result Value Ref Range Status   Specimen Description BLOOD LEFT ANTECUBITAL   Final   Special Requests     Final   Value: BOTTLES DRAWN AEROBIC AND ANAEROBIC 6CC  IMMUNE:COMPROMISED   Culture NO GROWTH 1 DAY   Final   Report Status PENDING   Incomplete  CULTURE, BLOOD (ROUTINE X 2)     Status: None   Collection Time    05/31/13  7:13 PM      Result Value Ref Range Status   Specimen Description BLOOD LEFT HAND   Final   Special Requests BOTTLES DRAWN AEROBIC ONLY 4CC  IMMUNE:COMPROMISED   Final   Culture NO GROWTH 1 DAY   Final   Report Status PENDING   Incomplete      Lab Results  Component Value Date   CALCIUM 10.7* 05/31/2013   CAION 1.15 03/02/2013   PHOS 3.7 03/05/2013      Impression: 1)Renal ESRD on HD On MWF schedule NO need of hd today Will dialyze in am    2)HTN  stable Medication- On Beta blockers  3)Anemia HGb at goal (9--11)   4)CKD Mineral-Bone Disorder Phosphorus at goal. Hypercalcemia sec to Vitamin D   5)CNs-admitted with AMS PMD following  6)FEN  Normokalemic Hyponatremic   Secondary to ESRD  7)Acid base Co2 at goal  8) ID- sepsis  High WBC. + tachy cardiac + AMS Cultures pending  On vanco + Zosyn  Plan:  No need of Hd today Will folow closley will need hd in am       Katrina Meyer Katrina Meyer 06/01/2013, 1:07 PM

## 2013-06-01 NOTE — Progress Notes (Signed)
CRITICAL VALUE ALERT  Critical value received:  Troponin 0.48 Date of notification:  06/01/2013  Time of notification:  0712  Critical value read back:yes  Nurse who received alert:  Leroy Kennedy  MD notified (1st page):  Memon  Time of first page:  0713  MD notified (2nd page):  Time of second page:  Responding MD: Roderic Palau  Time MD responded:  [phone]

## 2013-06-01 NOTE — Progress Notes (Signed)
Dr Roderic Palau notified on EKG results.  Pt unable to speak but is awake and able to follow simple commands.

## 2013-06-01 NOTE — Progress Notes (Signed)
ANTIBIOTIC CONSULT NOTE  Pharmacy Consult for Vancomycin and Zosyn  Indication: rule out sepsis, wound infection  Allergies  Allergen Reactions  . Hydralazine Rash  . Hydrochlorothiazide Hives and Rash    Patient Measurements: Height: 5' 6.93" (170 cm) Weight: 118 lb 2.7 oz (53.6 kg) IBW/kg (Calculated) : 61.44  Vital Signs: Temp: 97.8 F (36.6 C) (04/28 1419) Temp src: Oral (04/28 1419) BP: 132/70 mmHg (04/28 1419) Pulse Rate: 118 (04/28 1419) Intake/Output from previous day:   Intake/Output from this shift: Total I/O In: 120 [P.O.:120] Out: -   Labs:  Recent Labs  05/31/13 1827  WBC 19.9*  HGB 12.8  PLT 295  CREATININE 2.33*   Estimated Creatinine Clearance: 16.8 ml/min (by C-G formula based on Cr of 2.33). No results found for this basename: VANCOTROUGH, Corlis Leak, VANCORANDOM, GENTTROUGH, GENTPEAK, GENTRANDOM, TOBRATROUGH, TOBRAPEAK, TOBRARND, AMIKACINPEAK, AMIKACINTROU, AMIKACIN,  in the last 72 hours   Microbiology: Recent Results (from the past 720 hour(s))  CULTURE, BLOOD (ROUTINE X 2)     Status: None   Collection Time    05/31/13  6:27 PM      Result Value Ref Range Status   Specimen Description BLOOD LEFT ANTECUBITAL   Final   Special Requests     Final   Value: BOTTLES DRAWN AEROBIC AND ANAEROBIC 6CC  IMMUNE:COMPROMISED   Culture NO GROWTH 1 DAY   Final   Report Status PENDING   Incomplete  CULTURE, BLOOD (ROUTINE X 2)     Status: None   Collection Time    05/31/13  7:13 PM      Result Value Ref Range Status   Specimen Description BLOOD LEFT HAND   Final   Special Requests BOTTLES DRAWN AEROBIC ONLY 4CC  IMMUNE:COMPROMISED   Final   Culture NO GROWTH 1 DAY   Final   Report Status PENDING   Incomplete    Medical History: Past Medical History  Diagnosis Date  . Hyperlipidemia     takes Lipitor daily  . DVT (deep venous thrombosis)   . Stroke   . Parkinson's disease     takes Sinemet daily  . Tibia/fibula fracture 01/04/2012    Treated  with long leg casting. Cast change in hospital 02/27/12   . Peripheral vascular disease 02/27/2012    Skin ulcer of right great toe  . Atrial fibrillation 03/09/2012    Seen on older EKG as well but family not aware.   . Macrocytic anemia 03/09/2012  . Lower GI bleed 03/10/2012    hematochezia secondary to ulcerated polyps; diverticulosis; chronic diarrhea  . End stage renal disease on dialysis     [facility]- MWF  . Hypertension     takes Coreg daily  . Dementia     takes Aricept daily  . Diabetes mellitus, type II     takes Lantus and Novolog  . Seasonal allergies     takes Allegra daily  . Depression     takes Prozac daily  . Hypothyroidism     takes Synthroid daily    Medications:  Scheduled:  . atorvastatin  10 mg Oral q morning - 10a  . carbidopa-levodopa  1.5 tablet Oral TID  . donepezil  10 mg Oral QHS  . feeding supplement (NEPRO CARB STEADY)  237 mL Oral Q24H  . insulin aspart  0-5 Units Subcutaneous QHS  . insulin aspart  0-9 Units Subcutaneous TID WC  . metoprolol  5 mg Intravenous 4 times per day  . multivitamin  1  tablet Oral BID  . pentafluoroprop-tetrafluoroeth  1 application Topical Daily  . piperacillin-tazobactam (ZOSYN)  IV  2.25 g Intravenous Q8H  . sodium chloride  3 mL Intravenous Q12H  . sodium chloride  3 mL Intravenous Q12H  . [START ON 06/02/2013] vancomycin  500 mg Intravenous Q M,W,F-HD  . Vitamin D (Ergocalciferol)  50,000 Units Oral Weekly   Assessment: 80 yo F with ESRD (HD on MWF).  She was admitted with fever of unknown origin.  WBC remains elevated, but she has been afebrile today.  Plans for HD in am. Empiric antibiotics started on admission.  Cx data pending.   Zosyn 4/27 >> Vancomycin 4/27 >>  Goal of Therapy:  Pre-Hemodialysis Vancomycin level goal range =15-25 mcg/ml. Eradicate infection.   Plan:  Vancomycin 500mg  IV after each HD Zosyn 2.25gm IV every 8 hours. Measure antibiotic drug levels at steady state Follow up culture  results  Biagio Borg 06/01/2013,4:52 PM

## 2013-06-01 NOTE — Progress Notes (Signed)
Patient seen and examined. Above note reviewed.  This is a complex 80 year old female who has a history of end-stage renal disease on hemodialysis. She was admitted to the hospital with altered mental status and hypotension post dialysis. The patient has similar presentation in 03/2013 when she was admitted at [facility]. Her clinical condition responded to IV fluids and her mental status improved at that time. The patient has been started on IV fluids. Her blood pressure has improved. Patient is awake, but does not engage in conversation. MRI of the brain is negative for stroke. She is being treated for a possible underlying sepsis with her hypotension and significant leukocytosis. She also did have a low-grade temperature. Source of the sepsis is not entirely clear. She is on broad-spectrum antibiotics. Blood cultures are currently in process. Possible sources include wound infections from her decubitus ulcers. Wound care consult has been requested. Other etiologies to her altered mental status include possible seizures. EEG has been ordered as has neurology consultation. She does not appear to be in any medications that would cause her alteration in mental status. The patient has rapid atrial fibrillation and is receiving scheduled intravenous Lopressor. Swallow evaluation was attempted today, per patient did not swallow any food and repeatedly "pocket" her food. This will have to be readdressed once her mental status has improved, she is n.p.o. for now.  [employer]

## 2013-06-01 NOTE — Progress Notes (Signed)
Pt back from MRI and family attempting to feed pt.  Pt has a slow response to swallow and has to be cued to swallow.  Held food in her mouth for several minutes before swallowing and appeared to be hard to swallow from the look on her face.  Pt still unable to speak and family is also concern about her swallowing.  Do not see any results from a bedside swallow study and have notified NP of concern.

## 2013-06-01 NOTE — Progress Notes (Signed)
*  PRELIMINARY RESULTS* Echocardiogram 2D Echocardiogram has been performed.  Katrina Meyer 06/01/2013, 3:14 PM

## 2013-06-01 NOTE — Progress Notes (Signed)
Utilization Review Complete  

## 2013-06-01 NOTE — Care Management Note (Addendum)
    Page 1 of 1   06/04/2013     1:47:09 PM CARE MANAGEMENT NOTE 06/04/2013  Patient:  Katrina Meyer, Katrina Meyer   Account Number:  [id number]  Date Initiated:  06/01/2013  Documentation initiated by:  Claretha Cooper  Subjective/Objective Assessment:   Pt admitted from home. G'daughter is caregiver and states that her current condition is not her normal. Currently [facility] provides RN,  PT, and aid service.     Action/Plan:   Will follow to see if pt is able to return home with [facility] or will need placement   Anticipated DC Date:     Anticipated DC Plan:  [place]  CM consult      Choice offered to / List presented to:          [facility] arranged  HH-1 RN  [place]   Status of service:  Completed, signed off Medicare Important Message given?  YES (If response is "NO", the following Medicare IM given date fields will be blank) Date Medicare IM given:  06/04/2013 Date Additional Medicare IM given:    Discharge Disposition:    Per UR Regulation:    If discussed at Long Length of Stay Meetings, dates discussed:    Comments:  06/04/13 Claretha Cooper RN BSN CM lives with daughter who is at bedside. per daughter, she has sitters 24/7. Uses [facility] for [facility] RN and would prefer outpt PT. CM discussed with daughter that if pt is able to leave for outpt PT, she would not be eligible for [facility] RN. Agreed pt will initially be  home bound and could benefit from [facility] RN and PT  06/01/13 Claretha Cooper RN BSN CM

## 2013-06-01 NOTE — Progress Notes (Signed)
[facility] PROGRESS NOTE  Katrina Meyer MVH:[id number] DOB: June 01, 1932 DOA: 05/31/2013 PCP: Glenda Chroman., MD  Assessment/Plan:  Acute encephalopathy: etiology unclear. May be multifactorial ie. Hypovolemia related to dialysis, infectious process from skin ulcers , neuro event. Improved this am but remains non-verbal. MRI results pending. Afebrile. Blood cultures x2 pending esr 37. TSH,RPR and B12 pending.  Will continue gentle fluids and vanc and zoxyn day #2.   Hypotension: resolved with ns iv. Troponin 0.48, may be related to dialysis. No events on tele.  Await echo.  Afib: Hx of same.  uncontrolled rate this am. Had not had meds. Home meds include coreg, plavix. Pt NPO this am due to dysphagia. Will use Heparin for anticoag. Given cardizem IV for RVR. Rate down to 95. Will provide IV BB for rate control until able to take po.   Skin ulcers: wound consult.   ESRD on HD M, W, F :await nephrology assistance.    Dm2: A1c pending. Will use SSI for optimal control.   DVT prophylaxis: scd   Code Status:  Family Communication: granddaughter at bedside  Disposition Plan: home when ready   Consultants:  nephrology  Procedures:  echo  Antibiotics:  Vancomycin 05/31/13>.  Zosyn 05/31/13>>  HPI/Subjective: Alert, non-verbal. Will track with eyes. No indication.  Objective: Filed Vitals:   06/01/13 0801  BP: 138/71  Pulse: 124  Temp: 97.9 F (36.6 C)  Resp: 16   No intake or output data in the 24 hours ending 06/01/13 1011 Filed Weights   05/31/13 1738 05/31/13 2212 06/01/13 0413  Weight: 54.6 kg (120 lb 5.9 oz) 53.9 kg (118 lb 13.3 oz) 53.6 kg (118 lb 2.7 oz)    Exam:   General:  Well nourished NAD  Cardiovascular: irregularly irregular no murmur no gallup  Respiratory: normal effort somewhat shallow fair air flow  Abdomen: soft non-distended +BS non-tender  Musculoskeletal: no clubbing no cyanosis  Skin: dark eschar to right heel. Dressing to right  hand dry and intact.    Data Reviewed: Basic Metabolic Panel:  Recent Labs Lab 05/31/13 1827  NA 145  K 3.5*  CL 98  CO2 30  GLUCOSE 109*  BUN 7  CREATININE 2.33*  CALCIUM 10.7*   Liver Function Tests:  Recent Labs Lab 05/31/13 1831  AST 20  ALT 5  ALKPHOS 190*  BILITOT 0.6  PROT 5.7*  ALBUMIN 1.6*   No results found for this basename: LIPASE, AMYLASE,  in the last 168 hours  Recent Labs Lab 05/31/13 2140  AMMONIA 10*   CBC:  Recent Labs Lab 05/31/13 1827  WBC 19.9*  NEUTROABS 16.0*  HGB 12.8  HCT 38.4  MCV 93.4  PLT 295   Cardiac Enzymes:  Recent Labs Lab 05/31/13 1827 05/31/13 2040 06/01/13 0603  CKTOTAL  --  36  --   CKMB  --  2.3  --   TROPONINI <0.30  --  0.48*   BNP (last 3 results)  Recent Labs  03/02/13 1645  PROBNP 13840.0*   CBG:  Recent Labs Lab 05/31/13 1732 05/31/13 2347 06/01/13 0737  GLUCAP 82 144* 120*    No results found for this or any previous visit (from the past 240 hour(s)).   Studies: Ct Head Wo Contrast  05/31/2013   CLINICAL DATA:  Altered mental status  EXAM: CT HEAD WITHOUT CONTRAST  TECHNIQUE: Contiguous axial images were obtained from the base of the skull through the vertex without intravenous contrast.  COMPARISON:  Prior CT from 03/02/2013  FINDINGS: Diffuse prominence of the CSF containing spaces is compatible with generalized cerebral atrophy. Scattered and confluent hypodensity within the periventricular and deep white matter of both cerebral hemispheres is most compatible with moderate chronic microvascular ischemic changes. Remote left basal ganglia infarcts noted, unchanged.  There is no acute intracranial hemorrhage or infarct. No mass lesion or midline shift. Gray-white matter differentiation is well maintained. Ventricles are normal in size without evidence of hydrocephalus. CSF containing spaces are within normal limits. No extra-axial fluid collection.  The calvarium is intact.  Orbital soft  tissues are within normal limits.  The paranasal sinuses and mastoid air cells are well pneumatized and free of fluid.  Scalp soft tissues are unremarkable.  IMPRESSION: 1. No acute intracranial process. 2. Stable atrophy with moderate chronic microvascular ischemic disease involving the supratentorial white matter. 3. Remote left basal ganglia infarct.   Electronically Signed   By: Jeannine Boga M.D.   On: 05/31/2013 19:21   Mr Brain Wo Contrast  06/01/2013   CLINICAL DATA:  80 year old female with altered mental status and weakness. Confusion. Initial encounter.  EXAM: MRI HEAD WITHOUT CONTRAST  TECHNIQUE: Multiplanar, multiecho pulse sequences of the brain and surrounding structures were obtained without intravenous contrast.  COMPARISON:  Head CT without contrast 05/31/2013 and earlier.  FINDINGS: Stable cerebral volume. No midline shift, mass effect, evidence of mass lesion, ventriculomegaly, extra-axial collection or acute intracranial hemorrhage. Cervicomedullary junction and pituitary are within normal limits. Negative visualized cervical spine. Major intracranial vascular flow voids are stable.  Widespread abnormal T2 and FLAIR hyperintensity in the brain, most resembling sequelae of chronic small vessel disease. There are small chronic infarcts in the cerebellum (right medial cerebellar hemisphere most affected). Evidence of a chronic cortically based infarct in the right frontal operculum noted (series 5 image 16). Chronic lacunar infarct extending from the left corona radiata to the lentiform nuclei. Occasional trace chronic blood products.  On diffusion-weighted imaging today there is a small focus of increased trace diffusion signal in the right gyrus rectus (series 100, image 14 and series 101, image 29), not correlated on ADC. No associated T2 or FLAIR hyperintensity. Burtis Junes this is artifact.  Trace mastoid fluid. Minor paranasal sinus mucosal thickening. Visualized orbit soft tissues are  within normal limits. Visualized scalp soft tissues are within normal limits. Normal bone marrow signal.  IMPRESSION: 1. No definite acute intracranial abnormality. A 4 mm focus of increased trace diffusion signal in the right gyrus rectus is felt to be artifact. 2. Chronic  small and medium-sized vessel ischemia.   Electronically Signed   By: Lars Pinks M.D.   On: 06/01/2013 09:35   Dg Chest Portable 1 View  05/31/2013   CLINICAL DATA:  Altered mental status; history of previous CVA as well as Parkinson's disease, cardiac dysrhythmia, diabetes and dialysis dependent renal failure  EXAM: PORTABLE CHEST - 1 VIEW  COMPARISON:  DG CHEST 1V PORT dated 03/07/2013  FINDINGS: The lungs are adequately inflated. There is no focal infiltrate. The cardiopericardial silhouette is mildly enlarged but stable. The pulmonary vascularity is not engorged. There is no significant pleural effusion demonstrated. There is tortuosity of the descending thoracic aorta. A vascular graft is present presumably in the right axilla.  IMPRESSION: There is no evidence of pneumonia nor CHF currently. There is mild stable enlargement of the cardiac silhouette.   Electronically Signed   By: David  Martinique   On: 05/31/2013 18:18    Scheduled Meds: . atorvastatin  10 mg Oral q  morning - 10a  . carbidopa-levodopa  1.5 tablet Oral TID  . carvedilol  18.75 mg Oral BID WC  . cinacalcet  30 mg Oral Q supper  . clopidogrel  75 mg Oral Q breakfast  . donepezil  10 mg Oral QHS  . feeding supplement (NEPRO CARB STEADY)  237 mL Oral Q24H  . FLUoxetine  40 mg Oral q morning - 10a  . insulin aspart  0-5 Units Subcutaneous QHS  . insulin aspart  0-9 Units Subcutaneous TID WC  . levofloxacin (LEVAQUIN) IV  500 mg Intravenous Q24H  . levothyroxine  25 mcg Oral QAC breakfast  . multivitamin  1 tablet Oral BID  . pentafluoroprop-tetrafluoroeth  1 application Topical Daily  . piperacillin-tazobactam (ZOSYN)  IV  2.25 g Intravenous Q8H  . sodium  chloride  3 mL Intravenous Q12H  . sodium chloride  3 mL Intravenous Q12H  . Vitamin D (Ergocalciferol)  50,000 Units Oral Weekly   Continuous Infusions:   Principal Problem:   Acute encephalopathy Active Problems:   Atrial fibrillation   Parkinson's disease   CKD (chronic kidney disease) stage V requiring chronic dialysis   Hypertension   Dementia   Altered mental status    Time spent: 34 minutes    [facility] Pager [phone]. If 7PM-7AM, please contact night-coverage at [email or web address], password [facility] 06/01/2013, 10:11 AM  LOS: 1 day

## 2013-06-02 ENCOUNTER — Other Ambulatory Visit (HOSPITAL_COMMUNITY): Payer: Medicare Other

## 2013-06-02 ENCOUNTER — Inpatient Hospital Stay (HOSPITAL_COMMUNITY): Payer: Medicare Other

## 2013-06-02 DIAGNOSIS — L89109 Pressure ulcer of unspecified part of back, unspecified stage: Secondary | ICD-10-CM

## 2013-06-02 DIAGNOSIS — R7989 Other specified abnormal findings of blood chemistry: Secondary | ICD-10-CM | POA: Diagnosis present

## 2013-06-02 DIAGNOSIS — D72829 Elevated white blood cell count, unspecified: Secondary | ICD-10-CM | POA: Diagnosis present

## 2013-06-02 DIAGNOSIS — L03119 Cellulitis of unspecified part of limb: Secondary | ICD-10-CM

## 2013-06-02 DIAGNOSIS — R778 Other specified abnormalities of plasma proteins: Secondary | ICD-10-CM | POA: Diagnosis present

## 2013-06-02 DIAGNOSIS — E162 Hypoglycemia, unspecified: Secondary | ICD-10-CM | POA: Diagnosis not present

## 2013-06-02 DIAGNOSIS — L8992 Pressure ulcer of unspecified site, stage 2: Secondary | ICD-10-CM

## 2013-06-02 DIAGNOSIS — L02612 Cutaneous abscess of left foot: Secondary | ICD-10-CM | POA: Diagnosis present

## 2013-06-02 DIAGNOSIS — L02619 Cutaneous abscess of unspecified foot: Secondary | ICD-10-CM

## 2013-06-02 DIAGNOSIS — K921 Melena: Secondary | ICD-10-CM | POA: Diagnosis not present

## 2013-06-02 LAB — GLUCOSE, CAPILLARY
GLUCOSE-CAPILLARY: 115 mg/dL — AB (ref 70–99)
GLUCOSE-CAPILLARY: 76 mg/dL (ref 70–99)
Glucose-Capillary: 57 mg/dL — ABNORMAL LOW (ref 70–99)
Glucose-Capillary: 113 mg/dL — ABNORMAL HIGH (ref 70–99)

## 2013-06-02 LAB — CBC
HCT: 40.1 % (ref 36.0–46.0)
HEMOGLOBIN: 13.1 g/dL (ref 12.0–15.0)
MCH: 30.3 pg (ref 26.0–34.0)
MCHC: 32.7 g/dL (ref 30.0–36.0)
MCV: 92.8 fL (ref 78.0–100.0)
Platelets: 247 10*3/uL (ref 150–400)
RBC: 4.32 MIL/uL (ref 3.87–5.11)
RDW: 17 % — ABNORMAL HIGH (ref 11.5–15.5)
WBC: 16.7 10*3/uL — ABNORMAL HIGH (ref 4.0–10.5)

## 2013-06-02 LAB — BASIC METABOLIC PANEL
BUN: 17 mg/dL (ref 6–23)
CO2: 29 mEq/L (ref 19–32)
Calcium: 10.9 mg/dL — ABNORMAL HIGH (ref 8.4–10.5)
Chloride: 101 mEq/L (ref 96–112)
Creatinine, Ser: 3.49 mg/dL — ABNORMAL HIGH (ref 0.50–1.10)
GFR calc Af Amer: 13 mL/min — ABNORMAL LOW (ref >90)
GFR calc non Af Amer: 12 mL/min — ABNORMAL LOW (ref >90)
Glucose, Bld: 76 mg/dL (ref 70–99)
Potassium: 3.9 mEq/L (ref 3.7–5.3)
Sodium: 147 mEq/L (ref 137–147)

## 2013-06-02 LAB — CLOSTRIDIUM DIFFICILE BY PCR: Toxigenic C. Difficile by PCR: NEGATIVE

## 2013-06-02 LAB — OCCULT BLOOD X 1 CARD TO LAB, STOOL: Fecal Occult Bld: POSITIVE — AB

## 2013-06-02 LAB — HIV ANTIBODY (ROUTINE TESTING W REFLEX): HIV: NONREACTIVE

## 2013-06-02 MED ORDER — DIGOXIN 0.25 MG/ML IJ SOLN
0.1250 mg | Freq: Once | INTRAMUSCULAR | Status: AC
  Administered 2013-06-02: 0.125 mg via INTRAVENOUS
  Filled 2013-06-02: qty 2

## 2013-06-02 MED ORDER — PENTAFLUOROPROP-TETRAFLUOROETH EX AERO
1.0000 "application " | INHALATION_SPRAY | CUTANEOUS | Status: DC | PRN
  Filled 2013-06-02: qty 103.5

## 2013-06-02 MED ORDER — LIDOCAINE HCL (PF) 1 % IJ SOLN: 5.0000 mL | INTRAMUSCULAR | Status: DC | PRN

## 2013-06-02 MED ORDER — SODIUM CHLORIDE 0.9 % IV BOLUS (SEPSIS)
250.0000 mL | Freq: Once | INTRAVENOUS | Status: AC
  Administered 2013-06-02: 250 mL via INTRAVENOUS

## 2013-06-02 MED ORDER — DEXTROSE 50 % IV SOLN
INTRAVENOUS | Status: AC
  Administered 2013-06-02: 50 mL
  Filled 2013-06-02: qty 50

## 2013-06-02 MED ORDER — HEPARIN SODIUM (PORCINE) 1000 UNIT/ML DIALYSIS
1000.0000 [IU] | INTRAMUSCULAR | Status: DC | PRN
  Filled 2013-06-02 (×2): qty 1

## 2013-06-02 MED ORDER — SODIUM CHLORIDE 0.9 % IV SOLN: INTRAVENOUS | Status: DC

## 2013-06-02 MED ORDER — LIDOCAINE-PRILOCAINE 2.5-2.5 % EX CREA: 1.0000 "application " | TOPICAL_CREAM | CUTANEOUS | Status: DC | PRN

## 2013-06-02 MED ORDER — MIDODRINE HCL 5 MG PO TABS
10.0000 mg | ORAL_TABLET | ORAL | Status: DC
  Administered 2013-06-04: 10 mg via ORAL
  Filled 2013-06-02 (×3): qty 2

## 2013-06-02 MED ORDER — SODIUM CHLORIDE 0.9 % IV SOLN: 100.0000 mL | INTRAVENOUS | Status: DC | PRN

## 2013-06-02 MED ORDER — DEXTROSE 50 % IV SOLN: 25.0000 mL | Freq: Once | INTRAVENOUS | Status: AC | PRN

## 2013-06-02 MED ORDER — ALTEPLASE 2 MG IJ SOLR
2.0000 mg | Freq: Once | INTRAMUSCULAR | Status: AC | PRN
  Filled 2013-06-02: qty 2

## 2013-06-02 MED ORDER — COLLAGENASE 250 UNIT/GM EX OINT
TOPICAL_OINTMENT | Freq: Every day | CUTANEOUS | Status: DC
  Administered 2013-06-02 – 2013-06-04 (×3): via TOPICAL
  Administered 2013-06-05 – 2013-06-06 (×2): 1 via TOPICAL
  Administered 2013-06-08: 12:00:00 via TOPICAL
  Administered 2013-06-09 – 2013-06-10 (×2): 1 via TOPICAL
  Administered 2013-06-11: 17:00:00 via TOPICAL
  Administered 2013-06-12 – 2013-06-13 (×2): 1 via TOPICAL
  Administered 2013-06-17: 10:00:00 via TOPICAL
  Filled 2013-06-02 (×4): qty 30

## 2013-06-02 MED ORDER — PANTOPRAZOLE SODIUM 40 MG IV SOLR
40.0000 mg | INTRAVENOUS | Status: DC
  Administered 2013-06-02 – 2013-06-06 (×5): 40 mg via INTRAVENOUS
  Filled 2013-06-02 (×7): qty 40

## 2013-06-02 MED ORDER — HEPARIN SODIUM (PORCINE) 1000 UNIT/ML DIALYSIS
20.0000 [IU]/kg | INTRAMUSCULAR | Status: DC | PRN
  Administered 2013-06-02 – 2013-06-04 (×2): 1100 [IU] via INTRAVENOUS_CENTRAL
  Administered 2013-06-17: 1000 [IU] via INTRAVENOUS_CENTRAL
  Filled 2013-06-02: qty 2

## 2013-06-02 MED ORDER — METOPROLOL TARTRATE 1 MG/ML IV SOLN
5.0000 mg | INTRAVENOUS | Status: DC
  Filled 2013-06-02: qty 5

## 2013-06-02 MED ORDER — NEPRO/CARBSTEADY PO LIQD: 237.0000 mL | ORAL | Status: DC | PRN

## 2013-06-02 NOTE — Progress Notes (Signed)
The patient was seen and examined. She was discussed with nurse practitioner, Ms. Renard Hamper. Agree with findings and assessment with additions below. The etiology of her encephalopathy is unclear. She is alert and semi-responsive per my exam. Per exam, she opens her eyes and gives good eye contact. She squeezes the examiner's hand with her left hand. Otherwise she is mute. She has no obvious facial droop or cranial nerve deficits otherwise. She may have underlying dementia, but her baseline is unclear at this time. This will need to be investigated with her family. EEG is pending to assess for seizure disorder. Dr. [name] has evaluated the patient and made his assessment and recommendations; I appreciate his input.  [name] white blood cell count has trended downward. We'll continue vancomycin and Zosyn for treatment of possible infected wounds. Wound care consult noted and appreciated. Given the findings by the wound care nurse, we'll order MRI of the right heel to rule out a deeper infection. However, in light of the large melanotic stool that I observed on exam, C. difficile colitis is being assessed. Her CBC or hemoglobin/hematocrit will be followed daily. Will Hemoccult stool. We'll add Protonix.  Will order a followup troponin I. in light of the one isolated elevation. 2-D echocardiogram results noted below.  2-D echocardiogram: Study Conclusions - Procedure narrative: Patient in rapid atrial fibrillation throughout study. - Left ventricle: Vigorous systolic function with obliteration of the LV cavity, EF > 70%. The cavity size was mildly reduced. Wall thickness was increased in a pattern of moderate LVH. Wall motion was normal; there were no regional wall motion abnormalities. The study was not technically sufficient to allow evaluation of LV diastolic dysfunction due to atrial fibrillation. - Aortic valve: Peak velocities and mean gradients were not obtained. There appears to be at least  mild aortic stenosis. Mildly calcified annulus. Trileaflet; moderately thickened, mildly calcified leaflets. Valve mobility was mildly restricted. Mild regurgitation. - Mitral valve: Calcified annulus. Moderately thickened leaflets . - Left atrium: The atrium was moderately dilated. - Tricuspid valve: Mildly thickened leaflets. Mild regurgitation.

## 2013-06-02 NOTE — Consult Note (Signed)
Vinton A. [name], MD     [email or web address]          Katrina Meyer is an 80 y.o. female.   ASSESSMENT/PLAN: 1. Unexplained spell of unresponsiveness with spontaneous improvement. She appears to have residual mutism of unclear etiology. This could be due to the effect of old stroke with recurrent symptoms and/or seizures. The spell was associated with hypotension which may be The culprit. 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 An EEG will be obtained. Dementia labs will be obtained.  2. Hypotension of unclear etiology.  3. Old the infarcts seen on imaging both cortical and subcortical associated with petechial hemorrhage.  4. Cognitive impairment at baseline.  This is 80 year old black female who has cognitive impairment at baseline. The patient gets hemodialysis Monday winces and Fridays. She was found to be unresponsive slumped over in a chair. She was hypotensive in the 72Z systolic. She was rapidly hydrated with improvement in symptoms. She had another spell like this last year and was worked up at the hospital in [place]. Imaging and EEG were both done and unremarkable. It appears that since this event, she has not been able to talk. The grandmother reports typically talks well and functions recently although she does have some memory impairment at baseline. She continues to remain what appears to be mute. There is some frustration with the patient not been able to talk.  GENERAL:  Thin female in no acute distress.  HEENT: Supple. Atraumatic normocephalic.   ABDOMEN: soft  EXTREMITIES: No edema. Left above-the-knee amputation. Right hand in bandage with application of middle finger.   BACK: Normal.  SKIN: Normal by inspection.    MENTAL STATUS: She is awake and alert. She attempts to speak but is essentially mute. She does mouth a few words on naming of objects. She follows commands well.  CRANIAL  NERVES: Pupils are equal, round and reactive to light and accommodation; extra ocular movements are full, there is no significant nystagmus; visual fields are full; upper and lower facial muscles are normal in strength and symmetric, there is no flattening of the nasolabial folds; tongue is midline; uvula is midline; shoulder elevation is normal.  MOTOR: Right lower extremity is weak duue to pain 3/5. Other extremities are 5/5.  COORDINATION: Left finger to nose is normal, right finger to nose is normal, No rest tremor; no intention tremor; no postural tremor; no bradykinesia.  REFLEXES: Deep tendon reflexes are symmetrical and normal.   SENSATION: Unreliable.      Past Medical History  Diagnosis Date  . Hyperlipidemia     takes Lipitor daily  . DVT (deep venous thrombosis)   . Stroke   . Parkinson's disease     takes Sinemet daily  . Tibia/fibula fracture 01/04/2012    Treated with long leg casting. Cast change in hospital 02/27/12   . Peripheral vascular disease 02/27/2012    Skin ulcer of right great toe  . Atrial fibrillation 03/09/2012    Seen on older EKG as well but family not aware.   . Macrocytic anemia 03/09/2012  . Lower GI bleed 03/10/2012    hematochezia secondary to ulcerated polyps; diverticulosis; chronic diarrhea  . End stage renal disease on dialysis     [facility]- MWF  . Hypertension     takes Coreg daily  . Dementia     takes Aricept daily  . Diabetes mellitus, type II     takes Lantus and  Novolog  . Seasonal allergies     takes Allegra daily  . Depression     takes Prozac daily  . Hypothyroidism     takes Synthroid daily    Past Surgical History  Procedure Laterality Date  . Dg av dialysis graft declot or    . Av fistula repair  Aug. 2013    Left arm  . Cholecystectomy    . Colon surgery      Aprrox 2006 at [facility] after she had a colonoscopy  . Colonoscopy  03/10/2012    Procedure: COLONOSCOPY;  Surgeon: Rogene Houston, MD;  Location: [facility]  ENDO SUITE;  Service: Endoscopy;  Laterality: N/A;  . Esophagogastroduodenoscopy  03/10/2012    Procedure: ESOPHAGOGASTRODUODENOSCOPY (EGD);  Surgeon: Rogene Houston, MD;  Location: [facility] ENDO SUITE;  Service: Endoscopy;  Laterality: N/A;  EGD if TCS normal.  . Ligation of arteriovenous  fistula Left 03/24/2012    Procedure: LIGATION OF ARTERIOVENOUS  FISTULA;  Surgeon: Conrad Cedar [place], MD;  Location: [place];  Service: Vascular;  Laterality: Left;  . Av fistula placement Right 04/28/2012    Procedure: INSERTION OF ARTERIOVENOUS (AV) GORE-TEX GRAFT ARM;  Surgeon: Elam Dutch, MD;  Location: [facility] OR;  Service: Vascular;  Laterality: Right;  . Leg amputation above knee Left   . Angioplast of lle  12/2012-01/2013    Several Procedures  . Esophagogastroduodenoscopy N/A 02/14/2013    Procedure: ESOPHAGOGASTRODUODENOSCOPY (EGD);  Surgeon: Milus Banister, MD;  Location: [place];  Service: Endoscopy;  Laterality: N/A;  . Cardiac catheterization  03/08/2004    Normal coronary arteries and normal LV function. Normal aortoiliac anatomy suitable for renal transplant.  . Transthoracic echocardiogram  02/07/2004    EF 60%, no evidence of significant valvular abnormalities.  . Finger amputation Right     middle  . Angioplasty rll  12/2012, 2015    at [facility]  . Angioplasty r hand  12/2012, 2015    at [facility] History  Problem Relation Age of Onset  . Stroke Mother   . Diabetes Mother   . Cancer Mother   . Hypertension Mother   . Coronary artery disease Father   . Diabetes Sister     Social History:  reports that she has been passively smoking Cigarettes.  She has been smoking about 0.00 packs per day. She has never used smokeless tobacco. She reports that she does not drink alcohol or use illicit drugs.  Allergies:  Allergies  Allergen Reactions  . Hydralazine Rash  . Hydrochlorothiazide Hives and Rash    Medications: Prior to Admission medications   Medication Sig Start Date End Date Taking?  Authorizing Provider  atorvastatin (LIPITOR) 10 MG tablet Take 10 mg by mouth every morning.    Yes Historical Provider, MD  carbidopa-levodopa (SINEMET IR) 25-100 MG per tablet Take 1.5 tablets by mouth 3 (three) times daily.   Yes Historical Provider, MD  carvedilol (COREG) 6.25 MG tablet Take 18.75 mg by mouth 2 (two) times daily with a meal. Does not take morning dose on dialysis days (M-W-F) 02/17/13  Yes Geradine Girt, DO  cinacalcet (SENSIPAR) 30 MG tablet Take 30 mg by mouth every evening.    Yes Historical Provider, MD  clopidogrel (PLAVIX) 75 MG tablet Take 75 mg by mouth daily. 05/24/13  Yes Historical Provider, MD  collagenase (SANTYL) ointment Apply 1 application topically daily. 05/29/13  Yes Historical Provider, MD  donepezil (ARICEPT) 10 MG tablet Take 10 mg  by mouth at bedtime.    Yes Historical Provider, MD  FLUoxetine (PROZAC) 40 MG capsule Take 40 mg by mouth every morning.    Yes Historical Provider, MD  Breck Coons AND STRETCH AERO Apply 1 application topically daily. Uses at Dialysis treatments 05/06/13  Yes Historical Provider, MD  HYDROcodone-acetaminophen (NORCO/VICODIN) 5-325 MG per tablet Take 1 tablet by mouth every 4 (four) hours as needed. For pain 05/29/13  Yes Historical Provider, MD  insulin glargine (LANTUS) 100 UNIT/ML injection Inject 1-5 Units into the skin at bedtime as needed. Per Sliding Scale   Yes Historical Provider, MD  insulin lispro (HUMALOG) 100 UNIT/ML injection Inject 1-4 Units into the skin 3 (three) times daily with meals. 200-250= 1 unit 251-300= 2 unit 301-350= 3 unit 351-400= 4 unit 05/29/13 05/29/14 Yes Historical Provider, MD  levofloxacin (LEVAQUIN) 500 MG tablet Take 500 mg by mouth daily. 14 day course starting on 05/30/2013 05/30/13 06/14/2013 Yes Historical Provider, MD  levothyroxine (SYNTHROID, LEVOTHROID) 25 MCG tablet Take 25 mcg by mouth every morning.   Yes Historical Provider, MD  Maltodextrin-Xanthan Gum (Archer) POWD To  thicken liquids to nectar thick 03/09/13  Yes Geradine Girt, DO  multivitamin (RENA-VIT) TABS tablet Take 1 tablet by mouth 2 (two) times daily.    Yes Historical Provider, MD  Nutritional Supplements (FEEDING SUPPLEMENT, NEPRO CARB STEADY,) LIQD Take 237 mLs by mouth daily. 02/17/13  Yes Geradine Girt, DO  Vitamin D, Ergocalciferol, (DRISDOL) 50000 UNITS CAPS capsule Take 1 capsule by mouth once a week. 03/17/13  Yes Historical Provider, MD    Scheduled Meds: . atorvastatin  10 mg Oral q morning - 10a  . carbidopa-levodopa  1.5 tablet Oral TID  . donepezil  10 mg Oral QHS  . feeding supplement (NEPRO CARB STEADY)  237 mL Oral Q24H  . insulin aspart  0-5 Units Subcutaneous QHS  . insulin aspart  0-9 Units Subcutaneous TID WC  . metoprolol  5 mg Intravenous 4 times per day  . multivitamin  1 tablet Oral BID  . pentafluoroprop-tetrafluoroeth  1 application Topical Daily  . piperacillin-tazobactam (ZOSYN)  IV  2.25 g Intravenous Q8H  . sodium chloride  3 mL Intravenous Q12H  . sodium chloride  3 mL Intravenous Q12H  . vancomycin  500 mg Intravenous Q M,W,F-HD  . Vitamin D (Ergocalciferol)  50,000 Units Oral Weekly   Continuous Infusions:  PRN Meds:.sodium chloride, sodium chloride, sodium chloride, alteplase, dextrose, feeding supplement (NEPRO CARB STEADY), heparin, heparin, lidocaine (PF), lidocaine-prilocaine, pentafluoroprop-tetrafluoroeth, RESOURCE THICKENUP CLEAR, sodium chloride   Blood pressure 126/62, pulse 101, temperature 97.7 F (36.5 C), temperature source Oral, resp. rate 18, height 5' 6.93" (1.7 m), weight 55.4 kg (122 lb 2.2 oz), SpO2 100.00%.   Results for orders placed during the hospital encounter of 05/31/13 (from the past 48 hour(s))  CBG MONITORING, ED     Status: None   Collection Time    05/31/13  5:32 PM      Result Value Ref Range   Glucose-Capillary 82  70 - 99 mg/dL  CBC WITH DIFFERENTIAL     Status: Abnormal   Collection Time    05/31/13  6:27 PM       Result Value Ref Range   WBC 19.9 (*) 4.0 - 10.5 K/uL   RBC 4.11  3.87 - 5.11 MIL/uL   Hemoglobin 12.8  12.0 - 15.0 g/dL   HCT 38.4  36.0 - 46.0 %   MCV 93.4  78.0 -  100.0 fL   MCH 31.1  26.0 - 34.0 pg   MCHC 33.3  30.0 - 36.0 g/dL   RDW 17.8 (*) 11.5 - 15.5 %   Platelets 295  150 - 400 K/uL   Neutrophils Relative % 81 (*) 43 - 77 %   Neutro Abs 16.0 (*) 1.7 - 7.7 K/uL   Lymphocytes Relative 5 (*) 12 - 46 %   Lymphs Abs 1.0  0.7 - 4.0 K/uL   Monocytes Relative 14 (*) 3 - 12 %   Monocytes Absolute 2.8 (*) 0.1 - 1.0 K/uL   Eosinophils Relative 0  0 - 5 %   Eosinophils Absolute 0.0  0.0 - 0.7 K/uL   Basophils Relative 0  0 - 1 %   Basophils Absolute 0.0  0.0 - 0.1 K/uL  BASIC METABOLIC PANEL     Status: Abnormal   Collection Time    05/31/13  6:27 PM      Result Value Ref Range   Sodium 145  137 - 147 mEq/L   Potassium 3.5 (*) 3.7 - 5.3 mEq/L   Chloride 98  96 - 112 mEq/L   CO2 30  19 - 32 mEq/L   Glucose, Bld 109 (*) 70 - 99 mg/dL   BUN 7  6 - 23 mg/dL   Creatinine, Ser 2.33 (*) 0.50 - 1.10 mg/dL   Calcium 10.7 (*) 8.4 - 10.5 mg/dL   GFR calc non Af Amer 19 (*) >90 mL/min   GFR calc Af Amer 22 (*) >90 mL/min   Comment: (NOTE)     The eGFR has been calculated using the CKD EPI equation.     This calculation has not been validated in all clinical situations.     eGFR's persistently <90 mL/min signify possible Chronic Kidney     Disease.  LACTIC ACID, PLASMA     Status: Abnormal   Collection Time    05/31/13  6:27 PM      Result Value Ref Range   Lactic Acid, Venous 4.4 (*) 0.5 - 2.2 mmol/L  CULTURE, BLOOD (ROUTINE X 2)     Status: None   Collection Time    05/31/13  6:27 PM      Result Value Ref Range   Specimen Description BLOOD LEFT ANTECUBITAL     Special Requests       Value: BOTTLES DRAWN AEROBIC AND ANAEROBIC 6CC  IMMUNE:COMPROMISED   Culture NO GROWTH 1 DAY     Report Status PENDING    TROPONIN I     Status: None   Collection Time    05/31/13  6:27 PM       Result Value Ref Range   Troponin I <0.30  <0.30 ng/mL   Comment:            Due to the release kinetics of cTnI,     a negative result within the first hours     of the onset of symptoms does not rule out     myocardial infarction with certainty.     If myocardial infarction is still suspected,     repeat the test at appropriate intervals.  HEPATIC FUNCTION PANEL     Status: Abnormal   Collection Time    05/31/13  6:31 PM      Result Value Ref Range   Total Protein 5.7 (*) 6.0 - 8.3 g/dL   Albumin 1.6 (*) 3.5 - 5.2 g/dL   AST 20  0 - 37 U/L  ALT 5  0 - 35 U/L   Alkaline Phosphatase 190 (*) 39 - 117 U/L   Total Bilirubin 0.6  0.3 - 1.2 mg/dL   Bilirubin, Direct 0.2  0.0 - 0.3 mg/dL   Indirect Bilirubin 0.4  0.3 - 0.9 mg/dL  PROTIME-INR     Status: Abnormal   Collection Time    05/31/13  6:31 PM      Result Value Ref Range   Prothrombin Time 15.6 (*) 11.6 - 15.2 seconds   INR 1.27  0.00 - 1.49  CULTURE, BLOOD (ROUTINE X 2)     Status: None   Collection Time    05/31/13  7:13 PM      Result Value Ref Range   Specimen Description BLOOD LEFT HAND     Special Requests BOTTLES DRAWN AEROBIC ONLY 4CC  IMMUNE:COMPROMISED     Culture NO GROWTH 1 DAY     Report Status PENDING    VITAMIN B12     Status: Abnormal   Collection Time    05/31/13  8:40 PM      Result Value Ref Range   Vitamin B-12 1725 (*) 211 - 911 pg/mL   Comment: Performed at [place]     Status: Abnormal   Collection Time    05/31/13  8:40 PM      Result Value Ref Range   Sed Rate 37 (*) 0 - 22 mm/hr  RPR     Status: None   Collection Time    05/31/13  8:40 PM      Result Value Ref Range   RPR NON REAC  NON REAC   Comment: Performed at [place] (ROUTINE TESTING)     Status: None   Collection Time    05/31/13  8:40 PM      Result Value Ref Range   HIV 1&2 Ab, 4th Generation NONREACTIVE  NONREACTIVE   Comment: (NOTE)     A NONREACTIVE HIV Ag/Ab  result does not exclude HIV infection since     the time frame for seroconversion is variable. If acute HIV infection     is suspected, a HIV-1 RNA Qualitative TMA test is recommended.     HIV-1/2 Antibody Diff         Not indicated.     HIV-1 RNA, Qual TMA           Not indicated.     PLEASE NOTE: This information has been disclosed to you from records     whose confidentiality may be protected by state law. If your state     requires such protection, then the state law prohibits you from making     any further disclosure of the information without the specific written     consent of the person to whom it pertains, or as otherwise permitted     by law. A general authorization for the release of medical or other     information is NOT sufficient for this purpose.     The performance of this assay has not been clinically validated in     patients less than 32 years old.     Performed at [facility]     Status: None   Collection Time    05/31/13  8:40 PM      Result Value Ref Range   Total CK 36  7 - 177 U/L   CK,  MB 2.3  0.3 - 4.0 ng/mL   Relative Index RELATIVE INDEX IS INVALID  0.0 - 2.5   Comment: WHEN CK < 100 U/L                Performed at [facility]  TSH     Status: Abnormal   Collection Time    05/31/13  9:40 PM      Result Value Ref Range   TSH 0.231 (*) 0.350 - 4.500 uIU/mL   Comment: Please note change in reference range.     Performed at [place]     Status: Abnormal   Collection Time    05/31/13  9:40 PM      Result Value Ref Range   Ammonia 10 (*) 11 - 60 umol/L  HEMOGLOBIN A1C     Status: Abnormal   Collection Time    05/31/13 10:41 PM      Result Value Ref Range   Hemoglobin A1C 6.0 (*) <5.7 %   Comment: (NOTE)                                                                               According to the ADA Clinical Practice Recommendations for 2011, when     HbA1c is used as a screening test:       >=6.5%   Diagnostic of Diabetes Mellitus               (if abnormal result is confirmed)     5.7-6.4%   Increased risk of developing Diabetes Mellitus     References:Diagnosis and Classification of Diabetes Mellitus,Diabetes     XBLT,9030,09(QZRAQ 1):S62-S69 and Standards of Medical Care in             Diabetes - 2011,Diabetes Care,2011,34 (Suppl 1):S11-S61.   Mean Plasma Glucose 126 (*) <117 mg/dL   Comment: Performed at [place], [facility]     Status: Abnormal   Collection Time    05/31/13 11:47 PM      Result Value Ref Range   Glucose-Capillary 144 (*) 70 - 99 mg/dL  TROPONIN I     Status: Abnormal   Collection Time    06/01/13  6:03 AM      Result Value Ref Range   Troponin I 0.48 (*) <0.30 ng/mL   Comment: CRITICAL RESULT CALLED TO, READ BACK BY AND VERIFIED WITH:     HOLT,J AT 7:15AM ON 06/01/13 BY FESTERMAN,C                Due to the release kinetics of cTnI,     a negative result within the first hours     of the onset of symptoms does not rule out     myocardial infarction with certainty.     If myocardial infarction is still suspected,     repeat the test at appropriate intervals.  GLUCOSE, CAPILLARY     Status: Abnormal   Collection Time    06/01/13  7:37 AM      Result Value Ref Range   Glucose-Capillary 120 (*) 70 - 99 mg/dL   Comment 1 Notify RN    GLUCOSE,  CAPILLARY     Status: Abnormal   Collection Time    06/01/13 11:31 AM      Result Value Ref Range   Glucose-Capillary 108 (*) 70 - 99 mg/dL   Comment 1 Notify RN    GLUCOSE, CAPILLARY     Status: Abnormal   Collection Time    06/01/13  4:55 PM      Result Value Ref Range   Glucose-Capillary 131 (*) 70 - 99 mg/dL   Comment 1 Notify RN    GLUCOSE, CAPILLARY     Status: None   Collection Time    06/01/13  9:47 PM      Result Value Ref Range   Glucose-Capillary 96  70 - 99 mg/dL  BASIC METABOLIC PANEL     Status: Abnormal   Collection Time    06/02/13  6:13 AM      Result Value  Ref Range   Sodium 147  137 - 147 mEq/L   Potassium 3.9  3.7 - 5.3 mEq/L   Chloride 101  96 - 112 mEq/L   CO2 29  19 - 32 mEq/L   Glucose, Bld 76  70 - 99 mg/dL   BUN 17  6 - 23 mg/dL   Creatinine, Ser 3.49 (*) 0.50 - 1.10 mg/dL   Calcium 10.9 (*) 8.4 - 10.5 mg/dL   GFR calc non Af Amer 12 (*) >90 mL/min   GFR calc Af Amer 13 (*) >90 mL/min   Comment: (NOTE)     The eGFR has been calculated using the CKD EPI equation.     This calculation has not been validated in all clinical situations.     eGFR's persistently <90 mL/min signify possible Chronic Kidney     Disease.  GLUCOSE, CAPILLARY     Status: Abnormal   Collection Time    06/02/13  7:15 AM      Result Value Ref Range   Glucose-Capillary 57 (*) 70 - 99 mg/dL   Comment 1 Notify RN     Comment 2 Documented in Chart    GLUCOSE, CAPILLARY     Status: Abnormal   Collection Time    06/02/13  8:14 AM      Result Value Ref Range   Glucose-Capillary 115 (*) 70 - 99 mg/dL   Comment 1 Notify RN     Comment 2 Documented in Chart      Ct Head Wo Contrast  05/31/2013   CLINICAL DATA:  Altered mental status  EXAM: CT HEAD WITHOUT CONTRAST  TECHNIQUE: Contiguous axial images were obtained from the base of the skull through the vertex without intravenous contrast.  COMPARISON:  Prior CT from 03/02/2013  FINDINGS: Diffuse prominence of the CSF containing spaces is compatible with generalized cerebral atrophy. Scattered and confluent hypodensity within the periventricular and deep white matter of both cerebral hemispheres is most compatible with moderate chronic microvascular ischemic changes. Remote left basal ganglia infarcts noted, unchanged.  There is no acute intracranial hemorrhage or infarct. No mass lesion or midline shift. Gray-white matter differentiation is well maintained. Ventricles are normal in size without evidence of hydrocephalus. CSF containing spaces are within normal limits. No extra-axial fluid collection.  The calvarium is  intact.  Orbital soft tissues are within normal limits.  The paranasal sinuses and mastoid air cells are well pneumatized and free of fluid.  Scalp soft tissues are unremarkable.  IMPRESSION: 1. No acute intracranial process. 2. Stable atrophy with moderate chronic microvascular ischemic disease involving  the supratentorial white matter. 3. Remote left basal ganglia infarct.   Electronically Signed   By: Jeannine Boga M.D.   On: 05/31/2013 19:21   Mr Brain Wo Contrast  06/01/2013   CLINICAL DATA:  80 year old female with altered mental status and weakness. Confusion. Initial encounter.  EXAM: MRI HEAD WITHOUT CONTRAST  TECHNIQUE: Multiplanar, multiecho pulse sequences of the brain and surrounding structures were obtained without intravenous contrast.  COMPARISON:  Head CT without contrast 05/31/2013 and earlier.  FINDINGS: Stable cerebral volume. No midline shift, mass effect, evidence of mass lesion, ventriculomegaly, extra-axial collection or acute intracranial hemorrhage. Cervicomedullary junction and pituitary are within normal limits. Negative visualized cervical spine. Major intracranial vascular flow voids are stable.  Widespread abnormal T2 and FLAIR hyperintensity in the brain, most resembling sequelae of chronic small vessel disease. There are small chronic infarcts in the cerebellum (right medial cerebellar hemisphere most affected). Evidence of a chronic cortically based infarct in the right frontal operculum noted (series 5 image 16). Chronic lacunar infarct extending from the left corona radiata to the lentiform nuclei. Occasional trace chronic blood products.  On diffusion-weighted imaging today there is a small focus of increased trace diffusion signal in the right gyrus rectus (series 100, image 14 and series 101, image 29), not correlated on ADC. No associated T2 or FLAIR hyperintensity. Burtis Junes this is artifact.  Trace mastoid fluid. Minor paranasal sinus mucosal thickening. Visualized  orbit soft tissues are within normal limits. Visualized scalp soft tissues are within normal limits. Normal bone marrow signal.  IMPRESSION: 1. No definite acute intracranial abnormality. A 4 mm focus of increased trace diffusion signal in the right gyrus rectus is felt to be artifact. 2. Chronic  small and medium-sized vessel ischemia.   Electronically Signed   By: Lars Pinks M.D.   On: 06/01/2013 09:35   Dg Chest Portable 1 View  05/31/2013   CLINICAL DATA:  Altered mental status; history of previous CVA as well as Parkinson's disease, cardiac dysrhythmia, diabetes and dialysis dependent renal failure  EXAM: PORTABLE CHEST - 1 VIEW  COMPARISON:  DG CHEST 1V PORT dated 03/07/2013  FINDINGS: The lungs are adequately inflated. There is no focal infiltrate. The cardiopericardial silhouette is mildly enlarged but stable. The pulmonary vascularity is not engorged. There is no significant pleural effusion demonstrated. There is tortuosity of the descending thoracic aorta. A vascular graft is present presumably in the right axilla.  IMPRESSION: There is no evidence of pneumonia nor CHF currently. There is mild stable enlargement of the cardiac silhouette.   Electronically Signed   By: David  Martinique   On: 05/31/2013 18:18        Camber Ninh A. [name], M.D.  Diplomate, Tax adviser of Psychiatry and Neurology ( Neurology). 06/02/2013, 8:51 AM

## 2013-06-02 NOTE — Progress Notes (Signed)
Patients BP elevated just prior to going to dialysis at 145/105.  Also red rash over patients chest, torso, and R leg pointed out by [place] nurse.  Small red raised areas noted - patient has no complaints of itching at this time.  Appears to be mild.  Lurlean Leyden, NP paged and made aware of these findings.

## 2013-06-02 NOTE — Progress Notes (Signed)
Hypoglycemic Event  CBG: 57  Treatment: D50 IV 50 mL  Symptoms: None  Follow-up CBG: Time:0810 CBG Result:115  Possible Reasons for Event: Inadequate meal intake - NPO  Comments/MD notified:Dr. Caryn Section paged and made aware.  Patient alert at this time, confused at baseline. No s/s at this time.    Katrina Meyer  Remember to initiate Hypoglycemia Order Set & complete

## 2013-06-02 NOTE — Progress Notes (Signed)
Patient in dialysis EEG to be done Thursday April 30

## 2013-06-02 NOTE — Procedures (Signed)
   HEMODIALYSIS TREATMENT NOTE:  3.5 hour low-heparin dialysis completed via right upper arm AVG (15g ante/retrograde).  Goal NOT met:  BP unable to tolerate any ultrafiltration.  SBP 80s within 30 minutes of starting session.  Net UF +45cc.  Dr. Theador Hawthorne was notified.  All blood was reinfused and hemostasis was achieved within 15 minutes.  Report given to Russ Halo, RN.  Rockwell Alexandria, RN, CDN

## 2013-06-02 NOTE — Consult Note (Signed)
WOC wound consult note Reason for Consult: multiple wounds.  Pt from home, has recently undergone revascularization per granddaughter not within the [facility] system last week and recent amputation of the right middle finger related to vascular compromise in the RUE as well.  She is on an air mattress at home and cared for 24 hours a day by her family.  She was found unresponsive early this week at HD and transported to hospital.  Pt is followed by vascular surgeon for RLE wounds, which some have healed or remained stable per granddaughter at the bedside. Pt is not able to speak to me during my assessment but will follow some commands.  R heel seems to be most painful to her. Papable pulse in the RLE, she is a AKA on the left.  Wound type: 1. Unstageable Pressure Ulcer: Sacrum 2. Surgical wound right middle finger with some skin necrosis medially and laterally to this incision with sutures  3. MASD (Moisture Associated Skin Damage) bilateral buttocks related in incontinence of bowel, which is new for this patient per granddaughter.  4. Arterial/ischemic ulceration: right lateral foot 5. Arterial/ischemic ulcerations: right great toe, 2nd, and 3rd toes 6. Unstageable Pressure Ulcer: right heel  Pressure Ulcer POA: Yes x 2  Measurement:. Sacrum 3.0cm x 3.5xm 0.2cm Right hand: linear incision on the dorsal hand 6cm with sutures; lateral hand eschar: 6cm x 2.0cm x 0 R lateral foot: 9cm x2cm x 0 Toe tips 1st-3rd 0.5cm x 0.5cm x 0.2cm  Right heel: 6cm x 8cm  x 0.2cm  Wound bed: Sacrum: 95% yellow/brown slough with some pink at slough edge Right hand eschar laterally and medially on hand Right heel: had bulla roof but was hanging loose with pus collection and base was dark maroon and fluctuant, therefore I chose to perform CSWD at the bedside to remove non-viable tissue.  Right lateral foot and toe tips 100% dry stable eschar  Drainage (amount, consistency, odor)  Right hand, right lateral foot, right  toe tips have no drainage.  The sacrum with minimal serosanguinous drainage, no odor.  Right heel with minimal drainage, however pus trapped under the blister roof which was loose at one side The right heel is very fluctuant  and concerning for deeper tissue damage. May benefit from xray or MRI to verify no osteomyelitis   Periwound: intact at all sites, some erythema noted at the right heel   Conservative sharp wound debridement (CSWD performed at the bedside): explained rationale at bedside to granddaughter Prepped area with betadine swab, removed loose avascular tissue with sterile scissors, base has some pink tissue but 90% purple, maroon color of base tissue and continues to be a bit fluctuant.   Dressing procedure/placement/frequency:Add air mattress for pressure redistribution, as this patient is not turning her self and has AKA on the left.  Prevalon boot for the right heel for offloading of pressure and to assist with less lateral rotation. Betadine for the dry eschar (both right hand and RLE)  to keep stable and for the right heel to attempt to dry up a bit and keep free from infection. Add enzymatic debridement ointment to clear sacrum of the necrotic tissue. Zinc based barrier ointment to the buttocks if she continues to be incontinent of bowel to protect and prevent further MASD.   Discussed POC with patient and bedside nurse.  Re consult if needed, will not follow at this time. Thanks  Armilda Vanderlinden [place], [place] [phone])

## 2013-06-02 NOTE — Progress Notes (Signed)
Patient alert after return from dialysis.  Patient not speaking at this time, but looks to you when speaking, eyes open.  Attempted to give patient PO medication, but not following prompts to swallow pills.  Gave sips of thick liquid without straw, but patient using a biting motion with mouth.  Would not swallow pill.  When given ice chips, patient able to chew and swallow with out any difficulty or s/s aspiration.  Will continue to give ice chips PO intermittently.

## 2013-06-02 NOTE — Progress Notes (Signed)
[facility] PROGRESS NOTE  KATEENA DEGROOTE ZOX:[id number] DOB: [birth year] DOA: 05/31/76   PCP: Glenda Chroman., MD  Assessment/Plan: Acute encephalopathy: etiology unclear. More alert this am and nursing reports patient whispering but is confused.  May be multifactorial ie. infectious process from skin ulcers vs neuro event.  MRI results negative for stroke. Remains afebrile. Blood cultures x2 with no growth to date. Esr 75. TSH 0.231,RPR within the limits of normal and B12 1725.  Vanc and zoxyn day #3. Leukocytosis trending downward.  Holding home synthroid for now. Await EEG and neurology consultation. Remains NPO due to pocketing of food. ST unable to evaluate yesterday. Will attempt today.   Hypotension: resolved with ns iv. Troponin 0.48, may be related to dialysis. No events on tele. Echo with EF 70% and moderate LVH. Due for dialysis today. Continue scheduled IV metoprolol. Home meds include coreg. Plan to resume this once swallow evaluated.  Will monitor.    Afib: Hx of same. Rate controlled on IV metoprolol.  Home meds include coreg, plavix in spite of hx GI bleed.     Skin ulcers: wound consult. Right foot and buttocks.    ESRD on HD M, W, F : appreciate nephrology assistance. Due for dialysis today  Dm2: A1c 6.0. Hypoglycemia episode this am. Likely related to NPO status. Plan for repeat swallow evaluation and increased po intake today. If unable to increase po intake will consider gentle IV fluids with D5W.   She remains on sensitive SSI.  PAD. Followed by vascular for chronic ischemic ulcers on LE . S/p left AKA.    Code Status: full Family Communication: granddaughter at bedside Disposition Plan: home when ready   Consultants:  Nephrology   neurology  Procedures:  2 d-echo: Vigorous systolic function with obliteration of the LV cavity, EF > 70%. The cavity size was mildly reduced. Wall thickness was increased in a pattern of moderate LVH. Wall motion was normal;  there were no regional wall motion abnormalities. The study was not technically sufficient to allow evaluation of LV diastolic dysfunction due to atrial fibrillation.  Antibiotics: Vancomycin 05/31/13>.  Zosyn 05/31/13>>   HPI/Subjective: Awake, attempting to speak. Follow simple commands.   Objective: Filed Vitals:   06/02/13 0535  BP: 126/62  Pulse: 101  Temp:   Resp:     Intake/Output Summary (Last 24 hours) at 06/02/13 0931 Last data filed at 06/02/13 0539  Gross per 24 hour  Intake    100 ml  Output      0 ml  Net    100 ml   Filed Weights   06/01/13 0413 06/02/13 0404 06/02/13 0535  Weight: 53.6 kg (118 lb 2.7 oz) 53 kg (116 lb 13.5 oz) 55.4 kg (122 lb 2.2 oz)    Exam:   General:  Well nourished but chronically ill appearing  Cardiovascular: irregularly irregular +murmur no gallup no LE edema on right. Left BKA  Respiratory: normal effort somewhat shallow. BS clear bilaterally no wheeze  Abdomen: soft +BS non-tender to palpation  Musculoskeletal: no clubbing or cyanosis. Left AKA  skin dressing to right foot dry and intact. Dressing to right hand dry and intact.     Data Reviewed: Basic Metabolic Panel:  Recent Labs Lab 05/31/13 1827 06/02/13 0613  NA 145 147  K 3.5* 3.9  CL 98 101  CO2 30 29  GLUCOSE 109* 76  BUN 7 17  CREATININE 2.33* 3.49*  CALCIUM 10.7* 10.9*   Liver Function Tests:  Recent Labs Lab 05/31/13 1831  AST 20  ALT 5  ALKPHOS 190*  BILITOT 0.6  PROT 5.7*  ALBUMIN 1.6*   No results found for this basename: LIPASE, AMYLASE,  in the last 168 hours  Recent Labs Lab 05/31/13 2140  AMMONIA 10*   CBC:  Recent Labs Lab 05/31/13 1827  WBC 19.9*  NEUTROABS 16.0*  HGB 12.8  HCT 38.4  MCV 93.4  PLT 295   Cardiac Enzymes:  Recent Labs Lab 05/31/13 1827 05/31/13 2040 06/01/13 0603  CKTOTAL  --  36  --   CKMB  --  2.3  --   TROPONINI <0.30  --  0.48*   BNP (last 3 results)  Recent Labs  03/02/13 1645   PROBNP 13840.0*   CBG:  Recent Labs Lab 06/01/13 1131 06/01/13 1655 06/01/13 2147 06/02/13 0715 06/02/13 0814  GLUCAP 108* 131* 96 57* 115*    Recent Results (from the past 240 hour(s))  CULTURE, BLOOD (ROUTINE X 2)     Status: None   Collection Time    05/31/13  6:27 PM      Result Value Ref Range Status   Specimen Description BLOOD LEFT ANTECUBITAL   Final   Special Requests     Final   Value: BOTTLES DRAWN AEROBIC AND ANAEROBIC 6CC  IMMUNE:COMPROMISED   Culture NO GROWTH 1 DAY   Final   Report Status PENDING   Incomplete  CULTURE, BLOOD (ROUTINE X 2)     Status: None   Collection Time    05/31/13  7:13 PM      Result Value Ref Range Status   Specimen Description BLOOD LEFT HAND   Final   Special Requests BOTTLES DRAWN AEROBIC ONLY 4CC  IMMUNE:COMPROMISED   Final   Culture NO GROWTH 1 DAY   Final   Report Status PENDING   Incomplete     Studies: Ct Head Wo Contrast  05/31/2013   CLINICAL DATA:  Altered mental status  EXAM: CT HEAD WITHOUT CONTRAST  TECHNIQUE: Contiguous axial images were obtained from the base of the skull through the vertex without intravenous contrast.  COMPARISON:  Prior CT from 03/02/2013  FINDINGS: Diffuse prominence of the CSF containing spaces is compatible with generalized cerebral atrophy. Scattered and confluent hypodensity within the periventricular and deep white matter of both cerebral hemispheres is most compatible with moderate chronic microvascular ischemic changes. Remote left basal ganglia infarcts noted, unchanged.  There is no acute intracranial hemorrhage or infarct. No mass lesion or midline shift. Gray-white matter differentiation is well maintained. Ventricles are normal in size without evidence of hydrocephalus. CSF containing spaces are within normal limits. No extra-axial fluid collection.  The calvarium is intact.  Orbital soft tissues are within normal limits.  The paranasal sinuses and mastoid air cells are well pneumatized and  free of fluid.  Scalp soft tissues are unremarkable.  IMPRESSION: 1. No acute intracranial process. 2. Stable atrophy with moderate chronic microvascular ischemic disease involving the supratentorial white matter. 3. Remote left basal ganglia infarct.   Electronically Signed   By: Jeannine Boga M.D.   On: 05/31/2013 19:21   Mr Brain Wo Contrast  06/01/2013   CLINICAL DATA:  80 year old female with altered mental status and weakness. Confusion. Initial encounter.  EXAM: MRI HEAD WITHOUT CONTRAST  TECHNIQUE: Multiplanar, multiecho pulse sequences of the brain and surrounding structures were obtained without intravenous contrast.  COMPARISON:  Head CT without contrast 05/31/2013 and earlier.  FINDINGS: Stable cerebral volume. No midline shift, mass effect, evidence of mass  lesion, ventriculomegaly, extra-axial collection or acute intracranial hemorrhage. Cervicomedullary junction and pituitary are within normal limits. Negative visualized cervical spine. Major intracranial vascular flow voids are stable.  Widespread abnormal T2 and FLAIR hyperintensity in the brain, most resembling sequelae of chronic small vessel disease. There are small chronic infarcts in the cerebellum (right medial cerebellar hemisphere most affected). Evidence of a chronic cortically based infarct in the right frontal operculum noted (series 5 image 16). Chronic lacunar infarct extending from the left corona radiata to the lentiform nuclei. Occasional trace chronic blood products.  On diffusion-weighted imaging today there is a small focus of increased trace diffusion signal in the right gyrus rectus (series 100, image 14 and series 101, image 29), not correlated on ADC. No associated T2 or FLAIR hyperintensity. Burtis Junes this is artifact.  Trace mastoid fluid. Minor paranasal sinus mucosal thickening. Visualized orbit soft tissues are within normal limits. Visualized scalp soft tissues are within normal limits. Normal bone marrow signal.   IMPRESSION: 1. No definite acute intracranial abnormality. A 4 mm focus of increased trace diffusion signal in the right gyrus rectus is felt to be artifact. 2. Chronic  small and medium-sized vessel ischemia.   Electronically Signed   By: Lars Pinks M.D.   On: 06/01/2013 09:35   Dg Chest Portable 1 View  05/31/2013   CLINICAL DATA:  Altered mental status; history of previous CVA as well as Parkinson's disease, cardiac dysrhythmia, diabetes and dialysis dependent renal failure  EXAM: PORTABLE CHEST - 1 VIEW  COMPARISON:  DG CHEST 1V PORT dated 03/07/2013  FINDINGS: The lungs are adequately inflated. There is no focal infiltrate. The cardiopericardial silhouette is mildly enlarged but stable. The pulmonary vascularity is not engorged. There is no significant pleural effusion demonstrated. There is tortuosity of the descending thoracic aorta. A vascular graft is present presumably in the right axilla.  IMPRESSION: There is no evidence of pneumonia nor CHF currently. There is mild stable enlargement of the cardiac silhouette.   Electronically Signed   By: David  Martinique   On: 05/31/2013 18:18    Scheduled Meds: . atorvastatin  10 mg Oral q morning - 10a  . carbidopa-levodopa  1.5 tablet Oral TID  . donepezil  10 mg Oral QHS  . feeding supplement (NEPRO CARB STEADY)  237 mL Oral Q24H  . insulin aspart  0-5 Units Subcutaneous QHS  . insulin aspart  0-9 Units Subcutaneous TID WC  . metoprolol  5 mg Intravenous 4 times per day  . multivitamin  1 tablet Oral BID  . pentafluoroprop-tetrafluoroeth  1 application Topical Daily  . piperacillin-tazobactam (ZOSYN)  IV  2.25 g Intravenous Q8H  . sodium chloride  3 mL Intravenous Q12H  . sodium chloride  3 mL Intravenous Q12H  . vancomycin  500 mg Intravenous Q M,W,F-HD  . Vitamin D (Ergocalciferol)  50,000 Units Oral Weekly   Continuous Infusions:   Principal Problem:   Acute encephalopathy Active Problems:   Atrial fibrillation   Parkinson's disease   CKD  (chronic kidney disease) stage V requiring chronic dialysis   Hypertension   Dementia   Altered mental status   Protein-calorie malnutrition, severe   Hypoglycemia    Time spent: 35 minutes    [facility] Pager [phone]. If 7PM-7AM, please contact night-coverage at [email or web address], password [facility] 06/02/2013, 9:31 AM  LOS: 2 days

## 2013-06-02 NOTE — Progress Notes (Signed)
Inpatient Diabetes Program Recommendations  AACE/ADA: New Consensus Statement on Inpatient Glycemic Control (2013)  Target Ranges:  Prepandial:   less than 140 mg/dL      Peak postprandial:   less than 180 mg/dL (1-2 hours)      Critically ill patients:  140 - 180 mg/dL   Results for MAECIE, SEVCIK (MRN [id number]) as of 06/02/2013 08:26  Ref. Range 05/31/2013 22:41  Hemoglobin A1C Latest Range: <5.7 % 6.0 (H)   Results for ALISHIA, LEBO (MRN [id number]) as of 06/02/2013 08:26  Ref. Range 05/31/2013 17:32 05/31/2013 23:47 06/01/2013 07:37 06/01/2013 11:31 06/01/2013 16:55 06/01/2013 21:47 06/02/2013 07:15 06/02/2013 08:14  Glucose-Capillary Latest Range: 70-99 mg/dL 82 144 (H) 120 (H) 108 (H) 131 (H) 96 57 (L) 115 (H)   Diabetes history: DM2 Outpatient Diabetes medications: Lantus 1-5 units QHS (sliding scale), Humalog 1-4 units TID with meals Current orders for Inpatient glycemic control: Novolog 0-9 units AC, Novolog 0-5 units HS  Inpatient Diabetes Program Recommendations IV fluids: Noted patient was hypoglycemic this morning and is NPO at this time. May want to consider ordering IVF with dextrose if patient will remain NPO.  Note: Patient noted to have CBG of 57 mg/dl this am at 7:15. Patient has not received any insulin since being admitted to the hospital. Patient is NPO at this time. If patient will remain NPO, may want to order IV fluids with dextrose to prevent reoccurrence of hypoglycemia.    Thanks, Barnie Alderman, RN, MSN, CCRN Diabetes Coordinator Inpatient Diabetes Program [phone] (Team Pager) [phone] ([place] office) [phone] [facility] office)

## 2013-06-02 NOTE — Progress Notes (Addendum)
SARANYA HARLIN  MRN: [id number]  DOB/AGE: March 23, 1932 80 y.o.  Primary Care Physician:VYAS,DHRUV B., MD  Admit date: 05/31/2013  Chief Complaint:  Chief Complaint  Patient presents with  . Altered Mental Status    S-Pt presented on  05/31/2013 with  Chief Complaint  Patient presents with  . Altered Mental Status  .    Pt unable to offer any complaints.  Meds . atorvastatin  10 mg Oral q morning - 10a  . carbidopa-levodopa  1.5 tablet Oral TID  . donepezil  10 mg Oral QHS  . feeding supplement (NEPRO CARB STEADY)  237 mL Oral Q24H  . insulin aspart  0-5 Units Subcutaneous QHS  . insulin aspart  0-9 Units Subcutaneous TID WC  . metoprolol  5 mg Intravenous 4 times per day  . multivitamin  1 tablet Oral BID  . pentafluoroprop-tetrafluoroeth  1 application Topical Daily  . piperacillin-tazobactam (ZOSYN)  IV  2.25 g Intravenous Q8H  . sodium chloride  3 mL Intravenous Q12H  . sodium chloride  3 mL Intravenous Q12H  . vancomycin  500 mg Intravenous Q M,W,F-HD  . Vitamin D (Ergocalciferol)  50,000 Units Oral Weekly    Physical Exam: Vital signs in last 24 hours: Temp:  [97.7 F (36.5 C)-98.4 F (36.9 C)] 97.7 F (36.5 C) (04/29 0359) Pulse Rate:  [68-118] 101 (04/29 0535) Resp:  [18] 18 (04/29 0359) BP: (126-144)/(62-92) 126/62 mmHg (04/29 0535) SpO2:  [96 %-100 %] 100 % (04/29 0359) Weight:  [116 lb 13.5 oz (53 kg)-122 lb 2.2 oz (55.4 kg)] 122 lb 2.2 oz (55.4 kg) (04/29 0535) Weight change: -3 lb 8.4 oz (-1.6 kg) Last BM Date: 06/01/13  Intake/Output from previous day: 04/28 0701 - 04/29 0700 In: 220 [P.O.:220] Out: -      Physical Exam: General- pt is awake, but does not follow comands Resp- No acute REsp distress, Rhonchi minimal CVS- S1S2 regular in rate and rhythm  GIT- BS+, soft, NT, ND  EXT- NO LE Edema, left AKA , Right foot wound+ Access- AVF+   Lab Results: CBC  Recent Labs  05/31/13 1827  WBC 19.9*  HGB 12.8  HCT 38.4  PLT 295     BMET  Recent Labs  05/31/13 1827 06/02/13 0613  NA 145 147  K 3.5* 3.9  CL 98 101  CO2 30 29  GLUCOSE 109* 76  BUN 7 17  CREATININE 2.33* 3.49*  CALCIUM 10.7* 10.9*    MICRO Recent Results (from the past 240 hour(s))  CULTURE, BLOOD (ROUTINE X 2)     Status: None   Collection Time    05/31/13  6:27 PM      Result Value Ref Range Status   Specimen Description BLOOD LEFT ANTECUBITAL   Final   Special Requests     Final   Value: BOTTLES DRAWN AEROBIC AND ANAEROBIC 6CC  IMMUNE:COMPROMISED   Culture NO GROWTH 1 DAY   Final   Report Status PENDING   Incomplete  CULTURE, BLOOD (ROUTINE X 2)     Status: None   Collection Time    05/31/13  7:13 PM      Result Value Ref Range Status   Specimen Description BLOOD LEFT HAND   Final   Special Requests BOTTLES DRAWN AEROBIC ONLY 4CC  IMMUNE:COMPROMISED   Final   Culture NO GROWTH 1 DAY   Final   Report Status PENDING   Incomplete      Lab Results  Component Value Date  CALCIUM 10.9* 06/02/2013   CAION 1.15 03/02/2013   PHOS 3.7 03/05/2013       Impression: 1)Renal ESRD on HD  On MWF schedule  Will dialyze today  2)HTN stable  Medication-  On Beta blockers   3)Anemia HGb at goal (9--11)  4)CKD Mineral-Bone Disorder  Phosphorus at goal.  Hypercalcemia Immobility vs sec to Vitamin D   5)CNs-admitted with AMS  PMD following   6)FEN  Normokalemic  Hyponatremic  Secondary to ESRD   7)Acid base  Co2 at goal   8) ID- sepsis Source Multiple wounds? High WBC. + tachy cardiac + AMS Cultures pending  On vanco + Zosyn      Plan:  Will d/c vitamin d Will suggest xray to right foot Will dialyze today  Addendum Pt seen on HD Pt hypotensive Will start Midodrine before HD   Takina Busser S Myrikal Messmer 06/02/2013, 9:27 AM

## 2013-06-02 NOTE — Progress Notes (Signed)
SLP Cancellation Note  Patient Details Name: Katrina Meyer MRN: [id number] DOB: [birth year]   Cancelled treatment:  Attempt x1 to f/u s/p BSE to reassess swallow to determine PO readiness.   Treatment not completed as patient out of room for HD.  ST to f/u on 06/03/13.  RN made aware.   Sharman Crate [place], [place] Marcille Buffy 06/02/2013, 11:37 AM

## 2013-06-03 ENCOUNTER — Inpatient Hospital Stay (HOSPITAL_COMMUNITY): Payer: Medicare Other

## 2013-06-03 ENCOUNTER — Inpatient Hospital Stay (HOSPITAL_COMMUNITY)
Admit: 2013-06-03 | Discharge: 2013-06-03 | Disposition: A | Discharge status: Home / Self Care | Payer: Medicare Other | Attending: Internal Medicine | Admitting: Internal Medicine

## 2013-06-03 ENCOUNTER — Encounter (HOSPITAL_COMMUNITY): Payer: Self-pay | Admitting: Internal Medicine

## 2013-06-03 DIAGNOSIS — M869 Osteomyelitis, unspecified: Secondary | ICD-10-CM | POA: Diagnosis present

## 2013-06-03 DIAGNOSIS — Z992 Dependence on renal dialysis: Secondary | ICD-10-CM

## 2013-06-03 DIAGNOSIS — R197 Diarrhea, unspecified: Secondary | ICD-10-CM | POA: Diagnosis not present

## 2013-06-03 DIAGNOSIS — N186 End stage renal disease: Secondary | ICD-10-CM | POA: Diagnosis present

## 2013-06-03 HISTORY — DX: Osteomyelitis, unspecified: M86.9

## 2013-06-03 LAB — HEPATITIS B SURFACE ANTIGEN: Hepatitis B Surface Ag: NEGATIVE

## 2013-06-03 LAB — GLUCOSE, CAPILLARY
GLUCOSE-CAPILLARY: 123 mg/dL — AB (ref 70–99)
GLUCOSE-CAPILLARY: 120 mg/dL — AB (ref 70–99)

## 2013-06-03 LAB — RENAL FUNCTION PANEL
Albumin: 1.2 g/dL — ABNORMAL LOW (ref 3.5–5.2)
BUN: 8 mg/dL (ref 6–23)
CHLORIDE: 104 meq/L (ref 96–112)
CO2: 31 mEq/L (ref 19–32)
CREATININE: 2.04 mg/dL — AB (ref 0.50–1.10)
Calcium: 9.3 mg/dL (ref 8.4–10.5)
GFR calc Af Amer: 26 mL/min — ABNORMAL LOW (ref >90)
GFR, EST NON AFRICAN AMERICAN: 22 mL/min — AB (ref >90)
Glucose, Bld: 121 mg/dL — ABNORMAL HIGH (ref 70–99)
PHOSPHORUS: 3.1 mg/dL (ref 2.3–4.6)
Potassium: 2.9 mEq/L — CL (ref 3.7–5.3)
Sodium: 148 mEq/L — ABNORMAL HIGH (ref 137–147)

## 2013-06-03 LAB — CBC
HCT: 33.8 % — ABNORMAL LOW (ref 36.0–46.0)
Hemoglobin: 11 g/dL — ABNORMAL LOW (ref 12.0–15.0)
MCH: 30.4 pg (ref 26.0–34.0)
MCHC: 32.5 g/dL (ref 30.0–36.0)
MCV: 93.4 fL (ref 78.0–100.0)
Platelets: 270 10*3/uL (ref 150–400)
RBC: 3.62 MIL/uL — ABNORMAL LOW (ref 3.87–5.11)
RDW: 17 % — AB (ref 11.5–15.5)
WBC: 15.3 10*3/uL — AB (ref 4.0–10.5)

## 2013-06-03 LAB — TROPONIN I: TROPONIN I: 0.52 ng/mL — AB (ref <0.30)

## 2013-06-03 LAB — MAGNESIUM: Magnesium: 1.8 mg/dL (ref 1.5–2.5)

## 2013-06-03 LAB — OCCULT BLOOD X 1 CARD TO LAB, STOOL: Fecal Occult Bld: POSITIVE — AB

## 2013-06-03 MED ORDER — PIPERACILLIN-TAZOBACTAM 3.375 G IVPB
3.3750 g | Freq: Three times a day (TID) | INTRAVENOUS | Status: DC
  Filled 2013-06-03: qty 50

## 2013-06-03 MED ORDER — ASPIRIN 81 MG PO CHEW: 81.0000 mg | CHEWABLE_TABLET | Freq: Every day | ORAL | Status: DC

## 2013-06-03 MED ORDER — CLOPIDOGREL BISULFATE 75 MG PO TABS
75.0000 mg | ORAL_TABLET | Freq: Every day | ORAL | Status: DC
  Administered 2013-06-04 – 2013-06-17 (×13): 75 mg via ORAL
  Filled 2013-06-03 (×17): qty 1

## 2013-06-03 MED ORDER — POTASSIUM CHLORIDE 10 MEQ/100ML IV SOLN
10.0000 meq | INTRAVENOUS | Status: AC
  Administered 2013-06-03 (×2): 10 meq via INTRAVENOUS
  Filled 2013-06-03: qty 100

## 2013-06-03 MED ORDER — RISAQUAD PO CAPS
1.0000 | ORAL_CAPSULE | Freq: Every day | ORAL | Status: DC
  Administered 2013-06-03 – 2013-06-11 (×9): 1 via ORAL
  Filled 2013-06-03 (×9): qty 1

## 2013-06-03 MED ORDER — METOPROLOL TARTRATE 1 MG/ML IV SOLN
5.0000 mg | Freq: Four times a day (QID) | INTRAVENOUS | Status: DC
  Administered 2013-06-03 – 2013-06-05 (×5): 5 mg via INTRAVENOUS
  Filled 2013-06-03 (×5): qty 5

## 2013-06-03 MED ORDER — LOPERAMIDE HCL 1 MG/5ML PO LIQD
2.0000 mg | Freq: Two times a day (BID) | ORAL | Status: DC
  Administered 2013-06-03 – 2013-06-10 (×11): 2 mg via ORAL
  Filled 2013-06-03 (×17): qty 10

## 2013-06-03 MED ORDER — PIPERACILLIN-TAZOBACTAM IN DEX 2-0.25 GM/50ML IV SOLN
2.2500 g | Freq: Three times a day (TID) | INTRAVENOUS | Status: DC
  Administered 2013-06-03 – 2013-06-04 (×3): 2.25 g via INTRAVENOUS
  Filled 2013-06-03 (×4): qty 50

## 2013-06-03 NOTE — Progress Notes (Signed)
CRITICAL VALUE ALERT  Critical value received:  Blood cultures positive for gram positive cocci in pairs and clusters   Date of notification:  06/03/13  Time of notification:  1050   Critical value read back:yes  Nurse who received alert:  Jerrye Noble RN  MD notified (1st page):  Dyanne Carrel, NP  Time of first page:  1110  MD notified (2nd page):  Time of second page:  Responding MD:  Dyanne Carrel, NP  Time MD responded:  1150

## 2013-06-03 NOTE — Progress Notes (Signed)
The patient was seen and examined. She was discussed with nurse practitioner, Ms. Renard Hamper. Agree with assessment and findings with additional findings and plan below. I discussed the patient and her past history with her daughter, Katrina Meyer.    Acute encephalopathy. Dr. [name] assessment noted. I agree with Dr. [name] that the patient's cognitive deficit or dementia and likely progressive Parkinson's disease are likely contributing to her mutism and intermittent unresponsiveness. Given her history of previous strokes, multi-infarct or vascular dementia is likely. Per Katrina Meyer, the patient has long-standing dementia and Parkinson's disease. At baseline, the patient talks intermittently and often she does not talk at all. [name]. EEG pending.  History of strokes/vascular dementia. No new strokes per MRI. Stool guaiac positive, but she is on PPI. Will restart Plavix and monitor closely. Ammonia level within normal limits. Vitamin B12 level elevated, not deficient. RPR nonreactive. HIV nonreactive.  Right heel osteomyelitis. This was confirmed per MRI. We'll continue vancomycin and Zosyn and plan to narrow antibiotic therapy soon. She could potentially receive vancomycin outpatient for 4 weeks at the dialysis center. Would discuss further with infectious diseases physician.  Multiple pressure/ischemic ulcerations- wounds. Wound care consultation and recommendations noted. Status post sharp debridement, Santyl ointment, dressing changes, air mattress, prolonged boot, etc.  The patient continues to have loose stools/diarrhea which are greenish black in color. Per her daughter, Katrina Meyer, the patient has a history of antibiotic-induced diarrhea. C. difficile PCR is negative. The remainder of the GI panel is pending. We'll order a rectal tube and give Imodium twice a day.  Elevated troponin I. This may be related to tachycardia with rapid atrial fibrillation and/or underlying  decreased clearance in the setting of end-stage renal disease. 2-D echocardiogram revealed no left ventricular wall motion abnormalities and the patient had preserved LV function. Given her chronic debilitation and dementia, I would not favor aggressive diagnostic workup any further. Will restart Plavix and continue statin and IV beta blocker. When she is taking oral medications more consistently, we'll restart carvedilol orally.  Atrial fibrillation with rapid ventricular response. Her heart rate has improved. On IV metoprolol and status post digoxin x1. Also status post gentle IV fluids. Will restart Plavix and will restart carvedilol when she is taking oral medications more consistently.  Query dysphagia versus poor oral intake due to to dementia status Parkinson's disease. Speech therapy consultation/evaluation pending. However, a full liquid tray will be ordered. The nursing staff was given specific instructions to not feed the patient unless she was totally alert and able to follow directions.  Hypotension following hemodialysis. This is likely chronic. Midodrine has been added on hemodialysis days per nephrology.  Hypothyroidism. [name] TSH is slightly low. Synthroid is being held. We'll order a free T4 and consider resuming Synthroid orally or IV depending on free T4.

## 2013-06-03 NOTE — Progress Notes (Signed)
EEG Completed; Results Pending  

## 2013-06-03 NOTE — Progress Notes (Signed)
Patient ID: Katrina Meyer, female   DOB: 12-25-1932, 81 y.o.   MRN: [id number]  [place] A. [name], MD     [email or web address]          Katrina Meyer is an 80 y.o. female.   Assessment/Plan: 1. Unexplained spell of unresponsiveness with spontaneous improvement. She appears to have residual mutism of unclear etiology. This could be due to the effect of old stroke with recurrent symptoms and/or seizures. The spell was associated with hypotension which may be The culprit. 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 An EEG will be obtained. Dementia labs will be obtained.  2. Hypotension of unclear etiology.  3. Old the infarcts seen on imaging both cortical and subcortical associated with petechial hemorrhage.  4. Cognitive impairment at baseline.   The patient still is not talking. No new complaints are reported.  GENERAL: Thin female in no acute distress.  HEENT: Supple. Atraumatic normocephalic.  ABDOMEN: soft  EXTREMITIES: No edema. Left above-the-knee amputation. Right hand in bandage with application of middle finger.  BACK: Normal.  SKIN: Normal by inspection.  MENTAL STATUS: She is awake and alert. She attempts to speak but is essentially mute. She does mouth a few words on naming of objects. She follows commands well.  CRANIAL NERVES: Pupils are equal, round and reactive to light and accommodation; extra ocular movements are full, there is no significant nystagmus; visual fields are full; upper and lower facial muscles are normal in strength and symmetric, there is no flattening of the nasolabial folds; tongue is midline; uvula is midline; shoulder elevation is normal.  MOTOR: Right lower extremity is weak duue to pain 3/5. Other extremities are 5/5.  COORDINATION: Left finger to nose is normal, right finger to nose is normal, No rest tremor; no intention tremor; no postural tremor; no bradykinesia.  REFLEXES: Deep  tendon reflexes are symmetrical and normal.  SENSATION: Unreliable.        Objective: Vital signs in last 24 hours: Temp:  [97.1 F (36.2 C)-98.2 F (36.8 C)] 97.4 F (36.3 C) (04/30 0619) Pulse Rate:  [80-137] 90 (04/30 0619) Resp:  [16-20] 20 (04/30 0619) BP: (80-148)/(40-105) 123/51 mmHg (04/30 0619) SpO2:  [85 %-100 %] 94 % (04/30 0619) Weight:  [53.7 kg (118 lb 6.2 oz)-56.7 kg (125 lb)] 56.7 kg (125 lb) (04/30 0619)  Intake/Output from previous day: 04/29 0701 - 04/30 0700 In: 0  Out: -45  Intake/Output this shift:   Nutritional status:     Lab Results: Results for orders placed during the hospital encounter of 05/31/13 (from the past 48 hour(s))  GLUCOSE, CAPILLARY     Status: Abnormal   Collection Time    06/01/13 11:31 AM      Result Value Ref Range   Glucose-Capillary 108 (*) 70 - 99 mg/dL   Comment 1 Notify RN    GLUCOSE, CAPILLARY     Status: Abnormal   Collection Time    06/01/13  4:55 PM      Result Value Ref Range   Glucose-Capillary 131 (*) 70 - 99 mg/dL   Comment 1 Notify RN    GLUCOSE, CAPILLARY     Status: None   Collection Time    06/01/13  9:47 PM      Result Value Ref Range   Glucose-Capillary 96  70 - 99 mg/dL  BASIC METABOLIC PANEL     Status: Abnormal   Collection Time    06/02/13  6:13 AM      Result Value Ref Range   Sodium 147  137 - 147 mEq/L   Potassium 3.9  3.7 - 5.3 mEq/L   Chloride 101  96 - 112 mEq/L   CO2 29  19 - 32 mEq/L   Glucose, Bld 76  70 - 99 mg/dL   BUN 17  6 - 23 mg/dL   Creatinine, Ser 3.49 (*) 0.50 - 1.10 mg/dL   Calcium 10.9 (*) 8.4 - 10.5 mg/dL   GFR calc non Af Amer 12 (*) >90 mL/min   GFR calc Af Amer 13 (*) >90 mL/min   Comment: (NOTE)     The eGFR has been calculated using the CKD EPI equation.     This calculation has not been validated in all clinical situations.     eGFR's persistently <90 mL/min signify possible Chronic Kidney     Disease.  GLUCOSE, CAPILLARY     Status: Abnormal   Collection  Time    06/02/13  7:15 AM      Result Value Ref Range   Glucose-Capillary 57 (*) 70 - 99 mg/dL   Comment 1 Notify RN     Comment 2 Documented in Chart    GLUCOSE, CAPILLARY     Status: Abnormal   Collection Time    06/02/13  8:14 AM      Result Value Ref Range   Glucose-Capillary 115 (*) 70 - 99 mg/dL   Comment 1 Notify RN     Comment 2 Documented in Chart    CBC     Status: Abnormal   Collection Time    06/02/13  8:45 AM      Result Value Ref Range   WBC 16.7 (*) 4.0 - 10.5 K/uL   RBC 4.32  3.87 - 5.11 MIL/uL   Hemoglobin 13.1  12.0 - 15.0 g/dL   HCT 40.1  36.0 - 46.0 %   MCV 92.8  78.0 - 100.0 fL   MCH 30.3  26.0 - 34.0 pg   MCHC 32.7  30.0 - 36.0 g/dL   RDW 17.0 (*) 11.5 - 15.5 %   Platelets 247  150 - 400 K/uL  HEPATITIS B SURFACE ANTIGEN     Status: None   Collection Time    06/02/13 12:00 PM      Result Value Ref Range   Hepatitis B Surface Ag NEGATIVE  NEGATIVE   Comment: Performed at [place] X 1 CARD TO LAB, STOOL     Status: Abnormal   Collection Time    06/02/13  2:30 PM      Result Value Ref Range   Fecal Occult Bld POSITIVE (*) NEGATIVE  CLOSTRIDIUM DIFFICILE BY PCR     Status: None   Collection Time    06/02/13  2:30 PM      Result Value Ref Range   C difficile by pcr NEGATIVE  NEGATIVE  GLUCOSE, CAPILLARY     Status: None   Collection Time    06/02/13  4:25 PM      Result Value Ref Range   Glucose-Capillary 76  70 - 99 mg/dL   Comment 1 Notify RN     Comment 2 Documented in Chart    GLUCOSE, CAPILLARY     Status: Abnormal   Collection Time    06/02/13  9:17 PM      Result Value Ref Range   Glucose-Capillary 113 (*) 70 - 99 mg/dL  TROPONIN  I     Status: Abnormal   Collection Time    06/03/13  6:02 AM      Result Value Ref Range   Troponin I 0.52 (*) <0.30 ng/mL   Comment: CRITICAL RESULT CALLED TO, READ BACK BY AND VERIFIED WITH:     BULLIN,M AT 7:20AM ON 06/03/13 BY FESTERMAN,C                Due to the release  kinetics of cTnI,     a negative result within the first hours     of the onset of symptoms does not rule out     myocardial infarction with certainty.     If myocardial infarction is still suspected,     repeat the test at appropriate intervals.  CBC     Status: Abnormal   Collection Time    06/03/13  6:02 AM      Result Value Ref Range   WBC 15.3 (*) 4.0 - 10.5 K/uL   RBC 3.62 (*) 3.87 - 5.11 MIL/uL   Hemoglobin 11.0 (*) 12.0 - 15.0 g/dL   HCT 33.8 (*) 36.0 - 46.0 %   MCV 93.4  78.0 - 100.0 fL   MCH 30.4  26.0 - 34.0 pg   MCHC 32.5  30.0 - 36.0 g/dL   RDW 17.0 (*) 11.5 - 15.5 %   Platelets 270  150 - 400 K/uL  RENAL FUNCTION PANEL     Status: Abnormal   Collection Time    06/03/13  6:03 AM      Result Value Ref Range   Sodium 148 (*) 137 - 147 mEq/L   Potassium 2.9 (*) 3.7 - 5.3 mEq/L   Comment: CRITICAL RESULT CALLED TO, READ BACK BY AND VERIFIED WITH:     BULLIN,M AT 7:30AM ON 06/03/13 BY FESTERMAN,C   Chloride 104  96 - 112 mEq/L   CO2 31  19 - 32 mEq/L   Glucose, Bld 121 (*) 70 - 99 mg/dL   BUN 8  6 - 23 mg/dL   Creatinine, Ser 2.04 (*) 0.50 - 1.10 mg/dL   Comment: DELTA CHECK NOTED   Calcium 9.3  8.4 - 10.5 mg/dL   Phosphorus 3.1  2.3 - 4.6 mg/dL   Albumin 1.2 (*) 3.5 - 5.2 g/dL   GFR calc non Af Amer 22 (*) >90 mL/min   GFR calc Af Amer 26 (*) >90 mL/min   Comment: (NOTE)     The eGFR has been calculated using the CKD EPI equation.     This calculation has not been validated in all clinical situations.     eGFR's persistently <90 mL/min signify possible Chronic Kidney     Disease.  MAGNESIUM     Status: None   Collection Time    06/03/13  6:03 AM      Result Value Ref Range   Magnesium 1.8  1.5 - 2.5 mg/dL  GLUCOSE, CAPILLARY     Status: Abnormal   Collection Time    06/03/13  7:17 AM      Result Value Ref Range   Glucose-Capillary 123 (*) 70 - 99 mg/dL   Comment 1 Notify RN     Comment 2 Documented in Chart      Lipid Panel No results found for this  basename: CHOL, TRIG, HDL, CHOLHDL, VLDL, LDLCALC,  in the last 72 hours  Studies/Results: No results found.  Medications:  Scheduled Meds: . atorvastatin  10 mg Oral q morning -  10a  . carbidopa-levodopa  1.5 tablet Oral TID  . collagenase   Topical Daily  . donepezil  10 mg Oral QHS  . feeding supplement (NEPRO CARB STEADY)  237 mL Oral Q24H  . insulin aspart  0-5 Units Subcutaneous QHS  . insulin aspart  0-9 Units Subcutaneous TID WC  . metoprolol  5 mg Intravenous 4 times per day  . [START ON 06/04/2013] midodrine  10 mg Oral Q M,W,F-HD  . multivitamin  1 tablet Oral BID  . pantoprazole (PROTONIX) IV  40 mg Intravenous Q24H  . pentafluoroprop-tetrafluoroeth  1 application Topical Daily  . piperacillin-tazobactam (ZOSYN)  IV  2.25 g Intravenous Q8H  . potassium chloride  10 mEq Intravenous Q1 Hr x 2  . sodium chloride  3 mL Intravenous Q12H  . sodium chloride  3 mL Intravenous Q12H  . vancomycin  500 mg Intravenous Q M,W,F-HD   Continuous Infusions:  PRN Meds:.sodium chloride, sodium chloride, sodium chloride, feeding supplement (NEPRO CARB STEADY), heparin, heparin, lidocaine (PF), lidocaine-prilocaine, pentafluoroprop-tetrafluoroeth, RESOURCE THICKENUP CLEAR, sodium chloride     LOS: 3 days   Octavia Velador A. [name], M.D.  Diplomate, Tax adviser of Psychiatry and Neurology ( Neurology).

## 2013-06-03 NOTE — Progress Notes (Signed)
Subjective: Interval History: none.  Objective: Vital signs in last 24 hours: Temp:  [97.1 F (36.2 C)-98.2 F (36.8 C)] 97.4 F (36.3 C) (04/30 0619) Pulse Rate:  [80-137] 90 (04/30 0619) Resp:  [16-20] 20 (04/30 0619) BP: (80-148)/(40-105) 123/51 mmHg (04/30 0619) SpO2:  [85 %-100 %] 94 % (04/30 0619) Weight:  [53.7 kg (118 lb 6.2 oz)-56.7 kg (125 lb)] 56.7 kg (125 lb) (04/30 0619) Weight change: 0.7 kg (1 lb 8.7 oz)  Intake/Output from previous day: 04/29 0701 - 04/30 0700 In: 0  Out: -45  Intake/Output this shift:    Generally patient is somolent but arousable. Presently she does not comunicate Chest decreased breath sound otherwise clear Heart examination regular rate and rhythm no murmur Abdomen soft positive bowel sound Extremities no edema  Lab Results:  Recent Labs  06/02/13 0845 06/03/13 0602  WBC 16.7* 15.3*  HGB 13.1 11.0*  HCT 40.1 33.8*  PLT 247 270   BMET:  Recent Labs  06/02/13 0613 06/03/13 0603  NA 147 148*  K 3.9 2.9*  CL 101 104  CO2 29 31  GLUCOSE 76 121*  BUN 17 8  CREATININE 3.49* 2.04*  CALCIUM 10.9* 9.3   No results found for this basename: PTH,  in the last 72 hours Iron Studies: No results found for this basename: IRON, TIBC, TRANSFERRIN, FERRITIN,  in the last 72 hours  Studies/Results: Mr Brain Wo Contrast  06/01/2013   CLINICAL DATA:  80 year old female with altered mental status and weakness. Confusion. Initial encounter.  EXAM: MRI HEAD WITHOUT CONTRAST  TECHNIQUE: Multiplanar, multiecho pulse sequences of the brain and surrounding structures were obtained without intravenous contrast.  COMPARISON:  Head CT without contrast 05/31/2013 and earlier.  FINDINGS: Stable cerebral volume. No midline shift, mass effect, evidence of mass lesion, ventriculomegaly, extra-axial collection or acute intracranial hemorrhage. Cervicomedullary junction and pituitary are within normal limits. Negative visualized cervical spine. Major  intracranial vascular flow voids are stable.  Widespread abnormal T2 and FLAIR hyperintensity in the brain, most resembling sequelae of chronic small vessel disease. There are small chronic infarcts in the cerebellum (right medial cerebellar hemisphere most affected). Evidence of a chronic cortically based infarct in the right frontal operculum noted (series 5 image 16). Chronic lacunar infarct extending from the left corona radiata to the lentiform nuclei. Occasional trace chronic blood products.  On diffusion-weighted imaging today there is a small focus of increased trace diffusion signal in the right gyrus rectus (series 100, image 14 and series 101, image 29), not correlated on ADC. No associated T2 or FLAIR hyperintensity. Burtis Junes this is artifact.  Trace mastoid fluid. Minor paranasal sinus mucosal thickening. Visualized orbit soft tissues are within normal limits. Visualized scalp soft tissues are within normal limits. Normal bone marrow signal.  IMPRESSION: 1. No definite acute intracranial abnormality. A 4 mm focus of increased trace diffusion signal in the right gyrus rectus is felt to be artifact. 2. Chronic  small and medium-sized vessel ischemia.   Electronically Signed   By: Lars Pinks M.D.   On: 06/01/2013 09:35    I have reviewed the patient's current medications.  Assessment/Plan: Problem #1 end-stage renal disease: She status post hemodialysis yesterday. Problem #2 hypokalemia: Potassium is low possibly from poor nutrition Problem #3 after fibrillation: Her heart is controlled Problem #4 dementia : Patient does not committed very well. Problem #5 access disease Problem #6 hypertension Problem #7 anemia: Her hemoglobin and hematocrit is range. Problem #8 metabolic bone disease: Calcium and phosphorus  isn't range. Problem #9 poor nutrition. Patient was severely low albumin. Problem #10 possible sepsis: Patient with hypotension, leukocytosis, low grade fever. Source could be from her  Decubitus ulcer. Patient on antibiotics and her white blood cell count is improving. Plan: We'll make arrangements for patient to get dialysis tomorrow We'll use 4 K./2.5 calcium bath. We'll change her IV fluid to half-normal saline with 20 mEq of KCl at 50 cc per hour. We'll check her basic metabolic panel in the morning.   LOS: 3 days   Marguita Venning S Keiron Iodice 06/03/2013,8:43 AM   Oh.

## 2013-06-03 NOTE — Progress Notes (Signed)
Speech Language Pathology Treatment:    Patient Details Name: Katrina Meyer MRN: [id number] DOB: 1932/05/25 Today's Date: 06/03/2013 Time:  -     Assessment / Plan / Recommendation Clinical Impression  Pt appeared lethargic, but would open eyes to verbal stim. She was unable to follow commands or answer questions.  Pt was given 2 small boluses of ice cream.  No manipulation of the bolus was observed.  Swallow reflex was eventually triggered, and no overt s/s aspiration were observed.  At this time, pt continues unsafe for po intake, including meds.  She has a history of dysphagia, with silent aspiration of thin liquids documented in February 2015.  RN and family report concern regarding lack of nutrition and hydration since admit.  Recommend consideration of feeding tube placement to facilitate adequate, safe nutrition and hydration until pt returns to a state where she is able to take po safely and adequately.  ST will continue to follow for po readiness.     HPI HPI: Katrina Meyer is a 80 yo female who was admitted to [facility] after becoming unresponsive following HD. She had a similar episode at the end of January 2015 and was admitted to [facility]. She had MBSS completed at that time (twice) and was placed on a mech soft diet with NTL due to silent aspiration of thins. Grandaughter reports that pt was recently discharged from [facility] following a 2-week admission. She had been consuming regular textures and thin liquids at home PTA.    Pertinent Vitals VSS  SLP Plan  Continue with current plan of care    Recommendations Medication Administration: Via alternative means              Oral Care Recommendations: Oral care Q4 per protocol Plan: Continue with current plan of care    GO    Katrina Meyer, [place], [place] 06/03/2013, 3:57 PM

## 2013-06-03 NOTE — Progress Notes (Signed)
Patient heart rate elevated in the 140s. Contacted midlevel MD on call. Orders received will continue to monitor.

## 2013-06-03 NOTE — Progress Notes (Signed)
Patient Blood pressure 88/40. Metoprolol not give. Contacted Midlevel MD on call. Received order for 250cc bolus and a one time dose of iv digoxin. Will continue to monitor closely.

## 2013-06-03 NOTE — Progress Notes (Signed)
Patient more alert this morning than yesterday afternoon.  Patient non verbal but will open and follow with eyes.  Patient sipping resource with daughter at bedside without any apparent difficulties earlier this AM.  After coming back from MRI, dressings changed to foot and sacral wounds.  Patient has had 2 loose, green appearing BM's so far today.  Attempted to give meds with thickened resource.  Patient would take sips from cup and swallow.  Attempted to give pills and patient appeared to swallow the first with no problem.  When taking the second, though, patient began keeping mouth closed and would not open for a sip of drink.  Would eventually open mouth with a spoon full of liquid, and pill had dissolved on tongue. At this time, patient no longer opening mouth for drink or pills.  Was able to give carbidopa, but held atorvastatin and renal vitamin at this time.  Speech eval still pending.

## 2013-06-03 NOTE — Progress Notes (Signed)
Patient BP is 93/63. Received order from midlevel MD to hold midnight dose of metoprolol. Will continue to monitor closely.

## 2013-06-03 NOTE — Progress Notes (Signed)
Late Entry .Marland Kitchen From 0745  CRITICAL VALUE ALERT  Critical value received:  Troponin 0.52 & K+ - 2.9  Date of notification:  4/30  Time of notification:  0745  Critical value read back:yes  Nurse who received alert:  Jerrye Noble RN  MD notified (1st page):  Dr. Caryn Section  Time of first page:  0800   MD notified (2nd page):  Time of second page:  Responding MD:  Dr. Caryn Section  Time MD responded:  0800

## 2013-06-03 NOTE — Progress Notes (Signed)
ANTIBIOTIC CONSULT NOTE  Pharmacy Consult for Vancomycin and Zosyn  Indication: rule out sepsis, wound infection, bacteremia  Allergies  Allergen Reactions  . Hydralazine Rash  . Hydrochlorothiazide Hives and Rash   Patient Measurements: Height: 5' 6.93" (170 cm) Weight: 125 lb (56.7 kg) IBW/kg (Calculated) : 61.44  Vital Signs: Temp: 97.4 F (36.3 C) (04/30 0619) Temp src: Axillary (04/30 0619) BP: 139/64 mmHg (04/30 1137) Pulse Rate: 99 (04/30 1137) Intake/Output from previous day: 04/29 0701 - 04/30 0700 In: 0  Out: -45  Intake/Output from this shift:    Labs:  Recent Labs  05/31/13 1827 06/02/13 0613 06/02/13 0845 06/03/13 0602 06/03/13 0603  WBC 19.9*  --  16.7* 15.3*  --   HGB 12.8  --  13.1 11.0*  --   PLT 295  --  247 270  --   CREATININE 2.33* 3.49*  --   --  2.04*   Estimated Creatinine Clearance: 20.3 ml/min (by C-G formula based on Cr of 2.04). No results found for this basename: VANCOTROUGH, VANCOPEAK, VANCORANDOM, GENTTROUGH, GENTPEAK, GENTRANDOM, TOBRATROUGH, TOBRAPEAK, TOBRARND, AMIKACINPEAK, AMIKACINTROU, AMIKACIN,  in the last 72 hours   Microbiology: Recent Results (from the past 720 hour(s))  CULTURE, BLOOD (ROUTINE X 2)     Status: None   Collection Time    05/31/13  6:27 PM      Result Value Ref Range Status   Specimen Description BLOOD LEFT ANTECUBITAL   Final   Special Requests     Final   Value: BOTTLES DRAWN AEROBIC AND ANAEROBIC 6CC  IMMUNE:COMPROMISED   Culture NO GROWTH 3 DAYS   Final   Report Status PENDING   Incomplete  CULTURE, BLOOD (ROUTINE X 2)     Status: None   Collection Time    05/31/13  7:13 PM      Result Value Ref Range Status   Specimen Description BLOOD LEFT HAND   Final   Special Requests BOTTLES DRAWN AEROBIC ONLY 4CC  IMMUNE:COMPROMISED   Final   Culture     Final   Value: GRAM POSITIVE COCCI IN CLUSTERS GRAM POSITIVE COCCI IN PAIRS     Gram Stain Report Called to,Read Back By and Verified With:  BULLINS,MEGAN AT 1039 ON 06/03/2013 BY BAUGHAM,M.     Performed at [facility]   Report Status PENDING   Incomplete  CLOSTRIDIUM DIFFICILE BY PCR     Status: None   Collection Time    06/02/13  2:30 PM      Result Value Ref Range Status   C difficile by pcr NEGATIVE  NEGATIVE Final   Medical History: Past Medical History  Diagnosis Date  . Hyperlipidemia     takes Lipitor daily  . DVT (deep venous thrombosis)   . Stroke   . Parkinson's disease     takes Sinemet daily  . Tibia/fibula fracture 01/04/2012    Treated with long leg casting. Cast change in hospital 02/27/12   . Peripheral vascular disease 02/27/2012    Skin ulcer of right great toe  . Atrial fibrillation 03/09/2012    Seen on older EKG as well but family not aware.   . Macrocytic anemia 03/09/2012  . Lower GI bleed 03/10/2012    hematochezia secondary to ulcerated polyps; diverticulosis; chronic diarrhea  . End stage renal disease on dialysis     [facility]- MWF  . Hypertension     takes Coreg daily  . Dementia     takes Aricept daily  .  Diabetes mellitus, type II     takes Lantus and Novolog  . Seasonal allergies     takes Allegra daily  . Depression     takes Prozac daily  . Hypothyroidism     takes Synthroid daily   Medications:  Scheduled:  . atorvastatin  10 mg Oral q morning - 10a  . carbidopa-levodopa  1.5 tablet Oral TID  . collagenase   Topical Daily  . donepezil  10 mg Oral QHS  . feeding supplement (NEPRO CARB STEADY)  237 mL Oral Q24H  . insulin aspart  0-5 Units Subcutaneous QHS  . insulin aspart  0-9 Units Subcutaneous TID WC  . metoprolol  5 mg Intravenous 4 times per day  . [START ON 06/04/2013] midodrine  10 mg Oral Q M,W,F-HD  . multivitamin  1 tablet Oral BID  . pantoprazole (PROTONIX) IV  40 mg Intravenous Q24H  . pentafluoroprop-tetrafluoroeth  1 application Topical Daily  . piperacillin-tazobactam (ZOSYN)  IV  2.25 g Intravenous Q8H  . sodium chloride  3 mL Intravenous  Q12H  . sodium chloride  3 mL Intravenous Q12H  . vancomycin  500 mg Intravenous Q M,W,F-HD   Assessment: 80 yo F with ESRD (HD on MWF).  She was admitted with fever of unknown origin.  WBC remains elevated, but she has been afebrile today.  Empiric antibiotics started on admission.  1 of 2 blood cultures positive for GPC.   Zosyn 4/27 >> Vancomycin 4/27 >>  Goal of Therapy:  Pre-Hemodialysis Vancomycin level goal range =15-25 mcg/ml. Eradicate infection.   Plan:  Vancomycin 500mg  IV after each HD Zosyn 2.25gm IV every 8 hours. Measure antibiotic drug levels at steady state Follow up culture results  Ena Dawley 06/03/2013,2:07 PM

## 2013-06-03 NOTE — Progress Notes (Signed)
[facility] PROGRESS NOTE  Katrina Meyer OXB:[id number] DOB: 20-May-1932 DOA: 05/31/2013 PCP: Glenda Chroman., MD  Assessment/Plan: Acute encephalopathy: etiology remains uncertain. Family  reports patient communicated briefly yesterday suggesting possibly she is deciding when to talk. Await EEG to evaluate for seizure. Await MRI right heel as assessment of wound care RN concerning for deeper infection.  MRI results negative for stroke. Remains afebrile. Blood cultures with gram positive cocci in 1 sample no growth in second sample. Likely contaminate. Esr 59. TSH 0.231,RPR within the limits of normal and B12 1725. Vanc and zoxyn day #4. Leukocytosis continues to trend downward. Holding home synthroid for now. Await EEG.  Remains NPO due to pocketing of food. ST unable to evaluate yesterday.   Hypotension: Improved this am. Troponin 0.52 may be related to dialysis. No events on tele. Echo with EF 70% and moderate LVH and good wall motion.  Continue scheduled IV metoprolol with parameters.  Home meds include coreg. Plan to resume this once swallow evaluated. Will monitor.   Hypokalemia: likely related to GI losses in setting of hemodialysis. Will replete and recheck.   Elevated troponin: o.52. Await EKG. Echo with EF 70% and good wall motion. Mild LVH. May be related to HR and/or dialysis but cannot exclude ischemia. Will provide asa and continue statin. Given her current debilitative state, not likely candidate for stress test or cath. Therefore will manage conservatively.   Afib: rapid rate last evening. Given 250cc bolus NS and 0.166m digoxin IV. Currently rate controlled.  Home meds include coreg, plavix in spite of hx GI bleed.   Skin ulcers: wound consult. Right foot and buttocks. WOC evaluated right heel and opined right heel very fluctuant and concerning for deeper tissue damage. Will obtain MRI to r/o osteomyelitis.   ESRD on HD M, W, F : appreciate nephrology assistance. No dialysis  today   Dm2: A1c 6.0. CBG 113-123. Plan for repeat swallow evaluation. If unable to increase po intake will consider gentle IV fluids with D5W. She remains on sensitive SSI.   Diarrhea/melena: family relates patient frequently gets diarrhea with antibiotics. Stool negative for cdiff. Will obtain GI pathogen panel. If clear will consider immodium.  PAD. Followed by vascular for chronic ischemic ulcers on LE . S/p left AKA   Code Status: full Family Communication: daughter at bedside Disposition Plan: home when ready   Consultants:  Nephrology  neurology  Procedures: 2 d-echo:  Vigorous systolic function with obliteration of the LV cavity, EF > 70%. The cavity size was mildly reduced. Wall thickness was increased in a pattern of moderate LVH. Wall motion was normal; there were no regional wall motion abnormalities. The study was not technically sufficient to allow evaluation of LV diastolic dysfunction due to atrial fibrillation   Antibiotics: Vancomycin 05/31/13>.  Zosyn 05/31/13>>  HPI/Subjective: Lying in bed eyes closed. Opens eyes to light touch. Will not follow commands. Tracks with eyes  Objective: Filed Vitals:   06/03/13 0619  BP: 123/51  Pulse: 90  Temp: 97.4 F (36.3 C)  Resp: 20    Intake/Output Summary (Last 24 hours) at 06/03/13 0855 Last data filed at 06/02/13 1700  Gross per 24 hour  Intake      0 ml  Output    -45 ml  Net     45 ml   Filed Weights   06/02/13 0535 06/02/13 1045 06/03/13 0619  Weight: 55.4 kg (122 lb 2.2 oz) 53.7 kg (118 lb 6.2 oz) 56.7 kg (125 lb)  Exam:   General:  Well nourished appears chronically ill  Cardiovascular: irreglularly irregular +murmur No LE edema on right. Left DKA well healed  Respiratory: normal effort slightly shallow. BS distant but clear no wheeze  Abdomen: soft +BS non-distended non-tender no guarding or rebounding  Musculoskeletal: no clubbing or cyanosis  Skin: dressing right heel dry and  intact. Dressing right hand dry and intact   Data Reviewed: Basic Metabolic Panel:  Recent Labs Lab 05/31/13 1827 06/02/13 0613 06/03/13 0603  NA 145 147 148*  K 3.5* 3.9 2.9*  CL 98 101 104  CO2 30 29 31   GLUCOSE 109* 76 121*  BUN 7 17 8   CREATININE 2.33* 3.49* 2.04*  CALCIUM 10.7* 10.9* 9.3  MG  --   --  1.8  PHOS  --   --  3.1   Liver Function Tests:  Recent Labs Lab 05/31/13 1831 06/03/13 0603  AST 20  --   ALT 5  --   ALKPHOS 190*  --   BILITOT 0.6  --   PROT 5.7*  --   ALBUMIN 1.6* 1.2*   No results found for this basename: LIPASE, AMYLASE,  in the last 168 hours  Recent Labs Lab 05/31/13 2140  AMMONIA 10*   CBC:  Recent Labs Lab 05/31/13 1827 06/02/13 0845 06/03/13 0602  WBC 19.9* 16.7* 15.3*  NEUTROABS 16.0*  --   --   HGB 12.8 13.1 11.0*  HCT 38.4 40.1 33.8*  MCV 93.4 92.8 93.4  PLT 295 247 270   Cardiac Enzymes:  Recent Labs Lab 05/31/13 1827 05/31/13 2040 06/01/13 0603 06/03/13 0602  CKTOTAL  --  36  --   --   CKMB  --  2.3  --   --   TROPONINI <0.30  --  0.48* 0.52*   BNP (last 3 results)  Recent Labs  03/02/13 1645  PROBNP 13840.0*   CBG:  Recent Labs Lab 06/02/13 0715 06/02/13 0814 06/02/13 1625 06/02/13 2117 06/03/13 0717  GLUCAP 57* 115* 76 113* 123*    Recent Results (from the past 240 hour(s))  CULTURE, BLOOD (ROUTINE X 2)     Status: None   Collection Time    05/31/13  6:27 PM      Result Value Ref Range Status   Specimen Description BLOOD LEFT ANTECUBITAL   Final   Special Requests     Final   Value: BOTTLES DRAWN AEROBIC AND ANAEROBIC 6CC  IMMUNE:COMPROMISED   Culture NO GROWTH 2 DAYS   Final   Report Status PENDING   Incomplete  CULTURE, BLOOD (ROUTINE X 2)     Status: None   Collection Time    05/31/13  7:13 PM      Result Value Ref Range Status   Specimen Description BLOOD LEFT HAND   Final   Special Requests BOTTLES DRAWN AEROBIC ONLY 4CC  IMMUNE:COMPROMISED   Final   Culture NO GROWTH 2  DAYS   Final   Report Status PENDING   Incomplete  CLOSTRIDIUM DIFFICILE BY PCR     Status: None   Collection Time    06/02/13  2:30 PM      Result Value Ref Range Status   C difficile by pcr NEGATIVE  NEGATIVE Final     Studies: Mr Brain Wo Contrast  06/01/2013   CLINICAL DATA:  80 year old female with altered mental status and weakness. Confusion. Initial encounter.  EXAM: MRI HEAD WITHOUT CONTRAST  TECHNIQUE: Multiplanar, multiecho pulse sequences of the brain and  surrounding structures were obtained without intravenous contrast.  COMPARISON:  Head CT without contrast 05/31/2013 and earlier.  FINDINGS: Stable cerebral volume. No midline shift, mass effect, evidence of mass lesion, ventriculomegaly, extra-axial collection or acute intracranial hemorrhage. Cervicomedullary junction and pituitary are within normal limits. Negative visualized cervical spine. Major intracranial vascular flow voids are stable.  Widespread abnormal T2 and FLAIR hyperintensity in the brain, most resembling sequelae of chronic small vessel disease. There are small chronic infarcts in the cerebellum (right medial cerebellar hemisphere most affected). Evidence of a chronic cortically based infarct in the right frontal operculum noted (series 5 image 16). Chronic lacunar infarct extending from the left corona radiata to the lentiform nuclei. Occasional trace chronic blood products.  On diffusion-weighted imaging today there is a small focus of increased trace diffusion signal in the right gyrus rectus (series 100, image 14 and series 101, image 29), not correlated on ADC. No associated T2 or FLAIR hyperintensity. Burtis Junes this is artifact.  Trace mastoid fluid. Minor paranasal sinus mucosal thickening. Visualized orbit soft tissues are within normal limits. Visualized scalp soft tissues are within normal limits. Normal bone marrow signal.  IMPRESSION: 1. No definite acute intracranial abnormality. A 4 mm focus of increased trace  diffusion signal in the right gyrus rectus is felt to be artifact. 2. Chronic  small and medium-sized vessel ischemia.   Electronically Signed   By: Lars Pinks M.D.   On: 06/01/2013 09:35    Scheduled Meds: . atorvastatin  10 mg Oral q morning - 10a  . carbidopa-levodopa  1.5 tablet Oral TID  . collagenase   Topical Daily  . donepezil  10 mg Oral QHS  . feeding supplement (NEPRO CARB STEADY)  237 mL Oral Q24H  . insulin aspart  0-5 Units Subcutaneous QHS  . insulin aspart  0-9 Units Subcutaneous TID WC  . metoprolol  5 mg Intravenous 4 times per day  . [START ON 06/04/2013] midodrine  10 mg Oral Q M,W,F-HD  . multivitamin  1 tablet Oral BID  . pantoprazole (PROTONIX) IV  40 mg Intravenous Q24H  . pentafluoroprop-tetrafluoroeth  1 application Topical Daily  . piperacillin-tazobactam (ZOSYN)  IV  2.25 g Intravenous Q8H  . potassium chloride  10 mEq Intravenous Q1 Hr x 2  . sodium chloride  3 mL Intravenous Q12H  . sodium chloride  3 mL Intravenous Q12H  . vancomycin  500 mg Intravenous Q M,W,F-HD   Continuous Infusions: . sodium chloride 45 mL/hr at 06/02/13 1830    Principal Problem:   Abscess of heel, left Active Problems:   Atrial fibrillation   Parkinson's disease   CKD (chronic kidney disease) stage V requiring chronic dialysis   Hypertension   Dementia   Acute encephalopathy   Protein-calorie malnutrition, severe   Hypoglycemia   Leukocytosis, unspecified   Elevated troponin I level   Melena    Time spent: 35 minutes    [facility] Pager [phone]. If 7PM-7AM, please contact night-coverage at [email or web address], password [facility] 06/03/2013, 8:55 AM  LOS: 3 days

## 2013-06-04 DIAGNOSIS — I739 Peripheral vascular disease, unspecified: Secondary | ICD-10-CM

## 2013-06-04 DIAGNOSIS — G20 Parkinson's disease: Secondary | ICD-10-CM

## 2013-06-04 LAB — BASIC METABOLIC PANEL
BUN: 12 mg/dL (ref 6–23)
CHLORIDE: 105 meq/L (ref 96–112)
CO2: 27 meq/L (ref 19–32)
Calcium: 9.7 mg/dL (ref 8.4–10.5)
Creatinine, Ser: 2.74 mg/dL — ABNORMAL HIGH (ref 0.50–1.10)
GFR calc Af Amer: 18 mL/min — ABNORMAL LOW (ref >90)
GFR calc non Af Amer: 16 mL/min — ABNORMAL LOW (ref >90)
Glucose, Bld: 135 mg/dL — ABNORMAL HIGH (ref 70–99)
Potassium: 3.1 mEq/L — ABNORMAL LOW (ref 3.7–5.3)
Sodium: 147 mEq/L (ref 137–147)

## 2013-06-04 LAB — CBC
HCT: 34.8 % — ABNORMAL LOW (ref 36.0–46.0)
Hemoglobin: 11.2 g/dL — ABNORMAL LOW (ref 12.0–15.0)
MCH: 30.2 pg (ref 26.0–34.0)
MCHC: 32.2 g/dL (ref 30.0–36.0)
MCV: 93.8 fL (ref 78.0–100.0)
Platelets: 266 10*3/uL (ref 150–400)
RBC: 3.71 MIL/uL — AB (ref 3.87–5.11)
RDW: 17.3 % — ABNORMAL HIGH (ref 11.5–15.5)
WBC: 17.1 10*3/uL — AB (ref 4.0–10.5)

## 2013-06-04 LAB — GLUCOSE, CAPILLARY
GLUCOSE-CAPILLARY: 192 mg/dL — AB (ref 70–99)
GLUCOSE-CAPILLARY: 78 mg/dL (ref 70–99)
Glucose-Capillary: 232 mg/dL — ABNORMAL HIGH (ref 70–99)
Glucose-Capillary: 134 mg/dL — ABNORMAL HIGH (ref 70–99)
Glucose-Capillary: 126 mg/dL — ABNORMAL HIGH (ref 70–99)
Glucose-Capillary: 102 mg/dL — ABNORMAL HIGH (ref 70–99)
Glucose-Capillary: 156 mg/dL — ABNORMAL HIGH (ref 70–99)

## 2013-06-04 LAB — GI PATHOGEN PANEL BY PCR, STOOL
C DIFFICILE TOXIN A/B: NEGATIVE
CAMPYLOBACTER BY PCR: NEGATIVE
CRYPTOSPORIDIUM BY PCR: NEGATIVE
E coli (ETEC) LT/ST: NEGATIVE
E coli (STEC): NEGATIVE
E coli 0157 by PCR: NEGATIVE
G lamblia by PCR: NEGATIVE
Norovirus GI/GII: NEGATIVE
ROTAVIRUS A BY PCR: NEGATIVE
Salmonella by PCR: NEGATIVE
Shigella by PCR: NEGATIVE

## 2013-06-04 LAB — T4, FREE: FREE T4: 0.99 ng/dL (ref 0.80–1.80)

## 2013-06-04 MED ORDER — SODIUM CHLORIDE 0.9 % IV SOLN: 100.0000 mL | INTRAVENOUS | Status: DC | PRN

## 2013-06-04 MED ORDER — METRONIDAZOLE 250 MG PO TABS
250.0000 mg | ORAL_TABLET | Freq: Three times a day (TID) | ORAL | Status: DC
  Administered 2013-06-04 – 2013-06-07 (×9): 250 mg via ORAL
  Filled 2013-06-04 (×15): qty 1

## 2013-06-04 MED ORDER — BOOST / RESOURCE BREEZE PO LIQD
1.0000 | Freq: Three times a day (TID) | ORAL | Status: DC
  Administered 2013-06-04: 1 via ORAL

## 2013-06-04 MED ORDER — METRONIDAZOLE IVPB CUSTOM
250.0000 mg | Freq: Three times a day (TID) | INTRAVENOUS | Status: DC
  Filled 2013-06-04: qty 50

## 2013-06-04 MED ORDER — DEXTROSE 5 % IV SOLN
2.0000 g | INTRAVENOUS | Status: DC
  Administered 2013-06-04: 2 g via INTRAVENOUS
  Filled 2013-06-04 (×3): qty 2

## 2013-06-04 MED ORDER — ALTEPLASE 2 MG IJ SOLR
2.0000 mg | Freq: Once | INTRAMUSCULAR | Status: AC | PRN
  Filled 2013-06-04: qty 2

## 2013-06-04 MED ORDER — POTASSIUM CHLORIDE 10 MEQ/100ML IV SOLN
10.0000 meq | INTRAVENOUS | Status: AC
  Administered 2013-06-04 (×3): 10 meq via INTRAVENOUS
  Filled 2013-06-04: qty 100

## 2013-06-04 MED ORDER — CARBIDOPA-LEVODOPA 25-100 MG PO TABS
2.0000 | ORAL_TABLET | Freq: Three times a day (TID) | ORAL | Status: DC
  Administered 2013-06-04 – 2013-06-17 (×35): 2 via ORAL
  Filled 2013-06-04 (×3): qty 2
  Filled 2013-06-04: qty 1
  Filled 2013-06-04 (×38): qty 2

## 2013-06-04 NOTE — Progress Notes (Signed)
Patient blood sugar was 136. Accucheck machine would not recognize patient ID so had to override.

## 2013-06-04 NOTE — Procedures (Signed)
[place] A. [name], MD     [email or web address]           HISTORY: This is a 80 year old female who presents with altered mental status. The study done to evaluate for possible seizure activity.  MEDICATIONS: Scheduled Meds: . acidophilus  1 capsule Oral Daily  . atorvastatin  10 mg Oral q morning - 10a  . carbidopa-levodopa  2 tablet Oral TID  . ceFEPime (MAXIPIME) IV  2 g Intravenous Q M,W,F-HD  . clopidogrel  75 mg Oral Q breakfast  . collagenase   Topical Daily  . donepezil  10 mg Oral QHS  . feeding supplement (RESOURCE BREEZE)  1 Container Oral TID BM  . insulin aspart  0-5 Units Subcutaneous QHS  . insulin aspart  0-9 Units Subcutaneous TID WC  . loperamide  2 mg Oral BID  . metoprolol  5 mg Intravenous 4 times per day  . metroNIDAZOLE  250 mg Oral 3 times per day  . midodrine  10 mg Oral Q M,W,F-HD  . multivitamin  1 tablet Oral BID  . pantoprazole (PROTONIX) IV  40 mg Intravenous Q24H  . pentafluoroprop-tetrafluoroeth  1 application Topical Daily  . sodium chloride  3 mL Intravenous Q12H  . sodium chloride  3 mL Intravenous Q12H  . vancomycin  500 mg Intravenous Q M,W,F-HD   Continuous Infusions:  PRN Meds:.sodium chloride, sodium chloride, sodium chloride, sodium chloride, sodium chloride, alteplase, feeding supplement (NEPRO CARB STEADY), heparin, heparin, lidocaine (PF), lidocaine-prilocaine, pentafluoroprop-tetrafluoroeth, RESOURCE THICKENUP CLEAR, sodium chloride  Prior to Admission medications   Medication Sig Start Date End Date Taking? Authorizing Provider  atorvastatin (LIPITOR) 10 MG tablet Take 10 mg by mouth every morning.    Yes Historical Provider, MD  carbidopa-levodopa (SINEMET IR) 25-100 MG per tablet Take 1.5 tablets by mouth 3 (three) times daily.   Yes Historical Provider, MD  carvedilol (COREG) 6.25 MG tablet Take 18.75 mg by mouth 2 (two) times daily with a meal. Does not take morning dose on dialysis days (M-W-F) 02/17/13   Yes Geradine Girt, DO  cinacalcet (SENSIPAR) 30 MG tablet Take 30 mg by mouth every evening.    Yes Historical Provider, MD  clopidogrel (PLAVIX) 75 MG tablet Take 75 mg by mouth daily. 05/24/13  Yes Historical Provider, MD  collagenase (SANTYL) ointment Apply 1 application topically daily. 05/29/13  Yes Historical Provider, MD  donepezil (ARICEPT) 10 MG tablet Take 10 mg by mouth at bedtime.    Yes Historical Provider, MD  FLUoxetine (PROZAC) 40 MG capsule Take 40 mg by mouth every morning.    Yes Historical Provider, MD  Breck Coons AND STRETCH AERO Apply 1 application topically daily. Uses at Dialysis treatments 05/06/13  Yes Historical Provider, MD  HYDROcodone-acetaminophen (NORCO/VICODIN) 5-325 MG per tablet Take 1 tablet by mouth every 4 (four) hours as needed. For pain 05/29/13  Yes Historical Provider, MD  insulin glargine (LANTUS) 100 UNIT/ML injection Inject 1-5 Units into the skin at bedtime as needed. Per Sliding Scale   Yes Historical Provider, MD  insulin lispro (HUMALOG) 100 UNIT/ML injection Inject 1-4 Units into the skin 3 (three) times daily with meals. 200-250= 1 unit 251-300= 2 unit 301-350= 3 unit 351-400= 4 unit 05/29/13 05/29/14 Yes Historical Provider, MD  levofloxacin (LEVAQUIN) 500 MG tablet Take 500 mg by mouth daily. 14 day course starting on 05/30/2013 05/30/13 2013-06-27 Yes Historical Provider, MD  levothyroxine (SYNTHROID, LEVOTHROID) 25 MCG tablet Take 25 mcg by mouth every morning.  Yes Historical Provider, MD  Maltodextrin-Xanthan Gum ([place]) POWD To thicken liquids to nectar thick 03/09/13  Yes Geradine Girt, DO  multivitamin (RENA-VIT) TABS tablet Take 1 tablet by mouth 2 (two) times daily.    Yes Historical Provider, MD  Nutritional Supplements (FEEDING SUPPLEMENT, NEPRO CARB STEADY,) LIQD Take 237 mLs by mouth daily. 02/17/13  Yes Geradine Girt, DO  Vitamin D, Ergocalciferol, (DRISDOL) 50000 UNITS CAPS capsule Take 1 capsule by mouth once a week.  03/17/13  Yes Historical Provider, MD      ANALYSIS: A 16 channel recording using standard 10 20 measurements is conducted for 21 minutes. The recording shows significant generalized slowing which gets as high as 6-7 Hz. However, the recording shows delta slowing from time to time. Photic stimulation and hyperventilation were not carried out. There are no focal or lateralized slowing. There are no epileptiform activities observed.    IMPRESSION: 1. This recording shows moderate generalized slowing. However, there are no epileptiform activity observed.      Ashaz Robling A. [name], M.D.  Diplomate, Tax adviser of Psychiatry and Neurology ( Neurology).

## 2013-06-04 NOTE — Progress Notes (Signed)
NUTRITION FOLLOW UP  Intervention:   If artifical means of nutrition are considered, recommend: start Nepro @ 20 ml/hr and advance 10 ml/hr every 4 hours to goal rate of 50 ml/hr. This will provies 2160 kcals, 97 grams protein, and 872 ml fluid at goal rate.  Also recommend 35 ml flush every 6 hours.  Total regimen will meet 100% of estimated calorie, protein, and fluid needs.  Nutrition Dx:   Inadequate oral intake related to decreased responsiveness as evidenced by NPO; ongoing  Goal:   Pt will meet >90% of estimated nutritional needs; goal not met  Monitor:   Diet advancement, PO intake, labs, skin assessments, I/O's  Assessment:   Intake continues to be poor due to periods of unresponsiveness. Pt has been advanced to a full liquid diet, however, nursing staff has been given specific instructions to only feed pt when she is alert and upright. Intake remains poor; PO: 0-50%.  Noted a 7# (5.9%) wt gain x 4 days, which is clinically significant, however, likely due to fluid changes from HD.  SLP has been working with patient and recommend nutrition via alternative means, due to pt's unresponsiveness. Pt discharge disposition is home with home health.   Height: Ht Readings from Last 1 Encounters:  05/31/13 5' 6.93" (1.7 m)    Weight Status:   Wt Readings from Last 1 Encounters:  06/03/13 125 lb (56.7 kg)    Re-estimated needs:  Kcal: 5189-8421 daily Protein: 85-113 grams daily Fluid: 1-1.5 L daily  Skin: unstageable pressure ulcer to rt heel. Stage II pressure ulcer rt sacrum, stage II pressure ulcer on lt sacrum x 2, wound on lt toe  Diet Order: Full Liquid   Intake/Output Summary (Last 24 hours) at 06/04/13 1619 Last data filed at 06/04/13 1242  Gross per 24 hour  Intake    120 ml  Output      0 ml  Net    120 ml    Last BM: 06/03/13   Labs:   Recent Labs Lab 06/02/13 0613 06/03/13 0603 06/04/13 0557  NA 147 148* 147  K 3.9 2.9* 3.1*  CL 101 104 105   CO2 29 31 27   BUN 17 8 12   CREATININE 3.49* 2.04* 2.74*  CALCIUM 10.9* 9.3 9.7  MG  --  1.8  --   PHOS  --  3.1  --   GLUCOSE 76 121* 135*    CBG (last 3)   Recent Labs  06/03/13 2317 06/04/13 0738 06/04/13 1130  GLUCAP 134* 126* 102*    Scheduled Meds: . acidophilus  1 capsule Oral Daily  . atorvastatin  10 mg Oral q morning - 10a  . carbidopa-levodopa  1.5 tablet Oral TID  . ceFEPime (MAXIPIME) IV  2 g Intravenous Q M,W,F-HD  . clopidogrel  75 mg Oral Q breakfast  . collagenase   Topical Daily  . donepezil  10 mg Oral QHS  . feeding supplement (NEPRO CARB STEADY)  237 mL Oral Q24H  . insulin aspart  0-5 Units Subcutaneous QHS  . insulin aspart  0-9 Units Subcutaneous TID WC  . loperamide  2 mg Oral BID  . metoprolol  5 mg Intravenous 4 times per day  . metroNIDAZOLE  250 mg Oral 3 times per day  . midodrine  10 mg Oral Q M,W,F-HD  . multivitamin  1 tablet Oral BID  . pantoprazole (PROTONIX) IV  40 mg Intravenous Q24H  . pentafluoroprop-tetrafluoroeth  1 application Topical Daily  . sodium  chloride  3 mL Intravenous Q12H  . sodium chloride  3 mL Intravenous Q12H  . vancomycin  500 mg Intravenous Q M,W,F-HD    Continuous Infusions:   Katrina Meyer Katrina Meyer, RD, LDN Pager: [phone]

## 2013-06-04 NOTE — Progress Notes (Signed)
Subjective: Interval History: none.  Objective: Vital signs in last 24 hours: Temp:  [98 F (36.7 C)-98.5 F (36.9 C)] 98 F (36.7 C) (05/01 0542) Pulse Rate:  [94-109] 109 (05/01 0542) Resp:  [18-20] 20 (05/01 0542) BP: (104-139)/(60-84) 104/60 mmHg (05/01 0542) SpO2:  [94 %-100 %] 100 % (05/01 0542) Weight change:   Intake/Output from previous day:   Intake/Output this shift: Total I/O In: 120 [P.O.:120] Out: -   Generally patient is alert today but still does not give any answer to questions and her daughter was present in her room and answer some of the question she has particularly on the issue of hypotension on dialyss Chest decreased breath sound otherwise clear Heart examination regular rate and rhythm no murmur Abdomen soft positive bowel sound Extremities no edema  Lab Results:  Recent Labs  06/03/13 0602 06/04/13 0557  WBC 15.3* 17.1*  HGB 11.0* 11.2*  HCT 33.8* 34.8*  PLT 270 266   BMET:   Recent Labs  06/03/13 0603 06/04/13 0557  NA 148* 147  K 2.9* 3.1*  CL 104 105  CO2 31 27  GLUCOSE 121* 135*  BUN 8 12  CREATININE 2.04* 2.74*  CALCIUM 9.3 9.7   No results found for this basename: PTH,  in the last 72 hours Iron Studies: No results found for this basename: IRON, TIBC, TRANSFERRIN, FERRITIN,  in the last 72 hours  Studies/Results: Mr Heel Right Wo Contrast  06/03/2013   CLINICAL DATA:  Diabetic patient with a skin ulcer over the right heel.  EXAM: MR OF THE RIGHT HEEL WITHOUT CONTRAST  TECHNIQUE: Multiplanar, multisequence MR imaging was performed. No intravenous contrast was administered.  COMPARISON:  None.  FINDINGS: A skin ulceration is seen over the lateral margin of the right heel posteriorly. There is marrow edema in the underlying calcaneus involving an area measuring approximately 2.1 cm AP by 1.5 cm transverse by 1.4 cm craniocaudal compatible with osteomyelitis. [name] wound appears to extend almost to cortical bone. No fluid  collection is identified. Bone marrow signal is otherwise unremarkable.  All visualized tendons and ligaments appear intact. The sinus tarsus and plantar fascia are unremarkable. No osteochondral lesion of the talar dome is identified.  IMPRESSION: Skin ulceration over the lateral aspect of the right heel with underlying marrow signal abnormality in the calcaneus compatible with osteomyelitis. Negative for abscess.   Electronically Signed   By: Inge Rise M.D.   On: 06/03/2013 11:03    I have reviewed the patient's current medications.  Assessment/Plan: Problem #1 end-stage renal disease: She status post hemodialysis the day before yesterday. Problem #2 hypokalemia: Potassium is low but improving Problem #3 after fibrillation: Her heart is controlled Problem #4 dementia : Patient does not committed very well. Problem #5 access disease Problem #6 hypotension her blood pressure is stable Problem #7 anemia: Her hemoglobin and hematocrit is range. Problem #8 metabolic bone disease: Calcium and phosphorus is in  range. Problem #9 poor nutrition. Patient was severely low albumin. Problem #10 possible sepsis: Patient with hypotension, leukocytosis, low grade fever. Source could be from her Decubitus ulcer. Patient on antibiotics and her white blood cell count is worsening today but presently she is afebrille Plan: We'll make arrangements for patient to get dialysis today We'll use 4 K./2.5 calcium bath. We'll check her basic metabolic panel and phosphorus  in the morning.   LOS: 4 days   Katrina Meyer S Robben Jagiello 06/04/2013,11:07 AM   Oh.

## 2013-06-04 NOTE — Progress Notes (Signed)
[facility] PROGRESS NOTE  Katrina Meyer DPO:[id number] DOB: 1932-12-12 DOA: 05/31/2013 PCP: Glenda Chroman., MD  Assessment/Plan: Acute encephalopathy. Dr. [name] assessment noted. Dr. [name] opined that the patient's cognitive deficit or dementia and likely progressive Parkinson's disease are likely contributing to her mutism and intermittent unresponsiveness. Given her history of previous strokes, multi-infarct or vascular dementia is likely. Per Katrina Meyer, the patient has long-standing dementia and Parkinson's disease. At baseline, the patient talks intermittently and often she does not talk at all. This is  person or individual dependent. EEG pending. She is alert this morning. She makes eye contact and slightly resistant to exam.  History of strokes/vascular dementia. No new strokes per MRI. Stool guaiac positive, but she is on PPI.  Plavix resumed. Ammonia level within normal limits. Vitamin B12 level elevated, not deficient. RPR nonreactive. HIV nonreactive.  Right heel osteomyelitis. This was confirmed per MRI. Patient on antibiotics from 05/13/13-05/30/13 at [facility]. Has been on vancomycin and Zosyn 4 4 days. Discussed case with Dr. Megan Salon of ID who recommended that given length of antibiotics and no culture data back on right heel in setting of profuse diarrhea that vancomycin, flagy and cefipime could be given. Recommended flagy to be po if possible. Will adjust antibiotics accordingly. Will obtain culture from right heel. Dr. Megan Salon indicated then length of treatment will depend on culture and sensitivity data. May need to "restart the clock" if growing something resistant.   She could potentially receive vancomycin outpatient for 3-6 weeks at the dialysis center.  Multiple pressure/ischemic ulcerations- wounds. Wound care consultation and recommendations noted. Status post sharp debridement, Santyl ointment, dressing changes, air mattress, prolonged boot, etc. Obtain cultures from  right heel. The patient continues to have loose stools/diarrhea which are greenish [name] in color. Per her daughter, Katrina Meyer, the patient has a history of antibiotic-induced diarrhea. C. difficile PCR is negative. The remainder of the GI panel is pending. Continue  Imodium twice a day. Add Flagyl.   Elevated troponin I. This may be related to tachycardia with rapid atrial fibrillation and/or underlying decreased clearance in the setting of end-stage renal disease. 2-D echocardiogram revealed no left ventricular wall motion abnormalities and the patient had preserved LV function. Given her chronic debilitation and dementia, aggressive diagnostic workup deferred. Continue Plavix and continue statin and IV beta blocker. When she is taking oral medications more consistently, we'll restart carvedilol orally.  Atrial fibrillation with rapid ventricular response. Her heart rate range 94-109. continue IV metoprolol. Plavix resumed and plan restart carvedilol when she is taking oral medications more consistently.   Query dysphagia versus poor oral intake due to to dementia status Parkinson's disease. Speech therapy consultation/evaluation pending. Patient will take full liquids depending on who presents it.  The nursing staff was given specific instructions to not feed the patient unless she was totally alert and able to follow directions. Also encourage assistance of family as patient tends to take more from them.  Hypotension following hemodialysis. This is likely chronic. Midodrine has been added on hemodialysis days per nephrology.   Hypothyroidism. [name] TSH is slightly low. Synthroid is being held. Await free T4 and consider resuming Synthroid orally or IV depending on free T4.   Hyponatremia:Likely related to gi losses in setting of decreased po intake. Will replete intravenously. Recheck  ESRD: MWF dialysis. Appreciate nephrology assistance. Dialysis today  Diabetes: CBG range 126-134. Continue  SSI. HgA1c 6.0    Code Status: full Family Communication: daughter at bedside Disposition Plan: home when ready  Consultants: Nephrology  neurology   Procedures: 2 d-echo:  Vigorous systolic function with obliteration of the LV cavity, EF > 70%. The cavity size was mildly reduced. Wall thickness was increased in a pattern of moderate LVH. Wall motion was normal; there were no regional wall motion abnormalities. The study was not technically sufficient to allow evaluation of LV diastolic dysfunction due to atrial fibrillation    Antibiotics: Vancomycin 05/31/13>.  Zosyn 05/31/13>>45/1/15 Flagyl 06/04/13>>> cefipime 06/04/13>>>  HPI/Subjective: Awake alert, frowning. Pulls hand away during exam.   Objective: Filed Vitals:   06/04/13 0542  BP: 104/60  Pulse: 109  Temp: 98 F (36.7 C)  Resp: 20    Intake/Output Summary (Last 24 hours) at 06/04/13 1018 Last data filed at 06/04/13 1002  Gross per 24 hour  Intake    120 ml  Output      0 ml  Net    120 ml   Filed Weights   06/02/13 0535 06/02/13 1045 06/03/13 0619  Weight: 55.4 kg (122 lb 2.2 oz) 53.7 kg (118 lb 6.2 oz) 56.7 kg (125 lb)    Exam:   General:  Well nourished appears somewhat uncomfortable  Cardiovascular: irregularly irregular +murmur no gallup no LE edema on right left DKA  Respiratory: normal effort BS clear to auscultation bilaterally no wheeze no rhonchi  Abdomen: soft +BS non-tender to palpation  Musculoskeletal: no clubbing cyanosis  Skin: excoriation on inner thighs, dressing right heel and right hand intact.    Data Reviewed: Basic Metabolic Panel:  Recent Labs Lab 05/31/13 1827 06/02/13 0613 06/03/13 0603 06/04/13 0557  NA 145 147 148* 147  K 3.5* 3.9 2.9* 3.1*  CL 98 101 104 105  CO2 30 29 31 27   GLUCOSE 109* 76 121* 135*  BUN 7 17 8 12   CREATININE 2.33* 3.49* 2.04* 2.74*  CALCIUM 10.7* 10.9* 9.3 9.7  MG  --   --  1.8  --   PHOS  --   --  3.1  --    Liver Function  Tests:  Recent Labs Lab 05/31/13 1831 06/03/13 0603  AST 20  --   ALT 5  --   ALKPHOS 190*  --   BILITOT 0.6  --   PROT 5.7*  --   ALBUMIN 1.6* 1.2*   No results found for this basename: LIPASE, AMYLASE,  in the last 168 hours  Recent Labs Lab 05/31/13 2140  AMMONIA 10*   CBC:  Recent Labs Lab 05/31/13 1827 06/02/13 0845 06/03/13 0602 06/04/13 0557  WBC 19.9* 16.7* 15.3* 17.1*  NEUTROABS 16.0*  --   --   --   HGB 12.8 13.1 11.0* 11.2*  HCT 38.4 40.1 33.8* 34.8*  MCV 93.4 92.8 93.4 93.8  PLT 295 247 270 266   Cardiac Enzymes:  Recent Labs Lab 05/31/13 1827 05/31/13 2040 06/01/13 0603 06/03/13 0602  CKTOTAL  --  36  --   --   CKMB  --  2.3  --   --   TROPONINI <0.30  --  0.48* 0.52*   BNP (last 3 results)  Recent Labs  03/02/13 1645  PROBNP 13840.0*   CBG:  Recent Labs Lab 06/03/13 1155 06/03/13 1642 06/03/13 2129 06/03/13 2317 06/04/13 0738  GLUCAP 120* 192* 232* 134* 126*    Recent Results (from the past 240 hour(s))  CULTURE, BLOOD (ROUTINE X 2)     Status: None   Collection Time    05/31/13  6:27 PM  Result Value Ref Range Status   Specimen Description BLOOD LEFT ANTECUBITAL   Final   Special Requests     Final   Value: BOTTLES DRAWN AEROBIC AND ANAEROBIC 6CC  IMMUNE:COMPROMISED   Culture NO GROWTH 3 DAYS   Final   Report Status PENDING   Incomplete  CULTURE, BLOOD (ROUTINE X 2)     Status: None   Collection Time    05/31/13  7:13 PM      Result Value Ref Range Status   Specimen Description BLOOD LEFT HAND   Final   Special Requests BOTTLES DRAWN AEROBIC ONLY 4CC  IMMUNE:COMPROMISED   Final   Culture  Setup Time     Final   Value: 06/03/2013 10:10     Performed at [facility]   Culture     Final   Value:        BLOOD CULTURE RECEIVED NO GROWTH TO DATE CULTURE WILL BE HELD FOR 5 DAYS BEFORE ISSUING A FINAL NEGATIVE REPORT     Note: Gram Stain Report Called to,Read Back By and Verified With: BULLINS MEGAN AT 4627 ON  06/03/13 BY Tyrone Schimke M Performed at [facility]     Performed at [facility]   Report Status PENDING   Incomplete  CLOSTRIDIUM DIFFICILE BY PCR     Status: None   Collection Time    06/02/13  2:30 PM      Result Value Ref Range Status   C difficile by pcr NEGATIVE  NEGATIVE Final     Studies: Mr Heel Right Wo Contrast  06/03/2013   CLINICAL DATA:  Diabetic patient with a skin ulcer over the right heel.  EXAM: MR OF THE RIGHT HEEL WITHOUT CONTRAST  TECHNIQUE: Multiplanar, multisequence MR imaging was performed. No intravenous contrast was administered.  COMPARISON:  None.  FINDINGS: A skin ulceration is seen over the lateral margin of the right heel posteriorly. There is marrow edema in the underlying calcaneus involving an area measuring approximately 2.1 cm AP by 1.5 cm transverse by 1.4 cm craniocaudal compatible with osteomyelitis. [name] wound appears to extend almost to cortical bone. No fluid collection is identified. Bone marrow signal is otherwise unremarkable.  All visualized tendons and ligaments appear intact. The sinus tarsus and plantar fascia are unremarkable. No osteochondral lesion of the talar dome is identified.  IMPRESSION: Skin ulceration over the lateral aspect of the right heel with underlying marrow signal abnormality in the calcaneus compatible with osteomyelitis. Negative for abscess.   Electronically Signed   By: Inge Rise M.D.   On: 06/03/2013 11:03    Scheduled Meds: . acidophilus  1 capsule Oral Daily  . atorvastatin  10 mg Oral q morning - 10a  . carbidopa-levodopa  1.5 tablet Oral TID  . clopidogrel  75 mg Oral Q breakfast  . collagenase   Topical Daily  . donepezil  10 mg Oral QHS  . feeding supplement (NEPRO CARB STEADY)  237 mL Oral Q24H  . insulin aspart  0-5 Units Subcutaneous QHS  . insulin aspart  0-9 Units Subcutaneous TID WC  . loperamide  2 mg Oral BID  . metoprolol  5 mg Intravenous 4 times per day  . metronidazole  250  mg Intravenous Q8H  . midodrine  10 mg Oral Q M,W,F-HD  . multivitamin  1 tablet Oral BID  . pantoprazole (PROTONIX) IV  40 mg Intravenous Q24H  . pentafluoroprop-tetrafluoroeth  1 application Topical Daily  . sodium chloride  3 mL Intravenous Q12H  . sodium chloride  3 mL Intravenous Q12H  . vancomycin  500 mg Intravenous Q M,W,F-HD   Continuous Infusions:   Principal Problem:   Osteomyelitis of right foot Active Problems:   Atrial fibrillation   Parkinson's disease   Hypertension   Dementia   Acute encephalopathy   Protein-calorie malnutrition, severe   Hypoglycemia   Leukocytosis, unspecified   Elevated troponin I level   Melena   ESRD on dialysis   Diarrhea    Time spent: 30 minutes    [facility] Pager [phone]. If 7PM-7AM, please contact night-coverage at [email or web address], password [facility] 06/04/2013, 10:18 AM  LOS: 4 days

## 2013-06-04 NOTE — Progress Notes (Signed)
Patient ID: Katrina Meyer, female   DOB: 09-10-1932, 80 y.o.   MRN: [id number]  [place] A. Katrina Laughter, MD     [email or web address]          Katrina Meyer is an 80 y.o. female.   Assessment/Plan: 1. Unexplained spell of unresponsiveness with spontaneous improvement. She appears to have residual mutism of unclear etiology. This could be due to the effect of old stroke with recurrent symptoms and/or seizures. The spell was associated with hypotension which may be The culprit. 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 An EEG will be obtained. Dementia labs will be obtained.  2. Hypotension of unclear etiology.  3. Old the infarcts seen on imaging both cortical and subcortical associated with petechial hemorrhage.  4. Cognitive impairment at baseline.  5. History of parkinsonism. The dose of Sinemet will be increased to see this helps with bradykinesia and speech impairment.  The daughters at the bedside. The daughter reports that patient seem to have some difficulties talking at baseline. The dosing to think that she talks when she wants to. She apparently has spoken this even to the granddaughter. Though tells me that the patient went to see a Dr. Nevada Meyer in the [place] area and was placed on Sinemet about 2 years ago for suspected parkinsonism.   GENERAL: Thin female in no acute distress.  HEENT: Supple. Atraumatic normocephalic.  ABDOMEN: soft  EXTREMITIES: No edema. Left above-the-knee amputation. Right hand in bandage with application of middle finger.  BACK: Normal.  SKIN: Normal by inspection.  MENTAL STATUS: She is awake and alert. She attempts to speak but is essentially mute. She does mouth a few words on naming of objects. She follows commands well.  CRANIAL NERVES: Pupils are equal, round and reactive to light and accommodation; extra ocular movements are full, there is no significant nystagmus; visual fields are full; upper  and lower facial muscles are normal in strength and symmetric, there is no flattening of the nasolabial folds; tongue is midline; uvula is midline; shoulder elevation is normal.  MOTOR: Right lower extremity is weak duue to pain 3/5. Other extremities are 5/5.  COORDINATION: Left finger to nose is normal, right finger to nose is normal, No rest tremor; no intention tremor; no postural tremor; no bradykinesia.  REFLEXES: Deep tendon reflexes are symmetrical and normal.  SENSATION: Unreliable.        Objective: Vital signs in last 24 hours: Temp:  [98 F (36.7 C)-98.5 F (36.9 C)] 98.1 F (36.7 C) (05/01 1345) Pulse Rate:  [62-120] 120 (05/01 1700) Resp:  [16-20] 16 (05/01 1345) BP: (77-169)/(47-90) 129/56 mmHg (05/01 1700) SpO2:  [94 %-100 %] 99 % (05/01 1345)  Intake/Output from previous day:   Intake/Output this shift: Total I/O In: 120 [P.O.:120] Out: -  Nutritional status: Full Liquid   Lab Results: Results for orders placed during the hospital encounter of 05/31/13 (from the past 48 hour(s))  GLUCOSE, CAPILLARY     Status: Abnormal   Collection Time    06/02/13  9:17 PM      Result Value Ref Range   Glucose-Capillary 113 (*) 70 - 99 mg/dL  TROPONIN I     Status: Abnormal   Collection Time    06/03/13  6:02 AM      Result Value Ref Range   Troponin I 0.52 (*) <0.30 ng/mL   Comment: CRITICAL RESULT CALLED TO, READ BACK BY AND VERIFIED WITH:  BULLIN,M AT 7:20AM ON 06/03/13 BY FESTERMAN,C                Due to the release kinetics of cTnI,     a negative result within the first hours     of the onset of symptoms does not rule out     myocardial infarction with certainty.     If myocardial infarction is still suspected,     repeat the test at appropriate intervals.  CBC     Status: Abnormal   Collection Time    06/03/13  6:02 AM      Result Value Ref Range   WBC 15.3 (*) 4.0 - 10.5 K/uL   RBC 3.62 (*) 3.87 - 5.11 MIL/uL   Hemoglobin 11.0 (*) 12.0 - 15.0  g/dL   HCT 33.8 (*) 36.0 - 46.0 %   MCV 93.4  78.0 - 100.0 fL   MCH 30.4  26.0 - 34.0 pg   MCHC 32.5  30.0 - 36.0 g/dL   RDW 17.0 (*) 11.5 - 15.5 %   Platelets 270  150 - 400 K/uL  RENAL FUNCTION PANEL     Status: Abnormal   Collection Time    06/03/13  6:03 AM      Result Value Ref Range   Sodium 148 (*) 137 - 147 mEq/L   Potassium 2.9 (*) 3.7 - 5.3 mEq/L   Comment: CRITICAL RESULT CALLED TO, READ BACK BY AND VERIFIED WITH:     BULLIN,M AT 7:30AM ON 06/03/13 BY FESTERMAN,C   Chloride 104  96 - 112 mEq/L   CO2 31  19 - 32 mEq/L   Glucose, Bld 121 (*) 70 - 99 mg/dL   BUN 8  6 - 23 mg/dL   Creatinine, Ser 2.04 (*) 0.50 - 1.10 mg/dL   Comment: DELTA CHECK NOTED   Calcium 9.3  8.4 - 10.5 mg/dL   Phosphorus 3.1  2.3 - 4.6 mg/dL   Albumin 1.2 (*) 3.5 - 5.2 g/dL   GFR calc non Af Amer 22 (*) >90 mL/min   GFR calc Af Amer 26 (*) >90 mL/min   Comment: (NOTE)     The eGFR has been calculated using the CKD EPI equation.     This calculation has not been validated in all clinical situations.     eGFR's persistently <90 mL/min signify possible Chronic Kidney     Disease.  MAGNESIUM     Status: None   Collection Time    06/03/13  6:03 AM      Result Value Ref Range   Magnesium 1.8  1.5 - 2.5 mg/dL  GLUCOSE, CAPILLARY     Status: Abnormal   Collection Time    06/03/13  7:17 AM      Result Value Ref Range   Glucose-Capillary 123 (*) 70 - 99 mg/dL   Comment 1 Notify RN     Comment 2 Documented in Chart    OCCULT BLOOD X 1 CARD TO LAB, STOOL     Status: Abnormal   Collection Time    06/03/13 10:00 AM      Result Value Ref Range   Fecal Occult Bld POSITIVE (*) NEGATIVE  GLUCOSE, CAPILLARY     Status: Abnormal   Collection Time    06/03/13 11:55 AM      Result Value Ref Range   Glucose-Capillary 120 (*) 70 - 99 mg/dL   Comment 1 Notify RN     Comment 2 Documented in Chart  GLUCOSE, CAPILLARY     Status: Abnormal   Collection Time    06/03/13  4:42 PM      Result Value Ref  Range   Glucose-Capillary 192 (*) 70 - 99 mg/dL   Comment 1 Notify RN     Comment 2 Documented in Chart    GLUCOSE, CAPILLARY     Status: Abnormal   Collection Time    06/03/13  9:29 PM      Result Value Ref Range   Glucose-Capillary 232 (*) 70 - 99 mg/dL  GLUCOSE, CAPILLARY     Status: Abnormal   Collection Time    06/03/13 11:17 PM      Result Value Ref Range   Glucose-Capillary 134 (*) 70 - 99 mg/dL   Comment 1 Notify RN     Comment 2 Documented in Chart    CBC     Status: Abnormal   Collection Time    06/04/13  5:57 AM      Result Value Ref Range   WBC 17.1 (*) 4.0 - 10.5 K/uL   RBC 3.71 (*) 3.87 - 5.11 MIL/uL   Hemoglobin 11.2 (*) 12.0 - 15.0 g/dL   HCT 34.8 (*) 36.0 - 46.0 %   MCV 93.8  78.0 - 100.0 fL   MCH 30.2  26.0 - 34.0 pg   MCHC 32.2  30.0 - 36.0 g/dL   RDW 17.3 (*) 11.5 - 15.5 %   Platelets 266  150 - 400 K/uL  BASIC METABOLIC PANEL     Status: Abnormal   Collection Time    06/04/13  5:57 AM      Result Value Ref Range   Sodium 147  137 - 147 mEq/L   Potassium 3.1 (*) 3.7 - 5.3 mEq/L   Chloride 105  96 - 112 mEq/L   CO2 27  19 - 32 mEq/L   Glucose, Bld 135 (*) 70 - 99 mg/dL   BUN 12  6 - 23 mg/dL   Creatinine, Ser 2.74 (*) 0.50 - 1.10 mg/dL   Calcium 9.7  8.4 - 10.5 mg/dL   GFR calc non Af Amer 16 (*) >90 mL/min   GFR calc Af Amer 18 (*) >90 mL/min   Comment: (NOTE)     The eGFR has been calculated using the CKD EPI equation.     This calculation has not been validated in all clinical situations.     eGFR's persistently <90 mL/min signify possible Chronic Kidney     Disease.  T4, FREE     Status: None   Collection Time    06/04/13  5:57 AM      Result Value Ref Range   Free T4 0.99  0.80 - 1.80 ng/dL   Comment: Performed at [place], CAPILLARY     Status: Abnormal   Collection Time    06/04/13  7:38 AM      Result Value Ref Range   Glucose-Capillary 126 (*) 70 - 99 mg/dL   Comment 1 Notify RN    GLUCOSE, CAPILLARY      Status: Abnormal   Collection Time    06/04/13 11:30 AM      Result Value Ref Range   Glucose-Capillary 102 (*) 70 - 99 mg/dL   Comment 1 Notify RN      Lipid Panel No results found for this basename: CHOL, TRIG, HDL, CHOLHDL, VLDL, LDLCALC,  in the last 72 hours  Studies/Results: Mr Heel Right Wo Contrast  06/03/2013   CLINICAL DATA:  Diabetic patient with a skin ulcer over the right heel.  EXAM: MR OF THE RIGHT HEEL WITHOUT CONTRAST  TECHNIQUE: Multiplanar, multisequence MR imaging was performed. No intravenous contrast was administered.  COMPARISON:  None.  FINDINGS: A skin ulceration is seen over the lateral margin of the right heel posteriorly. There is marrow edema in the underlying calcaneus involving an area measuring approximately 2.1 cm AP by 1.5 cm transverse by 1.4 cm craniocaudal compatible with osteomyelitis. [name] wound appears to extend almost to cortical bone. No fluid collection is identified. Bone marrow signal is otherwise unremarkable.  All visualized tendons and ligaments appear intact. The sinus tarsus and plantar fascia are unremarkable. No osteochondral lesion of the talar dome is identified.  IMPRESSION: Skin ulceration over the lateral aspect of the right heel with underlying marrow signal abnormality in the calcaneus compatible with osteomyelitis. Negative for abscess.   Electronically Signed   By: Inge Rise M.D.   On: 06/03/2013 11:03    Medications:  Scheduled Meds: . acidophilus  1 capsule Oral Daily  . atorvastatin  10 mg Oral q morning - 10a  . carbidopa-levodopa  1.5 tablet Oral TID  . ceFEPime (MAXIPIME) IV  2 g Intravenous Q M,W,F-HD  . clopidogrel  75 mg Oral Q breakfast  . collagenase   Topical Daily  . donepezil  10 mg Oral QHS  . feeding supplement (RESOURCE BREEZE)  1 Container Oral TID BM  . insulin aspart  0-5 Units Subcutaneous QHS  . insulin aspart  0-9 Units Subcutaneous TID WC  . loperamide  2 mg Oral BID  . metoprolol  5 mg  Intravenous 4 times per day  . metroNIDAZOLE  250 mg Oral 3 times per day  . midodrine  10 mg Oral Q M,W,F-HD  . multivitamin  1 tablet Oral BID  . pantoprazole (PROTONIX) IV  40 mg Intravenous Q24H  . pentafluoroprop-tetrafluoroeth  1 application Topical Daily  . sodium chloride  3 mL Intravenous Q12H  . sodium chloride  3 mL Intravenous Q12H  . vancomycin  500 mg Intravenous Q M,W,F-HD   Continuous Infusions:  PRN Meds:.sodium chloride, sodium chloride, sodium chloride, sodium chloride, sodium chloride, alteplase, feeding supplement (NEPRO CARB STEADY), heparin, heparin, lidocaine (PF), lidocaine-prilocaine, pentafluoroprop-tetrafluoroeth, RESOURCE THICKENUP CLEAR, sodium chloride     LOS: 4 days   Norah Fick A. Katrina Meyer, M.D.  Diplomate, Tax adviser of Psychiatry and Neurology ( Neurology).

## 2013-06-04 NOTE — Plan of Care (Signed)
Rectal tube removed per verbal orders from Dyanne Carrel, NP.  Tube was leaking - will check pt q1-2hr and PRN to change as needed.  Barrier cream also applied. Dressings intact

## 2013-06-04 NOTE — Procedures (Signed)
   HEMODIALYSIS TREATMENT NOTE:  3.5 hour low heparin dialysis completed via right upper arm AVG (15g ante/retrograde).  Goal NOT met:  BP unable to tolerate ANY ultrafiltration.  SBPs dropped to 80s and 70s and it is difficult to ascertain how low a blood pressure this patient can tolerate.  +557cc NS given to support BP.  All blood was reinfused.  Hemostasis achieved within 7 minutes after arterial needle removal.  Prolonged bleeding from venous needle site; hemostasis achieved after 30 minutes.  Palpable thrill after sites were dressed with surgifoam/gauze dressing.  Pt received Vancomycin and Maxipime at end of dialysis.  KCl infusion was held prior to starting treatment; to be resumed by primary RN--Matthew Wright, RN aware.   , RN, CDN 

## 2013-06-04 NOTE — Progress Notes (Signed)
[place] for Vancomycin and Cefepime  Indication: osteomyelitis of right heel  Allergies  Allergen Reactions  . Hydralazine Rash  . Hydrochlorothiazide Hives and Rash   Patient Measurements: Height: 5' 6.93" (170 cm) Weight: 125 lb (56.7 kg) IBW/kg (Calculated) : 61.44  Vital Signs: Temp: 98 F (36.7 C) (05/01 0542) Temp src: Oral (05/01 0542) BP: 104/60 mmHg (05/01 0542) Pulse Rate: 109 (05/01 0542) Intake/Output from previous day:   Intake/Output from this shift: Total I/O In: 120 [P.O.:120] Out: -   Labs:  Recent Labs  06/02/13 0613 06/02/13 0845 06/03/13 0602 06/03/13 0603 06/04/13 0557  WBC  --  16.7* 15.3*  --  17.1*  HGB  --  13.1 11.0*  --  11.2*  PLT  --  247 270  --  266  CREATININE 3.49*  --   --  2.04* 2.74*   Estimated Creatinine Clearance: 15.1 ml/min (by C-G formula based on Cr of 2.74). No results found for this basename: VANCOTROUGH, VANCOPEAK, VANCORANDOM, [place], [place], [place], [place], TOBRAPEAK, TOBRARND, AMIKACINPEAK, AMIKACINTROU, AMIKACIN,  in the last 72 hours   Microbiology: Recent Results (from the past 720 hour(s))  CULTURE, BLOOD (ROUTINE X 2)     Status: None   Collection Time    05/31/13  6:27 PM      Result Value Ref Range Status   Specimen Description BLOOD LEFT ANTECUBITAL   Final   Special Requests     Final   Value: BOTTLES DRAWN AEROBIC AND ANAEROBIC 6CC  IMMUNE:COMPROMISED   Culture NO GROWTH 3 DAYS   Final   Report Status PENDING   Incomplete  CULTURE, BLOOD (ROUTINE X 2)     Status: None   Collection Time    05/31/13  7:13 PM      Result Value Ref Range Status   Specimen Description BLOOD LEFT HAND   Final   Special Requests BOTTLES DRAWN AEROBIC ONLY 4CC  IMMUNE:COMPROMISED   Final   Culture  Setup Time     Final   Value: 06/03/2013 10:10     Performed at [facility]   Culture     Final   Value:        BLOOD CULTURE RECEIVED NO GROWTH TO DATE CULTURE WILL  BE HELD FOR 5 DAYS BEFORE ISSUING A FINAL NEGATIVE REPORT     Note: Gram Stain Report Called to,Read Back By and Verified With: BULLINS MEGAN AT 8756 ON 06/03/13 BY Tyrone Schimke M Performed at [facility]     Performed at [facility]   Report Status PENDING   Incomplete  CLOSTRIDIUM DIFFICILE BY PCR     Status: None   Collection Time    06/02/13  2:30 PM      Result Value Ref Range Status   C difficile by pcr NEGATIVE  NEGATIVE Final   Medical History: Past Medical History  Diagnosis Date  . Hyperlipidemia     takes Lipitor daily  . DVT (deep venous thrombosis)   . Stroke   . Parkinson's disease     takes Sinemet daily  . Tibia/fibula fracture 01/04/2012    Treated with long leg casting. Cast change in hospital 02/27/12   . Peripheral vascular disease 02/27/2012    Skin ulcer of right great toe  . Atrial fibrillation 03/09/2012    Seen on older EKG as well but family not aware.   . Macrocytic anemia 03/09/2012  . Lower GI bleed 03/10/2012  hematochezia secondary to ulcerated polyps; diverticulosis; chronic diarrhea  . End stage renal disease on dialysis     [facility]- MWF  . Hypertension     takes Coreg daily  . Dementia     takes Aricept daily  . Diabetes mellitus, type II     takes Lantus and Novolog  . Seasonal allergies     takes Allegra daily  . Depression     takes Prozac daily  . Hypothyroidism     takes Synthroid daily  . Osteomyelitis of right foot 06/03/2013   Medications:  Scheduled:  . acidophilus  1 capsule Oral Daily  . atorvastatin  10 mg Oral q morning - 10a  . carbidopa-levodopa  1.5 tablet Oral TID  . clopidogrel  75 mg Oral Q breakfast  . collagenase   Topical Daily  . donepezil  10 mg Oral QHS  . feeding supplement (NEPRO CARB STEADY)  237 mL Oral Q24H  . insulin aspart  0-5 Units Subcutaneous QHS  . insulin aspart  0-9 Units Subcutaneous TID WC  . loperamide  2 mg Oral BID  . metoprolol  5 mg Intravenous 4 times per day  .  metroNIDAZOLE  250 mg Oral 3 times per day  . midodrine  10 mg Oral Q M,W,F-HD  . multivitamin  1 tablet Oral BID  . pantoprazole (PROTONIX) IV  40 mg Intravenous Q24H  . pentafluoroprop-tetrafluoroeth  1 application Topical Daily  . sodium chloride  3 mL Intravenous Q12H  . sodium chloride  3 mL Intravenous Q12H  . vancomycin  500 mg Intravenous Q M,W,F-HD   Assessment: 80 yo F with ESRD (HD on MWF).  She was admitted with fever of unknown origin.  WBC remains elevated, but she has been afebrile.  MRI + osteomyelitis of right heel.  1/2 Blood cx +Staph (sens pending). Antibiotics being changed per ID recommendations for easier outpatient administration.  Zosyn 4/27 >>5/1 Vancomycin 4/27 >> Cefepime 5/1>> Flagyl 5/1>>  Goal of Therapy:  Pre-Hemodialysis Vancomycin level goal range =15-25 mcg/ml. Eradicate infection.   Plan:  Vancomycin 500mg  IV after each HD Cefepime  2gm IV qHD Check pre-HD Vancomycin level with HD on Monday F/U cx data, patient progress  Katrina Meyer 06/04/2013,10:36 AM

## 2013-06-04 NOTE — Progress Notes (Signed)
The patient was seen and evaluated. Her chart, laboratory studies, and vital signs were reviewed. She was discussed with nurse practitioner, Ms. Renard Hamper. Agree with her assessment and plan. Will attempt to review records obtained from [facility] in [place] more thoroughly. We'll examine her wounds without dressings more thoroughly tomorrow.  Her daughter Katrina Meyer states that Nepro supplement tends to cause loose stools. Therefore, it will be discontinued and Resource-Breeze liquid supplement will be started.

## 2013-06-04 NOTE — Progress Notes (Signed)
Dressing changes done to sacrum, right hand, and right foot. Tolerated well.

## 2013-06-05 ENCOUNTER — Encounter (HOSPITAL_COMMUNITY): Payer: Self-pay | Admitting: Internal Medicine

## 2013-06-05 DIAGNOSIS — Z8673 Personal history of transient ischemic attack (TIA), and cerebral infarction without residual deficits: Secondary | ICD-10-CM

## 2013-06-05 DIAGNOSIS — I96 Gangrene, not elsewhere classified: Secondary | ICD-10-CM

## 2013-06-05 LAB — CULTURE, BLOOD (ROUTINE X 2): Culture: NO GROWTH

## 2013-06-05 LAB — CBC
HCT: 34.4 % — ABNORMAL LOW (ref 36.0–46.0)
Hemoglobin: 11.3 g/dL — ABNORMAL LOW (ref 12.0–15.0)
MCH: 30.5 pg (ref 26.0–34.0)
MCHC: 32.8 g/dL (ref 30.0–36.0)
MCV: 93 fL (ref 78.0–100.0)
PLATELETS: 253 10*3/uL (ref 150–400)
RBC: 3.7 MIL/uL — ABNORMAL LOW (ref 3.87–5.11)
RDW: 17.2 % — AB (ref 11.5–15.5)
WBC: 14.8 10*3/uL — ABNORMAL HIGH (ref 4.0–10.5)

## 2013-06-05 LAB — PHOSPHORUS: Phosphorus: 2 mg/dL — ABNORMAL LOW (ref 2.3–4.6)

## 2013-06-05 LAB — BASIC METABOLIC PANEL
BUN: 6 mg/dL (ref 6–23)
CALCIUM: 8.8 mg/dL (ref 8.4–10.5)
CO2: 31 mEq/L (ref 19–32)
CREATININE: 1.66 mg/dL — AB (ref 0.50–1.10)
Chloride: 105 mEq/L (ref 96–112)
GFR calc non Af Amer: 28 mL/min — ABNORMAL LOW (ref >90)
GFR, EST AFRICAN AMERICAN: 33 mL/min — AB (ref >90)
Glucose, Bld: 130 mg/dL — ABNORMAL HIGH (ref 70–99)
Potassium: 3.6 mEq/L — ABNORMAL LOW (ref 3.7–5.3)
Sodium: 145 mEq/L (ref 137–147)

## 2013-06-05 LAB — GLUCOSE, CAPILLARY
GLUCOSE-CAPILLARY: 120 mg/dL — AB (ref 70–99)
GLUCOSE-CAPILLARY: 208 mg/dL — AB (ref 70–99)
Glucose-Capillary: 143 mg/dL — ABNORMAL HIGH (ref 70–99)

## 2013-06-05 LAB — ANTISTREPTOLYSIN O TITER: ASO: <25 IU/mL (ref <409)

## 2013-06-05 MED ORDER — KCL IN DEXTROSE-NACL 20-5-0.9 MEQ/L-%-% IV SOLN
INTRAVENOUS | Status: DC
  Administered 2013-06-05 – 2013-06-07 (×4): via INTRAVENOUS
  Filled 2013-06-05 (×4): qty 1000

## 2013-06-05 MED ORDER — BOOST / RESOURCE BREEZE PO LIQD
1.0000 | Freq: Two times a day (BID) | ORAL | Status: DC
  Administered 2013-06-08 – 2013-06-17 (×11): 1 via ORAL
  Filled 2013-06-05: qty 1

## 2013-06-05 MED ORDER — CARVEDILOL 3.125 MG PO TABS
3.1250 mg | ORAL_TABLET | Freq: Two times a day (BID) | ORAL | Status: DC
  Administered 2013-06-06 – 2013-06-07 (×3): 3.125 mg via ORAL
  Filled 2013-06-05 (×6): qty 1

## 2013-06-05 MED ORDER — LEVOTHYROXINE SODIUM 125 MCG PO TABS
125.0000 ug | ORAL_TABLET | Freq: Every day | ORAL | Status: DC
  Administered 2013-06-05 – 2013-06-06 (×2): 125 ug via ORAL
  Filled 2013-06-05 (×6): qty 1

## 2013-06-05 MED ORDER — NEPRO/CARBSTEADY PO LIQD: 237.0000 mL | Freq: Three times a day (TID) | ORAL | Status: DC

## 2013-06-05 NOTE — Plan of Care (Signed)
All dressings removed and seen by physician.  Clean dressings reapplied to right hand, foot and coccyx area.  Santyl ointment applied.

## 2013-06-05 NOTE — Progress Notes (Signed)
[facility] PROGRESS NOTE  RIDLEY SPIRA [id number] DOB: 1932/03/23 DOA: 05/31/2013 PCP: Glenda Chroman., MD  Assessment/Plan: Acute encephalopathy. Dr. [name] assessment noted. Dr. [name] opined that the patient's cognitive deficit or dementia and likely progressive Parkinson's disease are likely contributing to her mutism and intermittent unresponsiveness. Given her history of previous strokes, multi-infarct or vascular dementia is likely. Per [name] (her daughter), the patient has long-standing dementia and Parkinson's disease. At baseline, the patient talks intermittently and often she does not talk at all. This is person dependent. EEG revealed moderate generalized slowing, but no epileptiform activity. Her mental status is likely back to baseline. Parkinson's disease. Dr. [name] increased the dose of Sinemet to see if it helps with bradykinesia and speech impairment. History of strokes/vascular dementia. No new strokes per MRI brain. Stool guaiac positive, but she is on a PPI.  Plavix resumed but with caution due to her history of GI bleeding. Ammonia level within normal limits. Vitamin B12 level elevated, not deficient. RPR nonreactive. HIV nonreactive.  Right heel osteomyelitis. This was confirmed per MRI. Patient on antibiotics from 05/13/13-05/30/13 at [facility] and discharged on oral Levaquin. She was started on vancomycin and Zosyn on admission. Discussed case with Dr. Megan Salon of ID who recommended that given length of antibiotics and no culture data back on right heel in setting of profuse diarrhea that vancomycin, flagy and cefipime could be given. Recommended flagyl to be po if possible. Will adjust antibiotics accordingly. Culture results right heel pending. Dr. Megan Salon indicated then length of treatment will depend on culture and sensitivity data. May need to "restart the clock" if growing something resistant.   She could potentially receive vancomycin outpatient for 3-6  weeks at the dialysis center.   <<<PAD with critical limb ischemia right upper extremity/right lower extremity. The patient was hospitalized at [facility] from 05/13/2013 through 05/29/2013. During that time, she underwent: -Status post I&D of deep abscess and third right ray amputation (05/18/13) status post repeat I&D and wound closure of right hand by Dr. Stann Mainland, 05/24/13. Continued wound care of areas of skin necrosis. -Status post proximalization/revision of AVG, ligation of distal AVG, endarterectomy right brachial artery 05/17/13 by Dr. Maudie Mercury. -Status post PTA right ulnar 90% to 20% residual with steal phenomena and threatened hand. Wound cultures grew Serratia. -Status post right lower extremity arteriogram 05/25/13, status post mechanical thrombectomy; PTA/stent 80% AT artery to 30% et Ronney Asters. Placement of lytic catheter during procedure with administration of lytics overnight to improve flow. DES was placed at the proximal edge of the AT stent. -Followup with vascular surgery 06/09/13 at 10:15 a.m. with Dr. Kim.>>>   Multiple pressure/ischemic ulcerations- wounds. Wound care consultation and recommendations noted. Status post sharp debridement, Santyl ointment, dressing changes, air mattress, prolonged boot, etc. Obtain cultures from right heel. She will followup with vascular surgeon Dr. Maudie Mercury as previously scheduled. Loose stools/diarrhea which are greenish black in color. The extent of stools is slowing. The rectal tube had been discontinued as the nursing staff felt that the tube was not draining properly. Per her daughter, [name], the patient has a history of antibiotic-induced diarrhea. C. difficile PCR was negative.  Continue  Imodium twice a day. Continue Flagyl. Continue flora Q. Discontinue Nepro as Neoma Laming believes it causes more loose stools. Resource beverage given. Elevated troponin I. This may be related to tachycardia with rapid atrial fibrillation and/or underlying decreased clearance in the  setting of end-stage renal disease. 2-D echocardiogram revealed no left ventricular wall motion abnormalities and the  patient had preserved LV function. Given her chronic debilitation and dementia, aggressive diagnostic workup deferred. Continue Plavix and statin. Discontinue IV metoprolol and restart carvedilol at a lower dose with parameters. She is eating better.  Atrial fibrillation with rapid ventricular response. Her heart rate range 94-109. Discontinue IV metoprolol and restart carvedilol at a slightly lower dose. Plavix resumed but with caution. We'll monitor for GI bleeding.  Query dysphagia versus poor oral intake due to to dementia status Parkinson's disease. Speech therapy consultation/evaluation pending. Patient will take full liquids depending on who presents it.  The nursing staff was given specific instructions to not feed the patient unless she was totally alert and able to follow directions. Also encourage assistance of family as patient tends to take more from them. Hypotension following hemodialysis. This is likely chronic. Midodrine was added on hemodialysis days per nephrology.  Hypothyroidism. [name] TSH is slightly low, but free T4 is within normal limits at 0.99. Will restart Synthroid after it was held for several days.   Hypokalemia. This is associated with losses from diarrhea. Serum potassium data status post oral repletion and dialysate changes. ESRD: MWF dialysis. Appreciate nephrology assistance. Dialysis today Diabetes: CBG range 126-134. Continue SSI. HgA1c 6.0  Severe protein calorie malnutrition/anorexia. At baseline, the patient appears the little. However, according to family, she is eating better. We'll advance her diet to dysphagia 2 diet. We'll still appreciate speech therapy evaluation. [name] daughter, [name] requests that the Nepro be stopped as it exacerbates her diarrhea. Therefore, we'll discontinue it and restart Resource supplement.    Code  Status: full Family Communication: Discussed with daughter and granddaughter. Disposition Plan: Discharge with clinically appropriate.   Consultants: Nephrology  neurology   Procedures: 2 d-echo:  Vigorous systolic function with obliteration of the LV cavity, EF > 70%. The cavity size was mildly reduced. Wall thickness was increased in a pattern of moderate LVH. Wall motion was normal; there were no regional wall motion abnormalities. The study was not technically sufficient to allow evaluation of LV diastolic dysfunction due to atrial fibrillation    Antibiotics: Vancomycin 05/31/13>.  Zosyn 05/31/13>>06/04/13 Flagyl 06/04/13>>> cefipime 06/04/13>>>  HPI/Subjective: The patient is much more alert. She still does not speak much at all. She does give good eye contact. Her family is in the room. Questions are answered.    Objective: Filed Vitals:   06/05/13 1211  BP: 99/62  Pulse:   Temp:   Resp:    temperature 98.1. Pulse 114. Respiratory rate 12. Blood pressure 99/62.   Intake/Output Summary (Last 24 hours) at 06/05/13 1638 Last data filed at 06/05/13 0926  Gross per 24 hour  Intake    120 ml  Output   -557 ml  Net    677 ml   Filed Weights   06/02/13 0535 06/02/13 1045 06/03/13 0619  Weight: 55.4 kg (122 lb 2.2 oz) 53.7 kg (118 lb 6.2 oz) 56.7 kg (125 lb)    Exam:   General: alert 80 year old African-American woman sitting up in bed, in no acute distress.   Cardiovascular: irregular, irregular, with a 2/6 systolic murmur and mild tachycardia. No left upper edema. No right lower extremity edema.   Respiratory: clear anteriorly with decreased breath sounds in the bases.   Abdomen: soft, Positive bowel sounds, soft, nontender, nondistended.   Musculoskeletal/extremities/skin : no acute hot red joints. Left stump intact. Bandage is taken off of the sacrum. Noted stage 4-5 cm unstageable pressure ulcers on the sacrum without purulent drainage and  with clean base.  Right hand with sutures in place dorsal surface; necrotic appearing eschar with a small amount of. When drainage between the third and fourth digit. Right foot with large area of dry gangrene of the heel and alongside the lateral aspect of the foot. Scant amount of non-malodorous drainage. Right foot toes and right fingers on the hand cool to touch.  Neurologic: The patient is alert and she gives good eye contact, but she is mostly mute. No obvious facial droop.    Data Reviewed: Basic Metabolic Panel:  Recent Labs Lab 05/31/13 1827 06/02/13 9937 06/03/13 0603 06/04/13 0557 06/05/13 0644  NA 145 147 148* 147 145  K 3.5* 3.9 2.9* 3.1* 3.6*  CL 98 101 104 105 105  CO2 30 29 31 27 31   GLUCOSE 109* 76 121* 135* 130*  BUN 7 17 8 12 6   CREATININE 2.33* 3.49* 2.04* 2.74* 1.66*  CALCIUM 10.7* 10.9* 9.3 9.7 8.8  MG  --   --  1.8  --   --   PHOS  --   --  3.1  --  2.0*   Liver Function Tests:  Recent Labs Lab 05/31/13 1831 06/03/13 0603  AST 20  --   ALT 5  --   ALKPHOS 190*  --   BILITOT 0.6  --   PROT 5.7*  --   ALBUMIN 1.6* 1.2*   No results found for this basename: LIPASE, AMYLASE,  in the last 168 hours  Recent Labs Lab 05/31/13 2140  AMMONIA 10*   CBC:  Recent Labs Lab 05/31/13 1827 06/02/13 0845 06/03/13 0602 06/04/13 0557 06/05/13 0644  WBC 19.9* 16.7* 15.3* 17.1* 14.8*  NEUTROABS 16.0*  --   --   --   --   HGB 12.8 13.1 11.0* 11.2* 11.3*  HCT 38.4 40.1 33.8* 34.8* 34.4*  MCV 93.4 92.8 93.4 93.8 93.0  PLT 295 247 270 266 253   Cardiac Enzymes:  Recent Labs Lab 05/31/13 1827 05/31/13 2040 06/01/13 0603 06/03/13 0602  CKTOTAL  --  36  --   --   CKMB  --  2.3  --   --   TROPONINI <0.30  --  0.48* 0.52*   BNP (last 3 results)  Recent Labs  03/02/13 1645  PROBNP 13840.0*   CBG:  Recent Labs Lab 06/04/13 1843 06/04/13 2046 06/05/13 0801 06/05/13 1119 06/05/13 1627  GLUCAP 78 156* 120* 208* 143*    Recent Results (from the past 240  hour(s))  CULTURE, BLOOD (ROUTINE X 2)     Status: None   Collection Time    05/31/13  6:27 PM      Result Value Ref Range Status   Specimen Description BLOOD LEFT ANTECUBITAL   Final   Special Requests     Final   Value: BOTTLES DRAWN AEROBIC AND ANAEROBIC 6CC  IMMUNE:COMPROMISED   Culture NO GROWTH 5 DAYS   Final   Report Status 06/05/2013 FINAL   Final  CULTURE, BLOOD (ROUTINE X 2)     Status: None   Collection Time    05/31/13  7:13 PM      Result Value Ref Range Status   Specimen Description BLOOD LEFT HAND   Final   Special Requests BOTTLES DRAWN AEROBIC ONLY 4CC  IMMUNE:COMPROMISED   Final   Culture  Setup Time     Final   Value: 06/03/2013 10:10     Performed at [facility]     Final  Value: STAPHYLOCOCCUS SPECIES     Note: Gram Stain Report Called to,Read Back By and Verified With: BULLINS MEGAN AT 8242 ON 06/03/13 BY Tyrone Schimke M Performed at [facility]     Performed at [facility]   Report Status PENDING   Incomplete  CLOSTRIDIUM DIFFICILE BY PCR     Status: None   Collection Time    06/02/13  2:30 PM      Result Value Ref Range Status   C difficile by pcr NEGATIVE  NEGATIVE Final     Studies: No results found.  Scheduled Meds: . acidophilus  1 capsule Oral Daily  . atorvastatin  10 mg Oral q morning - 10a  . carbidopa-levodopa  2 tablet Oral TID  . ceFEPime (MAXIPIME) IV  2 g Intravenous Q M,W,F-HD  . clopidogrel  75 mg Oral Q breakfast  . collagenase   Topical Daily  . donepezil  10 mg Oral QHS  . feeding supplement (NEPRO CARB STEADY)  237 mL Oral TID WC  . insulin aspart  0-5 Units Subcutaneous QHS  . insulin aspart  0-9 Units Subcutaneous TID WC  . loperamide  2 mg Oral BID  . metoprolol  5 mg Intravenous 4 times per day  . metroNIDAZOLE  250 mg Oral 3 times per day  . midodrine  10 mg Oral Q M,W,F-HD  . multivitamin  1 tablet Oral BID  . pantoprazole (PROTONIX) IV  40 mg Intravenous Q24H  .  pentafluoroprop-tetrafluoroeth  1 application Topical Daily  . sodium chloride  3 mL Intravenous Q12H  . sodium chloride  3 mL Intravenous Q12H  . vancomycin  500 mg Intravenous Q M,W,F-HD   Continuous Infusions: . dextrose 5 % and 0.9 % NaCl with KCl 20 mEq/L 50 mL/hr at 06/05/13 0809    Principal Problem:   Osteomyelitis of right foot Active Problems:   Atrial fibrillation   Parkinson's disease   Peripheral vascular disease   Hypertension   Dementia   Acute encephalopathy   Protein-calorie malnutrition, severe   Hypoglycemia   Leukocytosis, unspecified   Elevated troponin I level   Melena   ESRD on dialysis   Diarrhea   History of stroke    Time spent: 45 minutes (including review of medical records from [place], discussion with family, etc.).     Rexene Alberts  [facility] Pager [phone]. If 7PM-7AM, please contact night-coverage at [email or web address], password [facility] 06/05/2013, 4:38 PM  LOS: 5 days

## 2013-06-05 NOTE — Progress Notes (Signed)
Subjective: Interval History: none.  Objective: Vital signs in last 24 hours: Temp:  [97.6 F (36.4 C)-98.3 F (36.8 C)] 98.1 F (36.7 C) (05/02 0604) Pulse Rate:  [62-123] 114 (05/02 0604) Resp:  [12-16] 12 (05/02 0604) BP: (77-169)/(47-90) 94/53 mmHg (05/02 0713) SpO2:  [94 %-99 %] 94 % (05/02 0604) Weight change:   Intake/Output from previous day: 05/01 0701 - 05/02 0700 In: 120 [P.O.:120] Out: -557  Intake/Output this shift:    Generally patient is alert today today and started eating some porridge and drinking soup Chest decreased breath sound otherwise clear Heart examination regular rate and rhythm no murmur Abdomen soft positive bowel sound Extremities no edema  Lab Results:  Recent Labs  06/04/13 0557 06/05/13 0644  WBC 17.1* 14.8*  HGB 11.2* 11.3*  HCT 34.8* 34.4*  PLT 266 253   BMET:   Recent Labs  06/04/13 0557 06/05/13 0644  NA 147 145  K 3.1* 3.6*  CL 105 105  CO2 27 31  GLUCOSE 135* 130*  BUN 12 6  CREATININE 2.74* 1.66*  CALCIUM 9.7 8.8   No results found for this basename: PTH,  in the last 72 hours Iron Studies: No results found for this basename: IRON, TIBC, TRANSFERRIN, FERRITIN,  in the last 72 hours  Studies/Results: Mr Heel Right Wo Contrast  06/03/2013   CLINICAL DATA:  Diabetic patient with a skin ulcer over the right heel.  EXAM: MR OF THE RIGHT HEEL WITHOUT CONTRAST  TECHNIQUE: Multiplanar, multisequence MR imaging was performed. No intravenous contrast was administered.  COMPARISON:  None.  FINDINGS: A skin ulceration is seen over the lateral margin of the right heel posteriorly. There is marrow edema in the underlying calcaneus involving an area measuring approximately 2.1 cm AP by 1.5 cm transverse by 1.4 cm craniocaudal compatible with osteomyelitis. [name] wound appears to extend almost to cortical bone. No fluid collection is identified. Bone marrow signal is otherwise unremarkable.  All visualized tendons and  ligaments appear intact. The sinus tarsus and plantar fascia are unremarkable. No osteochondral lesion of the talar dome is identified.  IMPRESSION: Skin ulceration over the lateral aspect of the right heel with underlying marrow signal abnormality in the calcaneus compatible with osteomyelitis. Negative for abscess.   Electronically Signed   By: Inge Rise M.D.   On: 06/03/2013 11:03    I have reviewed the patient's current medications.  Assessment/Plan: Problem #1 end-stage renal disease: She status post hemodialysis  yesterday. Problem #2 hypokalemia: Patient dialyzed using 4k bath and her potassium is normal Problem #3 after fibrillation: Her heart is controlled Problem #4 dementia : Patient does not committed very well. Problem #5 access disease Problem #6 hypotension her blood pressure is better today Problem #7 anemia: Her hemoglobin and hematocrit is with our target goal. Problem #8 metabolic bone disease: Calcium  is in  Range but her phosphorus is declining possibly from poor nutrition. Problem #9 poor nutrition. Patient po intake is very limited and albumin is very low. Problem #10 possible sepsis: Patient on antibiotics and her white blood cell count is improving and she is afebrile Plan: We'll make arrangements for patient to get dialysis today We'll start on Nepro I can po tid We'll check her basic metabolic panel and phosphorus  in the morning.   LOS: 5 days   Katrina Meyer 06/05/2013,9:22 AM   Oh.

## 2013-06-06 LAB — RENAL FUNCTION PANEL
Albumin: 1.1 g/dL — ABNORMAL LOW (ref 3.5–5.2)
BUN: 9 mg/dL (ref 6–23)
CO2: 26 mEq/L (ref 19–32)
CREATININE: 2.31 mg/dL — AB (ref 0.50–1.10)
Calcium: 9.3 mg/dL (ref 8.4–10.5)
Chloride: 107 mEq/L (ref 96–112)
GFR calc Af Amer: 22 mL/min — ABNORMAL LOW (ref >90)
GFR calc non Af Amer: 19 mL/min — ABNORMAL LOW (ref >90)
GLUCOSE: 154 mg/dL — AB (ref 70–99)
PHOSPHORUS: 2.4 mg/dL (ref 2.3–4.6)
POTASSIUM: 3.6 meq/L — AB (ref 3.7–5.3)
Sodium: 145 mEq/L (ref 137–147)

## 2013-06-06 LAB — CBC
HCT: 32.6 % — ABNORMAL LOW (ref 36.0–46.0)
Hemoglobin: 10.7 g/dL — ABNORMAL LOW (ref 12.0–15.0)
MCH: 30.7 pg (ref 26.0–34.0)
MCHC: 32.8 g/dL (ref 30.0–36.0)
MCV: 93.4 fL (ref 78.0–100.0)
PLATELETS: 237 10*3/uL (ref 150–400)
RBC: 3.49 MIL/uL — ABNORMAL LOW (ref 3.87–5.11)
RDW: 17.2 % — ABNORMAL HIGH (ref 11.5–15.5)
WBC: 13.6 10*3/uL — AB (ref 4.0–10.5)

## 2013-06-06 LAB — GLUCOSE, CAPILLARY
Glucose-Capillary: 150 mg/dL — ABNORMAL HIGH (ref 70–99)
Glucose-Capillary: 133 mg/dL — ABNORMAL HIGH (ref 70–99)

## 2013-06-06 LAB — MRSA PCR SCREENING: MRSA BY PCR: NEGATIVE

## 2013-06-06 MED ORDER — DILTIAZEM HCL 25 MG/5ML IV SOLN
5.0000 mg | INTRAVENOUS | Status: AC
  Administered 2013-06-06: 5 mg via INTRAVENOUS
  Filled 2013-06-06: qty 5

## 2013-06-06 MED ORDER — METOPROLOL TARTRATE 1 MG/ML IV SOLN
5.0000 mg | INTRAVENOUS | Status: DC | PRN
  Administered 2013-06-09: 5 mg via INTRAVENOUS

## 2013-06-06 NOTE — Progress Notes (Signed)
Subjective: Interval History: none.  Objective: Vital signs in last 24 hours: Temp:  [98.1 F (36.7 C)-98.9 F (37.2 C)] 98.9 F (37.2 C) (05/03 0732) Pulse Rate:  [127-129] 127 (05/03 0732) Resp:  [18] 18 (05/03 0732) BP: (83-110)/(51-64) 110/60 mmHg (05/03 0732) SpO2:  [98 %-100 %] 98 % (05/03 0732) Weight change:   Intake/Output from previous day: 05/02 0701 - 05/03 0700 In: 170 [P.O.:170] Out: -  Intake/Output this shift:    Generally patient is alert but none communicative Chest decreased breath sound otherwise clear Heart examination regular rate and rhythm no murmur Abdomen soft positive bowel sound Extremities no edema  Lab Results:  Recent Labs  06/05/13 0644 06/06/13 0619  WBC 14.8* 13.6*  HGB 11.3* 10.7*  HCT 34.4* 32.6*  PLT 253 237   BMET:   Recent Labs  06/05/13 0644 06/06/13 0619  NA 145 145  K 3.6* 3.6*  CL 105 107  CO2 31 26  GLUCOSE 130* 154*  BUN 6 9  CREATININE 1.66* 2.31*  CALCIUM 8.8 9.3   No results found for this basename: PTH,  in the last 72 hours Iron Studies: No results found for this basename: IRON, TIBC, TRANSFERRIN, FERRITIN,  in the last 72 hours  Studies/Results: No results found.  I have reviewed the patient's current medications.  Assessment/Plan: Problem #1 end-stage renal disease: She status post hemodialysis  The day before yesterday.Her potassium is normal. Her upper arm fistula does not have any bruit or thrill. Problem #2 hypokalemia: Patient dialyzed using 4k bath and her potassium is normal Problem #3 after fibrillation: Her heart is controlled Problem #4 dementia : Patient does not committed very well. Problem #5 access disease Problem #6 hypotension her blood pressure remains low Problem #7 anemia: Her hemoglobin and hematocrit is declining but still with in range. Problem #8 metabolic bone disease: Calcium  is in  Range but her phosphorus is declining possibly from poor nutrition. Problem #9 poor  nutrition. Patient po intake is very limited and albumin is very low. Problem #10 possible sepsis: Patient on antibiotics and her white blood cell count is improving and she is afebrile Problem#11 Clotted fistula:  Plan: Patient may need to be transferred to [facility] for possible declotting or tunnel catheter placement. Patient does not dialysis today her regular schedule is tomorrow We'll check her basic metabolic panel and phosphorus  in the morning.   LOS: 6 days   Pax Reasoner S Juliany Daughety 06/06/2013,8:44 AM   Oh.

## 2013-06-06 NOTE — Progress Notes (Addendum)
[facility] PROGRESS NOTE  Katrina Meyer [id number] DOB: May 26, 1932 DOA: 05/31/2013 PCP: Katrina Chroman., MD  Brief summary   The patient is a 80 year old woman with advanced dementia, likely vascular dementia, advanced Parkinson's disease, and severe peripheral vascular disease. She was recently hospitalized at [facility] in [place] from 05/13/13 through 05/29/13 for management of peripheral arterial disease with critical limb ischemia in the right upper extremity/right lower extremity. During that hospitalization, the patient had a number of vascularization procedures (see note below). She has dry gangrene of her right foot. She had incision and drainage of abscess on her right hand and/or right foot  She was treated with vancomycin and Zosyn during most of the hospitalization. Her hand wound culture grew out Serratia and she was discharged on oral Levaquin. She presented to [facility] on 05/31/2013 for altered mental status associated with hemodialysis related hypotension. Sepsis was not assumed, but she was started on vancomycin and Zosyn. Studies were ordered and revealed osteomyelitis of the right heel. 2-D echocardiogram revealed preserved left ventricular function. Wound culture of the right foot is pending, but reveals abundant gram-negative rods and moderate gram-positive cocci. One out of 2 blood cultures was positive for Staphylococcus species. Antibiotics were changed to cefepime, Flagyl, and vancomycin per our discussion with ID Katrina Meyer (prior to the foot culture). Encephalopathy workup has been essentially unremarkable. The patient has advanced dementia and Parkinson's disease coupled with intermittent hypotension and metabolic derangements which likely causes recurrent encephalopathy. She was followed by a neurologist Dr. Merlene Meyer. She is on hemodialysis and received hemodialysis on last Friday. However, it was noted that there was no thrill or bruit at the right upper  extremity fistula this morning. This is presumed to be secondary to a clotted off fistula. Therefore, the patient needed to be transferred for intervention of the clot or insertion of a new temporary hemodialysis catheter. This was discussed with vascular surgeon, Katrina Meyer who agreed to evaluate her when she arrives at [facility].  Katrina Meyer will need to be notified when the patient arrives at [facility]. Eventually, nephrology will need to be consulted. Her primary nephrologist is Dr. Florene Meyer   Assessment/Plan: Acute encephalopathy. Katrina Meyer assessment noted. Dr. Merlene Meyer opined that the patient's cognitive deficit or dementia and likely progressive Parkinson's disease are likely contributing to her mutism and intermittent unresponsiveness. Given her history of previous strokes, multi-infarct or vascular dementia is likely. Per Katrina Meyer (her daughter), the patient has long-standing dementia and Parkinson's disease. At baseline, the patient talks intermittently and often she does not talk at all. This is person dependent. EEG revealed moderate generalized slowing, but no epileptiform activity. Her mental status is likely back to baseline. She is a full code and the family request ongoing active treatment of all of her medical conditions, although the patient is very debilitated Parkinson's disease. Dr. Merlene Meyer increased the dose of Sinemet to see if it helps with bradykinesia and speech impairment. History of strokes/vascular dementia. No new strokes per MRI brain. Stool guaiac positive, but she is on a PPI.  Plavix resumed but with caution due to her history of GI bleeding. Ammonia level within normal limits. Vitamin B12 level elevated, not deficient. RPR nonreactive. HIV nonreactive.  Right heel osteomyelitis. This was confirmed per MRI. Patient was on antibiotics from 05/13/13-05/30/13 at [facility] and discharged on oral Levaquin. She was started on vancomycin and Zosyn on admission. Discussed case with  Katrina Meyer of ID who recommended that given length of antibiotics and  no culture data back on right heel in setting of profuse diarrhea that vancomycin, flagy and cefipime could be given. Recommended flagyl to be po if possible. Will adjust antibiotics accordingly. Culture results of the right heel is pending. Katrina Meyer indicated then length of treatment will depend on culture and sensitivity data. May need to "restart the clock" if growing something resistant.   She could potentially receive vancomycin outpatient for 3-6 weeks at the dialysis center. One out of 2 blood cultures positive for Staphylococcus species, likely a contaminant. Her white blood cell count is trending downward. She is currently afebrile.  <<<PAD with critical limb ischemia right upper extremity/right lower extremity. The patient was hospitalized at [facility] from 05/13/2013 through 05/29/2013. During that time, she underwent: -Status post I&D of deep abscess and third right ray amputation (05/18/13) status post repeat I&D and wound closure of right hand by Dr. Stann Mainland, 05/24/13. Continued wound care of areas of skin necrosis. -Status post proximalization/revision of AVG, ligation of distal AVG, endarterectomy right brachial artery 05/17/13 by Katrina Meyer. -Status post PTA right ulnar 90% to 20% residual with steal phenomena and threatened hand. Wound cultures grew Serratia. -Status post right lower extremity arteriogram 05/25/13, status post mechanical thrombectomy; PTA/stent 80% AT artery to 30% et Katrina Meyer. Placement of lytic catheter during procedure with administration of lytics overnight to improve flow. DES was placed at the proximal edge of the AT stent. -Followup with vascular surgery 06/09/13 at 10:15 a.m. with Katrina Meyer.>>>   Multiple pressure/ischemic ulcerations- wounds. Wound care consultation and recommendations noted. Status post sharp debridement, Santyl ointment, dressing changes, air mattress, prolonged boot, etc. Obtained  specimen from the right heel for culturing. The patient is noted to have dry gangrene almost of her right foot. She will followup with vascular surgeon Katrina Meyer as previously scheduled. Loose stools/diarrhea which are greenish black in color. The extent of stools is slowing. The rectal tube had been discontinued as the nursing staff felt that the tube was not draining properly. Per her daughter, Katrina Meyer, the patient has a history of antibiotic-induced diarrhea. C. difficile PCR was negative.  Continue  Imodium twice a day. Continue Flagyl. Continue flora Q. Discontinue Nepro as Katrina Meyer believes it causes more loose stools. Resource beverage given. Elevated troponin I. This may have been related to tachycardia with rapid atrial fibrillation and/or underlying decreased clearance in the setting of end-stage renal disease. 2-D echocardiogram revealed no left ventricular wall motion abnormalities and the patient had preserved LV function. Given her chronic debilitation and dementia, aggressive diagnostic workup deferred. Continue Plavix and statin. Discontinue IV metoprolol and restart carvedilol at a lower dose with parameters. She is eating better.  Atrial fibrillation with rapid ventricular response. Her heart rate is trending up. IV metoprolol was discontinued and Coreg was restarted on 5/2, but her oral intake is intermittent. We'll continue oral carvedilol and add when necessary metoprolol. We'll give 1 dose of diltiazem x1 today. Carvedilol dosing can be increased if her blood pressure can tolerate it. Plavix resumed but with caution. We'll monitor for GI bleeding.  Query dysphagia versus poor oral intake due to to dementia and Parkinson's disease. Speech therapy consultation/evaluation pending. Patient will take full liquids depending on who presents it. Her diet was advanced to dysphagia 2.  The nursing staff was given specific instructions to not feed the patient unless she was totally alert and able to follow  directions. With family assistance, the patient tends to eat better. Hypotension following hemodialysis. This is likely  chronic. Midodrine was added on hemodialysis days per nephrology.  Hypothyroidism. [name] TSH is slightly low, but free T4 is within normal limits at 0.99. Synthroid was restarted after it was held for several days.   Hypokalemia. This is associated with losses from diarrhea. Serum potassium data status post oral repletion and dialysate changes. ESRD: MWF dialysis. Appreciate nephrology assistance. Dialysis today Diabetes: CBG range 126-134. Continue SSI. HgA1c 6.0  Severe protein calorie malnutrition/anorexia. At baseline, the patient eats little. However, according to family, she is eating better. We'll advance her diet to dysphagia 2 diet. We'll still ask for speech therapy evaluation. [name] daughter, Katrina Meyer requests that the Nepro be stopped as it exacerbates the patient's diarrhea. Therefore, we'll discontinue it and restart Resource supplement.    Code Status: full Family Communication: Discussed with granddaughter. Disposition Plan: Discharge with clinically appropriate.   Consultants: Nephrology Neurology   Procedures: 2 d-echo:  Vigorous systolic function with obliteration of the LV cavity, EF > 70%. The cavity size was mildly reduced. Wall thickness was increased in a pattern of moderate LVH. Wall motion was normal; there were no regional wall motion abnormalities. The study was not technically sufficient to allow evaluation of LV diastolic dysfunction due to atrial fibrillation    Antibiotics: Vancomycin 05/31/13>.  Zosyn 05/31/13>>06/04/13 Flagyl 06/04/13>>> cefipime 06/04/13>>>  HPI/Subjective: The patient is much more alert. She still does not speak much at all. She does give good eye contact. Her granddaughter is in the room and is feeding her. The patient is taking a few bites with encouragement.     Objective: Filed Vitals:    06/06/13 0732  BP: 110/60  Pulse: 127  Temp: 98.9 F (37.2 C)  Resp: 18   temperature 98.9.   Intake/Output Summary (Last 24 hours) at 06/06/13 1155 Last data filed at 06/05/13 1821  Gross per 24 hour  Intake     50 ml  Output      0 ml  Net     50 ml   Filed Weights   06/02/13 0535 06/02/13 1045 06/03/13 0619  Weight: 55.4 kg (122 lb 2.2 oz) 53.7 kg (118 lb 6.2 oz) 56.7 kg (125 lb)    Exam:   General: alert 80 year old African-American woman sitting up in bed, in no acute distress.   Cardiovascular: irregular, irregular, with a 2/6 systolic murmur and tachycardia. No left upper edema. No right lower extremity edema.   Respiratory: clear anteriorly with decreased breath sounds in the bases.   Abdomen: soft, Positive bowel sounds, soft, nontender, nondistended.   Musculoskeletal/extremities/skin : no acute hot red joints. Left stump intact. Bandage is taken off of the sacrum. Noted stage 4-5 cm unstageable pressure ulcers on the sacrum without purulent drainage and with clean base. Right hand with sutures in place dorsal surface; necrotic appearing eschar with a small amount of. When drainage between the third and fourth digit. Right foot with large area of dry gangrene of the heel and alongside the lateral aspect of the foot. Scant amount of non-malodorous drainage. Right foot toes and right fingers on the hand cool to touch.  Neurologic: The patient is alert and she gives good eye contact, but she is mostly mute. No obvious facial droop.    Data Reviewed: Basic Metabolic Panel:  Recent Labs Lab 06/02/13 0613 06/03/13 0603 06/04/13 0557 06/05/13 0644 06/06/13 0619  NA 147 148* 147 145 145  K 3.9 2.9* 3.1* 3.6* 3.6*  CL 101 104 105 105 107  CO2 29  31 27 31 26   GLUCOSE 76 121* 135* 130* 154*  BUN 17 8 12 6 9   CREATININE 3.49* 2.04* 2.74* 1.66* 2.31*  CALCIUM 10.9* 9.3 9.7 8.8 9.3  MG  --  1.8  --   --   --   PHOS  --  3.1  --  2.0* 2.4   Liver Function  Tests:  Recent Labs Lab 05/31/13 1831 06/03/13 0603 06/06/13 0619  AST 20  --   --   ALT 5  --   --   ALKPHOS 190*  --   --   BILITOT 0.6  --   --   PROT 5.7*  --   --   ALBUMIN 1.6* 1.2* 1.1*   No results found for this basename: LIPASE, AMYLASE,  in the last 168 hours  Recent Labs Lab 05/31/13 2140  AMMONIA 10*   CBC:  Recent Labs Lab 05/31/13 1827 06/02/13 0845 06/03/13 0602 06/04/13 0557 06/05/13 0644 06/06/13 0619  WBC 19.9* 16.7* 15.3* 17.1* 14.8* 13.6*  NEUTROABS 16.0*  --   --   --   --   --   HGB 12.8 13.1 11.0* 11.2* 11.3* 10.7*  HCT 38.4 40.1 33.8* 34.8* 34.4* 32.6*  MCV 93.4 92.8 93.4 93.8 93.0 93.4  PLT 295 247 270 266 253 237   Cardiac Enzymes:  Recent Labs Lab 05/31/13 1827 05/31/13 2040 06/01/13 0603 06/03/13 0602  CKTOTAL  --  36  --   --   CKMB  --  2.3  --   --   TROPONINI <0.30  --  0.48* 0.52*   BNP (last 3 results)  Recent Labs  03/02/13 1645  PROBNP 13840.0*   CBG:  Recent Labs Lab 06/04/13 2046 06/05/13 0801 06/05/13 1119 06/05/13 1627 06/05/13 2114  GLUCAP 156* 120* 208* 143* 150*    Recent Results (from the past 240 hour(s))  CULTURE, BLOOD (ROUTINE X 2)     Status: None   Collection Time    05/31/13  6:27 PM      Result Value Ref Range Status   Specimen Description BLOOD LEFT ANTECUBITAL   Final   Special Requests     Final   Value: BOTTLES DRAWN AEROBIC AND ANAEROBIC 6CC  IMMUNE:COMPROMISED   Culture NO GROWTH 5 DAYS   Final   Report Status 06/05/2013 FINAL   Final  CULTURE, BLOOD (ROUTINE X 2)     Status: None   Collection Time    05/31/13  7:13 PM      Result Value Ref Range Status   Specimen Description BLOOD LEFT HAND   Final   Special Requests BOTTLES DRAWN AEROBIC ONLY 4CC  IMMUNE:COMPROMISED   Final   Culture  Setup Time     Final   Value: 06/03/2013 10:10     Performed at [facility]   Culture     Final   Value: STAPHYLOCOCCUS SPECIES     Note: Gram Stain Report Called to,Read Back  By and Verified With: BULLINS MEGAN AT 7902 ON 06/03/13 BY Tyrone Schimke M Performed at [facility]     Performed at [facility]   Report Status PENDING   Incomplete  CLOSTRIDIUM DIFFICILE BY PCR     Status: None   Collection Time    06/02/13  2:30 PM      Result Value Ref Range Status   C difficile by pcr NEGATIVE  NEGATIVE Final  WOUND CULTURE     Status: None  Collection Time    06/05/13  4:04 PM      Result Value Ref Range Status   Specimen Description FOOT   Final   Special Requests Immunocompromised   Final   Gram Stain     Final   Value: NO WBC SEEN     MODERATE SQUAMOUS EPITHELIAL CELLS PRESENT     ABUNDANT GRAM NEGATIVE RODS     MODERATE GRAM POSITIVE COCCI IN PAIRS     IN CLUSTERS   Culture PENDING   Incomplete   Report Status PENDING   Incomplete     Studies: No results found.  Scheduled Meds: . acidophilus  1 capsule Oral Daily  . atorvastatin  10 mg Oral q morning - 10a  . carbidopa-levodopa  2 tablet Oral TID  . carvedilol  3.125 mg Oral BID WC  . ceFEPime (MAXIPIME) IV  2 g Intravenous Q M,W,F-HD  . clopidogrel  75 mg Oral Q breakfast  . collagenase   Topical Daily  . donepezil  10 mg Oral QHS  . feeding supplement (RESOURCE BREEZE)  1 Container Oral BID BM  . insulin aspart  0-5 Units Subcutaneous QHS  . insulin aspart  0-9 Units Subcutaneous TID WC  . levothyroxine  125 mcg Oral QAC breakfast  . loperamide  2 mg Oral BID  . metroNIDAZOLE  250 mg Oral 3 times per day  . midodrine  10 mg Oral Q M,W,F-HD  . multivitamin  1 tablet Oral BID  . pantoprazole (PROTONIX) IV  40 mg Intravenous Q24H  . pentafluoroprop-tetrafluoroeth  1 application Topical Daily  . sodium chloride  3 mL Intravenous Q12H  . sodium chloride  3 mL Intravenous Q12H  . vancomycin  500 mg Intravenous Q M,W,F-HD   Continuous Infusions: . dextrose 5 % and 0.9 % NaCl with KCl 20 mEq/L 50 mL/hr at 06/06/13 3875    Principal Problem:   Osteomyelitis of right foot Active  Problems:   Peripheral vascular disease   Dry gangrene   Atrial fibrillation   Parkinson's disease   Hypertension   Dementia   Acute encephalopathy   Protein-calorie malnutrition, severe   Hypoglycemia   Leukocytosis, unspecified   Elevated troponin I level   Melena   ESRD on dialysis   Diarrhea   History of stroke    Time spent: 40 minutes (including discussion of the patient with her granddaughter, vascular surgeon Katrina Meyer, and calling Dr. Clementeen Graham.)    [facility] Pager [phone]. If 7PM-7AM, please contact night-coverage at [email or web address], password [facility] 06/06/2013, 11:55 AM  LOS: 6 days

## 2013-06-06 NOTE — Progress Notes (Signed)
Received from [facility] via [facility] via stretcher. Family to come later. Tele applied. Patient assessed.

## 2013-06-06 NOTE — Progress Notes (Signed)
Patient with multiple areas of skin issues as follows: Right hand,pinky finger skin tear 5 cm x 2 cm, black,intact,no drainage Right middle finger amputation site with sutures from mid dorsal to ventral with 14 sutures intact. Questionable area dehisced between dorsal and ventral area, otherwise, margins intact, well approximated Right hand dorsal surface dark and discolored Entire perineal,buttocks,thighs and sacral area with MSAD-multiple areas where skin has sloughed off-will refer to [facility] Skin damage secondary to vascular disease noted dorsal surface of right foot,right great toe, 2nd toe and 3rd toe-no drainage,no odor,dark discoloration Dry dressing applied as per protocol Contracture of left hand Old left AKA intact

## 2013-06-07 ENCOUNTER — Encounter (HOSPITAL_COMMUNITY): Payer: Medicare Other | Admitting: Anesthesiology

## 2013-06-07 ENCOUNTER — Encounter (HOSPITAL_COMMUNITY)
Admission: EM | Disposition: A | Discharge status: Home / Self Care | Payer: Self-pay | Source: Home / Self Care | Attending: Internal Medicine

## 2013-06-07 ENCOUNTER — Inpatient Hospital Stay (HOSPITAL_COMMUNITY): Payer: Medicare Other

## 2013-06-07 ENCOUNTER — Inpatient Hospital Stay (HOSPITAL_COMMUNITY): Payer: Medicare Other | Admitting: Anesthesiology

## 2013-06-07 DIAGNOSIS — N186 End stage renal disease: Secondary | ICD-10-CM

## 2013-06-07 DIAGNOSIS — N039 Chronic nephritic syndrome with unspecified morphologic changes: Secondary | ICD-10-CM

## 2013-06-07 DIAGNOSIS — N189 Chronic kidney disease, unspecified: Secondary | ICD-10-CM

## 2013-06-07 DIAGNOSIS — D631 Anemia in chronic kidney disease: Secondary | ICD-10-CM

## 2013-06-07 DIAGNOSIS — T82898A Other specified complication of vascular prosthetic devices, implants and grafts, initial encounter: Secondary | ICD-10-CM

## 2013-06-07 HISTORY — PX: INSERTION OF DIALYSIS CATHETER: SHX1324

## 2013-06-07 LAB — RENAL FUNCTION PANEL
ALBUMIN: 1.1 g/dL — AB (ref 3.5–5.2)
BUN: 13 mg/dL (ref 6–23)
CHLORIDE: 106 meq/L (ref 96–112)
CO2: 23 mEq/L (ref 19–32)
CREATININE: 2.81 mg/dL — AB (ref 0.50–1.10)
Calcium: 9.3 mg/dL (ref 8.4–10.5)
GFR calc Af Amer: 17 mL/min — ABNORMAL LOW (ref >90)
GFR calc non Af Amer: 15 mL/min — ABNORMAL LOW (ref >90)
Glucose, Bld: 44 mg/dL — CL (ref 70–99)
PHOSPHORUS: 2.3 mg/dL (ref 2.3–4.6)
Potassium: 4.2 mEq/L (ref 3.7–5.3)
Sodium: 144 mEq/L (ref 137–147)

## 2013-06-07 LAB — GLUCOSE, CAPILLARY
GLUCOSE-CAPILLARY: 69 mg/dL — AB (ref 70–99)
GLUCOSE-CAPILLARY: 80 mg/dL (ref 70–99)
GLUCOSE-CAPILLARY: 154 mg/dL — AB (ref 70–99)
Glucose-Capillary: 152 mg/dL — ABNORMAL HIGH (ref 70–99)
Glucose-Capillary: 207 mg/dL — ABNORMAL HIGH (ref 70–99)
Glucose-Capillary: 61 mg/dL — ABNORMAL LOW (ref 70–99)
Glucose-Capillary: 87 mg/dL (ref 70–99)
Glucose-Capillary: 89 mg/dL (ref 70–99)
Glucose-Capillary: 113 mg/dL — ABNORMAL HIGH (ref 70–99)
Glucose-Capillary: 118 mg/dL — ABNORMAL HIGH (ref 70–99)

## 2013-06-07 LAB — CULTURE, BLOOD (ROUTINE X 2)

## 2013-06-07 LAB — TROPONIN I: Troponin I: <0.3 ng/mL (ref <0.30)

## 2013-06-07 LAB — VANCOMYCIN, RANDOM: Vancomycin Rm: 12.4 ug/mL

## 2013-06-07 SURGERY — INSERTION OF DIALYSIS CATHETER
Anesthesia: Monitor Anesthesia Care | Site: Groin | Laterality: Right

## 2013-06-07 MED ORDER — PHENYLEPHRINE HCL 10 MG/ML IJ SOLN
INTRAMUSCULAR | Status: DC | PRN
Start: 2013-06-07 — End: 2013-06-07
  Administered 2013-06-07 (×2): 120 ug via INTRAVENOUS

## 2013-06-07 MED ORDER — FENTANYL CITRATE 0.05 MG/ML IJ SOLN: 25.0000 ug | INTRAMUSCULAR | Status: DC | PRN

## 2013-06-07 MED ORDER — DEXTROSE 50 % IV SOLN
25.0000 mL | Freq: Once | INTRAVENOUS | Status: AC | PRN
  Administered 2013-06-07 (×2): 25 mL via INTRAVENOUS
  Filled 2013-06-07: qty 50

## 2013-06-07 MED ORDER — DROPERIDOL 2.5 MG/ML IJ SOLN
0.6250 mg | INTRAMUSCULAR | Status: DC | PRN
  Filled 2013-06-07: qty 0.25

## 2013-06-07 MED ORDER — LIDOCAINE-EPINEPHRINE (PF) 1 %-1:200000 IJ SOLN
INTRAMUSCULAR | Status: DC | PRN
  Administered 2013-06-07: 30 mL

## 2013-06-07 MED ORDER — HEPARIN SODIUM (PORCINE) 1000 UNIT/ML IJ SOLN
INTRAMUSCULAR | Status: AC
  Filled 2013-06-07: qty 1

## 2013-06-07 MED ORDER — SODIUM CHLORIDE 0.9 % IV SOLN
Freq: Once | INTRAVENOUS | Status: AC
  Administered 2013-06-07: 17:00:00 via INTRAVENOUS

## 2013-06-07 MED ORDER — SODIUM CHLORIDE 0.9 % IV SOLN
INTRAVENOUS | Status: DC | PRN
  Administered 2013-06-07: 13:00:00 via INTRAVENOUS

## 2013-06-07 MED ORDER — SODIUM CHLORIDE 0.9 % IV BOLUS (SEPSIS)
250.0000 mL | Freq: Once | INTRAVENOUS | Status: AC
  Administered 2013-06-07: 250 mL via INTRAVENOUS

## 2013-06-07 MED ORDER — 0.9 % SODIUM CHLORIDE (POUR BTL) OPTIME
TOPICAL | Status: DC | PRN
  Administered 2013-06-07: 1000 mL

## 2013-06-07 MED ORDER — ALBUMIN HUMAN 5 % IV SOLN
12.5000 g | Freq: Once | INTRAVENOUS | Status: AC
  Administered 2013-06-07: 12.5 g via INTRAVENOUS

## 2013-06-07 MED ORDER — LIDOCAINE HCL (CARDIAC) 20 MG/ML IV SOLN
INTRAVENOUS | Status: AC
  Filled 2013-06-07: qty 5

## 2013-06-07 MED ORDER — FENTANYL CITRATE 0.05 MG/ML IJ SOLN
INTRAMUSCULAR | Status: AC
  Filled 2013-06-07: qty 5

## 2013-06-07 MED ORDER — SODIUM CHLORIDE 0.9 % IR SOLN
Status: DC | PRN
  Administered 2013-06-07: 13:00:00

## 2013-06-07 MED ORDER — PHENYLEPHRINE HCL 10 MG/ML IJ SOLN
30.0000 ug/min | INTRAVENOUS | Status: DC
  Administered 2013-06-07 – 2013-06-08 (×2): 80 ug/min via INTRAVENOUS
  Administered 2013-06-08: 100 ug/min via INTRAVENOUS
  Administered 2013-06-09 (×3): 200 ug/min via INTRAVENOUS
  Administered 2013-06-09: 50 ug/min via INTRAVENOUS
  Administered 2013-06-10: 180 ug/min via INTRAVENOUS
  Administered 2013-06-10: 60 ug/min via INTRAVENOUS
  Administered 2013-06-10: 200 ug/min via INTRAVENOUS
  Administered 2013-06-11 (×2): 100 ug/min via INTRAVENOUS
  Filled 2013-06-07 (×14): qty 4

## 2013-06-07 MED ORDER — HEPARIN SODIUM (PORCINE) 1000 UNIT/ML IJ SOLN
INTRAMUSCULAR | Status: DC | PRN
  Administered 2013-06-07: 10 mL

## 2013-06-07 MED ORDER — CHLORHEXIDINE GLUCONATE 0.12 % MT SOLN
15.0000 mL | Freq: Two times a day (BID) | OROMUCOSAL | Status: DC
  Administered 2013-06-07 – 2013-06-12 (×10): 15 mL via OROMUCOSAL
  Filled 2013-06-07 (×8): qty 15

## 2013-06-07 MED ORDER — ALBUMIN HUMAN 5 % IV SOLN
INTRAVENOUS | Status: AC
Start: 2013-06-07 — End: 2013-06-08
  Filled 2013-06-07: qty 250

## 2013-06-07 MED ORDER — PHENYLEPHRINE 40 MCG/ML (10ML) SYRINGE FOR IV PUSH (FOR BLOOD PRESSURE SUPPORT)
PREFILLED_SYRINGE | INTRAVENOUS | Status: AC
  Filled 2013-06-07: qty 10

## 2013-06-07 MED ORDER — LEVOTHYROXINE SODIUM 25 MCG PO TABS
25.0000 ug | ORAL_TABLET | Freq: Every day | ORAL | Status: DC
  Administered 2013-06-08 – 2013-06-17 (×10): 25 ug via ORAL
  Filled 2013-06-07 (×12): qty 1

## 2013-06-07 MED ORDER — FENTANYL CITRATE 0.05 MG/ML IJ SOLN
INTRAMUSCULAR | Status: DC | PRN
  Administered 2013-06-07 (×2): 50 ug via INTRAVENOUS

## 2013-06-07 MED ORDER — PHENYLEPHRINE HCL 10 MG/ML IJ SOLN
30.0000 ug/min | INTRAVENOUS | Status: DC
  Administered 2013-06-07 (×2): 60 ug/min via INTRAVENOUS
  Filled 2013-06-07: qty 1

## 2013-06-07 MED ORDER — BIOTENE DRY MOUTH MT LIQD
15.0000 mL | Freq: Two times a day (BID) | OROMUCOSAL | Status: DC
  Administered 2013-06-08 – 2013-06-11 (×8): 15 mL via OROMUCOSAL

## 2013-06-07 SURGICAL SUPPLY — 43 items
BAG DECANTER FOR FLEXI CONT (MISCELLANEOUS) ×3 IMPLANT
BLADE 10 SAFETY STRL DISP (BLADE) ×3 IMPLANT
CATH CANNON HEMO 15F 50CM (CATHETERS) ×3 IMPLANT
CATH CANNON HEMO 15FR 19 (HEMODIALYSIS SUPPLIES) IMPLANT
CATH CANNON HEMO 15FR 23CM (HEMODIALYSIS SUPPLIES) IMPLANT
CATH CANNON HEMO 15FR 31CM (HEMODIALYSIS SUPPLIES) IMPLANT
CATH CANNON HEMO 15FR 32CM (HEMODIALYSIS SUPPLIES) IMPLANT
CATH STRAIGHT 5FR 65CM (CATHETERS) IMPLANT
COVER PROBE W GEL 5X96 (DRAPES) ×3 IMPLANT
COVER SURGICAL LIGHT HANDLE (MISCELLANEOUS) ×3 IMPLANT
DERMABOND ADVANCED (GAUZE/BANDAGES/DRESSINGS) ×2
DERMABOND ADVANCED .7 DNX12 (GAUZE/BANDAGES/DRESSINGS) ×1 IMPLANT
DRAPE C-ARM 42X72 X-RAY (DRAPES) ×3 IMPLANT
DRAPE CHEST BREAST 15X10 FENES (DRAPES) ×3 IMPLANT
GAUZE SPONGE 2X2 8PLY STRL LF (GAUZE/BANDAGES/DRESSINGS) ×1 IMPLANT
GAUZE SPONGE 4X4 16PLY XRAY LF (GAUZE/BANDAGES/DRESSINGS) ×3 IMPLANT
GLOVE BIO SURGEON STRL SZ7 (GLOVE) ×3 IMPLANT
GLOVE BIOGEL PI IND STRL 7.5 (GLOVE) ×1 IMPLANT
GLOVE BIOGEL PI INDICATOR 7.5 (GLOVE) ×2
GOWN STRL REUS W/ TWL LRG LVL3 (GOWN DISPOSABLE) ×2 IMPLANT
GOWN STRL REUS W/TWL LRG LVL3 (GOWN DISPOSABLE) ×4
KIT BASIN OR (CUSTOM PROCEDURE TRAY) ×3 IMPLANT
KIT ROOM TURNOVER OR (KITS) ×3 IMPLANT
NEEDLE 18GX1X1/2 (RX/OR ONLY) (NEEDLE) ×3 IMPLANT
NEEDLE HYPO 25GX1X1/2 BEV (NEEDLE) ×3 IMPLANT
NS IRRIG 1000ML POUR BTL (IV SOLUTION) ×3 IMPLANT
PACK SURGICAL SETUP 50X90 (CUSTOM PROCEDURE TRAY) ×3 IMPLANT
PAD ARMBOARD 7.5X6 YLW CONV (MISCELLANEOUS) ×6 IMPLANT
SET MICROPUNCTURE 5F STIFF (MISCELLANEOUS) IMPLANT
SOAP 2 % CHG 4 OZ (WOUND CARE) ×3 IMPLANT
SPONGE GAUZE 2X2 STER 10/PKG (GAUZE/BANDAGES/DRESSINGS) ×2
SUT ETHILON 3 0 PS 1 (SUTURE) ×3 IMPLANT
SUT MNCRL AB 4-0 PS2 18 (SUTURE) ×3 IMPLANT
SYR 20CC LL (SYRINGE) ×6 IMPLANT
SYR 3ML LL SCALE MARK (SYRINGE) ×3 IMPLANT
SYR 5ML LL (SYRINGE) ×3 IMPLANT
SYR CONTROL 10ML LL (SYRINGE) ×3 IMPLANT
SYRINGE 10CC LL (SYRINGE) ×3 IMPLANT
TAPE CLOTH SURG 4X10 WHT LF (GAUZE/BANDAGES/DRESSINGS) ×3 IMPLANT
TOWEL OR 17X24 6PK STRL BLUE (TOWEL DISPOSABLE) ×3 IMPLANT
TOWEL OR 17X26 10 PK STRL BLUE (TOWEL DISPOSABLE) ×6 IMPLANT
WATER STERILE IRR 1000ML POUR (IV SOLUTION) ×3 IMPLANT
WIRE AMPLATZ SS-J .035X180CM (WIRE) IMPLANT

## 2013-06-07 NOTE — Progress Notes (Signed)
Dr Glennon Mac paged to bedside for low BP. Left upper arm PIV appears infiltrated. New left hand # 22 G started w/one attempt by Dr Glennon Mac. IV Albumin infusing as ord. R fem Diatek was accessed by CRNA L. Key and IV Neo drip begun & titrated to get SBP >90. Pt's color pale, opens her eyes when called. She is non-verbal per CRNA Almyra Free. CXR done. Dr Bridgett Larsson updated by Dr Glennon Mac. Will cont to monitor closely. Report to [facility]  as primary caregiver.

## 2013-06-07 NOTE — Progress Notes (Signed)
[facility] PROGRESS NOTE  Katrina Meyer MPN:[id number] DOB: 05-25-1932 DOA: 05/31/2013 PCP: Glenda Chroman., MD  Assessment/Plan:   Patient with intermittent hypotension during the hospitalization was TF to VVS procedure from [facility] to o [place] [facility] on 5/3. she developed hypotension, tachycardia after the procedure and; vascular surgery was concerned about possible ongoing sepsis and asked PCCM evaluation; -patient is currently on pressors and being admitted to ICU under PCCM care; [facility] will sign off for now ; please call with questions;      Brief summary  The patient is a 80 year old woman with advanced dementia, likely vascular dementia, advanced Parkinson's disease, and severe peripheral vascular disease. She was recently hospitalized at [facility] in [place] from 05/13/13 through 05/29/13 for management of peripheral arterial disease with critical limb ischemia in the right upper extremity/right lower extremity. During that hospitalization, the patient had a number of vascularization procedures (see note below). She has dry gangrene of her right foot. She had incision and drainage of abscess on her right hand and/or right foot She was treated with vancomycin and Zosyn during most of the hospitalization. Her hand wound culture grew out Serratia and she was discharged on oral Levaquin. She presented to [facility] on 05/31/2013 for altered mental status associated with hemodialysis related hypotension. Sepsis was not assumed, but she was started on vancomycin and Zosyn. Studies were ordered and revealed osteomyelitis of the right heel. 2-D echocardiogram revealed preserved left ventricular function. Wound culture of the right foot is pending, but reveals abundant gram-negative rods and moderate gram-positive cocci. One out of 2 blood cultures was positive for Staphylococcus species. Antibiotics were changed to cefepime, Flagyl, and vancomycin per our discussion with ID Dr. Megan Salon (prior to the foot  culture). Encephalopathy workup has been essentially unremarkable. The patient has advanced dementia and Parkinson's disease coupled with intermittent hypotension and metabolic derangements which likely causes recurrent encephalopathy. She was followed by a neurologist Dr. [name]. She is on hemodialysis and received hemodialysis on last Friday. However, it was noted that there was no thrill or bruit at the right upper extremity fistula this morning. This is presumed to be secondary to a clotted off fistula. Therefore, the patient needed to be transferred for intervention of the clot or insertion of a new temporary hemodialysis catheter. This was discussed with vascular surgeon, Dr. Juanda Crumble FIelds who agreed to evaluate her when she arrives at [facility].   Assessment/Plan:  Acute encephalopathy. Dr. [name] assessment noted. Dr. [name] opined that the patient's cognitive deficit or dementia and likely progressive Parkinson's disease are likely contributing to her mutism and intermittent unresponsiveness. Given her history of previous strokes, multi-infarct or vascular dementia is likely. Per [name] (her daughter), the patient has long-standing dementia and Parkinson's disease. At baseline, the patient talks intermittently and often she does not talk at all. This is person dependent. EEG revealed moderate generalized slowing, but no epileptiform activity. Her mental status is likely back to baseline. She is a full code and the family request ongoing active treatment of all of her medical conditions, although the patient is very debilitated  Parkinson's disease. Dr. [name] increased the dose of Sinemet to see if it helps with bradykinesia and speech impairment.  History of strokes/vascular dementia. No new strokes per MRI brain. Stool guaiac positive, but she is on a PPI. Plavix resumed but with caution due to her history of GI bleeding. Ammonia level within normal limits. Vitamin B12 level elevated, not  deficient. RPR nonreactive. HIV nonreactive.  Right heel  osteomyelitis. This was confirmed per MRI. Patient was on antibiotics from 05/13/13-05/30/13 at [facility] and discharged on oral Levaquin. She was started on vancomycin and Zosyn on admission. Discussed case with Dr. Orvan Falconer of ID who recommended that given length of antibiotics and no culture data back on right heel in setting of profuse diarrhea that vancomycin, flagy and cefipime could be given. Recommended flagyl to be po if possible. Will adjust antibiotics accordingly. Culture results of the right heel is pending. Dr. Orvan Falconer indicated then length of treatment will depend on culture and sensitivity data. May need to "restart the clock" if growing something resistant. She could potentially receive vancomycin outpatient for 3-6 weeks at the dialysis center. One out of 2 blood cultures positive for Staphylococcus species, likely a contaminant. Her white blood cell count is trending downward. She is currently afebrile.  <<<PAD with critical limb ischemia right upper extremity/right lower extremity. The patient was hospitalized at [facility] from 05/13/2013 through 05/29/2013. During that time, she underwent:  -Status post I&D of deep abscess and third right ray amputation (05/18/13) status post repeat I&D and wound closure of right hand by Dr. Aldona Bar, 05/24/13. Continued wound care of areas of skin necrosis.  -Status post proximalization/revision of AVG, ligation of distal AVG, endarterectomy right brachial artery 05/17/13 by Dr. Selena Batten.  -Status post PTA right ulnar 90% to 20% residual with steal phenomena and threatened hand. Wound cultures grew Serratia.  -Status post right lower extremity arteriogram 05/25/13, status post mechanical thrombectomy; PTA/stent 80% AT artery to 30% et Karie Soda. Placement of lytic catheter during procedure with administration of lytics overnight to improve flow. DES was placed at the proximal edge of the AT stent.  -Followup with  vascular surgery 06/09/13 at 10:15 a.m. with Dr. Kim.>>>  Multiple pressure/ischemic ulcerations- wounds. Wound care consultation and recommendations noted. Status post sharp debridement, Santyl ointment, dressing changes, air mattress, prolonged boot, etc. Obtained specimen from the right heel for culturing. The patient is noted to have dry gangrene almost of her right foot. She will followup with vascular surgeon Dr. Selena Batten as previously scheduled.  Loose stools/diarrhea which are greenish black in color. The extent of stools is slowing. The rectal tube had been discontinued as the nursing staff felt that the tube was not draining properly. Per her daughter, Stanton Kidney, the patient has a history of antibiotic-induced diarrhea. C. difficile PCR was negative. Continue Imodium twice a day. Continue Flagyl. Continue flora Q. Discontinue Nepro as Stanton Kidney believes it causes more loose stools. Resource beverage given.  Elevated troponin I. This may have been related to tachycardia with rapid atrial fibrillation and/or underlying decreased clearance in the setting of end-stage renal disease. 2-D echocardiogram revealed no left ventricular wall motion abnormalities and the patient had preserved LV function. Given her chronic debilitation and dementia, aggressive diagnostic workup deferred. Continue Plavix and statin. Discontinue IV metoprolol and restart carvedilol at a lower dose with parameters. She is eating better.  Atrial fibrillation with rapid ventricular response. Her heart rate is trending up. IV metoprolol was discontinued and Coreg was restarted on 5/2, but her oral intake is intermittent. We'll continue oral carvedilol and add when necessary metoprolol. We'll give 1 dose of diltiazem x1 today. Carvedilol dosing can be increased if her blood pressure can tolerate it. Plavix resumed but with caution. We'll monitor for GI bleeding.  Query dysphagia versus poor oral intake due to to dementia and Parkinson's disease.  Speech therapy consultation/evaluation pending. Patient will take full liquids depending on who presents  it. Her diet was advanced to dysphagia 2. The nursing staff was given specific instructions to not feed the patient unless she was totally alert and able to follow directions. With family assistance, the patient tends to eat better.  Hypotension following hemodialysis. This is likely chronic. Midodrine was added on hemodialysis days per nephrology.  Hypothyroidism. [name] TSH is slightly low, but free T4 is within normal limits at 0.99. Synthroid was restarted after it was held for several days.  Hypokalemia. This is associated with losses from diarrhea. Serum potassium data status post oral repletion and dialysate changes.  ESRD: MWF dialysis. Appreciate nephrology assistance. Dialysis today  Diabetes: CBG range 126-134. Continue SSI. HgA1c 6.0  Severe protein calorie malnutrition/anorexia. At baseline, the patient eats little. However, according to family, she is eating better. We'll advance her diet to dysphagia 2 diet. We'll still ask for speech therapy evaluation. [name] daughter, [name] requests that the Nepro be stopped as it exacerbates the patient's diarrhea. Therefore, we'll discontinue it and restart Resource supplement.  Code Status: full Family Communication:  D/w patient, nurse; family not at the bedside, no response to phone  (indicate person spoken with, relationship, and if by phone, the number) Disposition Plan: pend clinical improvement    Consultants:  Critical; care   Procedures:  IV HD cath placement   Antibiotics: Vancomycin 05/31/13>.  Zosyn 05/31/13>>06/04/13  Flagyl 06/04/13>>>  cefipime 06/04/13>>>  HPI/Subjective: Non verbal   Objective: Filed Vitals:   06/07/13 1515  BP: 94/45  Pulse: 106  Temp:   Resp: 16    Intake/Output Summary (Last 24 hours) at 06/07/13 1601 Last data filed at 06/07/13 1349  Gross per 24 hour  Intake    440 ml   Output      0 ml  Net    440 ml   Filed Weights   06/03/13 0619 06/06/13 2146 06/07/13 0500  Weight: 56.7 kg (125 lb) 60.147 kg (132 lb 9.6 oz) 60.102 kg (132 lb 8 oz)    Exam:   General:  Nonverbal   Cardiovascular: s1,s2 rrr  Respiratory: few bl crackles   Abdomen: soft,nt,nd   Musculoskeletal: L leg emputation    Data Reviewed: Basic Metabolic Panel:  Recent Labs Lab 06/03/13 0603 06/04/13 0557 06/05/13 0644 06/06/13 0619 06/07/13 0619  NA 148* 147 145 145 144  K 2.9* 3.1* 3.6* 3.6* 4.2  CL 104 105 105 107 106  CO2 31 27 31 26 23   GLUCOSE 121* 135* 130* 154* 44*  BUN 8 12 6 9 13   CREATININE 2.04* 2.74* 1.66* 2.31* 2.81*  CALCIUM 9.3 9.7 8.8 9.3 9.3  MG 1.8  --   --   --   --   PHOS 3.1  --  2.0* 2.4 2.3   Liver Function Tests:  Recent Labs Lab 05/31/13 1831 06/03/13 0603 06/06/13 0619 06/07/13 0619  AST 20  --   --   --   ALT 5  --   --   --   ALKPHOS 190*  --   --   --   BILITOT 0.6  --   --   --   PROT 5.7*  --   --   --   ALBUMIN 1.6* 1.2* 1.1* 1.1*   No results found for this basename: LIPASE, AMYLASE,  in the last 168 hours  Recent Labs Lab 05/31/13 2140  AMMONIA 10*   CBC:  Recent Labs Lab 05/31/13 1827 06/02/13 0845 06/03/13 0602 06/04/13 0557 06/05/13 0644 06/06/13  0619  WBC 19.9* 16.7* 15.3* 17.1* 14.8* 13.6*  NEUTROABS 16.0*  --   --   --   --   --   HGB 12.8 13.1 11.0* 11.2* 11.3* 10.7*  HCT 38.4 40.1 33.8* 34.8* 34.4* 32.6*  MCV 93.4 92.8 93.4 93.8 93.0 93.4  PLT 295 247 270 266 253 237   Cardiac Enzymes:  Recent Labs Lab 05/31/13 1827 05/31/13 2040 06/01/13 0603 06/03/13 0602  CKTOTAL  --  36  --   --   CKMB  --  2.3  --   --   TROPONINI <0.30  --  0.48* 0.52*   BNP (last 3 results)  Recent Labs  03/02/13 1645  PROBNP 13840.0*   CBG:  Recent Labs Lab 06/07/13 0738 06/07/13 0834 06/07/13 0913 06/07/13 1311 06/07/13 1406  GLUCAP 61* 69* 80 87 89    Recent Results (from the past 240 hour(s))   CULTURE, BLOOD (ROUTINE X 2)     Status: None   Collection Time    05/31/13  6:27 PM      Result Value Ref Range Status   Specimen Description BLOOD LEFT ANTECUBITAL   Final   Special Requests     Final   Value: BOTTLES DRAWN AEROBIC AND ANAEROBIC 6CC  IMMUNE:COMPROMISED   Culture NO GROWTH 5 DAYS   Final   Report Status 06/05/2013 FINAL   Final  CULTURE, BLOOD (ROUTINE X 2)     Status: None   Collection Time    05/31/13  7:13 PM      Result Value Ref Range Status   Specimen Description BLOOD LEFT HAND   Final   Special Requests BOTTLES DRAWN AEROBIC ONLY 4CC  IMMUNE:COMPROMISED   Final   Culture  Setup Time     Final   Value: 06/03/2013 10:10     Performed at [facility]   Culture     Final   Value: STAPHYLOCOCCUS SPECIES (COAGULASE NEGATIVE)     Note: IDENTIFIED AS STAPHYLOCOCCUS LUGDUNENSIS     Note: Gram Stain Report Called to,Read Back By and Verified With: BULLINS MEGAN AT 8841 ON 06/03/13 BY Tyrone Schimke M Performed at [facility]     Performed at [facility]   Report Status 06/07/2013 FINAL   Final   Organism ID, Bacteria STAPHYLOCOCCUS SPECIES (COAGULASE NEGATIVE)   Final  CLOSTRIDIUM DIFFICILE BY PCR     Status: None   Collection Time    06/02/13  2:30 PM      Result Value Ref Range Status   C difficile by pcr NEGATIVE  NEGATIVE Final  WOUND CULTURE     Status: None   Collection Time    06/05/13  4:04 PM      Result Value Ref Range Status   Specimen Description FOOT   Final   Special Requests Immunocompromised   Final   Gram Stain     Final   Value: NO WBC SEEN     MODERATE SQUAMOUS EPITHELIAL CELLS PRESENT     ABUNDANT GRAM NEGATIVE RODS     MODERATE GRAM POSITIVE COCCI IN PAIRS     IN CLUSTERS   Culture     Final   Value: MULTIPLE ORGANISMS PRESENT, NONE PREDOMINANT     Performed at [facility]   Report Status PENDING   Incomplete  MRSA PCR SCREENING     Status: None   Collection Time    06/06/13  8:14 PM      Result  Value  Ref Range Status   MRSA by PCR NEGATIVE  NEGATIVE Final   Comment:            The GeneXpert MRSA Assay (FDA     approved for NASAL specimens     only), is one component of a     comprehensive MRSA colonization     surveillance program. It is not     intended to diagnose MRSA     infection nor to guide or     monitor treatment for     MRSA infections.     Studies: Dg Chest Port 1 View  06/07/2013   CLINICAL DATA:  Status post placement of femoral approach dialysis catheter.  EXAM: PORTABLE CHEST - 1 VIEW  COMPARISON:  Single view of the chest 05/31/2013.  FINDINGS: Femoral approach central venous catheter is in place. Tip of the catheter projects at the superior cavoatrial junction. There is cardiomegaly without edema. The aorta is tortuous. No pneumothorax or pleural effusion. Vascular stent right axilla is again seen.  IMPRESSION: Tip of femoral approach venous catheter projects at the superior cavoatrial junction.  Cardiomegaly without edema.   Electronically Signed   By: Inge Rise M.D.   On: 06/07/2013 14:48    Scheduled Meds: . [MAR HOLD] acidophilus  1 capsule Oral Daily  . albumin human      . [facility] HOLD] atorvastatin  10 mg Oral q morning - 10a  . [facility] HOLD] carbidopa-levodopa  2 tablet Oral TID  . [facility] HOLD] carvedilol  3.125 mg Oral BID WC  . [MAR HOLD] ceFEPime (MAXIPIME) IV  2 g Intravenous Q M,W,F-HD  . [facility] HOLD] clopidogrel  75 mg Oral Q breakfast  . [MAR HOLD] collagenase   Topical Daily  . [MAR HOLD] donepezil  10 mg Oral QHS  . [MAR HOLD] feeding supplement (RESOURCE BREEZE)  1 Container Oral BID BM  . [MAR HOLD] insulin aspart  0-5 Units Subcutaneous QHS  . [MAR HOLD] insulin aspart  0-9 Units Subcutaneous TID WC  . [START ON 06/08/2013] levothyroxine  25 mcg Oral QAC breakfast  . [MAR HOLD] loperamide  2 mg Oral BID  . [facility] HOLD] metroNIDAZOLE  250 mg Oral 3 times per day  . [facility] HOLD] midodrine  10 mg Oral Q M,W,F-HD  . [facility] HOLD] multivitamin  1 tablet Oral  BID  . [MAR HOLD] pantoprazole (PROTONIX) IV  40 mg Intravenous Q24H  . [facility] HOLD] pentafluoroprop-tetrafluoroeth  1 application Topical Daily  . [MAR HOLD] sodium chloride  3 mL Intravenous Q12H  . [MAR HOLD] sodium chloride  3 mL Intravenous Q12H  . [facility] HOLD] vancomycin  500 mg Intravenous Q M,W,F-HD   Continuous Infusions: . dextrose 5 % and 0.9 % NaCl with KCl 20 mEq/L 50 mL/hr at 06/06/13 2117    Principal Problem:   Osteomyelitis of right foot Active Problems:   Atrial fibrillation   Parkinson's disease   Peripheral vascular disease   Hypertension   Dementia   Acute encephalopathy   Protein-calorie malnutrition, severe   Hypoglycemia   Leukocytosis, unspecified   Elevated troponin I level   Melena   ESRD on dialysis   Diarrhea   History of stroke   Dry gangrene    Time spent: >35 minutes     Kinnie Feil  [facility] Pager [phone]. If 7PM-7AM, please contact night-coverage at [email or web address], password [facility] 06/07/2013, 4:01 PM  LOS: 7 days

## 2013-06-07 NOTE — Op Note (Addendum)
OPERATIVE NOTE  PROCEDURE: 1. Right femoral vein tunneled dialysis catheter placement 2. Right internal jugular vein cannulation under ultrasound guidance 3. Right femoral vein cannulation under ultrasound guidance  PRE-OPERATIVE DIAGNOSIS: end-stage renal failure  POST-OPERATIVE DIAGNOSIS: same as above  SURGEON: Adele Barthel, MD  ANESTHESIA: local and MAC  ESTIMATED BLOOD LOSS: 30 cc  FINDING(S): 1.  Tips of the catheter in the right atrium on fluoroscopy 2.  Complete calcific outline of aortic and ililac anatomy  SPECIMEN(S):  none  INDICATIONS:   Katrina Meyer is a 80 y.o. female who  presents with end stage renal disease with clotted right upper arm arteriovenous graft in setting of right hand grangrene.  I told the family that I do not recommend thrombectomy as a functional access will decrease the already limited blood flow to her hand.  The patient presents for tunneled dialysis catheter placement.  The patient is aware the risks of tunneled dialysis catheter placement include but are not limited to: bleeding, infection, central venous injury, pneumothorax, possible venous stenosis, possible malpositioning in the venous system, and possible infections related to long-term catheter presence.  The patient was aware of these risks and agreed to proceed.  DESCRIPTION: After written full informed consent was obtained from the patient, the patient was taken back to the operating room.  Prior to induction, the patient was given IV antibiotics.  After obtaining adequate sedation, the patient was prepped and draped in the standard fashion for a chest or neck tunneled dialysis catheter placement.  I cannulated the right internal jugular vein vein under ultrasound guidance.  The J-wire, however, would not pass centrally.  I pulled out the wire and needle and held pressure.  I then repeated this again.  Again the wire failed to pass centrally.  I could feel and see the wire buckling in  the proximal neck, suggest a stenosis or occlusion in the proximal internal jugular vein.  I held pressure for three minutes and placed a bandage.  The drapes were taken down and the patient was re-prepped and re-draped in the standard fashion for femoral vein tunneled dialysis catheter placement.  I anesthesized the groin cannulation site with local anesthetic.   Under ultrasound guidance, the right femoral vein was cannulated with the 18 gauge needle.  A J-wire was then placed into the inferior vena cava under fluroscopic guidance.  The wire was then secured in place with a clamp to the drapes.  The cannulation site, the catheter exit site, and tract for the subcutaneous tunnel were then anesthesized with a total of 30 cc of a 1:1 mixture of 0.5% Marcaine without epinepherine and 1% Lidocaine with epinepherine.  I then made stab incisions at the cannulation and exit sites.  I dissected from the exit site to the cannulation site with a metal dissected and dilated the subcutaneous tunnel with a plastic dilator.  The wire was then unclamped and I removed the needle.  The skin tract and venotomy was dilated serially with dilators.  Finally, the dilator-sheath was placed under fluroscopic guidance into the right iliac vein.  The dilator and wire were removed.  A 55 cm Diatek catheter was placed under fluoroscopic guidance into the right atrium.  The sheath was broken and peeled away while holding the catheter cuff at the level of the skin.  The back of the Diatek was connected to the metal dilator and delivered through the subcutaneous tunnel.  The back end of this catheter was transected, revealing the  two lumens of this catheter.  The ports were docked onto these two lumens.  The catheter hub was then screwed into place.  Each port was tested by aspirating and flushing.  No resistance was noted.  Each port was then thoroughly flushed with heparinized saline.  The catheter was secured in placed with two interrupted  stitches of 3-0 Nylon tied to the catheter.  The cannulation incision was closed with a U-stitch of 4-0 Monocryl.  The cannulation and exit incisions were cleaned and sterile bandages applied.  Each port was then loaded with concentrated heparin (1000 Units/mL) at the manufacturer recommended volumes to each port.  Sterile caps were applied to each port.  On completion fluoroscopy, the tips of the catheter were in the right atrium, and there was no evidence of pneumothorax.  COMPLICATIONS: none  CONDITION: stable  Adele Barthel, MD [facility] Office: [phone] Pager: [phone]  06/07/2013, 1:42 PM

## 2013-06-07 NOTE — Progress Notes (Signed)
Lab called a critical blood sugar of 44 from 0600 this am.  Advised lab that upon shift change CBG was 61 gave 25 ml d50 then re-check 69 gave other 25 mg d50.  Pt is to go down for procedure re-checked CBG 101.  Lab stated that they just got the CBG from 0600.  Pt going down for HD cath placement via bed.

## 2013-06-07 NOTE — Progress Notes (Signed)
ANTIBIOTIC CONSULT NOTE - FOLLOW UP  Pharmacy Consult for Vancomycin + Cefepime Indication: R-heel osteomyelitis  Allergies  Allergen Reactions  . Hydralazine Rash  . Hydrochlorothiazide Hives and Rash    Patient Measurements: Height: 5' 6.93" (170 cm) Weight: 132 lb 8 oz (60.102 kg) IBW/kg (Calculated) : 61.44  Vital Signs: Temp: 98.2 F (36.8 C) (05/04 1400) Temp src: Oral (05/04 1008) BP: 94/45 mmHg (05/04 1515) Pulse Rate: 106 (05/04 1515) Intake/Output from previous day: 05/03 0701 - 05/04 0700 In: 240 [P.O.:240] Out: -  Intake/Output from this shift: Total I/O In: 200 [I.V.:200] Out: -   Labs:  Recent Labs  06/05/13 0644 06/06/13 0619 06/07/13 0619  WBC 14.8* 13.6*  --   HGB 11.3* 10.7*  --   PLT 253 237  --   CREATININE 1.66* 2.31* 2.81*   Estimated Creatinine Clearance: 15.7 ml/min (by C-G formula based on Cr of 2.81).  Recent Labs  06/07/13 0619  [facility] 12.4     Microbiology: Recent Results (from the past 720 hour(s))  CULTURE, BLOOD (ROUTINE X 2)     Status: None   Collection Time    05/31/13  6:27 PM      Result Value Ref Range Status   Specimen Description BLOOD LEFT ANTECUBITAL   Final   Special Requests     Final   Value: BOTTLES DRAWN AEROBIC AND ANAEROBIC 6CC  IMMUNE:COMPROMISED   Culture NO GROWTH 5 DAYS   Final   Report Status 06/05/2013 FINAL   Final  CULTURE, BLOOD (ROUTINE X 2)     Status: None   Collection Time    05/31/13  7:13 PM      Result Value Ref Range Status   Specimen Description BLOOD LEFT HAND   Final   Special Requests BOTTLES DRAWN AEROBIC ONLY 4CC  IMMUNE:COMPROMISED   Final   Culture  Setup Time     Final   Value: 06/03/2013 10:10     Performed at [facility]   Culture     Final   Value: STAPHYLOCOCCUS SPECIES (COAGULASE NEGATIVE)     Note: IDENTIFIED AS STAPHYLOCOCCUS LUGDUNENSIS     Note: Gram Stain Report Called to,Read Back By and Verified With: BULLINS MEGAN AT 7510 ON 06/03/13 BY Tyrone Schimke  M Performed at [facility]     Performed at [facility]   Report Status 06/07/2013 FINAL   Final   Organism ID, Bacteria STAPHYLOCOCCUS SPECIES (COAGULASE NEGATIVE)   Final  CLOSTRIDIUM DIFFICILE BY PCR     Status: None   Collection Time    06/02/13  2:30 PM      Result Value Ref Range Status   C difficile by pcr NEGATIVE  NEGATIVE Final  WOUND CULTURE     Status: None   Collection Time    06/05/13  4:04 PM      Result Value Ref Range Status   Specimen Description FOOT   Final   Special Requests Immunocompromised   Final   Gram Stain     Final   Value: NO WBC SEEN     MODERATE SQUAMOUS EPITHELIAL CELLS PRESENT     ABUNDANT GRAM NEGATIVE RODS     MODERATE GRAM POSITIVE COCCI IN PAIRS     IN CLUSTERS   Culture     Final   Value: MULTIPLE ORGANISMS PRESENT, NONE PREDOMINANT     Performed at [facility]   Report Status PENDING   Incomplete  MRSA PCR SCREENING  Status: None   Collection Time    06/06/13  8:14 PM      Result Value Ref Range Status   MRSA by PCR NEGATIVE  NEGATIVE Final   Comment:            The GeneXpert MRSA Assay (FDA     approved for NASAL specimens     only), is one component of a     comprehensive MRSA colonization     surveillance program. It is not     intended to diagnose MRSA     infection nor to guide or     monitor treatment for     MRSA infections.    Anti-infectives   Start     Dose/Rate Route Frequency Ordered Stop   06/04/13 1400  [MAR Hold]  metroNIDAZOLE (FLAGYL) tablet 250 mg     (On MAR Hold since 06/07/13 1159)   250 mg Oral 3 times per day 06/04/13 1031     06/04/13 1200  [MAR Hold]  ceFEPIme (MAXIPIME) 2 g in dextrose 5 % 50 mL IVPB     (On MAR Hold since 06/07/13 1159)   2 g 100 mL/hr over 30 Minutes Intravenous Every M-W-F (Hemodialysis) 06/04/13 1040     06/04/13 1100  metroNIDAZOLE (FLAGYL) IVPB 250 mg  Status:  Discontinued     250 mg 50 mL/hr over 60 Minutes Intravenous Every 8 hours 06/04/13 1007  06/04/13 1031   06/03/13 1400  piperacillin-tazobactam (ZOSYN) IVPB 2.25 g  Status:  Discontinued     2.25 g 100 mL/hr over 30 Minutes Intravenous Every 8 hours 06/03/13 0911 06/04/13 1007   06/03/13 1200  piperacillin-tazobactam (ZOSYN) IVPB 3.375 g  Status:  Discontinued     3.375 g 12.5 mL/hr over 240 Minutes Intravenous Every 8 hours 06/03/13 0908 06/03/13 0910   06/02/13 1200  [MAR Hold]  vancomycin (VANCOCIN) 500 mg in sodium chloride 0.9 % 100 mL IVPB     (On MAR Hold since 06/07/13 1159)   500 mg 100 mL/hr over 60 Minutes Intravenous Every M-W-F (Hemodialysis) 06/01/13 1652     06/01/13 1015  levofloxacin (LEVAQUIN) IVPB 500 mg  Status:  Discontinued     500 mg 100 mL/hr over 60 Minutes Intravenous Every 24 hours 06/01/13 1010 06/01/13 1555   06/01/13 1000  levofloxacin (LEVAQUIN) tablet 500 mg  Status:  Discontinued    Comments:  Pharmacy to dose   500 mg Oral Daily 05/31/13 2234 06/01/13 1009   06/01/13 0400  piperacillin-tazobactam (ZOSYN) IVPB 2.25 g  Status:  Discontinued     2.25 g 100 mL/hr over 30 Minutes Intravenous Every 8 hours 05/31/13 2238 06/03/13 0908   05/31/13 2000  piperacillin-tazobactam (ZOSYN) IVPB 2.25 g     2.25 g 100 mL/hr over 30 Minutes Intravenous  Once 05/31/13 1936 05/31/13 2023   05/31/13 1949  piperacillin-tazobactam (ZOSYN) 3.375 GM/50ML IVPB    Comments:  Gerlene Burdock   : cabinet override      05/31/13 1949 06/01/13 0759   05/31/13 1945  vancomycin (VANCOCIN) IVPB 1000 mg/200 mL premix     1,000 mg 200 mL/hr over 60 Minutes Intravenous  Once 05/31/13 1936 05/31/13 2130      Assessment: 80 YOF who continues on Vancomycin + Cefepime for R-heel osteomyelitis. A Vancomycin random level this morning was slightly low of specified goal range (Vanc random 12.4 mcg/ml, goal of 15-25 mcg/ml).   The patient went for an IR procedure today to de-clot an AVF. Pre-op,  a dose of Vancomycin 500 mg x 1 dose was given. Estimated Vancomycin level post-dose is  ~21.7 mcg/ml and within range. Will f/u plans for hemodialysis later this evening to determine if an additional dose is needed.  Goal of Therapy:  Pre-HD Vancomycin level of 15-25 mcg/ml  Plan:  1. Vancomycin 500 mg x 1 (already given pre-IR procedure today) 2. Continue Vancomycin 500 mg post HD sessions on MWF (will need dose today if goes to HD) 3. Continue Cefepime 2g IV post HD sessions on MWF (will need dose today if goes to HD) 4. Will continue to follow HD schedule/duration, culture results, LOT, and antibiotic de-escalation plans   Alycia Rossetti, PharmD, BCPS Clinical Pharmacist Pager: [phone] 06/07/2013 3:52 PM

## 2013-06-07 NOTE — Progress Notes (Signed)
Speech recommends to keep pt NPO until re-evaluated tomorrow.  Pt holding medication, food, drink in mouth.  Have to prompt pt many times to get to swallow.

## 2013-06-07 NOTE — Progress Notes (Signed)
eLink Physician-Brief Progress Note Patient Name: Katrina Meyer DOB: July 22, 1932 MRN: [id number]  Date of Service  06/07/2013   HPI/Events of Note  Not able to take PO meds  eICU Interventions  Hold all po meds Hold flagyl, note cdiff neg x 2  ?d/c flagyl   Intervention Category Intermediate Interventions: Medication change / dose adjustment  Elsie Stain 06/07/2013, 9:40 PM

## 2013-06-07 NOTE — Transfer of Care (Signed)
Immediate Anesthesia Transfer of Care Note  Patient: Katrina Meyer  Procedure(s) Performed: Procedure(s): INSERTION OF DIALYSIS CATHETER (Right)  Patient Location: PACU  Anesthesia Type:MAC  Level of Consciousness: awake, alert  and patient cooperative  Airway & Oxygen Therapy: Patient Spontanous Breathing and Patient connected to nasal cannula oxygen  Post-op Assessment: Report given to PACU RN  Post vital signs: Reviewed and unstable  Complications: BP low, Dr Glennon Mac at patient bedside.

## 2013-06-07 NOTE — H&P (View-Only) (Signed)
Referred by:  [facility]  Reason for referral: Clotted right upper arm arteriovenous graft   History of Present Illness  Katrina Meyer is a 80 y.o. (1932/12/26) female who presents for evaluation of clotted right upper arm arteriovenous graft.  The patient has been lost to follow up for last year.  Since her prior visit, she has developed inability to speak with continued alerted mental status. History is obtained from the chart.  The patient was seen by Dr. Kellie Simmering at that time for bilateral foot ulcers.  He had recommended AKA at that point.  Since then the patient has had a L AKA and recurrently was admitted to [facility] in [place], [place] with both Right hand and foot ischemia.  She had undergone debridement and amputations in both limbs by an outside practice.  She was recently admitted to [facility] with altered mental status.  Recently she was found to have no thrill in her R arm access.  Consult was to evaluate the thrombosed access.    Past Medical History  Diagnosis Date  . Hyperlipidemia     takes Lipitor daily  . DVT (deep venous thrombosis)   . Stroke   . Parkinson's disease     takes Sinemet daily  . Tibia/fibula fracture 01/04/2012    Treated with long leg casting. Cast change in hospital 02/27/12   . Peripheral vascular disease 02/27/2012    Skin ulcer of right great toe  . Atrial fibrillation 03/09/2012    Seen on older EKG as well but family not aware.   . Macrocytic anemia 03/09/2012  . Lower GI bleed 03/10/2012    hematochezia secondary to ulcerated polyps; diverticulosis; chronic diarrhea  . End stage renal disease on dialysis     [facility]- MWF  . Hypertension     takes Coreg daily  . Dementia     takes Aricept daily  . Diabetes mellitus, type II     takes Lantus and Novolog  . Seasonal allergies     takes Allegra daily  . Depression     takes Prozac daily  . Hypothyroidism     takes Synthroid daily  . Osteomyelitis of right foot  06/03/2013  . PAD (peripheral artery disease) 05/2013    [place] ischemia; s/p revasc; s/p I&D of deep abscess and 3rd ray amputation  . PAD (peripheral artery disease) 05/2013    [place] I&D right ulnar wound-grew serratia  . Dry gangrene     Right foot    Past Surgical History  Procedure Laterality Date  . Dg av dialysis graft declot or    . Av fistula repair  Aug. 2013    Left arm  . Cholecystectomy    . Colon surgery      Aprrox 2006 at [facility] after she had a colonoscopy  . Colonoscopy  03/10/2012    Procedure: COLONOSCOPY;  Surgeon: Rogene Houston, MD;  [facility];  Service: Endoscopy;  Laterality: N/A;  . Esophagogastroduodenoscopy  03/10/2012    Procedure: ESOPHAGOGASTRODUODENOSCOPY (EGD);  Surgeon: Rogene Houston, MD;  Location: [facility];  Service: Endoscopy;  Laterality: N/A;  EGD if TCS normal.  . Ligation of arteriovenous  fistula Left 03/24/2012    Procedure: LIGATION OF ARTERIOVENOUS  FISTULA;  Surgeon: Conrad [place], MD;  Location: [place];  Service: Vascular;  Laterality: Left;  . Av fistula placement Right 04/28/2012    Procedure: INSERTION OF ARTERIOVENOUS (AV)  GORE-TEX GRAFT ARM;  Surgeon: Elam Dutch, MD;  Location: [facility] OR;  Service: Vascular;  Laterality: Right;  . Leg amputation above knee Left   . Angioplast of lle  12/2012-01/2013    Several Procedures  . Esophagogastroduodenoscopy N/A 02/14/2013    Procedure: ESOPHAGOGASTRODUODENOSCOPY (EGD);  Surgeon: Milus Banister, MD;  Location: [place];  Service: Endoscopy;  Laterality: N/A;  . Cardiac catheterization  03/08/2004    Normal coronary arteries and normal LV function. Normal aortoiliac anatomy suitable for renal transplant.  . Transthoracic echocardiogram  02/07/2004    EF 60%, no evidence of significant valvular abnormalities.  . Finger amputation Right     middle  . Angioplasty rll  12/2012, 2015    at [facility]  . Angioplasty r hand  12/2012, 2015    at [facility]     History   Social History  . Marital Status: Married    Spouse Name: N/A    Number of Children: N/A  . Years of Education: N/A   Occupational History  . Not on file.   Social History Main Topics  . Smoking status: Passive Smoke Exposure - Never Smoker    Types: Cigarettes  . Smokeless tobacco: Never Used  . Alcohol Use: No  . Drug Use: No  . Sexual Activity: No   Other Topics Concern  . Not on file   Social History Narrative  . No narrative on file    Family History  Problem Relation Age of Onset  . Stroke Mother   . Diabetes Mother   . Cancer Mother   . Hypertension Mother   . Coronary artery disease Father   . Diabetes Sister    No current facility-administered medications on file prior to encounter.   Current Outpatient Prescriptions on File Prior to Encounter  Medication Sig Dispense Refill  . atorvastatin (LIPITOR) 10 MG tablet Take 10 mg by mouth every morning.       . carbidopa-levodopa (SINEMET IR) 25-100 MG per tablet Take 1.5 tablets by mouth 3 (three) times daily.      . cinacalcet (SENSIPAR) 30 MG tablet Take 30 mg by mouth every evening.       . donepezil (ARICEPT) 10 MG tablet Take 10 mg by mouth at bedtime.       Marland Kitchen FLUoxetine (PROZAC) 40 MG capsule Take 40 mg by mouth every morning.       . insulin glargine (LANTUS) 100 UNIT/ML injection Inject 1-5 Units into the skin at bedtime as needed. Per Sliding Scale      . levothyroxine (SYNTHROID, LEVOTHROID) 25 MCG tablet Take 25 mcg by mouth every morning.      . Maltodextrin-Xanthan Gum (RESOURCE THICKENUP CLEAR) POWD To thicken liquids to nectar thick  1 Can  2  . multivitamin (RENA-VIT) TABS tablet Take 1 tablet by mouth 2 (two) times daily.       . Nutritional Supplements (FEEDING SUPPLEMENT, NEPRO CARB STEADY,) LIQD Take 237 mLs by mouth daily.  1 Can  0    Allergies  Allergen Reactions  . Hydralazine Rash  . Hydrochlorothiazide Hives and Rash    REVIEW OF SYSTEMS:  (Positives checked  otherwise negative)  Unable to obtain due to altered status  Physical Examination  Filed Vitals:   06/06/13 2146 06/07/13 0450 06/07/13 0500 06/07/13 1008  BP: 97/55 120/71  101/58  Pulse: 95 127  122  Temp: 97.8 F (36.6 C) 98.6 F (37 C)  99.4 F (37.4 C)  TempSrc: Axillary  Axillary  Oral  Resp: 19 18  18   Height: 5' 6.93" (1.7 m)     Weight: 132 lb 9.6 oz (60.147 kg)  132 lb 8 oz (60.102 kg)   SpO2: 98% 96%  95%   Body mass index is 20.8 kg/(m^2).  General: Alert, intermittently tracks, does not speak, elderly, ill appearing  Head: [place]/AT  Ear/Nose/Throat: Hearing cannot be tested , nares w/o erythema or drainage, oropharynx can't be evaluated due to inability to cooperate, Mallampati score: 3  Eyes: PERRL, moves eyes spontaneously suggesting EOMI  Neck: Supple, no nuchal rigidity, no palpable LAD  Pulmonary: Sym exp, good air movt, CTAB, no rales, rhonchi, & wheezing  Cardiac: irr, irr,  no Murmurs, rubs or gallops  Vascular: Vessel Right Left  Radial Not Palpable Faintly palpable  Ulnar Not palpable Not Palpable  Brachial Palpable Faintly Palpable  Carotid Palpable, without bruit Palpable, without bruit  Aorta Not palpable N/A  Femoral Palpable Palpable  Popliteal Palpable AKA  PT Bandaged AKA  DP Bandaged AKA   Gastrointestinal: soft, NTND, -G/R, - HSM, - masses, - CVAT B  Musculoskeletal:moves arms spontaneously, L AKA well healed and warm, R leg warm down to calf, R foot bandaged (not examined per family's wish), R hand bandaged (not examined per family's wish)  Neurologic: cannot be examined due to altered mental status  Psychiatric: cannot be examined due to altered mental status  Dermatologic: See M/S exam for extremity exam, no rashes otherwise noted  Lymph : No Cervical, Axillary, or Inguinal lymphadenopathy   Laboratory: CBC:    Component Value Date/Time   WBC 13.6* 06/06/2013 0619   RBC 3.49* 06/06/2013 0619   RBC 3.47* 03/09/2012 2004   HGB  10.7* 06/06/2013 0619   HCT 32.6* 06/06/2013 0619   PLT 237 06/06/2013 0619   MCV 93.4 06/06/2013 0619   MCH 30.7 06/06/2013 0619   MCHC 32.8 06/06/2013 0619   RDW 17.2* 06/06/2013 0619   LYMPHSABS 1.0 05/31/2013 1827   MONOABS 2.8* 05/31/2013 1827   EOSABS 0.0 05/31/2013 1827   BASOSABS 0.0 05/31/2013 1827    BMP:    Component Value Date/Time   NA 145 06/06/2013 0619   K 3.6* 06/06/2013 0619   CL 107 06/06/2013 0619   CO2 26 06/06/2013 0619   GLUCOSE 154* 06/06/2013 0619   BUN 9 06/06/2013 0619   CREATININE 2.31* 06/06/2013 0619   CALCIUM 9.3 06/06/2013 0619   GFRNONAA 19* 06/06/2013 0619   GFRAA 22* 06/06/2013 0619    Coagulation: Lab Results  Component Value Date   INR 1.27 05/31/2013   INR 1.12 03/05/2013   INR 1.27 03/04/2013   No results found for this basename: PTT    Radiology Head CT (05/31/13) 1. No acute intracranial process.  2. Stable atrophy with moderate chronic microvascular ischemic disease involving the supratentorial white matter.  3. Remote left basal ganglia infarct.  R heel MRI (06/03/13) 1. Skin ulceration over the lateral aspect of the right heel with underlying marrow signal abnormality in the calcaneus compatible with osteomyelitis.  2. Negative for abscess.   Medical Decision Making  THEADA POEPPELMAN is a 80 y.o. 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   This patient's care was deferred to another practice at the family's wish as they did not agree with an opinion obtained from [facility].  I would defer further management of the  right hand and right leg to the patient's current vascular surgeon.  Given the right hand ischemia described in other physicians' notes, I do not recommend a thrombectomy on the right upper arm arteriovenous graft, as that will worsen any perfusion problems in the right arm.    I discussed with  the family proceeding with a tunneled dialysis catheter placement to allow continuing hemodialysis. [name] family is aware the risks of tunneled dialysis catheter placement include but are not limited to: bleeding, infection, central venous injury, pneumothorax, possible venous stenosis, possible malpositioning in the venous system, and possible infections related to long-term catheter presence.  [name] family was aware of these risks and agreed to proceed. I may have to place the tunneled dialysis catheter in the groin given the history of left innominate vein occlusion and difficulty passing a wire via a right internal jugular vein approach.   In this patient with multiple high grade co-morbidities and altered mental status, I recommend considering a palliative approach, given the likelihood of multiple future surgical procedures that will be needed.  Adele Barthel, MD [facility] Office: [phone] Pager: [phone]  06/07/2013, 10:43 AM

## 2013-06-07 NOTE — Anesthesia Postprocedure Evaluation (Signed)
  Anesthesia Post-op Note  Patient: Katrina Meyer  Procedure(s) Performed: Procedure(s): INSERTION OF DIALYSIS CATHETER (Right)  Patient Location: PACU  Anesthesia Type:MAC  Level of Consciousness: awake, alert , patient cooperative and confused  Airway and Oxygen Therapy: Patient Spontanous Breathing and Patient connected to nasal cannula oxygen  Post-op Pain: none  Post-op Assessment: Post-op Vital signs reviewed, Respiratory Function Stable, Patent Airway, No signs of Nausea or vomiting and Pain level controlled, BP still requires pressor support  Post-op Vital Signs: Reviewed and stable on pressor support  Last Vitals:  Filed Vitals:   06/07/13 1615  BP: 89/39  Pulse:   Temp:   Resp: 15    Complications: No apparent anesthesia complications

## 2013-06-07 NOTE — Progress Notes (Signed)
Pt NPO for possible surgery.  BS 60/69/80. D50  Given effective.

## 2013-06-07 NOTE — Interval H&P Note (Signed)
[facility]  History and Physical Update  The patient was interviewed and re-examined.  [name] previous History and Physical has been reviewed and is unchanged from my consult.  There is no change in the plan of care: tunneled dialysis catheter placement.  Adele Barthel, MD [facility] Office: [phone] Pager: [phone]  06/07/2013, 12:36 PM

## 2013-06-07 NOTE — Consult Note (Signed)
PULMONARY / CRITICAL CARE MEDICINE   Name: Katrina Meyer MRN: [id number] DOB: 1933/02/01    ADMISSION DATE:  05/31/2013 CONSULTATION DATE:  06/07/13 REFERRING MD :  Dr. Bridgett Larsson  PRIMARY SERVICE: [facility] --> PCCM (5/4)  CHIEF COMPLAINT:  Hypotension  BRIEF PATIENT DESCRIPTION: 80 y/o F with PMH of ESRD admitted 4/27 with AMS/Hypotension.  AMS thought related to progressive Parkinson's Disease + component of vascular dementia.  Recent admit 4/9-4/25 at [facility] for R heel osteomyelitis.   PRIOR Hospital Hx:  4/9-4/25 - Admit to [facility] for [place], PAD with critical limb ischemia right upper extremity/right lower extremity. During that time, she underwent:   -Status post I&D of deep abscess and third right ray amputation (05/18/13) status post repeat I&D and wound closure of right hand by Dr. Stann Mainland, 05/24/13.  -Status post proximalization/revision of AVG, ligation of distal AVG, endarterectomy right brachial artery 05/17/13 by Dr. Maudie Mercury.  -Status post PTA right ulnar 90% to 20% residual with steal phenomena and threatened hand. Wound cultures grew Serratia.  -Status post right lower extremity arteriogram 05/25/13, status post mechanical thrombectomy; PTA/stent 80% AT artery to 30% et Ronney Asters. Placement of lytic catheter during procedure with administration of lytics overnight to improve flow. DES was placed at the proximal edge of the AT stent.  -Followup with vascular surgery 06/09/13 at 10:15 a.m. with Dr. Maudie Mercury .................................................................................................................................................................................................................  SIGNIFICANT EVENTS: 4/27 - Admit to [facility] with AMS / Hypotension per [facility].  5/03 - Tx to [facility] for evaluation by Dr. Oneida Alar and Dr. [name] 5/04 - L Fem Tunneled HD placement with hypotension post-op   STUDIES:  4/28 - ECHO >> EF greater 70%, moderate LVH, unable to determine  diastolic dysfunction, mild AS 5/01 - EEG >> moderate generalized slowing, no epileptiform activity observed   LINES / TUBES: R FEM Tunneled HD 5/4 >>  CULTURES: C-Diff 4/29 >> neg BCx2 4/27 >> 1/2 with coag neg staph>>pan sens R Foot Culture 5/2 >> multiple organisms >>  ANTIBIOTICS: Zosyn 4/27>>5/1 Flagyl 5/1 >>  Vanco (MWF) 4/27 >> Cefepime (MWF) 5/1>>  HISTORY OF PRESENT ILLNESS:  80 y/o F with complex medical history who was initially admitted to [facility] on 4/27 with AMS/Hypotension.  AMS thought related to progressive Parkinson's Disease + component of vascular dementia.  She recently had a prolonged admit 4/9-4/25 at [facility] for R heel osteomyelitis where she underwent the above mentioned procedures.  Her medical history is extensive including: HTN, HLD, CAD/PAD with critical RLE ischemia / dry gangrene of foot, chronic Afib, CVA, Parkinson's Disease, Anemia, prior GIB, Dementia, DM, and ESRD on HD MWF.    Hospital course at [facility] was notable for ongoing altered mental status and she was evaluated by Neurology.  Per notes patient continued to be semi-responsive, good eye contact and minimal follow commands but moves exts.  EEG was completed to rule out seizure activity and was negative. Patient was noted to have melanotic stoods without significant drop in Hgb.  She also had ongoing hypotension, mild elevation of troponin 0.52, Afib with RVR and blood cultures with 1/2 coag neg staph  She was transferred to [facility] for evaluation by Dr. Oneida Alar and ongoing HD.   PAST MEDICAL HISTORY :  Past Medical History  Diagnosis Date  . Hyperlipidemia     takes Lipitor daily  . DVT (deep venous thrombosis)   . Stroke   . Parkinson's disease     takes Sinemet daily  . Tibia/fibula fracture 01/04/2012  Treated with long leg casting. Cast change in hospital 02/27/12   . Peripheral vascular disease 02/27/2012    Skin ulcer of right great toe  . Atrial fibrillation 03/09/2012    Seen on older EKG  as well but family not aware.   . Macrocytic anemia 03/09/2012  . Lower GI bleed 03/10/2012    hematochezia secondary to ulcerated polyps; diverticulosis; chronic diarrhea  . End stage renal disease on dialysis     [facility]- MWF  . Hypertension     takes Coreg daily  . Dementia     takes Aricept daily  . Diabetes mellitus, type II     takes Lantus and Novolog  . Seasonal allergies     takes Allegra daily  . Depression     takes Prozac daily  . Hypothyroidism     takes Synthroid daily  . Osteomyelitis of right foot 06/03/2013  . PAD (peripheral artery disease) 05/2013    [place] ischemia; s/p revasc; s/p I&D of deep abscess and 3rd ray amputation  . PAD (peripheral artery disease) 05/2013    [place] I&D right ulnar wound-grew serratia  . Dry gangrene     Right foot   Past Surgical History  Procedure Laterality Date  . Dg av dialysis graft declot or    . Av fistula repair  Aug. 2013    Left arm  . Cholecystectomy    . Colon surgery      Aprrox 2006 at [facility] after she had a colonoscopy  . Colonoscopy  03/10/2012    Procedure: COLONOSCOPY;  Surgeon: Rogene Houston, MD;  [facility];  Service: Endoscopy;  Laterality: N/A;  . Esophagogastroduodenoscopy  03/10/2012    Procedure: ESOPHAGOGASTRODUODENOSCOPY (EGD);  Surgeon: Rogene Houston, MD;  Location: [facility] ENDO SUITE;  Service: Endoscopy;  Laterality: N/A;  EGD if TCS normal.  . Ligation of arteriovenous  fistula Left 03/24/2012    Procedure: LIGATION OF ARTERIOVENOUS  FISTULA;  Surgeon: Conrad Naselle, MD;  Location: [place];  Service: Vascular;  Laterality: Left;  . Av fistula placement Right 04/28/2012    Procedure: INSERTION OF ARTERIOVENOUS (AV) GORE-TEX GRAFT ARM;  Surgeon: Elam Dutch, MD;  Location: [facility] OR;  Service: Vascular;  Laterality: Right;  . Leg amputation above knee Left   . Angioplast of lle  12/2012-01/2013    Several Procedures  . Esophagogastroduodenoscopy N/A  02/14/2013    Procedure: ESOPHAGOGASTRODUODENOSCOPY (EGD);  Surgeon: Milus Banister, MD;  Location: [place];  Service: Endoscopy;  Laterality: N/A;  . Cardiac catheterization  03/08/2004    Normal coronary arteries and normal LV function. Normal aortoiliac anatomy suitable for renal transplant.  . Transthoracic echocardiogram  02/07/2004    EF 60%, no evidence of significant valvular abnormalities.  . Finger amputation Right     middle  . Angioplasty rll  12/2012, 2015    at [facility]  . Angioplasty r hand  12/2012, 2015    at [facility]   Prior to Admission medications   Medication Sig Start Date End Date Taking? Authorizing Provider  atorvastatin (LIPITOR) 10 MG tablet Take 10 mg by mouth every morning.    Yes Historical Provider, MD  carbidopa-levodopa (SINEMET IR) 25-100 MG per tablet Take 1.5 tablets by mouth 3 (three) times daily.   Yes Historical Provider, MD  carvedilol (COREG) 6.25 MG tablet Take 18.75 mg by mouth 2 (two) times daily with a meal. Does not take morning dose on dialysis  days (M-W-F) 02/17/13  Yes Geradine Girt, DO  cinacalcet (SENSIPAR) 30 MG tablet Take 30 mg by mouth every evening.    Yes Historical Provider, MD  clopidogrel (PLAVIX) 75 MG tablet Take 75 mg by mouth daily. 05/24/13  Yes Historical Provider, MD  collagenase (SANTYL) ointment Apply 1 application topically daily. 05/29/13  Yes Historical Provider, MD  donepezil (ARICEPT) 10 MG tablet Take 10 mg by mouth at bedtime.    Yes Historical Provider, MD  FLUoxetine (PROZAC) 40 MG capsule Take 40 mg by mouth every morning.    Yes Historical Provider, MD  Breck Coons AND STRETCH AERO Apply 1 application topically daily. Uses at Dialysis treatments 05/06/13  Yes Historical Provider, MD  HYDROcodone-acetaminophen (NORCO/VICODIN) 5-325 MG per tablet Take 1 tablet by mouth every 4 (four) hours as needed. For pain 05/29/13  Yes Historical Provider, MD  insulin glargine (LANTUS) 100 UNIT/ML injection Inject 1-5 Units into the skin  at bedtime as needed. Per Sliding Scale   Yes Historical Provider, MD  insulin lispro (HUMALOG) 100 UNIT/ML injection Inject 1-4 Units into the skin 3 (three) times daily with meals. 200-250= 1 unit 251-300= 2 unit 301-350= 3 unit 351-400= 4 unit 05/29/13 05/29/14 Yes Historical Provider, MD  levofloxacin (LEVAQUIN) 500 MG tablet Take 500 mg by mouth daily. 14 day course starting on 05/30/2013 05/30/13 2013/07/02 Yes Historical Provider, MD  levothyroxine (SYNTHROID, LEVOTHROID) 25 MCG tablet Take 25 mcg by mouth every morning.   Yes Historical Provider, MD  Maltodextrin-Xanthan Gum ([place]) POWD To thicken liquids to nectar thick 03/09/13  Yes Geradine Girt, DO  multivitamin (RENA-VIT) TABS tablet Take 1 tablet by mouth 2 (two) times daily.    Yes Historical Provider, MD  Nutritional Supplements (FEEDING SUPPLEMENT, NEPRO CARB STEADY,) LIQD Take 237 mLs by mouth daily. 02/17/13  Yes Geradine Girt, DO  Vitamin D, Ergocalciferol, (DRISDOL) 50000 UNITS CAPS capsule Take 1 capsule by mouth once a week. 03/17/13  Yes Historical Provider, MD   Allergies  Allergen Reactions  . Hydralazine Rash  . Hydrochlorothiazide Hives and Rash    FAMILY HISTORY:  Family History  Problem Relation Age of Onset  . Stroke Mother   . Diabetes Mother   . Cancer Mother   . Hypertension Mother   . Coronary artery disease Father   . Diabetes Sister    SOCIAL HISTORY:  reports that she has been passively smoking Cigarettes.  She has been smoking about 0.00 packs per day. She has never used smokeless tobacco. She reports that she does not drink alcohol or use illicit drugs.  REVIEW OF SYSTEMS:  Unable to complete as pt is mute.  Information obtained from previous medical documentation.   SUBJECTIVE:   VITAL SIGNS: Temp:  [97.8 F (36.6 C)-99.4 F (37.4 C)] 98.2 F (36.8 C) (05/04 1400) Pulse Rate:  [59-127] 106 (05/04 1515) Resp:  [13-19] 16 (05/04 1515) BP: (56-120)/(23-71) 94/45 mmHg (05/04  1515) SpO2:  [90 %-100 %] 100 % (05/04 1500) Weight:  [132 lb 8 oz (60.102 kg)-132 lb 9.6 oz (60.147 kg)] 132 lb 8 oz (60.102 kg) (05/04 0500)  HEMODYNAMICS:    VENTILATOR SETTINGS:    INTAKE / OUTPUT: Intake/Output     05/03 0701 - 05/04 0700 05/04 0701 - 05/05 0700   P.O. 240    I.V. (mL/kg)  200 (3.3)   Total Intake(mL/kg) 240 (4) 200 (3.3)   Net +240 +200          PHYSICAL  EXAMINATION: General:  chronically ill, elderly female in NAD Neuro:  Awake, alert, mute, moves ext's spontaneously  HEENT:  Mm pink/dry, no jvd Cardiovascular:  s1s2 irr irr, ? Soft murmur Lungs:  resp's even/non-labored on [place] O2, lungs bilaterally clear Abdomen:  Round/soft, bsx4 active  Musculoskeletal:  No acute deformities, well healed L AKA Skin:  Warm/dry, R foot dressed  LABS:  CBC  Recent Labs Lab 06/04/13 0557 06/05/13 0644 06/06/13 0619  WBC 17.1* 14.8* 13.6*  HGB 11.2* 11.3* 10.7*  HCT 34.8* 34.4* 32.6*  PLT 266 253 237   Coag's  Recent Labs Lab 05/31/13 1831  INR 1.27   BMET  Recent Labs Lab 06/05/13 0644 06/06/13 0619 06/07/13 0619  NA 145 145 144  K 3.6* 3.6* 4.2  CL 105 107 106  CO2 31 26 23   BUN 6 9 13   CREATININE 1.66* 2.31* 2.81*  GLUCOSE 130* 154* 44*   Electrolytes  Recent Labs Lab 06/03/13 0603  06/05/13 0644 06/06/13 0619 06/07/13 0619  CALCIUM 9.3  < > 8.8 9.3 9.3  MG 1.8  --   --   --   --   PHOS 3.1  --  2.0* 2.4 2.3  < > = values in this interval not displayed. Sepsis Markers  Recent Labs Lab 05/31/13 1827  LATICACIDVEN 4.4*   ABG No results found for this basename: PHART, PCO2ART, PO2ART,  in the last 168 hours Liver Enzymes  Recent Labs Lab 05/31/13 1831 06/03/13 0603 06/06/13 0619 06/07/13 0619  AST 20  --   --   --   ALT 5  --   --   --   ALKPHOS 190*  --   --   --   BILITOT 0.6  --   --   --   ALBUMIN 1.6* 1.2* 1.1* 1.1*   Cardiac Enzymes  Recent Labs Lab 05/31/13 1827 06/01/13 0603 06/03/13 0602   TROPONINI <0.30 0.48* 0.52*   Glucose  Recent Labs Lab 06/06/13 2139 06/07/13 0738 06/07/13 0834 06/07/13 0913 06/07/13 1311 06/07/13 1406  GLUCAP 133* 61* 69* 80 87 89    Imaging Dg Chest Port 1 View  06/07/2013   CLINICAL DATA:  Status post placement of femoral approach dialysis catheter.  EXAM: PORTABLE CHEST - 1 VIEW  COMPARISON:  Single view of the chest 05/31/2013.  FINDINGS: Femoral approach central venous catheter is in place. Tip of the catheter projects at the superior cavoatrial junction. There is cardiomegaly without edema. The aorta is tortuous. No pneumothorax or pleural effusion. Vascular stent right axilla is again seen.  IMPRESSION: Tip of femoral approach venous catheter projects at the superior cavoatrial junction.  Cardiomegaly without edema.   Electronically Signed   By: Inge Rise M.D.   On: 06/07/2013 14:48     ASSESSMENT / PLAN:  VASCULAR A: PAD with Critical Limb Ischemia S/P L AKA R Heel Osteo R Foot Ulcerations (Multiple) R Hand Wound P:  -recommendations per VVS -WOC   CARDIOVASCULAR A:  Hypotension - post op vascular access placement in R Groin in setting of vasculopath R/o hypovolemia, had EF 70% on recent echo, r/o ischemia, r/o rel AI Atrial Fibrillation P:  -Neosynephrine for MAP >60 -NS at 75 x 1 liter then KVO -continue plavix -hold coreg, lipitor -continue midodrine -assess troponin, EKG -assess cortisol  -ecg Trop Bolus x 1 follow response  INFECTIOUS A:   R Heel Osteomyelitis  Multiple Pressure Ulcers Diarrhea R Hand Infection - s/p I&D 4/16 P:   -  continue abx as above -follow cultures as above > 1/2 with coag neg staph BC -wound care per WOC, appreciate input  NEUROLOGIC A:   Acute Encephalopathy Parkinson's Disease Hx CVA / Vascular Dementia Pain - will have to assess non-verbal indicators of pain as pt does not speak P:   -neuro checks, follow for changes in mental status -continue sinemet,  aricept  RENAL A:   ESRD - MWF HD, s/p Tunneled Cath placement 5/4 Hypokalemia P:   -HD per Nephrology  -tunneled cath placement 5/4, R Fem  GASTROINTESTINAL A:   Concern for Dysphagia Severe Protein Calorie Malnutrition  Diarrhea - c-diff neg P:   -has been taking PO's when alert -aspiration precautions, consider NPO -she may need PEG tube given recent hx of poor mental status and concern for dysphagia if family insists on full scope care -continue imodium  -SUP: PPI -nutritional supplement -swallow eval   HEMATOLOGIC A:   Anemia P:  -monitor H/H trend  -tx for Hgb <8 given cardiac hx -DVT: SCD's for prophylaxis   ENDOCRINE A:   Diabetes Mellitus  Hypothyroidism   P:   -SSI  -continue synthroid  PULMONARY A: No acute issues  P:   -pulmonary hygiene -oxygen to support sats > 92%  Noe Gens, NP-C [facility] Pgr: [phone] or [phone]   I have personally obtained a history, examined the patient, evaluated laboratory and imaging results, formulated the assessment and plan and placed orders.  CRITICAL CARE: The patient is critically ill with multiple organ systems failure and requires high complexity decision making for assessment and support, frequent evaluation and titration of therapies, application of advanced monitoring technologies and extensive interpretation of multiple databases. Critical Care Time devoted to patient care services described in this note is 35 minutes.   06/07/2013, 3:28 PM  Lavon Paganini. Titus Mould, MD, [place] Pgr: [facility]

## 2013-06-07 NOTE — Consult Note (Addendum)
Referred by:  [facility]  Reason for referral: Clotted right upper arm arteriovenous graft   History of Present Illness  Katrina Meyer is a 80 y.o. ([birth year]) female who presents for evaluation of clotted right upper arm arteriovenous graft.  The patient has been lost to follow up for last year.  Since her prior visit, she has developed inability to speak with continued alerted mental status. History is obtained from the chart.  The patient was previously seen by Dr. Kellie Simmering for bilateral foot ulcers.  He had recommended AKA at that point.  The family did not agree with such and pursue vascular care with an outside practice.  Since then the patient has had a L AKA and recently was admitted to [facility] in [place], [place] with both Right hand and foot ischemia.  She had undergone debridement and amputations in both limbs by an outside practice.  She was recently admitted to [facility] with altered mental status.  Recently she was found to have no thrill in her R arm access.  Consult was to evaluate the thrombosed access.    Past Medical History  Diagnosis Date  . Hyperlipidemia     takes Lipitor daily  . DVT (deep venous thrombosis)   . Stroke   . Parkinson's disease     takes Sinemet daily  . Tibia/fibula fracture 01/04/2012    Treated with long leg casting. Cast change in hospital 02/27/12   . Peripheral vascular disease 02/27/2012    Skin ulcer of right great toe  . Atrial fibrillation 03/09/2012    Seen on older EKG as well but family not aware.   . Macrocytic anemia 03/09/2012  . Lower GI bleed 03/10/2012    hematochezia secondary to ulcerated polyps; diverticulosis; chronic diarrhea  . End stage renal disease on dialysis     [facility]- MWF  . Hypertension     takes Coreg daily  . Dementia     takes Aricept daily  . Diabetes mellitus, type II     takes Lantus and Novolog  . Seasonal allergies     takes Allegra daily  . Depression     takes Prozac daily    . Hypothyroidism     takes Synthroid daily  . Osteomyelitis of right foot 06/03/2013  . PAD (peripheral artery disease) 05/2013    [facility] ischemia; s/p revasc; s/p I&D of deep abscess and 3rd ray amputation  . PAD (peripheral artery disease) 05/2013    [facility] I&D right ulnar wound-grew serratia  . Dry gangrene     Right foot    Past Surgical History  Procedure Laterality Date  . Dg av dialysis graft declot or    . Av fistula repair  Aug. 2013    Left arm  . Cholecystectomy    . Colon surgery      Aprrox 2006 at [facility] after she had a colonoscopy  . Colonoscopy  03/10/2012    Procedure: COLONOSCOPY;  Surgeon: Rogene Houston, MD;  [facility];  Service: Endoscopy;  Laterality: N/A;  . Esophagogastroduodenoscopy  03/10/2012    Procedure: ESOPHAGOGASTRODUODENOSCOPY (EGD);  Surgeon: Rogene Houston, MD;  Location[facility];  Service: Endoscopy;  Laterality: N/A;  EGD if TCS normal.  . Ligation of arteriovenous  fistula Left 03/24/2012    Procedure: LIGATION OF ARTERIOVENOUS  FISTULA;  Surgeon: Conrad Banks, MD;  Location: [place];  Service: Vascular;  Laterality: Left;  .  Av fistula placement Right 04/28/2012    Procedure: INSERTION OF ARTERIOVENOUS (AV) GORE-TEX GRAFT ARM;  Surgeon: Elam Dutch, MD;  Location: [facility] OR;  Service: Vascular;  Laterality: Right;  . Leg amputation above knee Left   . Angioplast of lle  12/2012-01/2013    Several Procedures  . Esophagogastroduodenoscopy N/A 02/14/2013    Procedure: ESOPHAGOGASTRODUODENOSCOPY (EGD);  Surgeon: Milus Banister, MD;  Location: [place];  Service: Endoscopy;  Laterality: N/A;  . Cardiac catheterization  03/08/2004    Normal coronary arteries and normal LV function. Normal aortoiliac anatomy suitable for renal transplant.  . Transthoracic echocardiogram  02/07/2004    EF 60%, no evidence of significant valvular abnormalities.  . Finger amputation Right     middle  . Angioplasty rll   12/2012, 2015    at [facility]  . Angioplasty r hand  12/2012, 2015    at [facility]    History   Social History  . Marital Status: Married    Spouse Name: N/A    Number of Children: N/A  . Years of Education: N/A   Occupational History  . Not on file.   Social History Main Topics  . Smoking status: Passive Smoke Exposure - Never Smoker    Types: Cigarettes  . Smokeless tobacco: Never Used  . Alcohol Use: No  . Drug Use: No  . Sexual Activity: No   Other Topics Concern  . Not on file   Social History Narrative  . No narrative on file    Family History  Problem Relation Age of Onset  . Stroke Mother   . Diabetes Mother   . Cancer Mother   . Hypertension Mother   . Coronary artery disease Father   . Diabetes Sister    No current facility-administered medications on file prior to encounter.   Current Outpatient Prescriptions on File Prior to Encounter  Medication Sig Dispense Refill  . atorvastatin (LIPITOR) 10 MG tablet Take 10 mg by mouth every morning.       . carbidopa-levodopa (SINEMET IR) 25-100 MG per tablet Take 1.5 tablets by mouth 3 (three) times daily.      . cinacalcet (SENSIPAR) 30 MG tablet Take 30 mg by mouth every evening.       . donepezil (ARICEPT) 10 MG tablet Take 10 mg by mouth at bedtime.       Marland Kitchen FLUoxetine (PROZAC) 40 MG capsule Take 40 mg by mouth every morning.       . insulin glargine (LANTUS) 100 UNIT/ML injection Inject 1-5 Units into the skin at bedtime as needed. Per Sliding Scale      . levothyroxine (SYNTHROID, LEVOTHROID) 25 MCG tablet Take 25 mcg by mouth every morning.      . Maltodextrin-Xanthan Gum (RESOURCE THICKENUP CLEAR) POWD To thicken liquids to nectar thick  1 Can  2  . multivitamin (RENA-VIT) TABS tablet Take 1 tablet by mouth 2 (two) times daily.       . Nutritional Supplements (FEEDING SUPPLEMENT, NEPRO CARB STEADY,) LIQD Take 237 mLs by mouth daily.  1 Can  0    Allergies  Allergen Reactions  . Hydralazine Rash  .  Hydrochlorothiazide Hives and Rash    REVIEW OF SYSTEMS:  (Positives checked otherwise negative)  Unable to obtain due to altered status  Physical Examination  Filed Vitals:   06/06/13 2146 06/07/13 0450 06/07/13 0500 06/07/13 1008  BP: 97/55 120/71  101/58  Pulse: 95 127  122  Temp: 97.8 F (36.6  C) 98.6 F (37 C)  99.4 F (37.4 C)  TempSrc: Axillary Axillary  Oral  Resp: 19 18  18   Height: 5' 6.93" (1.7 m)     Weight: 132 lb 9.6 oz (60.147 kg)  132 lb 8 oz (60.102 kg)   SpO2: 98% 96%  95%   Body mass index is 20.8 kg/(m^2).  General: Alert, intermittently tracks, does not speak, elderly, ill appearing  Head: [place]/AT  Ear/Nose/Throat: Hearing cannot be tested , nares w/o erythema or drainage, oropharynx can't be evaluated due to inability to cooperate, Mallampati score: 3  Eyes: PERRL, moves eyes spontaneously suggesting EOMI  Neck: Supple, no nuchal rigidity, no palpable LAD  Pulmonary: Sym exp, good air movt, CTAB, no rales, rhonchi, & wheezing  Cardiac: irr, irr,  no Murmurs, rubs or gallops  Vascular: Vessel Right Left  Radial Not Palpable Faintly palpable  Ulnar Not palpable Not Palpable  Brachial Palpable Faintly Palpable  Carotid Palpable, without bruit Palpable, without bruit  Aorta Not palpable N/A  Femoral Palpable Palpable  Popliteal Palpable AKA  PT Bandaged AKA  DP Bandaged AKA   Gastrointestinal: soft, NTND, -G/R, - HSM, - masses, - CVAT B  Musculoskeletal: moves arms spontaneously, L AKA well healed and warm, R leg warm down to calf, R foot bandaged (not examined per family's wish), R hand bandaged (not examined per family's wish), no thrill or bruit in R upper arm  Neurologic: cannot be examined due to altered mental status  Psychiatric: cannot be examined due to altered mental status  Dermatologic: See M/S exam for extremity exam, no rashes otherwise noted  Lymph : No Cervical, Axillary, or Inguinal lymphadenopathy   Laboratory: CBC:      Component Value Date/Time   WBC 13.6* 06/06/2013 0619   RBC 3.49* 06/06/2013 0619   RBC 3.47* 03/09/2012 2004   HGB 10.7* 06/06/2013 0619   HCT 32.6* 06/06/2013 0619   PLT 237 06/06/2013 0619   MCV 93.4 06/06/2013 0619   MCH 30.7 06/06/2013 0619   MCHC 32.8 06/06/2013 0619   RDW 17.2* 06/06/2013 0619   LYMPHSABS 1.0 05/31/2013 1827   MONOABS 2.8* 05/31/2013 1827   EOSABS 0.0 05/31/2013 1827   BASOSABS 0.0 05/31/2013 1827    BMP:    Component Value Date/Time   NA 145 06/06/2013 0619   K 3.6* 06/06/2013 0619   CL 107 06/06/2013 0619   CO2 26 06/06/2013 0619   GLUCOSE 154* 06/06/2013 0619   BUN 9 06/06/2013 0619   CREATININE 2.31* 06/06/2013 0619   CALCIUM 9.3 06/06/2013 0619   GFRNONAA 19* 06/06/2013 0619   GFRAA 22* 06/06/2013 0619    Coagulation: Lab Results  Component Value Date   INR 1.27 05/31/2013   INR 1.12 03/05/2013   INR 1.27 03/04/2013   No results found for this basename: PTT    Radiology Head CT (05/31/13) 1. No acute intracranial process.  2. Stable atrophy with moderate chronic microvascular ischemic disease involving the supratentorial white matter.  3. Remote left basal ganglia infarct.  R heel MRI (06/03/13) 1. Skin ulceration over the lateral aspect of the right heel with underlying marrow signal abnormality in the calcaneus compatible with osteomyelitis.  2. Negative for abscess.   Medical Decision Making  JONELLE BANN is a 80 y.o. 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   This patient's care was deferred to another practice  at the family's wish as they did not agree with an opinion obtained from [facility].  I would defer further management of the right hand and right leg to the patient's current vascular surgeon.  Given the right hand ischemia described in other physicians' notes, I do not recommend a thrombectomy of the right  upper arm arteriovenous graft, as that will worsen any perfusion problems in the right arm.    I discussed with the family proceeding with a tunneled dialysis catheter placement to allow continuing hemodialysis. [name] family is aware the risks of tunneled dialysis catheter placement include but are not limited to: bleeding, infection, central venous injury, pneumothorax, possible venous stenosis, possible malpositioning in the venous system, and possible infections related to long-term catheter presence.  [name] family was aware of these risks and agreed to proceed. I may have to place the tunneled dialysis catheter in the groin given the history of left innominate vein occlusion and difficulty passing a wire via a right internal jugular vein approach.   In this patient with multiple high grade co-morbidities and altered mental status, I recommend considering a palliative approach, given the likelihood of multiple future surgical procedures that will be needed.  Adele Barthel, MD [facility] [place] Office: [phone] Pager: [phone]  06/07/2013, 10:43 AM

## 2013-06-07 NOTE — Anesthesia Preprocedure Evaluation (Signed)
Anesthesia Evaluation  Patient identified by MRN, date of birth, ID band Patient confused    Reviewed: Allergy & Precautions, H&P , NPO status , Patient's Chart, lab work & pertinent test results, reviewed documented beta blocker date and time   History of Anesthesia Complications Negative for: history of anesthetic complications  Airway Mallampati: II TM Distance: >3 FB Neck ROM: Full    Dental  (+) Poor Dentition, Missing, Chipped, Dental Advisory Given   Pulmonary neg pulmonary ROS,  breath sounds clear to auscultation  Pulmonary exam normal       Cardiovascular hypertension, Pt. on medications and Pt. on home beta blockers + Peripheral Vascular Disease + dysrhythmias (plavix, coreg) Atrial Fibrillation Rhythm:Irregular Rate:Normal  4/15 ECHO: EF >70%, mild AS   Neuro/Psych dementiaparkinson's TIACVA, Residual Symptoms    GI/Hepatic negative GI ROS, Neg liver ROS,   Endo/Other  diabetes (glu 80), Insulin DependentHypothyroidism   Renal/GU ESRF and DialysisRenal disease (MWF,  K+ 4.2)     Musculoskeletal   Abdominal   Peds  Hematology  (+) Blood dyscrasia (Hb 10.7), anemia ,   Anesthesia Other Findings   Reproductive/Obstetrics                           Anesthesia Physical Anesthesia Plan  ASA: IV  Anesthesia Plan: MAC   Post-op Pain Management:    Induction:   Airway Management Planned: Natural Airway and Simple Face Mask  Additional Equipment:   Intra-op Plan:   Post-operative Plan:   Informed Consent: I have reviewed the patients History and Physical, chart, labs and discussed the procedure including the risks, benefits and alternatives for the proposed anesthesia with the patient or authorized representative who has indicated his/her understanding and acceptance.   Dental advisory given  Plan Discussed with: CRNA and Surgeon  Anesthesia Plan Comments: (Plan routine  monitors, MAC Discussed with granddaughter, with whom pt resides)        Anesthesia Quick Evaluation

## 2013-06-07 NOTE — Progress Notes (Signed)
Pt NPO CHG bath given.  CBG 101.  Consent signed in chart.

## 2013-06-07 NOTE — Progress Notes (Signed)
SLP Cancellation Note  Patient Details Name: FANTA WIMBERLEY MRN: [id number] DOB: [birth year]   Cancelled treatment:       Reason Eval/Treat Not Completed:  (pt npo for possible surgery per note, will follow up next date, please see previous SLP note with recommendation for npo and consideration of alternative means of nutrition due to ongoing dysphagia/poor mental status)   Luanna Salk, [place] [facility] SLP [phone]

## 2013-06-07 NOTE — Progress Notes (Addendum)
NUTRITION FOLLOW UP  DOCUMENTATION CODES  Per approved criteria   -Severe malnutrition in the context of chronic illness    Pt meets criteria for severe MALNUTRITION in the context of chronic illness as evidenced by <75% energy intake x 1 month, 21.9% wt loss x 1 year.  Intervention:   Diet resumption per SLP's discretion. If enteral nutrition desired, recommend Nepro enteral nutrition formula via enteral feeding tube at 20 ml/hr and advance 10 ml/hr every 12 hours to goal rate of 50 ml/hr. This will provies 2160 kcals, 97 grams protein, and 872 ml fluid at goal rate.  Recommend monitoring of magnesium, potassium, and phosphorus daily for at least 3 days and throughout nutrition repletion, MD to replete as needed, as pt is at risk for refeeding syndrome given severe malnutrition. RD to continue to follow nutrition care plan.  Nutrition Dx:   Inadequate oral intake related to decreased responsiveness as evidenced by NPO; ongoing  Goal:   Pt will meet >90% of estimated nutritional needs; goal not met  Monitor:   Diet advancement vs initiation of nutrition support, PO intake, labs, skin assessments, I/O's  Assessment:   Pt admitted to [facility] on 4/27 for acute encephalopathy. PMHx includes ESRD, DM2, PAD, advanced dementia, advanced Parkinson's disease. R heel osteomyelitis was confirmed by MRI during this admission.  SLP BSE on 4/28 - recommending NPO. Repeat visit by SLP on 4/30, noted that RN and family reported concern at that time  2/2 lack of nutrition and hydration since admission. SLP recommended consideration of feeding tube at that time. Per chart review, pt was started on Dysphagia 2 diet by MD on 5/2. Daughter was asking that Nepro Shakes be discontinued because they cause the patient to have diarrhea, pt was changed to [employer] by MD.  Pt developed clotted fistula on 5/3, transferred to [facility] on same day.  NPO for possible procedure today.  RN asking RD if patient is  appropriate for oral nutrition. She is noting that pt is having severe pocketing episodes and is unable to follow commands for swallowing. We reviewed the chart and most recent SLP recommendations together. RD recommended that pt remain NPO until SLP re-evaluates patient. RD discussed plan of care with SLP.  Potassium low at 3.6 --> receiving D5 in NS with KCl at 50 ml/hr Phosphorus now WNL (was 2.0 on 5/2) CBG's: 61 - 80  Height: Ht Readings from Last 1 Encounters:  06/06/13 5' 6.93" (1.7 m)    Weight Status:   Wt Readings from Last 1 Encounters:  06/07/13 132 lb 8 oz (60.102 kg)  Admit wt 118 lb/53.6 kg  SBW: 58.5 kg  Re-estimated needs:  Kcal: 1900-2100 daily Protein: > 87 grams daily Fluid: 1-1.5 L daily  Skin:  Unstageable Pressure Ulcer: Sacrum  Surgical wound right middle finger with some skin necrosis medially and laterally to this incision with sutures  MASD (Moisture Associated Skin Damage) bilateral buttocks related in incontinence of bowel, which is new for this patient per granddaughter.  Arterial/ischemic ulceration: right lateral foot  Arterial/ischemic ulcerations: right great toe, 2nd, and 3rd toes  Unstageable Pressure Ulcer: right heel  Diet Order: NPO   Intake/Output Summary (Last 24 hours) at 06/07/13 1023 Last data filed at 06/06/13 1838  Gross per 24 hour  Intake    240 ml  Output      0 ml  Net    240 ml    Last BM: 5/3   Labs:   Recent Labs Lab  06/03/13 0603 06/04/13 0557 06/05/13 0644 06/06/13 0619  NA 148* 147 145 145  K 2.9* 3.1* 3.6* 3.6*  CL 104 105 105 107  CO2 31 27 31 26   BUN 8 12 6 9   CREATININE 2.04* 2.74* 1.66* 2.31*  CALCIUM 9.3 9.7 8.8 9.3  MG 1.8  --   --   --   PHOS 3.1  --  2.0* 2.4  GLUCOSE 121* 135* 130* 154*    CBG (last 3)   Recent Labs  06/07/13 0738 06/07/13 0834 06/07/13 0913  GLUCAP 61* 69* 80    Scheduled Meds: . acidophilus  1 capsule Oral Daily  . atorvastatin  10 mg Oral q morning - 10a   . carbidopa-levodopa  2 tablet Oral TID  . carvedilol  3.125 mg Oral BID WC  . ceFEPime (MAXIPIME) IV  2 g Intravenous Q M,W,F-HD  . clopidogrel  75 mg Oral Q breakfast  . collagenase   Topical Daily  . donepezil  10 mg Oral QHS  . feeding supplement (RESOURCE BREEZE)  1 Container Oral BID BM  . insulin aspart  0-5 Units Subcutaneous QHS  . insulin aspart  0-9 Units Subcutaneous TID WC  . levothyroxine  125 mcg Oral QAC breakfast  . loperamide  2 mg Oral BID  . metroNIDAZOLE  250 mg Oral 3 times per day  . midodrine  10 mg Oral Q M,W,F-HD  . multivitamin  1 tablet Oral BID  . pantoprazole (PROTONIX) IV  40 mg Intravenous Q24H  . pentafluoroprop-tetrafluoroeth  1 application Topical Daily  . sodium chloride  3 mL Intravenous Q12H  . sodium chloride  3 mL Intravenous Q12H  . vancomycin  500 mg Intravenous Q M,W,F-HD    Continuous Infusions: . dextrose 5 % and 0.9 % NaCl with KCl 20 mEq/L 50 mL/hr at 06/06/13 2117    Inda Coke MS, RD, LDN Inpatient Registered Dietitian Pager: [phone] After-hours pager: [phone]

## 2013-06-07 NOTE — Progress Notes (Addendum)
   Called to see patient post-op TDC placement.  Pt has been hypotensive since the OR.  On exam, no hematoma in right neck or right groin.  pCXR demonstrate appropriate TDC position with no pneumothorax.   - Hypotension not surgical in nature as no evidence of PTX or active bleeding. - Given the multiple high grade co-morbidities in this patient, multiple etiologies are possible: sepsis from multiple wounds, afib vs aflutter - Would transfer her to MICU (24M) under guidance of PCCM for further work-up and stabilization - I have contacted her previous hospitalist, by report the patient had a BP repeatedly in 80s at [facility] but BP reportedly was as low as 60 post-op  Adele Barthel, MD [facility] [place]: [phone] Pager: [phone]  06/07/2013, 3:01 PM  Addendum  Transfer to MICU ordered.  PCCM contacted.  Hospitalist (Dr. Daleen Bo) contacted.  I attempted to contact the spouse (but voicemail was full) and daughter (listed number disconnected).  Adele Barthel, MD [facility] [place] Office: [phone] Pager: [phone]  06/07/2013, 3:21 PM

## 2013-06-08 ENCOUNTER — Encounter (HOSPITAL_COMMUNITY): Payer: Self-pay | Admitting: Vascular Surgery

## 2013-06-08 DIAGNOSIS — I959 Hypotension, unspecified: Secondary | ICD-10-CM

## 2013-06-08 DIAGNOSIS — G9340 Encephalopathy, unspecified: Secondary | ICD-10-CM

## 2013-06-08 LAB — RENAL FUNCTION PANEL
ALBUMIN: 1.3 g/dL — AB (ref 3.5–5.2)
BUN: 14 mg/dL (ref 6–23)
CHLORIDE: 104 meq/L (ref 96–112)
CO2: 22 meq/L (ref 19–32)
Calcium: 9.4 mg/dL (ref 8.4–10.5)
Creatinine, Ser: 2.99 mg/dL — ABNORMAL HIGH (ref 0.50–1.10)
GFR calc non Af Amer: 14 mL/min — ABNORMAL LOW (ref >90)
GFR, EST AFRICAN AMERICAN: 16 mL/min — AB (ref >90)
Glucose, Bld: 280 mg/dL — ABNORMAL HIGH (ref 70–99)
Phosphorus: 2.9 mg/dL (ref 2.3–4.6)
Potassium: 4.9 mEq/L (ref 3.7–5.3)
Sodium: 139 mEq/L (ref 137–147)

## 2013-06-08 LAB — GLUCOSE, CAPILLARY
GLUCOSE-CAPILLARY: 101 mg/dL — AB (ref 70–99)
Glucose-Capillary: 174 mg/dL — ABNORMAL HIGH (ref 70–99)
Glucose-Capillary: 192 mg/dL — ABNORMAL HIGH (ref 70–99)
Glucose-Capillary: 147 mg/dL — ABNORMAL HIGH (ref 70–99)
Glucose-Capillary: 253 mg/dL — ABNORMAL HIGH (ref 70–99)
Glucose-Capillary: 318 mg/dL — ABNORMAL HIGH (ref 70–99)

## 2013-06-08 LAB — WOUND CULTURE: Gram Stain: NONE SEEN

## 2013-06-08 LAB — CORTISOL: CORTISOL PLASMA: 17.3 ug/dL

## 2013-06-08 LAB — TROPONIN I: Troponin I: <0.3 ng/mL (ref <0.30)

## 2013-06-08 MED ORDER — DEXTROSE-NACL 5-0.9 % IV SOLN: INTRAVENOUS | Status: DC

## 2013-06-08 MED ORDER — WHITE PETROLATUM GEL
Status: AC
  Administered 2013-06-08: 0.2
  Filled 2013-06-08: qty 5

## 2013-06-08 MED ORDER — SODIUM CHLORIDE 0.9 % IV SOLN: 250.0000 mL | INTRAVENOUS | Status: DC | PRN

## 2013-06-08 MED ORDER — COLLAGENASE 250 UNIT/GM EX OINT: TOPICAL_OINTMENT | Freq: Every day | CUTANEOUS | Status: DC

## 2013-06-08 NOTE — Progress Notes (Addendum)
[facility]   Subjective  - Resting well this am.   Objective 98/56 101 98.4 F (36.9 C) (Oral) 17 100%  Intake/Output Summary (Last 24 hours) at 06/08/13 0850 Last data filed at 06/08/13 0700  Gross per 24 hour  Intake 1492.08 ml  Output      0 ml  Net 1492.08 ml   Right femoral diatek in place No drainage or active blood loss. Right leg warm to calf area, foot dressing in place.  Assessment/Planning: POD #1 PROCEDURE:  1. Right femoral vein tunneled dialysis catheter placement  2. Right internal jugular vein cannulation under ultrasound guidance  3. Right femoral vein cannulation under ultrasound guidance  Patient has outside vascular following lower extremities and the right hand.    Ulyses Amor 06/08/2013 8:50 AM --  Laboratory Lab Results:  Recent Labs  06/06/13 0619  WBC 13.6*  HGB 10.7*  HCT 32.6*  PLT 237   BMET  Recent Labs  06/07/13 0619 06/08/13 0550  NA 144 139  K 4.2 4.9  CL 106 104  CO2 23 22  GLUCOSE 44* 280*  BUN 13 14  CREATININE 2.81* 2.99*  CALCIUM 9.3 9.4    COAG Lab Results  Component Value Date   INR 1.27 05/31/2013   INR 1.12 03/05/2013   INR 1.27 03/04/2013   No results found for this basename: PTT    Addendum  I have independently interviewed and examined the patient, and I agree with the physician assistant's findings.  No evidence of bleeding at any puncture site from yesterday's procedure.  TDC in good position without any bleeding or hematoma from either site.  Pt's rt arm and leg are under care of outside vascular practice at the family's previous request.  Available as needed.  Adele Barthel, MD [facility] [place] Office: [phone] Pager: [phone]  06/08/2013, 8:54 AM

## 2013-06-08 NOTE — Progress Notes (Signed)
PULMONARY / CRITICAL CARE MEDICINE   Name: Katrina Meyer MRN: [id number] DOB: 28-May-1932    ADMISSION DATE:  05/31/2013 CONSULTATION DATE:  06/07/13 REFERRING MD :  Dr. Bridgett Larsson  PRIMARY SERVICE: [facility] --> PCCM (5/4)  CHIEF COMPLAINT:  Hypotension  BRIEF PATIENT DESCRIPTION: 80 y/o F with PMH of ESRD admitted 4/27 with AMS/Hypotension.  AMS thought related to progressive Parkinson's Disease + component of vascular dementia.  Recent admit 4/9-4/25 at [facility] for R heel osteomyelitis.   PRIOR Hospital Hx:  4/9-4/25 - Admit to [facility] for [place], PAD with critical limb ischemia right upper extremity/right lower extremity. During that time, she underwent:   -Status post I&D of deep abscess and third right ray amputation (05/18/13) status post repeat I&D and wound closure of right hand by Dr. Stann Mainland, 05/24/13.  -Status post proximalization/revision of AVG, ligation of distal AVG, endarterectomy right brachial artery 05/17/13 by Dr. Maudie Mercury.  -Status post PTA right ulnar 90% to 20% residual with steal phenomena and threatened hand. Wound cultures grew Serratia.  -Status post right lower extremity arteriogram 05/25/13, status post mechanical thrombectomy; PTA/stent 80% AT artery to 30% et Ronney Asters. Placement of lytic catheter during procedure with administration of lytics overnight to improve flow. DES was placed at the proximal edge of the AT stent.  -Followup with vascular surgery 06/09/13 at 10:15 a.m. with Dr. Maudie Mercury .................................................................................................................................................................................................................  SIGNIFICANT EVENTS: 4/27 - Admit to [facility] with AMS / Hypotension per [facility].  5/03 - Tx to [facility] for evaluation by Dr. Oneida Alar and Dr. [name] 5/04 - L Fem Tunneled HD placement with hypotension post-op   STUDIES:  4/28 - ECHO >> EF greater 70%, moderate LVH, unable to determine  diastolic dysfunction, mild AS 5/01 - EEG >> moderate generalized slowing, no epileptiform activity observed   LINES / TUBES: R FEM Tunneled HD 5/4 >>  CULTURES: C-Diff 4/29 >> neg BCx2 4/27 >> 1/2 with coag neg staph>>pan sens R Foot Culture 5/2 >> multiple organisms >>  ANTIBIOTICS: Zosyn 4/27>>5/1 Flagyl 5/1 >>  Vanco (MWF) 4/27 >> Cefepime (MWF) 5/1>>   SUBJECTIVE:  Nonverbal. No distress.   VITAL SIGNS: Temp:  [97.9 F (36.6 C)-98.7 F (37.1 C)] 97.9 F (36.6 C) (05/05 1500) Pulse Rate:  [25-115] 100 (05/05 1500) Resp:  [10-36] 18 (05/05 1500) BP: (49-119)/(26-95) 95/78 mmHg (05/05 1500) SpO2:  [91 %-100 %] 100 % (05/05 1500) Weight:  [59.7 kg (131 lb 9.8 oz)] 59.7 kg (131 lb 9.8 oz) (05/05 0300)  HEMODYNAMICS:    VENTILATOR SETTINGS:    INTAKE / OUTPUT: Intake/Output     05/04 0701 - 05/05 0700 05/05 0701 - 05/06 0700   P.O.     I.V. (mL/kg) 1523.4 (25.5) 458.2 (7.7)   Total Intake(mL/kg) 1523.4 (25.5) 458.2 (7.7)   Net +1523.4 +458.2          PHYSICAL EXAMINATION: General:  chronically ill, elderly female in NAD Neuro:  Awake, alert, mute, moves ext's spontaneously  HEENT:  Mm pink/dry, no jvd Cardiovascular:  s1s2 irr irr, ? Soft murmur Lungs:  resp's even/non-labored on  O2, lungs bilaterally clear Abdomen:  Round/soft, bsx4 active  Musculoskeletal:  No acute deformities, well healed L AKA Skin:  Warm/dry, R foot dressed  LABS: I have reviewed all of today's lab results. Relevant abnormalities are discussed in the A/P section  CXR: NNF  ASSESSMENT / PLAN:  VASCULAR A: PAD with Critical Limb Ischemia S/P L AKA R Heel Osteo R Foot Ulcerations (Multiple) R Hand Wound  P:  -recommendations per VVS -WOC   CARDIOVASCULAR A:  Hypotension - post op vascular access placement in R Groin in setting of vasculopath R/o hypovolemia, had EF 70% on recent echo, r/o ischemia, r/o rel AI Atrial Fibrillation P:  -Neosynephrine for MAP >60 -NS  at 75 x 1 liter then KVO -continue plavix -hold coreg, lipitor -continue midodrine -assess troponin, EKG -assess cortisol  -ecg Trop Bolus x 1 follow response  INFECTIOUS A:   R Heel Osteomyelitis  Multiple Pressure Ulcers Diarrhea R Hand Infection - s/p I&D 4/16 P:   -continue abx as above -follow cultures as above > 1/2 with coag neg staph BC -wound care per WOC, appreciate input  NEUROLOGIC A:   Acute Encephalopathy Parkinson's Disease Hx CVA / Vascular Dementia Pain - will have to assess non-verbal indicators of pain as pt does not speak P:   -neuro checks, follow for changes in mental status -continue sinemet, aricept  RENAL A:   ESRD - MWF HD, s/p Tunneled Cath placement 5/4 Hypokalemia P:   -HD per Nephrology  -tunneled cath placement 5/4, R Fem  GASTROINTESTINAL A:   Concern for Dysphagia Severe Protein Calorie Malnutrition  Diarrhea - c-diff neg P:   -has been taking PO's when alert -aspiration precautions, consider NPO -she may need PEG tube given recent hx of poor mental status and concern for dysphagia if family insists on full scope care -continue imodium  -SUP: PPI -nutritional supplement -swallow eval   HEMATOLOGIC A:   Anemia P:  -monitor H/H trend  -tx for Hgb <8 given cardiac hx -DVT: SCD's for prophylaxis   ENDOCRINE A:   Diabetes Mellitus  Hypothyroidism   P:   -SSI  -continue synthroid  PULMONARY A: No acute issues  P:   -pulmonary hygiene -oxygen to support sats > 92%   Discussed with daughter and son in law. They understand that she has a multitude of ES problems most of which are not reversible and many of which are marginally manageable. They agree to meet with Palliative Care service to address goals of care. It would be medically inappropriate to put this woman through ACLS or vent support unless we thought there was potential for a good QOL to come of it   Merton Border, MD ; [facility]  [phone].  After 5:30 PM or weekends, call [phone]   06/08/2013, 5:13 PM

## 2013-06-08 NOTE — Consult Note (Addendum)
[facility] Renal Consultation Note    Indication for Consultation:  Management of ESRD/hemodialysis; anemia, hypertension/volume and secondary hyperparathyroidism  HPI: Katrina Meyer is a 80 y.o. female ESRD patient (MWF HD) with past medical history significant for advanced dementia, Parkinsons, and PAD with multiple wounds (followed at [facility]). She was was found unresponsive after dialysis on 4/27 and transported to [facility] for work-up for acute encephalopathy and hypotension. MRI negative for acute stroke and EEG was negative for seizure activity. She was subsequently transferred to [facility] for management of tachycardia, worsening hypotension and evaluation of thrombosed RUE AVG   Chart review Oct 2004-- placement IJ HD catheter, CKD Oct 2011-- Cardiology appt >> referred for CHF. Normal cor by cath '06.  Normal Cardiolite 2009. ESRD on hemodialysis. ECHO EF 30-35%. Of note, per caretaker pt does have significant dementia and is a very poor historian. 11 medications including Namenda, prozac, donepezil, BP meds, etc. DM on oral agents, HTN, hx CVA. Rec rx w BB, ACEi.  May 2012-- arteriogram for nonhealing R foot wounds, PTA/stent to R popliteal artery Feb 2013 -- VVS office visit w 2 dtrs. 3 ulcers on R foot had healed since stenting of R popliteal artery in May 2012. Does not ambulate independently. Lives at home and requires " a lot of care".  Being seen at the wound center Sept 2013-- VVS f/u after popliteal stent procedure for R foot wounds. No new recommendations  Dec 2013-- ESRD HD pt at [facility] with fall and tib-fib fracture. Conservative Rx per Dr Louanne Skye w ortho.  ESRD on HD.  Syst HF, MD, HTN, Parkinsons, severe malnutrition.   Feb 2014-- R great toe ulcer admission. Patent pop artery by Duplex US, Rx w ab ointment adn dry dressing, improved. Oral keflex. Usual HD Feb 2014-- admit for hematochezia. Colonscopy show diverticulosis, prob diverticular bleed. Toe ulcer and  new pressure ulcer R heel, stage I Feb 2014 -- ligation of L radiocephalic / basilic vein transposition. L innominate vein occlusion Mar 2014-- new R upper arm AV graft Jan 2015-- presented ED w poor po intake, ^'d lethargy, dec'd LOC and then syncopal episode. Melanotic stool. Hypotensive in ED, afib with RVR.  Admitted, transfused. EGD w gastritis and duodenitis, rx w PPI.  GIB resolved. Afib rx'd w Coreg, not AC candidate. Parkinsons on Sinemet and Dementia on aricept.  Feb 2015-- increased lethargy at home, hypotension day before at HD.  IN ED BP 60's. No +cultures, not felt to be septic etiology, prob volume depletion.  ESRD on HD. HTN and DM on meds, Parkinson's/Dementia on Sinemet and Aricept.  FTT   Past Medical History  Diagnosis Date  . Hyperlipidemia     takes Lipitor daily  . DVT (deep venous thrombosis)   . Stroke   . Parkinson's disease     takes Sinemet daily  . Tibia/fibula fracture 01/04/2012    Treated with long leg casting. Cast change in hospital 02/27/12   . Peripheral vascular disease 02/27/2012    Skin ulcer of right great toe  . Atrial fibrillation 03/09/2012    Seen on older EKG as well but family not aware.   . Macrocytic anemia 03/09/2012  . Lower GI bleed 03/10/2012    hematochezia secondary to ulcerated polyps; diverticulosis; chronic diarrhea  . End stage renal disease on dialysis     [facility]- MWF  . Hypertension     takes Coreg daily  . Dementia     takes Aricept daily  .  Diabetes mellitus, type II     takes Lantus and Novolog  . Seasonal allergies     takes Allegra daily  . Depression     takes Prozac daily  . Hypothyroidism     takes Synthroid daily  . Osteomyelitis of right foot 06/03/2013  . PAD (peripheral artery disease) 05/2013    [place] ischemia; s/p revasc; s/p I&D of deep abscess and 3rd ray amputation  . PAD (peripheral artery disease) 05/2013    [place] I&D right ulnar wound-grew serratia  . Dry  gangrene     Right foot   Past Surgical History  Procedure Laterality Date  . Dg av dialysis graft declot or    . Av fistula repair  Aug. 2013    Left arm  . Cholecystectomy    . Colon surgery      Aprrox 2006 at [facility] after she had a colonoscopy  . Colonoscopy  03/10/2012    Procedure: COLONOSCOPY;  Surgeon: Rogene Houston, MD;  [facility];  Service: Endoscopy;  Laterality: N/A;  . Esophagogastroduodenoscopy  03/10/2012    Procedure: ESOPHAGOGASTRODUODENOSCOPY (EGD);  Surgeon: Rogene Houston, MD;  Location: [facility];  Service: Endoscopy;  Laterality: N/A;  EGD if TCS normal.  . Ligation of arteriovenous  fistula Left 03/24/2012    Procedure: LIGATION OF ARTERIOVENOUS  FISTULA;  Surgeon: Conrad Minco, MD;  Location: [place];  Service: Vascular;  Laterality: Left;  . Av fistula placement Right 04/28/2012    Procedure: INSERTION OF ARTERIOVENOUS (AV) GORE-TEX GRAFT ARM;  Surgeon: Elam Dutch, MD;  Location: [facility] OR;  Service: Vascular;  Laterality: Right;  . Leg amputation above knee Left   . Angioplast of lle  12/2012-01/2013    Several Procedures  . Esophagogastroduodenoscopy N/A 02/14/2013    Procedure: ESOPHAGOGASTRODUODENOSCOPY (EGD);  Surgeon: Milus Banister, MD;  Location: [place];  Service: Endoscopy;  Laterality: N/A;  . Cardiac catheterization  03/08/2004    Normal coronary arteries and normal LV function. Normal aortoiliac anatomy suitable for renal transplant.  . Transthoracic echocardiogram  02/07/2004    EF 60%, no evidence of significant valvular abnormalities.  . Finger amputation Right     middle  . Angioplasty rll  12/2012, 2015    at [facility]  . Angioplasty r hand  12/2012, 2015    at [facility]  . Insertion of dialysis catheter Right 06/07/2013    Procedure: INSERTION OF DIALYSIS CATHETER;  Surgeon: Conrad Mount Pulaski, MD;  Location: [place];  Service: Vascular;  Laterality: Right;   Family History  Problem Relation Age of Onset  . Stroke Mother   . Diabetes  Mother   . Cancer Mother   . Hypertension Mother   . Coronary artery disease Father   . Diabetes Sister    Social History:  reports that she has been passively smoking Cigarettes.  She has been smoking about 0.00 packs per day. She has never used smokeless tobacco. She reports that she does not drink alcohol or use illicit drugs. Allergies  Allergen Reactions  . Hydralazine Rash  . Hydrochlorothiazide Hives and Rash   Prior to Admission medications   Medication Sig Start Date End Date Taking? Authorizing Provider  atorvastatin (LIPITOR) 10 MG tablet Take 10 mg by mouth every morning.    Yes Historical Provider, MD  carbidopa-levodopa (SINEMET IR) 25-100 MG per tablet Take 1.5 tablets by mouth 3 (three) times daily.   Yes Historical Provider, MD  carvedilol (COREG) 6.25 MG tablet Take 18.75 mg by mouth 2 (two) times daily with a meal. Does not take morning dose on dialysis days (M-W-F) 02/17/13  Yes Geradine Girt, DO  cinacalcet (SENSIPAR) 30 MG tablet Take 30 mg by mouth every evening.    Yes Historical Provider, MD  clopidogrel (PLAVIX) 75 MG tablet Take 75 mg by mouth daily. 05/24/13  Yes Historical Provider, MD  collagenase (SANTYL) ointment Apply 1 application topically daily. 05/29/13  Yes Historical Provider, MD  donepezil (ARICEPT) 10 MG tablet Take 10 mg by mouth at bedtime.    Yes Historical Provider, MD  FLUoxetine (PROZAC) 40 MG capsule Take 40 mg by mouth every morning.    Yes Historical Provider, MD  Breck Coons AND STRETCH AERO Apply 1 application topically daily. Uses at Dialysis treatments 05/06/13  Yes Historical Provider, MD  HYDROcodone-acetaminophen (NORCO/VICODIN) 5-325 MG per tablet Take 1 tablet by mouth every 4 (four) hours as needed. For pain 05/29/13  Yes Historical Provider, MD  insulin glargine (LANTUS) 100 UNIT/ML injection Inject 1-5 Units into the skin at bedtime as needed. Per Sliding Scale   Yes Historical Provider, MD  insulin lispro (HUMALOG) 100 UNIT/ML  injection Inject 1-4 Units into the skin 3 (three) times daily with meals. 200-250= 1 unit 251-300= 2 unit 301-350= 3 unit 351-400= 4 unit 05/29/13 05/29/14 Yes Historical Provider, MD  levofloxacin (LEVAQUIN) 500 MG tablet Take 500 mg by mouth daily. 14 day course starting on 05/30/2013 05/30/13 06/09/2013 Yes Historical Provider, MD  levothyroxine (SYNTHROID, LEVOTHROID) 25 MCG tablet Take 25 mcg by mouth every morning.   Yes Historical Provider, MD  Maltodextrin-Xanthan Gum ([place]) POWD To thicken liquids to nectar thick 03/09/13  Yes Geradine Girt, DO  multivitamin (RENA-VIT) TABS tablet Take 1 tablet by mouth 2 (two) times daily.    Yes Historical Provider, MD  Nutritional Supplements (FEEDING SUPPLEMENT, NEPRO CARB STEADY,) LIQD Take 237 mLs by mouth daily. 02/17/13  Yes Geradine Girt, DO  Vitamin D, Ergocalciferol, (DRISDOL) 50000 UNITS CAPS capsule Take 1 capsule by mouth once a week. 03/17/13  Yes Historical Provider, MD   Current Facility-Administered Medications  Medication Dose Route Frequency Provider Last Rate Last Dose  . 0.9 %  sodium chloride infusion  250 mL Intravenous PRN Wilhelmina Mcardle, MD      . acidophilus (RISAQUAD) capsule 1 capsule  1 capsule Oral Daily Rexene Alberts, MD   1 capsule at 06/08/13 1136  . antiseptic oral rinse (BIOTENE) solution 15 mL  15 mL Mouth Rinse q12n4p Raylene Miyamoto, MD   15 mL at 06/08/13 1600  . carbidopa-levodopa (SINEMET IR) 25-100 MG per tablet immediate release 2 tablet  2 tablet Oral TID Phillips Odor, MD   2 tablet at 06/08/13 1536  . ceFEPIme (MAXIPIME) 2 g in dextrose 5 % 50 mL IVPB  2 g Intravenous Q M,W,F-HD Lavonia Drafts Lilliston, RPH   2 g at 06/04/13 1700  . chlorhexidine (PERIDEX) 0.12 % solution 15 mL  15 mL Mouth Rinse BID Raylene Miyamoto, MD   15 mL at 06/08/13 0829  . clopidogrel (PLAVIX) tablet 75 mg  75 mg Oral Q breakfast Rexene Alberts, MD   75 mg at 06/08/13 1136  . collagenase (SANTYL) ointment    Topical Daily Rexene Alberts, MD      . donepezil (ARICEPT) tablet 10 mg  10 mg Oral QHS Jani Gravel, MD   10 mg at 06/06/13 2119  . feeding  supplement (RESOURCE BREEZE) (RESOURCE BREEZE) liquid 1 Container  1 Container Oral BID BM Rexene Alberts, MD   1 Container at 06/08/13 1400  . heparin injection 1,000 Units  1,000 Units Dialysis PRN Liana Gerold, MD      . heparin injection 1,100 Units  20 Units/kg Dialysis PRN Liana Gerold, MD   1,100 Units at 06/04/13 1354  . insulin aspart (novoLOG) injection 0-5 Units  0-5 Units Subcutaneous QHS Jani Gravel, MD      . insulin aspart (novoLOG) injection 0-9 Units  0-9 Units Subcutaneous TID WC Jani Gravel, MD   5 Units at 06/08/13 1549  . levothyroxine (SYNTHROID, LEVOTHROID) tablet 25 mcg  25 mcg Oral QAC breakfast Kinnie Feil, MD   25 mcg at 06/08/13 1136  . lidocaine (PF) (XYLOCAINE) 1 % injection 5 mL  5 mL Intradermal PRN Manpreet Toya Smothers, MD      . lidocaine-prilocaine (EMLA) cream 1 application  1 application Topical PRN Manpreet Toya Smothers, MD      . loperamide (IMODIUM) 1 MG/5ML solution 2 mg  2 mg Oral BID Rexene Alberts, MD   2 mg at 06/08/13 1000  . metoprolol (LOPRESSOR) injection 5 mg  5 mg Intravenous Q4H PRN Rexene Alberts, MD      . midodrine (PROAMATINE) tablet 10 mg  10 mg Oral Q M,W,F-HD Manpreet Toya Smothers, MD   10 mg at 06/04/13 1400  . multivitamin (RENA-VIT) tablet 1 tablet  1 tablet Oral BID Jani Gravel, MD   1 tablet at 06/08/13 1000  . pentafluoroprop-tetrafluoroeth (GEBAUERS) aerosol 1 application  1 application Topical PRN Manpreet Toya Smothers, MD      . phenylephrine (NEO-SYNEPHRINE) 40 mg in dextrose 5 % 250 mL infusion  30-200 mcg/min Intravenous Continuous Wilhelmina Mcardle, MD 24.4 mL/hr at 06/08/13 1532 65 mcg/min at 06/08/13 1532  . RESOURCE THICKENUP CLEAR   Oral PRN Jani Gravel, MD      . sodium chloride 0.9 % injection 3 mL  3 mL Intravenous Q12H Jani Gravel, MD   3 mL at 06/08/13 1000  . sodium chloride 0.9 % injection  3 mL  3 mL Intravenous Q12H Jani Gravel, MD   3 mL at 06/08/13 1000  . sodium chloride 0.9 % injection 3 mL  3 mL Intravenous PRN Jani Gravel, MD      . vancomycin (VANCOCIN) 500 mg in sodium chloride 0.9 % 100 mL IVPB  500 mg Intravenous Q M,W,F-HD Lavonia Drafts Lilliston, RPH   500 mg at 06/07/13 1315   Labs: Basic Metabolic Panel:  Recent Labs Lab 06/06/13 0619 06/07/13 0619 06/08/13 0550  NA 145 144 139  K 3.6* 4.2 4.9  CL 107 106 104  CO2 26 23 22   GLUCOSE 154* 44* 280*  BUN 9 13 14   CREATININE 2.31* 2.81* 2.99*  CALCIUM 9.3 9.3 9.4  PHOS 2.4 2.3 2.9   Liver Function Tests:  Recent Labs Lab 06/06/13 0619 06/07/13 0619 06/08/13 0550  ALBUMIN 1.1* 1.1* 1.3*   No results found for this basename: LIPASE, AMYLASE,  in the last 168 hours No results found for this basename: AMMONIA,  in the last 168 hours CBC:  Recent Labs Lab 06/02/13 0845 06/03/13 0602 06/04/13 0557 06/05/13 0644 06/06/13 0619  WBC 16.7* 15.3* 17.1* 14.8* 13.6*  HGB 13.1 11.0* 11.2* 11.3* 10.7*  HCT 40.1 33.8* 34.8* 34.4* 32.6*  MCV 92.8 93.4 93.8 93.0 93.4  PLT 247 270 266 253 237   Cardiac Enzymes:  Recent Labs Lab 06/03/13 0602 06/07/13 1920 06/07/13 2317 06/08/13 0550  TROPONINI 0.52* <0.30 <0.30 <0.30   CBG:  Recent Labs Lab 06/07/13 1944 06/07/13 2134 06/08/13 0817 06/08/13 1126 06/08/13 1544  GLUCAP 118* 154* 192* 147* 253*   INR: @labcntip (inr:3)@ [place] Studies: No results found for this basename: IRON, TIBC, TRANSFERRIN, FERRITIN,  in the last 72 hours Studies/Results: Dg Chest Port 1 View  06/07/2013   CLINICAL DATA:  Status post placement of femoral approach dialysis catheter.  EXAM: PORTABLE CHEST - 1 VIEW  COMPARISON:  Single view of the chest 05/31/2013.  FINDINGS: Femoral approach central venous catheter is in place. Tip of the catheter projects at the superior cavoatrial junction. There is cardiomegaly without edema. The aorta is tortuous. No pneumothorax or  pleural effusion. Vascular stent right axilla is again seen.  IMPRESSION: Tip of femoral approach venous catheter projects at the superior cavoatrial junction.  Cardiomegaly without edema.   Electronically Signed   By: Inge Rise M.D.   On: 06/07/2013 14:48    ROS: Pt not responding, unable to obtain  Physical Exam: Filed Vitals:   06/08/13 1200 06/08/13 1300 06/08/13 1400 06/08/13 1500  BP: 99/52 112/49 101/46 95/78  Pulse: 97 95 98 100  Temp:    97.9 F (36.6 C)  TempSrc:    Oral  Resp: 21 29 29 18   Height:      Weight:      SpO2: 100% 100% 100% 100%     General: Well developed, well nourished, in no acute distress. Head: Normocephalic, atraumatic, sclera non-icteric, mucus membranes are moist Neck: Supple. JVD not elevated. Lungs: Clear bilaterally to auscultation without wheezes, rales, or rhonchi. Breathing is unlabored. Heart: irregular, tachy Abdomen: Soft, non-tender, non-distended with normoactive bowel sounds. No rebound/guarding. No obvious abdominal masses. M-S:  Strength and tone appear normal for age. LUE edema Lower extremities: L AKA. R heel necrotic, R foot dressing intact Neuro: Alert opens eyes, does not resond Dialysis Access:Rt Fem [facility]   Dialysis Orders: Center: [facility]  on MWF EDW 55kg HD Bath 3K/2.5Ca  Time 3:30 Heparin 6300. Access R AVG BFR 300 DFR 800    Hectorol 2 mcg IV/HD Aranesp 25 weekly   Units IV/HD  Venofer 50 mg (verify frequency)  Recent labs: Hgb 10.5, Tsat 56%, Phos 5.5, Tsat 56%  Assessment/Plan: 1. Acute Encephalopathy - Mgmt per primary. Multifactorial secondary to hypotension vs infectious process. MRI brain negative for acute stroke. Afebrile. Blood cultures with multiple species but no staph or strep. EEG unremarkable. 2. Hypotension -  SBP 80s-100s on neo 3. PAD/Multiple wounds - S/p L AKA.  RLE wounds secondary to vascular compromise. Rt heel wound growing multiple organism but no Staph or Strep A.  Necrotic tissue s/p amputation  of rt third finger. Unstageable sacral pressure ulcer. ID following. 4. Diarrhea - C. Diff negative. On flagyl 5. Afib/ Elevated Troponin -   6. HD Access -  VVS consulted for thrombosed R AVG. Declot not recommended due to current vascular compromise to RUE.  Failed placement of Rt I-J TDC.Marland Kitchen Now with Rt Fem TDC per Dr Bridgett Larsson on 5/4. 7. ESRD -  MWF-  ?palliative care. Will hold off for now, reasses tomorrow 8. Anemia  - Hgb 11.2, aranesp weekly 9. Metabolic bone disease -  Ca 9.4 (11.6 corrected) Hold Vit D for hypercalcemia and use low Ca bath 10. Malnutrition - Alb 1.3. Poor po intake. 11. DM 12. Afib 13. Cognitive impairment - Secondary to dementia, Parkinsons.  [place] at times. 14. Goals of care - pallitiave care consult pending   Shelle Iron, NP 06/08/2013, 4:27 PM   Pt seen, examined and agree w A/P as above. Pt with dementia, cared for by family at home , on HD for about 10 years.  Pt was at [facility] during most of April with vascular complications and ischemic issues of R hand and foot. She has a L AKA.  Went home then was admitted one week ago at [facility] due to an unresponsive episode that occurred just after outpt HD.  She had AMS and low BP.  Workup for CVA and EEG were negative.  She had access procedure then BP dropped and she was transferred here.  BP here is in the 80's on neo gtt at high doses. She is too unstable for HD and not a candidate for CRRT.  Will follow, HD as tolerated if BP improves. Talked with family and asked them to consider tonight such issues such as focusing on comfort, limiting care, DNR, etc.   Kelly Splinter MD pager [phone]    cell [phone] 06/08/2013, 6:01 PM

## 2013-06-08 NOTE — Progress Notes (Signed)
Speech Language Pathology Treatment: Dysphagia  Patient Details Name: Katrina Meyer MRN: [id number] DOB: 1932/04/25 Today's Date: 06/08/2013 Time: 1002-1040 SLP Time Calculation (min): 38 min  Assessment / Plan / Recommendation Clinical Impression  F/u for dysphagia.  Daughter, Katrina Meyer, present.  Pt was alert, maintained eyes closed.  Accepted POs inconsistently, with oral holding, delays in initiation, and intermittent presence of swallow.  There were fluctuations in behavior over course of session, ranging from necessity of oral suctioning to successful mastication and swallowing of materials.  Pt's daughter asserts that this eating behavior is quite close to baseline.  Katrina Meyer is knowledgeable about her mother's swallowing behaviors and the strategies that facilitate improved and safer intake.  She understands the inherent risk of aspiration and the presence of dysphagia associated with dementia.  Pt does respond better to her daughter's attempts to feed her than she does to staff.  Recommend resuming a dysphagia 2 diet with thin liquids; meds whole with puree.  Allow Katrina Meyer to feed pt her meals.  No further SLP intervention is warranted - will sign off.     HPI HPI: Mrs. Katrina Meyer is a 80 yo female who was admitted to [facility] after becoming unresponsive following HD. She had a similar episode at the end of January 2015 and was admitted to [facility]. She had MBSS completed at that time (twice) and was placed on a mech soft diet with NTL due to silent aspiration of thins. Grandaughter reports that pt was recently discharged from [facility] following a 2-week admission. She had been consuming regular textures and thin liquids at home PTA.       SLP Plan  All goals met    Recommendations Diet recommendations: Dysphagia 2 (fine chop);Thin liquid Liquids provided via: Cup;Teaspoon Medication Administration: Whole meds with puree Supervision: Trained caregiver to feed patient Compensations: Slow  rate;Small sips/bites (alternate ice chips and solids) Postural Changes and/or Swallow Maneuvers: Seated upright 90 degrees              Oral Care Recommendations: Oral care BID Follow up Recommendations: 24 hour supervision/assistance Plan: All goals met  Katrina Meyer L. Tivis Ringer, [place] CCC/SLP Pager [place] 06/08/2013, 10:52 AM

## 2013-06-09 DIAGNOSIS — R6521 Severe sepsis with septic shock: Secondary | ICD-10-CM

## 2013-06-09 DIAGNOSIS — R6510 Systemic inflammatory response syndrome (SIRS) of non-infectious origin without acute organ dysfunction: Secondary | ICD-10-CM

## 2013-06-09 DIAGNOSIS — A419 Sepsis, unspecified organism: Secondary | ICD-10-CM

## 2013-06-09 DIAGNOSIS — R579 Shock, unspecified: Secondary | ICD-10-CM

## 2013-06-09 LAB — RENAL FUNCTION PANEL
Albumin: 1.2 g/dL — ABNORMAL LOW (ref 3.5–5.2)
BUN: 17 mg/dL (ref 6–23)
CO2: 21 meq/L (ref 19–32)
Calcium: 9.8 mg/dL (ref 8.4–10.5)
Chloride: 104 meq/L (ref 96–112)
Creatinine, Ser: 3.31 mg/dL — ABNORMAL HIGH (ref 0.50–1.10)
GFR calc Af Amer: 14 mL/min — ABNORMAL LOW
GFR calc non Af Amer: 12 mL/min — ABNORMAL LOW
Glucose, Bld: 157 mg/dL — ABNORMAL HIGH (ref 70–99)
Phosphorus: 2.9 mg/dL (ref 2.3–4.6)
Potassium: 4 meq/L (ref 3.7–5.3)
Sodium: 137 meq/L (ref 137–147)

## 2013-06-09 LAB — GLUCOSE, CAPILLARY
GLUCOSE-CAPILLARY: 249 mg/dL — AB (ref 70–99)
GLUCOSE-CAPILLARY: 247 mg/dL — AB (ref 70–99)
Glucose-Capillary: 127 mg/dL — ABNORMAL HIGH (ref 70–99)
Glucose-Capillary: 137 mg/dL — ABNORMAL HIGH (ref 70–99)
Glucose-Capillary: 191 mg/dL — ABNORMAL HIGH (ref 70–99)
Glucose-Capillary: 219 mg/dL — ABNORMAL HIGH (ref 70–99)
Glucose-Capillary: 273 mg/dL — ABNORMAL HIGH (ref 70–99)
Glucose-Capillary: 339 mg/dL — ABNORMAL HIGH (ref 70–99)

## 2013-06-09 MED ORDER — DEXTROSE 5 % IV SOLN
500.0000 mg | INTRAVENOUS | Status: DC
  Administered 2013-06-09: 500 mg via INTRAVENOUS
  Filled 2013-06-09 (×3): qty 0.5

## 2013-06-09 NOTE — Progress Notes (Addendum)
ANTIBIOTIC CONSULT NOTE - FOLLOW UP  Pharmacy Consult for Vancomycin + Cefepime Indication: R-heel osteomyelitis  Allergies  Allergen Reactions  . Hydralazine Rash  . Hydrochlorothiazide Hives and Rash    Patient Measurements: Height: 5' 6.93" (170 cm) Weight: 138 lb 3.7 oz (62.7 kg) IBW/kg (Calculated) : 61.44  Vital Signs: Temp: 98.3 F (36.8 C) (05/06 0745) Temp src: Oral (05/06 0745) BP: 74/30 mmHg (05/06 1000) Pulse Rate: 112 (05/06 0830) Intake/Output from previous day: 05/05 0701 - 05/06 0700 In: 961.8 [I.V.:961.8] Out: -  Intake/Output from this shift: Total I/O In: 206.4 [I.V.:206.4] Out: -   Labs:  Recent Labs  06/07/13 0619 06/08/13 0550 06/09/13 0450  CREATININE 2.81* 2.99* 3.31*   Estimated Creatinine Clearance: 13.6 ml/min (by C-G formula based on Cr of 3.31).  Recent Labs  06/07/13 0619  [facility] 12.4     Microbiology: Recent Results (from the past 720 hour(s))  CULTURE, BLOOD (ROUTINE X 2)     Status: None   Collection Time    05/31/13  6:27 PM      Result Value Ref Range Status   Specimen Description BLOOD LEFT ANTECUBITAL   Final   Special Requests     Final   Value: BOTTLES DRAWN AEROBIC AND ANAEROBIC 6CC  IMMUNE:COMPROMISED   Culture NO GROWTH 5 DAYS   Final   Report Status 06/05/2013 FINAL   Final  CULTURE, BLOOD (ROUTINE X 2)     Status: None   Collection Time    05/31/13  7:13 PM      Result Value Ref Range Status   Specimen Description BLOOD LEFT HAND   Final   Special Requests BOTTLES DRAWN AEROBIC ONLY 4CC  IMMUNE:COMPROMISED   Final   Culture  Setup Time     Final   Value: 06/03/2013 10:10     Performed at [facility]   Culture     Final   Value: STAPHYLOCOCCUS SPECIES (COAGULASE NEGATIVE)     Note: IDENTIFIED AS STAPHYLOCOCCUS LUGDUNENSIS     Note: Gram Stain Report Called to,Read Back By and Verified With: BULLINS MEGAN AT 5638 ON 06/03/13 BY Tyrone Schimke M Performed at [facility]     Performed at  [facility]   Report Status 06/07/2013 FINAL   Final   Organism ID, Bacteria STAPHYLOCOCCUS SPECIES (COAGULASE NEGATIVE)   Final  CLOSTRIDIUM DIFFICILE BY PCR     Status: None   Collection Time    06/02/13  2:30 PM      Result Value Ref Range Status   C difficile by pcr NEGATIVE  NEGATIVE Final  WOUND CULTURE     Status: None   Collection Time    06/05/13  4:04 PM      Result Value Ref Range Status   Specimen Description WOUND RIGHT HEEL   Final   Special Requests IMMUNE:COMPROMISED   Final   Gram Stain     Final   Value: NO WBC SEEN     MODERATE SQUAMOUS EPITHELIAL CELLS PRESENT     ABUNDANT GRAM NEGATIVE RODS     MODERATE GRAM POSITIVE COCCI IN PAIRS     IN CLUSTERS   Culture     Final   Value: MULTIPLE ORGANISMS PRESENT, NONE PREDOMINANT     Note: NO STAPHYLOCOCCUS AUREUS ISOLATED NO GROUP A STREP (S.PYOGENES) ISOLATED     Performed at [facility]   Report Status 06/08/2013 FINAL   Final  MRSA PCR SCREENING  Status: None   Collection Time    06/06/13  8:14 PM      Result Value Ref Range Status   MRSA by PCR NEGATIVE  NEGATIVE Final   Comment:            The GeneXpert MRSA Assay (FDA     approved for NASAL specimens     only), is one component of a     comprehensive MRSA colonization     surveillance program. It is not     intended to diagnose MRSA     infection nor to guide or     monitor treatment for     MRSA infections.    Anti-infectives   Start     Dose/Rate Route Frequency Ordered Stop   06/09/13 1200  ceFEPIme (MAXIPIME) 500 mg in dextrose 5 % 50 mL IVPB     500 mg 100 mL/hr over 30 Minutes Intravenous Every 24 hours 06/09/13 1014     06/04/13 1400  metroNIDAZOLE (FLAGYL) tablet 250 mg  Status:  Discontinued     250 mg Oral 3 times per day 06/04/13 1031 06/08/13 1123   06/04/13 1200  ceFEPIme (MAXIPIME) 2 g in dextrose 5 % 50 mL IVPB  Status:  Discontinued     2 g 100 mL/hr over 30 Minutes Intravenous Every M-W-F (Hemodialysis)  06/04/13 1040 06/09/13 1014   06/04/13 1100  metroNIDAZOLE (FLAGYL) IVPB 250 mg  Status:  Discontinued     250 mg 50 mL/hr over 60 Minutes Intravenous Every 8 hours 06/04/13 1007 06/04/13 1031   06/03/13 1400  piperacillin-tazobactam (ZOSYN) IVPB 2.25 g  Status:  Discontinued     2.25 g 100 mL/hr over 30 Minutes Intravenous Every 8 hours 06/03/13 0911 06/04/13 1007   06/03/13 1200  piperacillin-tazobactam (ZOSYN) IVPB 3.375 g  Status:  Discontinued     3.375 g 12.5 mL/hr over 240 Minutes Intravenous Every 8 hours 06/03/13 0908 06/03/13 0910   06/02/13 1200  vancomycin (VANCOCIN) 500 mg in sodium chloride 0.9 % 100 mL IVPB     500 mg 100 mL/hr over 60 Minutes Intravenous Every M-W-F (Hemodialysis) 06/01/13 1652     06/01/13 1015  levofloxacin (LEVAQUIN) IVPB 500 mg  Status:  Discontinued     500 mg 100 mL/hr over 60 Minutes Intravenous Every 24 hours 06/01/13 1010 06/01/13 1555   06/01/13 1000  levofloxacin (LEVAQUIN) tablet 500 mg  Status:  Discontinued    Comments:  Pharmacy to dose   500 mg Oral Daily 05/31/13 2234 06/01/13 1009   06/01/13 0400  piperacillin-tazobactam (ZOSYN) IVPB 2.25 g  Status:  Discontinued     2.25 g 100 mL/hr over 30 Minutes Intravenous Every 8 hours 05/31/13 2238 06/03/13 0908   05/31/13 2000  piperacillin-tazobactam (ZOSYN) IVPB 2.25 g     2.25 g 100 mL/hr over 30 Minutes Intravenous  Once 05/31/13 1936 05/31/13 2023   05/31/13 1949  piperacillin-tazobactam (ZOSYN) 3.375 GM/50ML IVPB    Comments:  Gerlene Burdock   : cabinet override      05/31/13 1949 06/01/13 0759   05/31/13 1945  vancomycin (VANCOCIN) IVPB 1000 mg/200 mL premix     1,000 mg 200 mL/hr over 60 Minutes Intravenous  Once 05/31/13 1936 05/31/13 2130      Assessment: 80 YOF who continues on Vancomycin + Cefepime day # 10 for R-heel osteomyelitis. No HD since 5/1 and currently unable to dialyze per nephrology.   Goal of Therapy:  Pre-HD Vancomycin level of 15-25 mcg/ml  Plan:  1. Adjust  Cefepime to 500mg  IV q24h without HD.  3. No vancomycin today. Will follow-up GOC discussion and consider level if continue therapy.  4. Will continue to follow HD schedule/duration, culture results, LOT, and antibiotic de-escalation plans   Sloan Leiter, PharmD, BCPS Clinical Pharmacist [phone]  06/09/2013 10:28 AM    Addnedum: Patient anuric. Stop cefepime. Await HD.  Sloan Leiter, PharmD, BCPS Clinical Pharmacist [phone] 06/10/2013, 10:08 AM

## 2013-06-09 NOTE — Progress Notes (Signed)
I spoke with Ms. Royse's daughter at bedside who is has planned a tentative meeting for tomorrow ~1-2 pm. I did approach code status as her BP remains extremely low but she tells me she is unable to make any limitations on care until we can sit down with Mr. Haggar and this will be tomorrow afternoon when he is able to meet.   Vinie Sill, NP Palliative Medicine Team Pager # [phone] (M-F 8a-5p) Team Phone # [phone] (Nights/Weekends)

## 2013-06-09 NOTE — Progress Notes (Signed)
PULMONARY / CRITICAL CARE MEDICINE   Name: Katrina Meyer MRN: [id number] DOB: 1932/09/04    ADMISSION DATE:  05/31/2013 CONSULTATION DATE:  06/07/13 REFERRING MD :  Dr. Bridgett Larsson  PRIMARY SERVICE: [facility] --> PCCM (5/4)  CHIEF COMPLAINT:  Hypotension  BRIEF PATIENT DESCRIPTION: 80 y/o F with PMH of ESRD admitted 4/27 with AMS/Hypotension.  AMS thought related to progressive Parkinson's Disease + component of vascular dementia.  Recent admit 4/9-4/25 at [facility] for R heel osteomyelitis. During that hospitalization, she underwent:   -I&D of deep abscess and third right ray amputation (05/18/13) status post repeat I&D and wound closure of right hand, 4/20.  -proximalization/revision of AVG, ligation of distal AVG, endarterectomy right brachial artery 05/17/13 by Dr. Maudie Mercury.  -PTA right ulnar 90% to 20% residual with steal phenomena and threatened hand. Wound cultures grew Serratia.  -right lower extremity arteriogram 05/25/13, status post mechanical thrombectomy; PTA/stent 80% AT artery to 30% et Ronney Asters. -Placement of lytic catheter during procedure with administration of lytics overnight to improve flow. DES was placed at the proximal edge of the AT stent.  .................................................................................................................................................................................................................  SIGNIFICANT EVENTS: 4/27 Admit to [facility] with AMS / Hypotension per [facility].  5/03 Tx to [facility] for evaluation by Dr. Oneida Alar and Dr. [name] 5/04 L Fem Tunneled HD placement with hypotension post-op. Vasopressors initiated.  5/05 Goals of care discussion initiated 5/06 Persistent hypotension prohibits HD. Deemed by Dr Jonnie Finner and Dr Alva Garnet to not be a candidate for CRRT 5/06 Palliative care consult requested. Plan family conference 5/07  STUDIES:  4/27 CT head: Stable atrophy with moderate chronic microvascular ischemic disease involving the  supratentorial white matter. Remote left basal ganglia infarct 4/28 MIR brain: No definite acute intracranial abnormality 4/28 Echocardiogram:  EF greater 70%, moderate LVH, unable to determine diastolic dysfunction, mild AS 4/30 MRI R foot: Skin ulceration over the lateral aspect of the right heel with underlying marrow signal abnormality in the calcaneus compatible with osteomyelitis. Negative for abscess. 5/01 EEG:  moderate generalized slowing, no epileptiform activity observed    LINES / TUBES: R FEM Tunneled HD 5/4 >>  CULTURES: C-Diff 4/29 >> NEG blood 4/27 >> 1/2 coag neg staph R Foot  5/2 >> multiple organisms  ANTIBIOTICS: Zosyn 4/27>>5/1 Flagyl 5/1 >> 5/04 Vanco (MWF) 4/27 >> Cefepime (MWF) 5/1>>   SUBJECTIVE:  Nonverbal. No distress. Persistent hypotension prohibits HD  VITAL SIGNS: Temp:  [97.5 F (36.4 C)-98.7 F (37.1 C)] 97.5 F (36.4 C) (05/06 1100) Pulse Rate:  [44-118] 118 (05/06 1415) Resp:  [14-58] 20 (05/06 1415) BP: (42-131)/(19-98) 55/42 mmHg (05/06 1415) SpO2:  [100 %] 100 % (05/06 1415) Weight:  [62.7 kg (138 lb 3.7 oz)] 62.7 kg (138 lb 3.7 oz) (05/06 0500)  HEMODYNAMICS:    VENTILATOR SETTINGS:    INTAKE / OUTPUT: Intake/Output     05/05 0701 - 05/06 0700 05/06 0701 - 05/07 0700   I.V. (mL/kg) 961.8 (15.3) 511.4 (8.2)   IV Piggyback  150   Total Intake(mL/kg) 961.8 (15.3) 661.4 (10.5)   Net +961.8 +661.4        Stool Occurrence 1 x      PHYSICAL EXAMINATION: General: Decreased LOC, NAD Neuro: paucity of spontaneous movement HEENT: NAD Cardiovascular: IRIR, mildly tachycardic Lungs: clear anteriorly, unlabored  Abdomen:  Soft, NT, +BS Ext; L AKA, cool, no edema, R foot dressed  LABS: I have reviewed all of today's lab results. Relevant abnormalities are discussed in the A/P section  CXR: NNF  ASSESSMENT / PLAN:  VASCULAR A: Severe PAD with Critical Limb Ischemia S/P L AKA R Hand Wound P:  Mgmt per VVS and  WOC  CARDIOVASCULAR A:  Hypotension CAF, rate adequately controlled P:  Cont phenylephrine to goal MAP of 55 mmHg Will not add further vasopressors  INFECTIOUS A:   R Heel Osteomyelitis  R Foot Ulcerations (Multiple) Multiple Pressure Ulcers R Hand Infection - s/p I&D 4/16 P:   Micro and abx as above  NEUROLOGIC A:   Severe baseline dementia Parkinson's Disease Hx CVA  Pain P:   Cont Sinemet and donepezil if able to take POs Cont analgesia PRN  RENAL A:   ESRD Hypokalemia, resolved P:   HD per Renal Not a candidate for CRRT - I concur with this assessment Monitor BMET intermittently Monitor I/Os Correct electrolytes as indicated if possible  GASTROINTESTINAL A:   Concern for Dysphagia Severe Protein Calorie Malnutrition  Diarrhea P:   SUP: N/I PO nutrition as tolerated  HEMATOLOGIC A:   Mild anemia without acute blood loss P:  DVT px: SCDs Monitor CBC intermittently Transfuse per usual ICU guidelines  ENDOCRINE A:   DM 2 Hypothyroidism   P:   Cont CBGs/SSI Cont levothyroxine   PULMONARY A: No acute issues  P:   Supp O2 as needed to maintain SpO2 > 92%   Daughter updated @ bedside. I clarified that pt's comfort must be our first priority. I explained that we have not reasonable means of providing HD due to hypotension. Palliative Care has performed initial assessment and plan meeting for tomorrow afternoon. Given severe and irreversible conditions with no chance of recovering a good QOL by any reasonable person's assessment, ACLS and mech ventilation would not be appropriate here   Merton Border, MD ; [facility] service Mobile [phone].  After 5:30 PM or weekends, call [phone]   06/09/2013, 2:31 PM

## 2013-06-09 NOTE — Progress Notes (Signed)
[facility] Progress Note   Subjective: No changes overnight, remains on pressors, BP's in 70's, pt nonverbal  Filed Vitals:   06/09/13 0730 06/09/13 0745 06/09/13 0800 06/09/13 0805  BP: 76/38 76/52 70/36  79/53  Pulse:      Temp:  98.3 F (36.8 C)    TempSrc:  Oral    Resp: 14 18 14 16   Height:      Weight:      SpO2: 100%      Exam: Frail elderly female, opens eyes, no verbal response, does not follow commands No jvd Chest dec'd at bases Irregular, tachy, no RG Abd soft, NTND, +BS +bilat 2+ edema of UE's, trace LE edema Large eschar R heel, large eschar lateral aspect of R foot with dusky 5th toe; R hand wounds appear clean s/p I&D dorsal abcess and removal 3rd finger Neuro is diffusely weak, not following commands R fem Diatek cath in place  HD: MWF [place] 3.5h   3/2.5 Bath   55kg  Heparin 6300   R thigh Diatek Hect 2   Aranesp 25/wk   Venofer 50/wk tsat 56%  Assessment: 1 AMS - acute on background of chronic dementia 2 Septic shock on pressors 3 Multiple wounds- R hand wound s/p I&D dorsal abcess and removal 3rd finger; also R heel large eschar and R lat foot large eschar w dusky 5th toe 4 PVD L BKA 5 ESRD on HD x 10 yrs approx 6 Diarrhea - Cdif neg 7 Afib / ^trop 8 Access- R AVG clotted, no declot due to vasc compromise of R hand, now has R thigh tunneled cath 9 Anemia on aranesp 10 MBD ^Ca holding D and use low Ca bath 11 Dementia / Parkinson's 12 DM  Plan- remains unstable and not a candidate for routine HD given current condition.  Not a good candidate for CRRT either due to chronic and severe comorbidities. Will follow for signs of improvement. Have d/w primary MD.     Kelly Splinter MD  pager [phone]    cell [phone]  06/09/2013, 9:27 AM     Recent Labs Lab 06/07/13 0619 06/08/13 0550 06/09/13 0450  NA 144 139 137  K 4.2 4.9 4.0  CL 106 104 104  CO2 23 22 21   GLUCOSE 44* 280* 157*  BUN 13 14 17   CREATININE 2.81* 2.99*  3.31*  CALCIUM 9.3 9.4 9.8  PHOS 2.3 2.9 2.9    Recent Labs Lab 06/07/13 0619 06/08/13 0550 06/09/13 0450  ALBUMIN 1.1* 1.3* 1.2*    Recent Labs Lab 06/04/13 0557 06/05/13 0644 06/06/13 0619  WBC 17.1* 14.8* 13.6*  HGB 11.2* 11.3* 10.7*  HCT 34.8* 34.4* 32.6*  MCV 93.8 93.0 93.4  PLT 266 253 237   . acidophilus  1 capsule Oral Daily  . antiseptic oral rinse  15 mL Mouth Rinse q12n4p  . carbidopa-levodopa  2 tablet Oral TID  . ceFEPime (MAXIPIME) IV  2 g Intravenous Q M,W,F-HD  . chlorhexidine  15 mL Mouth Rinse BID  . clopidogrel  75 mg Oral Q breakfast  . collagenase   Topical Daily  . donepezil  10 mg Oral QHS  . feeding supplement (RESOURCE BREEZE)  1 Container Oral BID BM  . insulin aspart  0-5 Units Subcutaneous QHS  . insulin aspart  0-9 Units Subcutaneous TID WC  . levothyroxine  25 mcg Oral QAC breakfast  . loperamide  2 mg Oral BID  . midodrine  10 mg Oral Q M,W,F-HD  .  multivitamin  1 tablet Oral BID  . sodium chloride  3 mL Intravenous Q12H  . sodium chloride  3 mL Intravenous Q12H  . vancomycin  500 mg Intravenous Q M,W,F-HD   . phenylephrine (NEO-SYNEPHRINE) Adult infusion 15 mcg/min (06/09/13 0614)   sodium chloride, heparin, heparin, lidocaine (PF), lidocaine-prilocaine, metoprolol, pentafluoroprop-tetrafluoroeth, RESOURCE THICKENUP CLEAR, sodium chloride

## 2013-06-10 DIAGNOSIS — R627 Adult failure to thrive: Secondary | ICD-10-CM

## 2013-06-10 DIAGNOSIS — Z515 Encounter for palliative care: Secondary | ICD-10-CM

## 2013-06-10 LAB — RENAL FUNCTION PANEL
Albumin: 1.2 g/dL — ABNORMAL LOW (ref 3.5–5.2)
BUN: 19 mg/dL (ref 6–23)
CO2: 18 meq/L — AB (ref 19–32)
Calcium: 9.9 mg/dL (ref 8.4–10.5)
Chloride: 95 mEq/L — ABNORMAL LOW (ref 96–112)
Creatinine, Ser: 3.47 mg/dL — ABNORMAL HIGH (ref 0.50–1.10)
GFR calc Af Amer: 14 mL/min — ABNORMAL LOW (ref >90)
GFR calc non Af Amer: 12 mL/min — ABNORMAL LOW (ref >90)
GLUCOSE: 383 mg/dL — AB (ref 70–99)
POTASSIUM: 4 meq/L (ref 3.7–5.3)
Phosphorus: 3.5 mg/dL (ref 2.3–4.6)
Sodium: 130 mEq/L — ABNORMAL LOW (ref 137–147)

## 2013-06-10 LAB — GLUCOSE, CAPILLARY
Glucose-Capillary: 280 mg/dL — ABNORMAL HIGH (ref 70–99)
Glucose-Capillary: 223 mg/dL — ABNORMAL HIGH (ref 70–99)
Glucose-Capillary: 149 mg/dL — ABNORMAL HIGH (ref 70–99)
Glucose-Capillary: 143 mg/dL — ABNORMAL HIGH (ref 70–99)

## 2013-06-10 MED ORDER — MIDODRINE HCL 5 MG PO TABS
10.0000 mg | ORAL_TABLET | Freq: Three times a day (TID) | ORAL | Status: DC
  Administered 2013-06-10 – 2013-06-18 (×20): 10 mg via ORAL
  Filled 2013-06-10 (×27): qty 2

## 2013-06-10 NOTE — Progress Notes (Signed)
PULMONARY / CRITICAL CARE MEDICINE   Name: Katrina Meyer MRN: [id number] DOB: 03-23-1932    ADMISSION DATE:  05/31/2013 CONSULTATION DATE:  06/07/13 REFERRING MD :  Dr. Bridgett Larsson  PRIMARY SERVICE: [facility] --> PCCM (5/4)  CHIEF COMPLAINT:  Hypotension  BRIEF PATIENT DESCRIPTION: 80 y/o F with PMH of ESRD admitted 4/27 with AMS/Hypotension.  AMS thought related to progressive Parkinson's Disease + component of vascular dementia.  Recent admit 4/9-4/25 at [facility] for R heel osteomyelitis. During that hospitalization, she underwent:   -I&D of deep abscess and third right ray amputation (05/18/13) status post repeat I&D and wound closure of right hand, 4/20.  -proximalization/revision of AVG, ligation of distal AVG, endarterectomy right brachial artery 05/17/13 by Dr. Maudie Mercury.  -PTA right ulnar 90% to 20% residual with steal phenomena and threatened hand. Wound cultures grew Serratia.  -right lower extremity arteriogram 05/25/13, status post mechanical thrombectomy; PTA/stent 80% AT artery to 30% et Ronney Asters. -Placement of lytic catheter during procedure with administration of lytics overnight to improve flow. DES was placed at the proximal edge of the AT stent.  .................................................................................................................................................................................................................  SIGNIFICANT EVENTS: 4/27 Admit to [facility] with AMS / Hypotension per [facility].  5/03 Tx to [facility] for evaluation by Dr. Oneida Alar and Dr. [name] 5/04 L Fem Tunneled HD placement with hypotension post-op. Vasopressors initiated.  5/05 Goals of care discussion initiated 5/06 Persistent hypotension prohibits HD. Deemed by Dr Jonnie Finner and Dr Alva Garnet to not be a candidate for CRRT 5/06 Palliative care consult requested. Plan family conference 5/07 5/07 Remains on vasopressors. No HD planned until after palliative care mtg completed  STUDIES:  4/27 CT head:  Stable atrophy with moderate chronic microvascular ischemic disease involving the supratentorial white matter. Remote left basal ganglia infarct 4/28 MIR brain: No definite acute intracranial abnormality 4/28 Echocardiogram:  EF greater 70%, moderate LVH, unable to determine diastolic dysfunction, mild AS 4/30 MRI R foot: Skin ulceration over the lateral aspect of the right heel with underlying marrow signal abnormality in the calcaneus compatible with osteomyelitis. Negative for abscess. 5/01 EEG:  moderate generalized slowing, no epileptiform activity observed    LINES / TUBES: R FEM Tunneled HD 5/4 >>  CULTURES: C-Diff 4/29 >> NEG blood 4/27 >> 1/2 coag neg staph R Foot  5/2 >> multiple organisms  ANTIBIOTICS: Zosyn 4/27>>5/1 Flagyl 5/1 >> 5/04 Vanco (MWF) 4/27 >> 5/07 Cefepime (MWF) 5/1>>    SUBJECTIVE:  Nonverbal. No distress. Persistent hypotension  VITAL SIGNS: Temp:  [97.3 F (36.3 C)-98.4 F (36.9 C)] 97.3 F (36.3 C) (05/07 1130) Pulse Rate:  [70-129] 70 (05/07 1345) Resp:  [15-63] 30 (05/07 1345) BP: (39-107)/(15-74) 104/71 mmHg (05/07 1345) SpO2:  [92 %-100 %] 100 % (05/07 1330) Weight:  [65.1 kg (143 lb 8.3 oz)] 65.1 kg (143 lb 8.3 oz) (05/07 0500)  HEMODYNAMICS:    VENTILATOR SETTINGS:    INTAKE / OUTPUT: Intake/Output     05/06 0701 - 05/07 0700 05/07 0701 - 05/08 0700   I.V. (mL/kg) 1901.6 (29.2) 167.6 (2.6)   IV Piggyback 150    Total Intake(mL/kg) 2051.6 (31.5) 167.6 (2.6)   Net +2051.6 +167.6        Stool Occurrence 2 x      PHYSICAL EXAMINATION: General: NAD Neuro: paucity of spontaneous movement HEENT: NAD Cardiovascular: IRIR, mildly tachycardic Lungs: clear anteriorly, unlabored  Abdomen:  Soft, NT, +BS Ext; L AKA, cool, no edema, multiple necrotic digits on R foot  LABS: I have reviewed all of today's lab results. Relevant  abnormalities are discussed in the A/P section  CXR: NNF  ASSESSMENT / PLAN:  VASCULAR A: Severe PVD  with Critical Limb Ischemia S/P L AKA Ischemic R hand, S/P 3rd digit amputation P:  Mgmt per VVS and WOC  CARDIOVASCULAR A:  Hypotension CAF, rate adequately controlled P:  Cont phenylephrine to goal MAP of 55 mmHg Resume midodrine Will not add further vasopressors  INFECTIOUS A:   R Heel Osteomyelitis  R Foot Ulcerations (Multiple) Multiple Pressure Ulcers R Hand Infection - s/p I&D, 3rd digit amputation 4/16 P:   Micro and abx as above  NEUROLOGIC A:   Severe baseline dementia Parkinson's Disease Hx CVA  Pain P:   D/C Aricept Cont Sinemet if able to take POs Cont analgesia PRN  RENAL A:   ESRD Hypokalemia, resolved Not a candidate for CRRT due to multitude of ES illnesses P:   HD per Renal if/when BP permits Monitor BMET intermittently Monitor I/Os Correct electrolytes as indicated if possible  GASTROINTESTINAL A:   Concern for Dysphagia Severe Protein Calorie Malnutrition  Diarrhea P:   SUP: N/I Cont PO nutrition as tolerated  HEMATOLOGIC A:   Mild anemia without acute blood loss P:  DVT px: SCDs Monitor CBC intermittently Transfuse per usual ICU guidelines  ENDOCRINE A:   DM 2 Hypothyroidism   P:   Cont CBGs/SSI Cont levothyroxine   PULMONARY A: No acute issues  P:   Supp O2 as needed to maintain SpO2 > 92%  Palliative care mtg today   Merton Border, MD ; [facility] service Mobile [phone].  After 5:30 PM or weekends, call [phone]   06/10/2013, 2:01 PM

## 2013-06-10 NOTE — Progress Notes (Signed)
NUTRITION FOLLOW UP  Intervention:   Continue dysphagia 2 diet per SLP's discretion. Continue Resource Breeze BID. RD to continue to follow nutrition care plan.  Nutrition Dx:   Inadequate oral intake related to decreased responsiveness as evidenced by NPO; resolved.  Inadequate oral intake related to decreased responsiveness as evidenced by minimal intake per family report  Goal:   Pt will meet >90% of estimated nutritional needs; goal not met  Monitor:   Diet advancement vs initiation of nutrition support, PO intake, labs, skin assessments, I/O's  Assessment:   Pt admitted to [facility] on 4/27 for acute encephalopathy. PMHx includes ESRD, DM2, PAD, advanced dementia, advanced Parkinson's disease. R heel osteomyelitis was confirmed by MRI during this admission.  SLP BSE on 4/28 - recommending NPO. Repeat visit by SLP on 4/30, noted that RN and family reported concern at that time  2/2 lack of nutrition and hydration since admission. SLP recommended consideration of feeding tube at that time. Per chart review, pt was started on Dysphagia 2 diet by MD on 5/2. Daughter was asking that Nepro Shakes be discontinued because they cause the patient to have diarrhea, pt was changed to Resource Breeze by MD.  Pt developed clotted fistula on 5/3, transferred to [facility] on same day.  NPO for possible procedure today.  Pt's family reports minimal PO intake. Family has been bringing her [employer] and smoothies with added whey protein because pt accepts it better than hospital food. She is eating mostly soups and smoothies. Reported that she does not like the hospital eggs, but likes the oatmeal and grits. She also drinks at least 2 Resource Breeze each day and if she is not eating well that day family will give her more.   Per renal note 5/6- pt remains unstable and not a candidate for HD given current condition. Also not a candidate for CRRT 2/2 chronic and severe co morbidities.  (Pt has been on HD x 10  years)  Per chart review, family conference to be today 5/7 to determine goals of care.   Pt's dry weight per renal is 55 kg, and is currently 65 kg. Per chart, pt has +1 generalized edema and +3 LUE edema. Weight gain could be due to edema.   Potassium now WNL (was 3.6 on 5/4)  Phosphorus WNL CBG: 223  Height: Ht Readings from Last 1 Encounters:  06/06/13 5' 6.93" (1.7 m)    Weight Status:   Wt Readings from Last 1 Encounters:  06/10/13 143 lb 8.3 oz (65.1 kg)  Admit wt 118 lb/53.6 kg  SBW: 58.5 kg  Re-estimated needs:  Kcal: 1900-2100 daily Protein: > 87 grams daily Fluid: 1-1.5 L daily  Skin:  Stage II pressure ulcer right sacrum Stage I pressure ulcer left sacrum  Unstageable pressure ulcer mid coccyx Unstageable pressure ulcer right heel Unstageable pressure ulcer right ankle  Surgical incision right hand  Surgical incision right groin  Surgical incision right neck   Diet Order: Dysphagia 2 with thin liquids   Intake/Output Summary (Last 24 hours) at 06/10/13 1401 Last data filed at 06/10/13 1130  Gross per 24 hour  Intake 1557.81 ml  Output      0 ml  Net 1557.81 ml    Last BM: 5/7   Labs:   Recent Labs Lab 06/08/13 0550 06/09/13 0450 06/10/13 0500  NA 139 137 130*  K 4.9 4.0 4.0  CL 104 104 95*  CO2 22 21 18*  BUN 14 17 19  CREATININE 2.99*   3.31* 3.47*  CALCIUM 9.4 9.8 9.9  PHOS 2.9 2.9 3.5  GLUCOSE 280* 157* 383*    CBG (last 3)   Recent Labs  06/09/13 2126 06/10/13 0754 06/10/13 1207  GLUCAP 247* 280* 223*    Scheduled Meds: . acidophilus  1 capsule Oral Daily  . antiseptic oral rinse  15 mL Mouth Rinse q12n4p  . carbidopa-levodopa  2 tablet Oral TID  . chlorhexidine  15 mL Mouth Rinse BID  . clopidogrel  75 mg Oral Q breakfast  . collagenase   Topical Daily  . feeding supplement (RESOURCE BREEZE)  1 Container Oral BID BM  . insulin aspart  0-5 Units Subcutaneous QHS  . insulin aspart  0-9 Units Subcutaneous TID WC  .  levothyroxine  25 mcg Oral QAC breakfast  . midodrine  10 mg Oral TID WC  . multivitamin  1 tablet Oral BID  . sodium chloride  3 mL Intravenous Q12H  . sodium chloride  3 mL Intravenous Q12H    Continuous Infusions: . phenylephrine (NEO-SYNEPHRINE) Adult infusion 110 mcg/min (06/10/13 1130)    Rebekah L Matznick, BS Dietetic Intern Pager: [phone]  I agree with student dietitian note; appropriate revisions have been made.  Kimberly Harris, RD, LDN, CNSC Pager# [phone] After Hours Pager# [phone]     

## 2013-06-10 NOTE — Consult Note (Signed)
Patient PT:WSFKCL CHARMEL PRONOVOST      DOB: 11/18/1932      EXN:[id number]     Consult Note from the Palliative Medicine Team at [place] Requested by: Dr. Alva Garnet    PCP: Glenda Chroman., MD Reason for Consultation: [place] and options.   Phone Number:[phone]  Assessment of patients Current state: Ms. Samonte is a 80 yo female with altered mental status and right heel osteomyelitis history of ESRD with hemodialysis (~10 years), Parkinson's disease (family questions this diagnosis) + vascular dementia. Recent admission at [facility] for osteomyelitis (numerous surgical interventions including: I&Ds, amputation, endarterectomy, arteriogram with thrombectomy).   I met today with Ms. Fehring's daughter Taler Kushner), son-in-law Remo Lipps [place]), and sister Anthoney Harada). We had a long discussion today. They family has many concerns about her current health state and that we are doing everything to try and improve her health state. They are asking about infectious disease consult, WOC consult, and and collaboration/information from her previous care from [facility]. They also asked about taking the BP on her leg and if this is necessary (I addressed this) and also asking about flexibility and possibly allowing them to stay at night to help keep her safe. I addressed this concerns via telephone with Dr. Alva Garnet. They also tell me that they are not willing and her husband, who would not attend this meeting, not willing to make any decisions until above items addressed (including code status). I did discuss poor prognosis and quality of life and the importance of considering these decisions. They tell me that her husband was on life support and ventilator and he has thrived and lives a full life now. I explained that Ms. Pilch is already in such a weakened state at admission that putting her through a code would be much different and difficult/painful for her. They listened but redirected the conversation to their previous  concerns. I discussed with Dr. Alva Garnet and Dr. Jonnie Finner. I will continue to follow and support Ms. Cocke and her family.    Goals of Care: 1.  Code Status: FULL   2. Scope of Treatment: Continue all available and offered medical interventions. No limitations on care at this point.    4. Disposition: To be determined on outcomes.    3. Symptom Management:   1. Weakness: Continue medical management.   4. Psychosocial: Emotional support provided to patient and family.   5. Spiritual: Consulted for visit.    Patient Documents Completed or Given: Document Given Completed  Advanced Directives Pkt    MOST    DNR    Gone from My Sight    Hard Choices yes     Brief HPI: 80 yo female with multiple serious health concerns including 10 year history of ESRD with hemodialysis, right heel osteomyelitis, Parkinson's with vascular dementia. Serious acute illness requiring vasopressors and unable to receive dialysis.    ROS: Unable to elicit - nonverbal.     PMH:  Past Medical History  Diagnosis Date  . Hyperlipidemia     takes Lipitor daily  . DVT (deep venous thrombosis)   . Stroke   . Parkinson's disease     takes Sinemet daily  . Tibia/fibula fracture 01/04/2012    Treated with long leg casting. Cast change in hospital 02/27/12   . Peripheral vascular disease 02/27/2012    Skin ulcer of right great toe  . Atrial fibrillation 03/09/2012    Seen on older EKG as well but family not aware.   Marland Kitchen  Macrocytic anemia 03/09/2012  . Lower GI bleed 03/10/2012    hematochezia secondary to ulcerated polyps; diverticulosis; chronic diarrhea  . End stage renal disease on dialysis     [facility]- MWF  . Hypertension     takes Coreg daily  . Dementia     takes Aricept daily  . Diabetes mellitus, type II     takes Lantus and Novolog  . Seasonal allergies     takes Allegra daily  . Depression     takes Prozac daily  . Hypothyroidism     takes Synthroid daily  . Osteomyelitis of right  foot 06/03/2013  . PAD (peripheral artery disease) 05/2013    [place] ischemia; s/p revasc; s/p I&D of deep abscess and 3rd ray amputation  . PAD (peripheral artery disease) 05/2013    [facility]vance I&D right ulnar wound-grew serratia  . Dry gangrene     Right foot     PSH: Past Surgical History  Procedure Laterality Date  . Dg av dialysis graft declot or    . Av fistula repair  Aug. 2013    Left arm  . Cholecystectomy    . Colon surgery      Aprrox 2006 at [facility] after she had a colonoscopy  . Colonoscopy  03/10/2012    Procedure: COLONOSCOPY;  Surgeon: Rogene Houston, MD;  [facility];  Service: Endoscopy;  Laterality: N/A;  . Esophagogastroduodenoscopy  03/10/2012    Procedure: ESOPHAGOGASTRODUODENOSCOPY (EGD);  Surgeon: Rogene Houston, MD;  Location: [facility];  Service: Endoscopy;  Laterality: N/A;  EGD if TCS normal.  . Ligation of arteriovenous  fistula Left 03/24/2012    Procedure: LIGATION OF ARTERIOVENOUS  FISTULA;  Surgeon: Conrad [place], MD;  Location: [facility];  Service: Vascular;  Laterality: Left;  . Av fistula placement Right 04/28/2012    Procedure: INSERTION OF ARTERIOVENOUS (AV) GORE-TEX GRAFT ARM;  Surgeon: Elam Dutch, MD;  Location: [facility] OR;  Service: Vascular;  Laterality: Right;  . Leg amputation above knee Left   . Angioplast of lle  12/2012-01/2013    Several Procedures  . Esophagogastroduodenoscopy N/A 02/14/2013    Procedure: ESOPHAGOGASTRODUODENOSCOPY (EGD);  Surgeon: Milus Banister, MD;  Location: [facility];  Service: Endoscopy;  Laterality: N/A;  . Cardiac catheterization  03/08/2004    Normal coronary arteries and normal LV function. Normal aortoiliac anatomy suitable for renal transplant.  . Transthoracic echocardiogram  02/07/2004    EF 60%, no evidence of significant valvular abnormalities.  . Finger amputation Right     middle  . Angioplasty rll  12/2012, 2015    at [facility]  . Angioplasty r hand  12/2012, 2015     at [facility]  . Insertion of dialysis catheter Right 06/07/2013    Procedure: INSERTION OF DIALYSIS CATHETER;  Surgeon: Conrad , MD;  Location: [facility];  Service: Vascular;  Laterality: Right;   I have reviewed the [place] and SH and  If appropriate update it with new information. Allergies  Allergen Reactions  . Hydralazine Rash  . Hydrochlorothiazide Hives and Rash   Scheduled Meds: . acidophilus  1 capsule Oral Daily  . antiseptic oral rinse  15 mL Mouth Rinse q12n4p  . carbidopa-levodopa  2 tablet Oral TID  . chlorhexidine  15 mL Mouth Rinse BID  . clopidogrel  75 mg Oral Q breakfast  . collagenase   Topical Daily  . feeding supplement (RESOURCE BREEZE)  1 Container Oral  BID BM  . insulin aspart  0-5 Units Subcutaneous QHS  . insulin aspart  0-9 Units Subcutaneous TID WC  . levothyroxine  25 mcg Oral QAC breakfast  . midodrine  10 mg Oral TID WC  . multivitamin  1 tablet Oral BID  . sodium chloride  3 mL Intravenous Q12H  . sodium chloride  3 mL Intravenous Q12H   Continuous Infusions: . phenylephrine (NEO-SYNEPHRINE) Adult infusion 60 mcg/min (06/10/13 1500)   PRN Meds:.sodium chloride, heparin, heparin, lidocaine (PF), lidocaine-prilocaine, metoprolol, pentafluoroprop-tetrafluoroeth, RESOURCE THICKENUP CLEAR, sodium chloride    BP 88/35  Pulse 97  Temp(Src) 97.3 F (36.3 C) (Oral)  Resp 18  Ht 5' 6.93" (1.7 m)  Wt 65.1 kg (143 lb 8.3 oz)  BMI 22.53 kg/m2  SpO2 100%   PPS: 20%   Intake/Output Summary (Last 24 hours) at 06/10/13 1536 Last data filed at 06/10/13 1500  Gross per 24 hour  Intake 1619.11 ml  Output      0 ml  Net 1619.11 ml   LBM: 06/10/13                         Physical Exam:  General: NAD, eyes open, nonverbal HEENT: [place]/AT, no JVD, dry oral mucosa without exudate Chest: CTA but decreased bilateral bases, no labored breathing CVS: Irreg, tachycardic, S1 S2 Abdomen: Soft, NT, ND, +BS Ext: Old left AKA, LUA 2+ edema, right hand dressing CDI, eschar  to right heel and lateral foot, right foot digits blackened Neuro: Alert, unable to assess orientation - not responding verbally or following commands  Labs: CBC    Component Value Date/Time   WBC 13.6* 06/06/2013 0619   RBC 3.49* 06/06/2013 0619   RBC 3.47* 03/09/2012 2004   HGB 10.7* 06/06/2013 0619   HCT 32.6* 06/06/2013 0619   PLT 237 06/06/2013 0619   MCV 93.4 06/06/2013 0619   MCH 30.7 06/06/2013 0619   MCHC 32.8 06/06/2013 0619   RDW 17.2* 06/06/2013 0619   LYMPHSABS 1.0 05/31/2013 1827   MONOABS 2.8* 05/31/2013 1827   EOSABS 0.0 05/31/2013 1827   BASOSABS 0.0 05/31/2013 1827    BMET    Component Value Date/Time   NA 130* 06/10/2013 0500   K 4.0 06/10/2013 0500   CL 95* 06/10/2013 0500   CO2 18* 06/10/2013 0500   GLUCOSE 383* 06/10/2013 0500   BUN 19 06/10/2013 0500   CREATININE 3.47* 06/10/2013 0500   CALCIUM 9.9 06/10/2013 0500   GFRNONAA 12* 06/10/2013 0500   GFRAA 14* 06/10/2013 0500    CMP     Component Value Date/Time   NA 130* 06/10/2013 0500   K 4.0 06/10/2013 0500   CL 95* 06/10/2013 0500   CO2 18* 06/10/2013 0500   GLUCOSE 383* 06/10/2013 0500   BUN 19 06/10/2013 0500   CREATININE 3.47* 06/10/2013 0500   CALCIUM 9.9 06/10/2013 0500   PROT 5.7* 05/31/2013 1831   ALBUMIN 1.2* 06/10/2013 0500   AST 20 05/31/2013 1831   ALT 5 05/31/2013 1831   ALKPHOS 190* 05/31/2013 1831   BILITOT 0.6 05/31/2013 1831   GFRNONAA 12* 06/10/2013 0500   GFRAA 14* 06/10/2013 0500    Time In Time Out Total Time Spent with Patient Total Overall Time  1400 1545 9mn 1077m    Greater than 50%  of this time was spent counseling and coordinating care related to the above assessment and plan.   AlVinie SillNP Palliative Medicine Team Pager # [phone]  M-F 8a-5p) Team Phone # [phone] (Nights/Weekends)

## 2013-06-10 NOTE — Progress Notes (Signed)
Katrina Meyer Progress Note   Subjective: No changes overnight, remains on pressors, BP's in 70's, pt nonverbal  Filed Vitals:   06/10/13 0645 06/10/13 0700 06/10/13 0715 06/10/13 0730  BP: 90/37 95/43 95/48  102/40  Pulse: 97 106 100 104  Temp:    97.3 F (36.3 C)  TempSrc:    Oral  Resp: 22 21 19 25   Height:      Weight:      SpO2: 100% 100% 100% 100%   Exam: Frail elderly female, opens eyes, no verbal response, does not follow commands No jvd Chest dec'd at bases Irregular, tachy, no RG Abd soft, NTND, +BS +bilat 2+ edema of UE's, trace LE edema Large eschar R heel, large eschar lateral aspect of R foot with dusky 5th toe; R hand wounds appear clean s/p I&D dorsal abcess and removal 3rd finger Neuro is diffusely weak, not following commands R fem Diatek cath in place  HD: MWF [place] 3.5h   3/2.5 Bath   55kg  Heparin 6300   R thigh Diatek Hect 2   Aranesp 25/wk   Venofer 50/wk tsat 56%  Assessment: 1 AMS - acute on background of chronic dementia 2 Septic shock- remains on pressors 3 Multiple wounds- R hand wound s/p I&D dorsal abcess and removal 3rd finger; also R heel large eschar and R lat foot large eschar w dusky 5th toe 4 PVD L BKA 5 ESRD on HD x 10 yrs approx 6 Diarrhea - Cdif neg 7 Afib / ^trop 8 Access- R AVG clotted, no declot due to vasc compromise of R hand, now has R thigh tunneled cath 9 Anemia on aranesp 10 MBD ^Ca holding D and use low Ca bath 11 Dementia / Parkinson's 12 DM  Plan- remains unstable, no new suggestions. Palliative care mtg for today    Kelly Splinter MD  pager [phone]    cell [phone]  06/10/2013, 8:49 AM     Recent Labs Lab 06/08/13 0550 06/09/13 0450 06/10/13 0500  NA 139 137 130*  K 4.9 4.0 4.0  CL 104 104 95*  CO2 22 21 18*  GLUCOSE 280* 157* 383*  BUN 14 17 19   CREATININE 2.99* 3.31* 3.47*  CALCIUM 9.4 9.8 9.9  PHOS 2.9 2.9 3.5    Recent Labs Lab 06/08/13 0550 06/09/13 0450  06/10/13 0500  ALBUMIN 1.3* 1.2* 1.2*    Recent Labs Lab 06/04/13 0557 06/05/13 0644 06/06/13 0619  WBC 17.1* 14.8* 13.6*  HGB 11.2* 11.3* 10.7*  HCT 34.8* 34.4* 32.6*  MCV 93.8 93.0 93.4  PLT 266 253 237   . acidophilus  1 capsule Oral Daily  . antiseptic oral rinse  15 mL Mouth Rinse q12n4p  . carbidopa-levodopa  2 tablet Oral TID  . ceFEPime (MAXIPIME) IV  500 mg Intravenous Q24H  . chlorhexidine  15 mL Mouth Rinse BID  . clopidogrel  75 mg Oral Q breakfast  . collagenase   Topical Daily  . donepezil  10 mg Oral QHS  . feeding supplement (RESOURCE BREEZE)  1 Container Oral BID BM  . insulin aspart  0-5 Units Subcutaneous QHS  . insulin aspart  0-9 Units Subcutaneous TID WC  . levothyroxine  25 mcg Oral QAC breakfast  . loperamide  2 mg Oral BID  . multivitamin  1 tablet Oral BID  . sodium chloride  3 mL Intravenous Q12H  . sodium chloride  3 mL Intravenous Q12H  . vancomycin  500 mg Intravenous Q M,W,F-HD   .  phenylephrine (NEO-SYNEPHRINE) Adult infusion 150 mcg/min (06/10/13 0728)   sodium chloride, heparin, heparin, lidocaine (PF), lidocaine-prilocaine, metoprolol, pentafluoroprop-tetrafluoroeth, RESOURCE THICKENUP CLEAR, sodium chloride

## 2013-06-11 DIAGNOSIS — E43 Unspecified severe protein-calorie malnutrition: Secondary | ICD-10-CM

## 2013-06-11 LAB — RENAL FUNCTION PANEL
Albumin: 1.1 g/dL — ABNORMAL LOW (ref 3.5–5.2)
BUN: 24 mg/dL — AB (ref 6–23)
CALCIUM: 10.3 mg/dL (ref 8.4–10.5)
CO2: 19 mEq/L (ref 19–32)
Chloride: 97 mEq/L (ref 96–112)
Creatinine, Ser: 3.79 mg/dL — ABNORMAL HIGH (ref 0.50–1.10)
GFR calc Af Amer: 12 mL/min — ABNORMAL LOW (ref >90)
GFR calc non Af Amer: 10 mL/min — ABNORMAL LOW (ref >90)
GLUCOSE: 144 mg/dL — AB (ref 70–99)
PHOSPHORUS: 3.9 mg/dL (ref 2.3–4.6)
Potassium: 4.3 mEq/L (ref 3.7–5.3)
Sodium: 131 mEq/L — ABNORMAL LOW (ref 137–147)

## 2013-06-11 LAB — GLUCOSE, CAPILLARY
GLUCOSE-CAPILLARY: 142 mg/dL — AB (ref 70–99)
Glucose-Capillary: 108 mg/dL — ABNORMAL HIGH (ref 70–99)
Glucose-Capillary: 114 mg/dL — ABNORMAL HIGH (ref 70–99)
Glucose-Capillary: 111 mg/dL — ABNORMAL HIGH (ref 70–99)
Glucose-Capillary: 98 mg/dL (ref 70–99)

## 2013-06-11 MED ORDER — LIDOCAINE-PRILOCAINE 2.5-2.5 % EX CREA: 1.0000 "application " | TOPICAL_CREAM | CUTANEOUS | Status: DC | PRN

## 2013-06-11 MED ORDER — PENTAFLUOROPROP-TETRAFLUOROETH EX AERO: 1.0000 "application " | INHALATION_SPRAY | CUTANEOUS | Status: DC | PRN

## 2013-06-11 MED ORDER — NEPRO/CARBSTEADY PO LIQD
237.0000 mL | ORAL | Status: DC | PRN
  Filled 2013-06-11: qty 237

## 2013-06-11 MED ORDER — HEPARIN SODIUM (PORCINE) 1000 UNIT/ML DIALYSIS
2000.0000 [IU] | INTRAMUSCULAR | Status: DC | PRN
  Administered 2013-06-12: 1000 [IU] via INTRAVENOUS_CENTRAL
  Filled 2013-06-11 (×2): qty 2

## 2013-06-11 MED ORDER — SODIUM CHLORIDE 0.9 % IV SOLN: 100.0000 mL | INTRAVENOUS | Status: DC | PRN

## 2013-06-11 MED ORDER — ALTEPLASE 2 MG IJ SOLR
2.0000 mg | Freq: Once | INTRAMUSCULAR | Status: AC | PRN
  Filled 2013-06-11: qty 2

## 2013-06-11 MED ORDER — LIDOCAINE HCL (PF) 1 % IJ SOLN: 5.0000 mL | INTRAMUSCULAR | Status: DC | PRN

## 2013-06-11 MED ORDER — DEXTROSE 5 % IV SOLN
2.0000 g | Freq: Once | INTRAVENOUS | Status: AC
  Administered 2013-06-11: 2 g via INTRAVENOUS
  Filled 2013-06-11: qty 2

## 2013-06-11 MED ORDER — ALBUMIN HUMAN 25 % IV SOLN
INTRAVENOUS | Status: AC
  Administered 2013-06-11: 25 g
  Filled 2013-06-11: qty 200

## 2013-06-11 MED ORDER — HEPARIN SODIUM (PORCINE) 1000 UNIT/ML DIALYSIS: 1000.0000 [IU] | INTRAMUSCULAR | Status: DC | PRN

## 2013-06-11 NOTE — Procedures (Signed)
I was present at this dialysis session, have reviewed the session itself and made  appropriate changes  Kelly Splinter MD (pgr) [phone]    ([phone] 06/11/2013, 4:15 PM

## 2013-06-11 NOTE — Progress Notes (Signed)
PULMONARY / CRITICAL CARE MEDICINE   Name: Katrina Meyer MRN: [id number] DOB: 11-22-1932    ADMISSION DATE:  05/31/2013 CONSULTATION DATE:  06/07/13 REFERRING MD :  Dr. Bridgett Larsson  PRIMARY SERVICE: [facility] --> PCCM (5/4)  CHIEF COMPLAINT:  Hypotension  BRIEF PATIENT DESCRIPTION: 80 y/o F with PMH of ESRD admitted 4/27 with AMS/Hypotension.  AMS thought related to progressive Parkinson's Disease + component of vascular dementia.  Recent admit 4/9-4/25 at [facility] for R heel osteomyelitis. During that hospitalization, she underwent:   -I&D of deep abscess and third right ray amputation (05/18/13) status post repeat I&D and wound closure of right hand, 4/20.  -proximalization/revision of AVG, ligation of distal AVG, endarterectomy right brachial artery 05/17/13 by Dr. Maudie Mercury.  -PTA right ulnar 90% to 20% residual with steal phenomena and threatened hand. Wound cultures grew Serratia.  -right lower extremity arteriogram 05/25/13, status post mechanical thrombectomy; PTA/stent 80% AT artery to 30% et Ronney Asters. -Placement of lytic catheter during procedure with administration of lytics overnight to improve flow. DES was placed at the proximal edge of the AT stent.  .................................................................................................................................................................................................................  SIGNIFICANT EVENTS: 4/27 Admit to [facility] with AMS / Hypotension per [facility].  5/03 Tx to [facility] for evaluation by Dr. Oneida Alar and Dr. [name] 5/04 L Fem Tunneled HD placement with hypotension post-op. Vasopressors initiated.  5/05 Goals of care discussion initiated 5/06 Persistent hypotension prohibits HD. Deemed by Dr Jonnie Finner and Dr Alva Garnet to not be a candidate for CRRT 5/06 Palliative care consult requested. Plan family conference 5/07 5/07 Remains on vasopressors. No HD planned until after palliative care mtg completed.  5/07 Palliative Care mtg:  family expressing unrealistic hopes for outcome that not likely attainable and indicating a desire for continued aggressive care 5/08 Weaned off vasopressors briefly. Episode of symptomatic hypotension at initiation of HD. Phenylephrine restarted. HD completed  STUDIES:  4/27 CT head: Stable atrophy with moderate chronic microvascular ischemic disease involving the supratentorial white matter. Remote left basal ganglia infarct 4/28 MRI brain: No definite acute intracranial abnormality 4/28 Echocardiogram:  EF greater 70%, moderate LVH, unable to determine diastolic dysfunction, mild AS 4/30 MRI R foot: Skin ulceration over the lateral aspect of the right heel with underlying marrow signal abnormality in the calcaneus compatible with osteomyelitis. Negative for abscess. 5/01 EEG:  moderate generalized slowing, no epileptiform activity observed   LINES / TUBES: R fem Tunneled HD 5/4 >>   CULTURES: C-Diff 4/29 >> NEG blood 4/27 >> 1/2 coag neg staph R Foot  5/2 >> multiple organisms  ANTIBIOTICS: Zosyn 4/27>>5/1 Flagyl 5/1 >> 5/04 Vanco (MWF) 4/27 >> 5/07 Cefepime (MWF) 5/1>>    SUBJECTIVE:  Remains nonverbal. No distress.   VITAL SIGNS: Temp:  [97.2 F (36.2 C)-97.5 F (36.4 C)] 97.4 F (36.3 C) (05/08 1230) Pulse Rate:  [42-132] 105 (05/08 1600) Resp:  [13-47] 32 (05/08 1600) BP: (52-168)/(21-104) 95/72 mmHg (05/08 1600) SpO2:  [93 %-100 %] 100 % (05/08 1600) Weight:  [66.2 kg (145 lb 15.1 oz)] 66.2 kg (145 lb 15.1 oz) (05/08 0500)  HEMODYNAMICS:    VENTILATOR SETTINGS:    INTAKE / OUTPUT: Intake/Output     05/07 0701 - 05/08 0700 05/08 0701 - 05/09 0700   P.O.  30   I.V. (mL/kg) 971.4 (14.7) 436.4 (6.6)   IV Piggyback     Total Intake(mL/kg) 971.4 (14.7) 466.4 (7)   Net +971.4 +466.4        Stool Occurrence 2 x      PHYSICAL EXAMINATION: General:  NAD Neuro: Seems more alert. paucity of spontaneous movement HEENT: NAD Cardiovascular: IRIR, mildly  tachycardic Lungs: clear anteriorly, unlabored  Abdomen:  Soft, NT, +BS Ext; L AKA, cool, no edema, multiple necrotic digits on R foot  LABS: I have reviewed all of today's lab results. Relevant abnormalities are discussed in the A/P section  CXR: NNF  ASSESSMENT / PLAN:  VASCULAR A: Severe PVD with Critical Limb Ischemia S/P L AKA Ischemic R hand, S/P 3rd digit amputation P:  Mgmt per VVS and WOC  CARDIOVASCULAR A:  Hypotension, improving CAF, rate adequately controlled P:  Cont phenylephrine to goal MAP of 55 mmHg Resume midodrine Will not add further vasopressors  INFECTIOUS A:   R Heel Osteomyelitis  R Foot Ulcerations (Multiple) Multiple Pressure Ulcers R Hand Infection - s/p I&D, 3rd digit amputation 4/16 P:   Micro and abx as above  NEUROLOGIC A:   Severe baseline dementia Parkinson's Disease Hx CVA  Pain P:   Holding Aricept - little role in acute illness Cont Sinemet if able to take POs Cont analgesia PRN  RENAL A:   ESRD Hypokalemia, resolved Not a candidate for CRRT due to multitude of ES illnesses P:   HD per Renal as tolerated Monitor BMET intermittently Monitor I/Os Correct electrolytes as indicated if possible  GASTROINTESTINAL A:   Concern for Dysphagia Severe Protein Calorie Malnutrition  Diarrhea P:   SUP: N/I Cont PO nutrition as tolerated  HEMATOLOGIC A:   Mild anemia without acute blood loss P:  DVT px: SCDs Monitor CBC intermittently Transfuse per usual ICU guidelines  ENDOCRINE A:   DM 2 Hypothyroidism   P:   Cont CBGs/SSI Cont levothyroxine   PULMONARY A: No acute issues  P:   Supp O2 as needed to maintain SpO2 > 92%   No family available for update.   Merton Border, MD ; [facility] [phone].  After 5:30 PM or weekends, call [phone]   06/11/2013, 4:11 PM

## 2013-06-11 NOTE — Progress Notes (Signed)
Spent time at bedside talking to patient and being a supportive presence.  Prayed at the end of our time together.

## 2013-06-11 NOTE — Progress Notes (Signed)
Pt hemodialysis Tx initiated. Within the first 15 min of Tx pt dropped her BP to 54/45, HR increased to 132, and she was not responsive to tactile and verbal stimulus. Pt was restarted on Neo Gtt and titrated up to 100 mics per ICU nurse, UF was turned off and  pt was given 25Gms of 5% albumin. BP increased and pt began to open eyes as BP improved

## 2013-06-11 NOTE — Progress Notes (Signed)
ANTIBIOTIC CONSULT NOTE - FOLLOW UP  Pharmacy Consult for Cefepime Indication: R-heel osteomyelitis  Allergies  Allergen Reactions  . Hydralazine Rash  . Hydrochlorothiazide Hives and Rash    Patient Measurements: Height: 5' 6.93" (170 cm) Weight: 145 lb 15.1 oz (66.2 kg) IBW/kg (Calculated) : 61.44  Vital Signs: Temp: 97.5 F (36.4 C) (05/08 0800) Temp src: Oral (05/08 0800) BP: 118/70 mmHg (05/08 1145) Pulse Rate: 111 (05/08 1145) Intake/Output from previous day: 05/07 0701 - 05/08 0700 In: 971.4 [I.V.:971.4] Out: -  Intake/Output from this shift: Total I/O In: 305.1 [P.O.:30; I.V.:275.1] Out: -   Labs:  Recent Labs  06/09/13 0450 06/10/13 0500 06/11/13 0440  CREATININE 3.31* 3.47* 3.79*   Estimated Creatinine Clearance: 11.9 ml/min (by C-G formula based on Cr of 3.79). No results found for this basename: VANCOTROUGH, VANCOPEAK, VANCORANDOM, [place], [place], [place], [place], [place], [place], AMIKACINPEAK, AMIKACINTROU, AMIKACIN,  in the last 72 hours   Microbiology: Recent Results (from the past 720 hour(s))  CULTURE, BLOOD (ROUTINE X 2)     Status: None   Collection Time    05/31/13  6:27 PM      Result Value Ref Range Status   Specimen Description BLOOD LEFT ANTECUBITAL   Final   Special Requests     Final   Value: BOTTLES DRAWN AEROBIC AND ANAEROBIC [place]  IMMUNE:COMPROMISED   Culture NO GROWTH 5 DAYS   Final   Report Status 06/05/2013 FINAL   Final  CULTURE, BLOOD (ROUTINE X 2)     Status: None   Collection Time    05/31/13  7:13 PM      Result Value Ref Range Status   Specimen Description BLOOD LEFT HAND   Final   Special Requests BOTTLES DRAWN AEROBIC ONLY 4CC  IMMUNE:COMPROMISED   Final   Culture  Setup Time     Final   Value: 06/03/2013 10:10     Performed at [facility]   Culture     Final   Value: STAPHYLOCOCCUS SPECIES (COAGULASE NEGATIVE)     Note: IDENTIFIED AS STAPHYLOCOCCUS LUGDUNENSIS     Note: Gram Stain  Report Called to,Read Back By and Verified With: BULLINS MEGAN AT 3664 ON 06/03/13 BY Tyrone Schimke M Performed at [facility]     Performed at [facility]   Report Status 06/07/2013 FINAL   Final   Organism ID, Bacteria STAPHYLOCOCCUS SPECIES (COAGULASE NEGATIVE)   Final  CLOSTRIDIUM DIFFICILE BY PCR     Status: None   Collection Time    06/02/13  2:30 PM      Result Value Ref Range Status   C difficile by pcr NEGATIVE  NEGATIVE Final  WOUND CULTURE     Status: None   Collection Time    06/05/13  4:04 PM      Result Value Ref Range Status   Specimen Description WOUND RIGHT HEEL   Final   Special Requests IMMUNE:COMPROMISED   Final   Gram Stain     Final   Value: NO WBC SEEN     MODERATE SQUAMOUS EPITHELIAL CELLS PRESENT     ABUNDANT GRAM NEGATIVE RODS     MODERATE GRAM POSITIVE COCCI IN PAIRS     IN CLUSTERS   Culture     Final   Value: MULTIPLE ORGANISMS PRESENT, NONE PREDOMINANT     Note: NO STAPHYLOCOCCUS AUREUS ISOLATED NO GROUP A STREP (S.PYOGENES) ISOLATED     Performed at [facility]   Report Status 06/08/2013  FINAL   Final  MRSA PCR SCREENING     Status: None   Collection Time    06/06/13  8:14 PM      Result Value Ref Range Status   MRSA by PCR NEGATIVE  NEGATIVE Final   Comment:            The GeneXpert MRSA Assay (FDA     approved for NASAL specimens     only), is one component of a     comprehensive MRSA colonization     surveillance program. It is not     intended to diagnose MRSA     infection nor to guide or     monitor treatment for     MRSA infections.    Anti-infectives   Start     Dose/Rate Route Frequency Ordered Stop   06/09/13 1200  ceFEPIme (MAXIPIME) 500 mg in dextrose 5 % 50 mL IVPB  Status:  Discontinued     500 mg 100 mL/hr over 30 Minutes Intravenous Every 24 hours 06/09/13 1014 06/10/13 1006   06/04/13 1400  metroNIDAZOLE (FLAGYL) tablet 250 mg  Status:  Discontinued     250 mg Oral 3 times per day 06/04/13 1031  06/08/13 1123   06/04/13 1200  ceFEPIme (MAXIPIME) 2 g in dextrose 5 % 50 mL IVPB  Status:  Discontinued     2 g 100 mL/hr over 30 Minutes Intravenous Every M-W-F (Hemodialysis) 06/04/13 1040 06/09/13 1014   06/04/13 1100  metroNIDAZOLE (FLAGYL) IVPB 250 mg  Status:  Discontinued     250 mg 50 mL/hr over 60 Minutes Intravenous Every 8 hours 06/04/13 1007 06/04/13 1031   06/03/13 1400  piperacillin-tazobactam (ZOSYN) IVPB 2.25 g  Status:  Discontinued     2.25 g 100 mL/hr over 30 Minutes Intravenous Every 8 hours 06/03/13 0911 06/04/13 1007   06/03/13 1200  piperacillin-tazobactam (ZOSYN) IVPB 3.375 g  Status:  Discontinued     3.375 g 12.5 mL/hr over 240 Minutes Intravenous Every 8 hours 06/03/13 0908 06/03/13 0910   06/02/13 1200  vancomycin (VANCOCIN) 500 mg in sodium chloride 0.9 % 100 mL IVPB  Status:  Discontinued     500 mg 100 mL/hr over 60 Minutes Intravenous Every M-W-F (Hemodialysis) 06/01/13 1652 06/10/13 1011   06/01/13 1015  levofloxacin (LEVAQUIN) IVPB 500 mg  Status:  Discontinued     500 mg 100 mL/hr over 60 Minutes Intravenous Every 24 hours 06/01/13 1010 06/01/13 1555   06/01/13 1000  levofloxacin (LEVAQUIN) tablet 500 mg  Status:  Discontinued    Comments:  Pharmacy to dose   500 mg Oral Daily 05/31/13 2234 06/01/13 1009   06/01/13 0400  piperacillin-tazobactam (ZOSYN) IVPB 2.25 g  Status:  Discontinued     2.25 g 100 mL/hr over 30 Minutes Intravenous Every 8 hours 05/31/13 2238 06/03/13 0908   05/31/13 2000  piperacillin-tazobactam (ZOSYN) IVPB 2.25 g     2.25 g 100 mL/hr over 30 Minutes Intravenous  Once 05/31/13 1936 05/31/13 2023   05/31/13 1949  piperacillin-tazobactam (ZOSYN) 3.375 GM/50ML IVPB    Comments:  Gerlene Burdock   : cabinet override      05/31/13 1949 06/01/13 0759   05/31/13 1945  vancomycin (VANCOCIN) IVPB 1000 mg/200 mL premix     1,000 mg 200 mL/hr over 60 Minutes Intravenous  Once 05/31/13 1936 05/31/13 2130      Assessment: 80 YOF who  continues on Cefepime day # 12 for R-heel osteomyelitis. No HD since 5/1,  last cefepime dose 5/6. To get HD in the room today. Renal trying to get volume off as BP will tolerate.  Vancomycin was stopped yesterday. No staph aureus isolated from wound culture, multiple organisms present, 1/2 CoNS in blood.  Goal of Therapy:  Eradication of infection  Plan:  1. Cefepime 2g IV x1 today after HD 2. Will continue to follow HD schedule/duration, LOT of antibiotics, clinical progression, and GOC  Gricel Copen D. Jelitza Manninen, PharmD, BCPS Clinical Pharmacist Pager: [phone] 06/11/2013 12:25 PM

## 2013-06-11 NOTE — Progress Notes (Signed)
[facility] Progress Note   Subjective: BP's 90's on neo gtt, occ dip into 60's.  PC team met with family and pt remains full code, cont usual care   Filed Vitals:   06/11/13 0715 06/11/13 0730 06/11/13 0745 06/11/13 0800  BP: 96/52 97/39 95/75  96/52  Pulse: 95 96 104 90  Temp:      TempSrc:      Resp: 37 19 29 14   Height:      Weight:      SpO2: 100% 100% 100% 100%   Exam: Frail elderly female, opens eyes, quiet verbal responses No jvd Chest dec'd at bases Irregular, tachy, no RG Abd soft, NTND, +BS 2+ pitting edema of UE's and LE's Large eschar R heel, large eschar lateral aspect of R foot with dusky 5th toe; R hand wounds clean s/p I&D dorsal abcess and removal 3rd finger Neuro diffusely weak, can follow simple commands R fem Diatek cath in place  HD: MWF [place] 3.5h   3/2.5 Bath   55kg  Heparin 6300   R thigh Diatek Hect 2   Aranesp 25/wk   Venofer 50/wk tsat 56%  Assessment: 1 AMS - acute on background of chronic dementia 2 Septic shock- remains on pressors 3 Multiple wounds- R hand wound s/p I&D dorsal abcess and removal 3rd finger; also R heel large eschar and R lat foot large eschar w dusky 5th toe 4 Volume excess- diffuse edema of extremities 5 ESRD on HD x 10 yrs approx 6 Diarrhea - Cdif neg 7 Afib / ^trop 8 Access- R AVG clotted, no declot done due to vasc compromise of R hand; has new R fem Diatek 9 Anemia on aranesp 10 MBD ^Ca holding D and use low Ca bath 11 Dementia / Parkinson's 12 DM  Plan- BP a little better, will try HD today, try to pull volume as BP tolerates    Kelly Splinter MD  pager [phone]    cell [phone]  06/11/2013, 8:34 AM     Recent Labs Lab 06/09/13 0450 06/10/13 0500 06/11/13 0440  NA 137 130* 131*  K 4.0 4.0 4.3  CL 104 95* 97  CO2 21 18* 19  GLUCOSE 157* 383* 144*  BUN 17 19 24*  CREATININE 3.31* 3.47* 3.79*  CALCIUM 9.8 9.9 10.3  PHOS 2.9 3.5 3.9    Recent Labs Lab 06/09/13 0450  06/10/13 0500 06/11/13 0440  ALBUMIN 1.2* 1.2* 1.1*    Recent Labs Lab 06/05/13 0644 06/06/13 0619  WBC 14.8* 13.6*  HGB 11.3* 10.7*  HCT 34.4* 32.6*  MCV 93.0 93.4  PLT 253 237   . acidophilus  1 capsule Oral Daily  . antiseptic oral rinse  15 mL Mouth Rinse q12n4p  . carbidopa-levodopa  2 tablet Oral TID  . chlorhexidine  15 mL Mouth Rinse BID  . clopidogrel  75 mg Oral Q breakfast  . collagenase   Topical Daily  . feeding supplement (RESOURCE BREEZE)  1 Container Oral BID BM  . insulin aspart  0-5 Units Subcutaneous QHS  . insulin aspart  0-9 Units Subcutaneous TID WC  . levothyroxine  25 mcg Oral QAC breakfast  . midodrine  10 mg Oral TID WC  . multivitamin  1 tablet Oral BID  . sodium chloride  3 mL Intravenous Q12H  . sodium chloride  3 mL Intravenous Q12H   . phenylephrine (NEO-SYNEPHRINE) Adult infusion 100 mcg/min (06/11/13 0812)   sodium chloride, heparin, heparin, lidocaine (PF), lidocaine-prilocaine, metoprolol, pentafluoroprop-tetrafluoroeth, RESOURCE THICKENUP  CLEAR, sodium chloride

## 2013-06-11 NOTE — Progress Notes (Signed)
UR Completed.  Rafael Quesada Jane Tommaso Cavitt [phone] 06/11/2013  

## 2013-06-11 NOTE — Progress Notes (Signed)
Assessed pt for PIV. Right arm is restricted and left arm is edematous.  Advised the nurse that I was unable to start a PIV. Katrina Meyer

## 2013-06-12 ENCOUNTER — Inpatient Hospital Stay (HOSPITAL_COMMUNITY): Payer: Medicare Other

## 2013-06-12 DIAGNOSIS — I5022 Chronic systolic (congestive) heart failure: Secondary | ICD-10-CM

## 2013-06-12 LAB — RENAL FUNCTION PANEL
ALBUMIN: 1.5 g/dL — AB (ref 3.5–5.2)
BUN: 11 mg/dL (ref 6–23)
CALCIUM: 8.3 mg/dL — AB (ref 8.4–10.5)
CO2: 24 mEq/L (ref 19–32)
CREATININE: 2.14 mg/dL — AB (ref 0.50–1.10)
Chloride: 100 mEq/L (ref 96–112)
GFR calc Af Amer: 24 mL/min — ABNORMAL LOW (ref >90)
GFR, EST NON AFRICAN AMERICAN: 21 mL/min — AB (ref >90)
Glucose, Bld: 82 mg/dL (ref 70–99)
PHOSPHORUS: 2.6 mg/dL (ref 2.3–4.6)
Potassium: 3.3 mEq/L — ABNORMAL LOW (ref 3.7–5.3)
Sodium: 138 mEq/L (ref 137–147)

## 2013-06-12 LAB — GLUCOSE, CAPILLARY
GLUCOSE-CAPILLARY: 104 mg/dL — AB (ref 70–99)
Glucose-Capillary: 73 mg/dL (ref 70–99)
Glucose-Capillary: 98 mg/dL (ref 70–99)

## 2013-06-12 LAB — TSH: TSH: 2.84 u[IU]/mL (ref 0.350–4.500)

## 2013-06-12 MED ORDER — DEXTROSE 5 % IV SOLN
2.0000 g | Freq: Once | INTRAVENOUS | Status: AC
  Administered 2013-06-12: 2 g via INTRAVENOUS
  Filled 2013-06-12: qty 2

## 2013-06-12 MED ORDER — ALBUMIN HUMAN 25 % IV SOLN
INTRAVENOUS | Status: AC
  Administered 2013-06-12: 25 g
  Filled 2013-06-12: qty 100

## 2013-06-12 MED ORDER — BIOTENE DRY MOUTH MT LIQD
15.0000 mL | Freq: Two times a day (BID) | OROMUCOSAL | Status: DC
  Administered 2013-06-12 – 2013-06-18 (×11): 15 mL via OROMUCOSAL

## 2013-06-12 NOTE — Progress Notes (Signed)
PULMONARY / CRITICAL CARE MEDICINE   Name: Katrina Meyer MRN: [id number] DOB: 1932-04-30    ADMISSION DATE:  05/31/2013 CONSULTATION DATE:  06/07/13 REFERRING MD :  Dr. Bridgett Larsson  PRIMARY SERVICE: [facility] --> PCCM (5/4)  CHIEF COMPLAINT:  Hypotension  BRIEF PATIENT DESCRIPTION: 80 y/o F with PMH of ESRD admitted 4/27 with AMS/Hypotension.  AMS thought related to progressive Parkinson's Disease + component of vascular dementia.  Recent admit 4/9-4/25 at [facility] for R heel osteomyelitis. During that hospitalization, she underwent:   -I&D of deep abscess and third right ray amputation (05/18/13) status post repeat I&D and wound closure of right hand, 4/20.  -proximalization/revision of AVG, ligation of distal AVG, endarterectomy right brachial artery 05/17/13 by Dr. Maudie Mercury.  -PTA right ulnar 90% to 20% residual with steal phenomena and threatened hand. Wound cultures grew [place].  -right lower extremity arteriogram 05/25/13, status post mechanical thrombectomy; PTA/stent 80% AT artery to 30% et Ronney Asters. -Placement of lytic catheter during procedure with administration of lytics overnight to improve flow. DES was placed at the proximal edge of the AT stent.  .................................................................................................................................................................................................................  SIGNIFICANT EVENTS: 4/27 Admit to [facility] with AMS / Hypotension per [facility].  5/03 Tx to [facility] for evaluation by Dr. Oneida Alar and Dr. [name] 5/04 L Fem Tunneled HD placement with hypotension post-op. Vasopressors initiated.  5/05 Goals of care discussion initiated 5/06 Persistent hypotension prohibits HD. Deemed by Dr Jonnie Finner and Dr Alva Garnet to not be a candidate for CRRT 5/06 Palliative care consult requested. Plan family conference 5/07 5/07 Remains on vasopressors. No HD planned until after palliative care mtg completed.  5/07 Palliative Care mtg:  family expressing unrealistic hopes for outcome that not likely attainable and indicating a desire for continued aggressive care 5/08 Weaned off vasopressors briefly. Episode of symptomatic hypotension at initiation of HD. Phenylephrine restarted. HD completed  STUDIES:  4/27 CT head: Stable atrophy with moderate chronic microvascular ischemic disease involving the supratentorial white matter. Remote left basal ganglia infarct 4/28 MRI brain: No definite acute intracranial abnormality 4/28 Echocardiogram:  EF greater 70%, moderate LVH, unable to determine diastolic dysfunction, mild AS 4/30 MRI R foot: Skin ulceration over the lateral aspect of the right heel with underlying marrow signal abnormality in the calcaneus compatible with osteomyelitis. Negative for abscess. 5/01 EEG:  moderate generalized slowing, no epileptiform activity observed   LINES / TUBES: R fem Tunneled HD 5/4 >>   CULTURES: C-Diff 4/29 >> NEG blood 4/27 >> 1/2 coag neg staph R Foot  5/2 >> multiple organisms  ANTIBIOTICS: Zosyn 4/27>>5/1 Flagyl 5/1 >> 5/04 Vanco (MWF) 4/27 >> 5/07 Cefepime (MWF) 5/1>>   SUBJECTIVE:  Remains nonverbal. No distress.   VITAL SIGNS: Temp:  [97.1 F (36.2 C)-98.3 F (36.8 C)] 98.3 F (36.8 C) (05/09 2000) Pulse Rate:  [75-111] 103 (05/09 2000) Resp:  [12-51] 13 (05/09 2000) BP: (78-150)/(36-87) 140/64 mmHg (05/09 2000) SpO2:  [100 %] 100 % (05/09 2000) Weight:  [141 lb 5 oz (64.1 kg)-144 lb 10 oz (65.6 kg)] 141 lb 5 oz (64.1 kg) (05/09 1700)   INTAKE / OUTPUT: Intake/Output     05/09 0701 - 05/10 0700   P.O.    I.V. (mL/kg) 70 (1.1)   IV Piggyback 50   Total Intake(mL/kg) 120 (1.9)   Other 1456   Stool 1   Total Output 1457   Net -1337         PHYSICAL EXAMINATION: General: NAD Neuro: opens eyes with stimulation, non verbal HEENT: NAD Cardiovascular: IRIR,  mildly tachycardic Lungs: clear anteriorly, unlabored  Abdomen:  Soft, NT, +BS Ext; L AKA, cool, no  edema, multiple necrotic digits on R foot  LABS: CBC No results found for this basename: WBC, HGB, HCT, PLT,  in the last 72 hours  BMET Recent Labs     06/10/13  0500  06/11/13  0440  06/12/13  0500  NA  130*  131*  138  K  4.0  4.3  3.3*  CL  95*  97  100  CO2  18*  19  24  BUN  19  24*  11  CREATININE  3.47*  3.79*  2.14*  GLUCOSE  383*  144*  82    Electrolytes Recent Labs     06/10/13  0500  06/11/13  0440  06/12/13  0500  CALCIUM  9.9  10.3  8.3*  PHOS  3.5  3.9  2.6    Liver Enzymes Recent Labs     06/10/13  0500  06/11/13  0440  06/12/13  0500  ALBUMIN  1.2*  1.1*  1.5*    Glucose Recent Labs     06/11/13  1152  06/11/13  1155  06/11/13  1558  06/11/13  2007  06/12/13  0725  06/12/13  1108  GLUCAP  142*  >600*  111*  98  73  98    Imaging Dg Chest Port 1 View  06/12/2013   CLINICAL DATA:  vol excess , ESRD, ? edema  EXAM: PORTABLE CHEST - 1 VIEW  COMPARISON:  06/07/2013  FINDINGS: Femoral dialysis catheter tip mid right atrium.  . Stable mild cardiomegaly. Some increase in consolidation/atelectasis of the left lung base, with probable small effusion. Mild central pulmonary vascular congestion. Relatively low lung volumes with resultant crowding of bronchovascular structures, can't exclude mild interstitial edema. Atheromatous tortuous aorta.  Regional bones unremarkable.  IMPRESSION: 1. Some increase in asymmetric infiltrates left greater than right with possible small effusion.   Electronically Signed   By: Arne Cleveland M.D.   On: 06/12/2013 09:05       ASSESSMENT / PLAN:  VASCULAR A: Severe PVD with Critical Limb Ischemia S/P L AKA Ischemic R hand, S/P 3rd digit amputation P:  Mgmt per VVS and WOC  CARDIOVASCULAR A:  Hypotension >> resolved CAF, rate adequately controlled P:  Resume midodrine  INFECTIOUS A:   R Heel Osteomyelitis  R Foot Ulcerations (Multiple) Multiple Pressure Ulcers R Hand Infection - s/p I&D, 3rd digit  amputation 4/16 P:   Micro and abx as above  NEUROLOGIC A:   Severe baseline dementia Parkinson's Disease Hx CVA  Pain P:   Holding Aricept - little role in acute illness Continue Sinemet if able to take POs Continue analgesia PRN  RENAL A:   ESRD Hypokalemia, resolved Not a candidate for CRRT due to multitude of ES illnesses P:   HD per Renal as tolerated Monitor BMET intermittently Monitor I/Os Correct electrolytes as indicated if possible  GASTROINTESTINAL A:   Concern for Dysphagia Severe Protein Calorie Malnutrition  Diarrhea P:   SUP: N/I Continue PO nutrition as tolerated  HEMATOLOGIC A:   Mild anemia without acute blood loss P:  DVT px: SCDs Monitor CBC intermittently  ENDOCRINE A:   DM 2 Hypothyroidism   P:   Continue CBGs/SSI Continue levothyroxine   PULMONARY A: No acute issues  P:   Supp O2 as needed to maintain SpO2 > 92%   Transfer to telemetry.  Chesley Mires, MD [place]  Pulmonary/Critical Care 06/12/2013, 9:14 PM Pager:  [phone] After 3pm call: [phone]

## 2013-06-12 NOTE — Progress Notes (Signed)
ANTIBIOTIC CONSULT NOTE - FOLLOW UP  Pharmacy Consult for Cefepime Indication: R-heel osteomyelitis  Allergies  Allergen Reactions  . Hydralazine Rash  . Hydrochlorothiazide Hives and Rash    Patient Measurements: Height: 5' 6.93" (170 cm) Weight: 141 lb 5 oz (64.1 kg) IBW/kg (Calculated) : 61.44  Vital Signs: Temp: 97.1 F (36.2 C) (05/09 0752) Temp src: Oral (05/09 0752) BP: 115/65 mmHg (05/09 1000) Pulse Rate: 94 (05/09 1000) Intake/Output from previous day: 05/08 0701 - 05/09 0700 In: 944.5 [P.O.:30; I.V.:864.5; IV Piggyback:50] Out: 1111  Intake/Output from this shift: Total I/O In: 30 [I.V.:30] Out: 1 [Stool:1]  Labs:  Recent Labs  06/10/13 0500 06/11/13 0440 06/12/13 0500  CREATININE 3.47* 3.79* 2.14*   Estimated Creatinine Clearance: 21 ml/min (by C-G formula based on Cr of 2.14). No results found for this basename: VANCOTROUGH, VANCOPEAK, VANCORANDOM, [place], [place], [place], [place], [place], [place], AMIKACINPEAK, AMIKACINTROU, AMIKACIN,  in the last 72 hours   Microbiology: Recent Results (from the past 720 hour(s))  CULTURE, BLOOD (ROUTINE X 2)     Status: None   Collection Time    05/31/13  6:27 PM      Result Value Ref Range Status   Specimen Description BLOOD LEFT ANTECUBITAL   Final   Special Requests     Final   Value: BOTTLES DRAWN AEROBIC AND ANAEROBIC [place]  IMMUNE:COMPROMISED   Culture NO GROWTH 5 DAYS   Final   Report Status 06/05/2013 FINAL   Final  CULTURE, BLOOD (ROUTINE X 2)     Status: None   Collection Time    05/31/13  7:13 PM      Result Value Ref Range Status   Specimen Description BLOOD LEFT HAND   Final   Special Requests BOTTLES DRAWN AEROBIC ONLY 4CC  IMMUNE:COMPROMISED   Final   Culture  Setup Time     Final   Value: 06/03/2013 10:10     Performed at [facility]   Culture     Final   Value: STAPHYLOCOCCUS SPECIES (COAGULASE NEGATIVE)     Note: IDENTIFIED AS STAPHYLOCOCCUS LUGDUNENSIS      Note: Gram Stain Report Called to,Read Back By and Verified With: BULLINS MEGAN AT 4008 ON 06/03/13 BY Tyrone Schimke M Performed at [facility]     Performed at [facility]   Report Status 06/07/2013 FINAL   Final   Organism ID, Bacteria STAPHYLOCOCCUS SPECIES (COAGULASE NEGATIVE)   Final  CLOSTRIDIUM DIFFICILE BY PCR     Status: None   Collection Time    06/02/13  2:30 PM      Result Value Ref Range Status   C difficile by pcr NEGATIVE  NEGATIVE Final  WOUND CULTURE     Status: None   Collection Time    06/05/13  4:04 PM      Result Value Ref Range Status   Specimen Description WOUND RIGHT HEEL   Final   Special Requests IMMUNE:COMPROMISED   Final   Gram Stain     Final   Value: NO WBC SEEN     MODERATE SQUAMOUS EPITHELIAL CELLS PRESENT     ABUNDANT GRAM NEGATIVE RODS     MODERATE GRAM POSITIVE COCCI IN PAIRS     IN CLUSTERS   Culture     Final   Value: MULTIPLE ORGANISMS PRESENT, NONE PREDOMINANT     Note: NO STAPHYLOCOCCUS AUREUS ISOLATED NO GROUP A STREP (S.PYOGENES) ISOLATED     Performed at [facility]   Report  Status 06/08/2013 FINAL   Final  MRSA PCR SCREENING     Status: None   Collection Time    06/06/13  8:14 PM      Result Value Ref Range Status   MRSA by PCR NEGATIVE  NEGATIVE Final   Comment:            The GeneXpert MRSA Assay (FDA     approved for NASAL specimens     only), is one component of a     comprehensive MRSA colonization     surveillance program. It is not     intended to diagnose MRSA     infection nor to guide or     monitor treatment for     MRSA infections.    Anti-infectives   Start     Dose/Rate Route Frequency Ordered Stop   06/11/13 1700  ceFEPIme (MAXIPIME) 2 g in dextrose 5 % 50 mL IVPB     2 g 100 mL/hr over 30 Minutes Intravenous  Once 06/11/13 1228 06/11/13 1720   06/09/13 1200  ceFEPIme (MAXIPIME) 500 mg in dextrose 5 % 50 mL IVPB  Status:  Discontinued     500 mg 100 mL/hr over 30 Minutes Intravenous Every  24 hours 06/09/13 1014 06/10/13 1006   06/04/13 1400  metroNIDAZOLE (FLAGYL) tablet 250 mg  Status:  Discontinued     250 mg Oral 3 times per day 06/04/13 1031 06/08/13 1123   06/04/13 1200  ceFEPIme (MAXIPIME) 2 g in dextrose 5 % 50 mL IVPB  Status:  Discontinued     2 g 100 mL/hr over 30 Minutes Intravenous Every M-W-F (Hemodialysis) 06/04/13 1040 06/09/13 1014   06/04/13 1100  metroNIDAZOLE (FLAGYL) IVPB 250 mg  Status:  Discontinued     250 mg 50 mL/hr over 60 Minutes Intravenous Every 8 hours 06/04/13 1007 06/04/13 1031   06/03/13 1400  piperacillin-tazobactam (ZOSYN) IVPB 2.25 g  Status:  Discontinued     2.25 g 100 mL/hr over 30 Minutes Intravenous Every 8 hours 06/03/13 0911 06/04/13 1007   06/03/13 1200  piperacillin-tazobactam (ZOSYN) IVPB 3.375 g  Status:  Discontinued     3.375 g 12.5 mL/hr over 240 Minutes Intravenous Every 8 hours 06/03/13 0908 06/03/13 0910   06/02/13 1200  vancomycin (VANCOCIN) 500 mg in sodium chloride 0.9 % 100 mL IVPB  Status:  Discontinued     500 mg 100 mL/hr over 60 Minutes Intravenous Every M-W-F (Hemodialysis) 06/01/13 1652 06/10/13 1011   06/01/13 1015  levofloxacin (LEVAQUIN) IVPB 500 mg  Status:  Discontinued     500 mg 100 mL/hr over 60 Minutes Intravenous Every 24 hours 06/01/13 1010 06/01/13 1555   06/01/13 1000  levofloxacin (LEVAQUIN) tablet 500 mg  Status:  Discontinued    Comments:  Pharmacy to dose   500 mg Oral Daily 05/31/13 2234 06/01/13 1009   06/01/13 0400  piperacillin-tazobactam (ZOSYN) IVPB 2.25 g  Status:  Discontinued     2.25 g 100 mL/hr over 30 Minutes Intravenous Every 8 hours 05/31/13 2238 06/03/13 0908   05/31/13 2000  piperacillin-tazobactam (ZOSYN) IVPB 2.25 g     2.25 g 100 mL/hr over 30 Minutes Intravenous  Once 05/31/13 1936 05/31/13 2023   05/31/13 1949  piperacillin-tazobactam (ZOSYN) 3.375 GM/50ML IVPB    Comments:  Gerlene Burdock   : cabinet override      05/31/13 1949 06/01/13 0759   05/31/13 1945  vancomycin  (VANCOCIN) IVPB 1000 mg/200 mL premix  1,000 mg 200 mL/hr over 60 Minutes Intravenous  Once 05/31/13 1936 05/31/13 2130      Assessment: 35 YOF who continues on Cefepime day # 13 for R-heel osteomyelitis.  last cefepime dose 5/8. To get HD in the room today. Renal trying to get volume off as BP will tolerate.  Vancomycin was stopped 5.7 No staph aureus isolated from wound culture, multiple organisms present, 1/2 CoNS in blood.  Anticoagulation: None PTA despite hx DVT. On SCDs for VTE px  Infectious Disease:  osteo of the R-heel. Discussed with ID - likely will need 6 wks. Afebrile, WBC 13.6 << 14.8, ESRD-MWF * Recently treated with Vanc/Zosyn from 4/9-4/25 at [facility] d/t RUE ischemia >> d/ced home on LVQ.   Vanc 4/27 >> 5/7 *5/4 VR 12.4 mcg/ml >> given extra 500 mg x 1 pre-IR procedure, est level ~21.6 (appropriate pre-HD level)  Zosyn 4/27 >>5/1 LVQ 4/28 >>4/28 Cefepime 5/1 >>  Flagl 5/1 >>5/5  4/27 BCx: 1/2 CoNS (pan sens), no staph aureus isolated 4/29 CDiff PCR negative 5/2 WCx: multiple organisms  Cardiovascular: Hx DL/PVD(L-AKA)/Afib/HTN. SBP 100-110 off Neo  HR 80-110s (Afib). Midodrine TID, plavix  Endocrinology: Hx DM/hypothyroidism. A1c 6, TSH 0.231 (low) on 05/30/13, T4 wnl on 5/1 >> continued PTA dose 25 mcg, 5/8 TSH 2.8. CBGs < 150s improving, SSI  GI / Nutrition: LFTs wnl.   Neurology: Hx end-stage dementia/CVA/PD/depression and admitted after being found unresponsive by family - now back to baseline. On sinemet-IR, stopped donepezil  Nephrology: ESRD-MWF. AVF clotted >> R-femoral HD cath 5/4. HD again today.  Goal of Therapy:  Eradication of infection  Plan:  1. Cefepime 2g IV x1 today after HD 2. Will continue to follow HD schedule/duration, LOT of antibiotics, clinical progression, and GOC  Thank you for allowing pharmacy to be a part of this patients care team.  Rowe Robert Pharm.D., BCPS, AQ-Cardiology Clinical Pharmacist 06/12/2013 11:58  AM Pager: [phone] Phone: [phone]

## 2013-06-12 NOTE — Progress Notes (Signed)
  [facility] Progress Note   Subjective: off of pressors, BP's in 100-120 range  Filed Vitals:   06/12/13 0752 06/12/13 0800 06/12/13 0900 06/12/13 1000  BP:  122/53 126/69 115/65  Pulse:  75 90 94  Temp: 97.1 F (36.2 C)     TempSrc: Oral     Resp:  15 22 15   Height:      Weight:      SpO2:  100% 100% 100%   Exam: Frail elderly female, opens eyes, quiet verbal responses No jvd Chest dec'd at bases Irregular, tachy, no RG Abd soft, NTND, +BS 2+ pitting edema of UE's and LE's Pressure skin necrosis all of lateral R foot and heel, dusky 5th toe; L AKA Neuro diffusely weak, somnolent today R fem Diatek cath in place  HD: MWF [facility] 3.5h   3/2.5 Bath   55kg  Heparin 6300   R thigh Diatek Hect 2   Aranesp 25/wk   Venofer 50/wk tsat 56%  Assessment: 1 AMS - acute on background of chronic dementia 2 Septic shock- improved, now off of pressors 3 Multiple wounds- pressure dermal necrosis lateral R foot and heel, dusky 5th toe; also R hand recent I&D dorsal abcess and removal 3rd finger 4 Volume excess- 9kg up by wts 5 ESRD on HD- intermittent HD as BP tolerates, not a candidate for CRRT due to multiple chronic and severe comorbidities 6 Diarrhea - Cdif neg 7 Afib / Cleora Fleet- not candidate for intervention, medical Rx 8 Access- R AVG clotted, no declot done due to vasc compromise of R hand; has new R fem Diatek 9 Anemia low dose aranesp 25/wk 10 MBD ^Ca holding D and use low Ca bath 11 Dementia / Parkinson's 12 DM  Plan- BP a little better, will try HD again today, try to pull volume as BP tolerates    Kelly Splinter MD  pager [phone]    cell [phone]  06/12/2013, 11:32 AM     Recent Labs Lab 06/10/13 0500 06/11/13 0440 06/12/13 0500  NA 130* 131* 138  K 4.0 4.3 3.3*  CL 95* 97 100  CO2 18* 19 24  GLUCOSE 383* 144* 82  BUN 19 24* 11  CREATININE 3.47* 3.79* 2.14*  CALCIUM 9.9 10.3 8.3*  PHOS 3.5 3.9 2.6    Recent Labs Lab 06/10/13 0500  06/11/13 0440 06/12/13 0500  ALBUMIN 1.2* 1.1* 1.5*    Recent Labs Lab 06/06/13 0619  WBC 13.6*  HGB 10.7*  HCT 32.6*  MCV 93.4  PLT 237   . antiseptic oral rinse  15 mL Mouth Rinse q12n4p  . carbidopa-levodopa  2 tablet Oral TID  . chlorhexidine  15 mL Mouth Rinse BID  . clopidogrel  75 mg Oral Q breakfast  . collagenase   Topical Daily  . feeding supplement (RESOURCE BREEZE)  1 Container Oral BID BM  . insulin aspart  0-5 Units Subcutaneous QHS  . insulin aspart  0-9 Units Subcutaneous TID WC  . levothyroxine  25 mcg Oral QAC breakfast  . midodrine  10 mg Oral TID WC  . multivitamin  1 tablet Oral BID   . phenylephrine (NEO-SYNEPHRINE) Adult infusion Stopped (06/12/13 0200)   sodium chloride, sodium chloride, sodium chloride, feeding supplement (NEPRO CARB STEADY), heparin, heparin, heparin, lidocaine (PF), lidocaine-prilocaine, metoprolol, pentafluoroprop-tetrafluoroeth, RESOURCE THICKENUP CLEAR

## 2013-06-13 LAB — CBC
HCT: 28.3 % — ABNORMAL LOW (ref 36.0–46.0)
Hemoglobin: 9.5 g/dL — ABNORMAL LOW (ref 12.0–15.0)
MCH: 30.1 pg (ref 26.0–34.0)
MCHC: 33.6 g/dL (ref 30.0–36.0)
MCV: 89.6 fL (ref 78.0–100.0)
Platelets: 198 10*3/uL (ref 150–400)
RBC: 3.16 MIL/uL — ABNORMAL LOW (ref 3.87–5.11)
RDW: 17.4 % — AB (ref 11.5–15.5)
WBC: 10.5 10*3/uL (ref 4.0–10.5)

## 2013-06-13 LAB — RENAL FUNCTION PANEL
Albumin: 1.8 g/dL — ABNORMAL LOW (ref 3.5–5.2)
BUN: 7 mg/dL (ref 6–23)
CALCIUM: 8.3 mg/dL — AB (ref 8.4–10.5)
CO2: 22 mEq/L (ref 19–32)
Chloride: 103 mEq/L (ref 96–112)
Creatinine, Ser: 1.56 mg/dL — ABNORMAL HIGH (ref 0.50–1.10)
GFR calc Af Amer: 36 mL/min — ABNORMAL LOW (ref >90)
GFR, EST NON AFRICAN AMERICAN: 31 mL/min — AB (ref >90)
Glucose, Bld: 67 mg/dL — ABNORMAL LOW (ref 70–99)
Phosphorus: 2 mg/dL — ABNORMAL LOW (ref 2.3–4.6)
Potassium: 3.9 mEq/L (ref 3.7–5.3)
SODIUM: 140 meq/L (ref 137–147)

## 2013-06-13 LAB — GLUCOSE, CAPILLARY
GLUCOSE-CAPILLARY: 101 mg/dL — AB (ref 70–99)
GLUCOSE-CAPILLARY: 74 mg/dL (ref 70–99)
GLUCOSE-CAPILLARY: 99 mg/dL (ref 70–99)
Glucose-Capillary: 68 mg/dL — ABNORMAL LOW (ref 70–99)
Glucose-Capillary: 80 mg/dL (ref 70–99)

## 2013-06-13 MED ORDER — SODIUM CHLORIDE 0.9 % IV SOLN: 100.0000 mL | INTRAVENOUS | Status: DC | PRN

## 2013-06-13 MED ORDER — DEXTROSE 50 % IV SOLN
INTRAVENOUS | Status: AC
  Administered 2013-06-13: 25 mL via INTRAVENOUS
  Filled 2013-06-13: qty 50

## 2013-06-13 MED ORDER — LIDOCAINE-PRILOCAINE 2.5-2.5 % EX CREA: 1.0000 "application " | TOPICAL_CREAM | CUTANEOUS | Status: DC | PRN

## 2013-06-13 MED ORDER — ALTEPLASE 2 MG IJ SOLR: 2.0000 mg | Freq: Once | INTRAMUSCULAR | Status: AC | PRN

## 2013-06-13 MED ORDER — DARBEPOETIN ALFA-POLYSORBATE 40 MCG/0.4ML IJ SOLN
40.0000 ug | INTRAMUSCULAR | Status: DC
  Administered 2013-06-16: 40 ug via INTRAVENOUS
  Filled 2013-06-13: qty 0.4

## 2013-06-13 MED ORDER — PENTAFLUOROPROP-TETRAFLUOROETH EX AERO: 1.0000 "application " | INHALATION_SPRAY | CUTANEOUS | Status: DC | PRN

## 2013-06-13 MED ORDER — DEXTROSE 50 % IV SOLN
25.0000 mL | Freq: Once | INTRAVENOUS | Status: AC | PRN
  Administered 2013-06-13: 25 mL via INTRAVENOUS

## 2013-06-13 MED ORDER — LIDOCAINE HCL (PF) 1 % IJ SOLN: 5.0000 mL | INTRAMUSCULAR | Status: DC | PRN

## 2013-06-13 MED ORDER — NEPRO/CARBSTEADY PO LIQD: 237.0000 mL | ORAL | Status: DC | PRN

## 2013-06-13 MED ORDER — SODIUM CHLORIDE 0.9 % IV SOLN
62.5000 mg | INTRAVENOUS | Status: DC
  Filled 2013-06-13: qty 5

## 2013-06-13 MED ORDER — HEPARIN SODIUM (PORCINE) 1000 UNIT/ML DIALYSIS: 1000.0000 [IU] | INTRAMUSCULAR | Status: DC | PRN

## 2013-06-13 MED ORDER — HEPARIN SODIUM (PORCINE) 1000 UNIT/ML DIALYSIS
2500.0000 [IU] | INTRAMUSCULAR | Status: DC | PRN
  Filled 2013-06-13: qty 3

## 2013-06-13 NOTE — Progress Notes (Signed)
Subjective:   No overnight events (per caregiver), comfortable in bed  Objective: Vital signs in last 24 hours: Temp:  [97.3 F (36.3 C)-98.3 F (36.8 C)] 97.7 F (36.5 C) (05/10 0622) Pulse Rate:  [75-116] 110 (05/10 0622) Resp:  [12-23] 17 (05/10 0622) BP: (78-150)/(36-87) 138/68 mmHg (05/10 0622) SpO2:  [99 %-100 %] 100 % (05/10 0622) Weight:  [64.1 kg (141 lb 5 oz)-65.6 kg (144 lb 10 oz)] 64.1 kg (141 lb 5 oz) (05/09 1700) Weight change: 0 kg (0 lb)  Intake/Output from previous day: 05/09 0701 - 05/10 0700 In: 170 [I.V.:120; IV Piggyback:50] Out: 3893 [Stool:1]   Lab Results:  Recent Labs  06/13/13 0525  WBC 10.5  HGB 9.5*  HCT 28.3*  PLT 198   BMET:  Recent Labs  06/12/13 0500 06/13/13 0525  NA 138 140  K 3.3* 3.9  CL 100 103  CO2 24 22  GLUCOSE 82 67*  BUN 11 7  CREATININE 2.14* 1.56*  CALCIUM 8.3* 8.3*  ALBUMIN 1.5* 1.8*   No results found for this basename: PTH,  in the last 72 hours [place] Studies: No results found for this basename: IRON, TIBC, TRANSFERRIN, FERRITIN,  in the last 72 hours  Studies/Results: Dg Chest Port 1 View  06/12/2013   CLINICAL DATA:  vol excess , ESRD, ? edema  EXAM: PORTABLE CHEST - 1 VIEW  COMPARISON:  06/07/2013  FINDINGS: Femoral dialysis catheter tip mid right atrium.  . Stable mild cardiomegaly. Some increase in consolidation/atelectasis of the left lung base, with probable small effusion. Mild central pulmonary vascular congestion. Relatively low lung volumes with resultant crowding of bronchovascular structures, can't exclude mild interstitial edema. Atheromatous tortuous aorta.  Regional bones unremarkable.  IMPRESSION: 1. Some increase in asymmetric infiltrates left greater than right with possible small effusion.   Electronically Signed   By: Arne Cleveland M.D.   On: 06/12/2013 09:05   EXAM: General appearance:  Frail, alert, in no apparent distress Resp:  Decreased breath sounds, clear Cardio: Irregularly irregular, no  murmur or rub GI: + BS, soft and nontender Extremities: L AKA, boot on R foot, dressing on R hand, improving edema @ all extremities Access:  R femoral catheter  HD: MWF [facility]  3.5h 3/2.5 Bath 55kg Heparin 6300 R thigh Diatek  Hect 2 Aranesp 40/wk Venofer 50/wk  tsat 56%  Assessment/Plan: 1. AMS - acute on background of chronic dementia.  2. Septic shock - improved, off pressors.  3. Multiple wounds - pressure dermal necrosis lateral R foot and heel, dusky 5th toe; also R hand recent I&D dorsal abcess and removal 3rd finger 4/14.  4. Volume excess - 9 kg up by wts, wt 64.1 kg s/p net UF 1.5 L yesterday.  5. ESRD - HD on MWF @ [facility]; intermittent HD as BP tolerates; pt not a candidate for CRRT due to multiple chronic and severe comorbidities.  6. Diarrhea - C diff negative.  7. Afib / Cleora Fleet - not candidate for intervention, medical Rx. 8. Access - R AVG clotted, no declot done due to vasc compromise of R hand; has new R fem Diatek. 9. Anemia - Hgb 9.5, Aranesp 40 mcg on Wed (not yet given).  10. MBD - Ca 8.3 (10.1 corrected), P 2; 2Ca bath, Hectorol on hold sec to high Ca. 11. Dementia / Parkinson's  12. DM     LOS: 13 days   Katrina Meyer 06/13/2013,9:28 AM  Pt seen, examined and agree w A/P as above.  Rob  Oseph Imburgia MD pager [phone]    cell [phone] 06/13/2013, 3:23 PM

## 2013-06-13 NOTE — Progress Notes (Signed)
Transfer Note:  Arrival Method: Bed  Mental Orientation: Alert, pt non verbal not able to assess orientation  Telemetry: on Assessment: completed Skin: refer to doc flow IV: none Pain:denies Safety Measures: initiated [place] Orientation: completed Family: at bedside   Orders have been reviewed and implemented. Will continue to monitor the patient. Call light has been placed within reach and bed alarm has been activated.  [employer] BSN, RN-BC [phone]

## 2013-06-13 NOTE — Progress Notes (Signed)
PULMONARY / CRITICAL CARE MEDICINE   Name: Katrina Meyer MRN: [id number] DOB: 1932-07-09    ADMISSION DATE:  05/31/2013 CONSULTATION DATE:  06/07/13 REFERRING MD :  Dr. Bridgett Larsson  PRIMARY SERVICE: [facility] --> PCCM (5/4)  CHIEF COMPLAINT:  Hypotension  BRIEF PATIENT DESCRIPTION: 80 y/o F with PMH of ESRD admitted 4/27 with AMS/Hypotension.  AMS thought related to progressive Parkinson's Disease + component of vascular dementia.  Recent admit 4/9-4/25 at [facility] for R heel osteomyelitis. During that hospitalization, she underwent:   -I&D of deep abscess and third right ray amputation (05/18/13) status post repeat I&D and wound closure of right hand, 4/20.  -proximalization/revision of AVG, ligation of distal AVG, endarterectomy right brachial artery 05/17/13 by Dr. Maudie Mercury.  -PTA right ulnar 90% to 20% residual with steal phenomena and threatened hand. Wound cultures grew Serratia.  -right lower extremity arteriogram 05/25/13, status post mechanical thrombectomy; PTA/stent 80% AT artery to 30% et Ronney Asters. -Placement of lytic catheter during procedure with administration of lytics overnight to improve flow. DES was placed at the proximal edge of the AT stent.  .................................................................................................................................................................................................................  SIGNIFICANT EVENTS: 4/27 Admit to [facility] with AMS / Hypotension per [facility].  5/03 Tx to [facility] for evaluation by Dr. Oneida Alar and Dr. [name] 5/04 L Fem Tunneled HD placement with hypotension post-op. Vasopressors initiated.  5/05 Goals of care discussion initiated 5/06 Persistent hypotension prohibits HD. Deemed by Dr Jonnie Finner and Dr Alva Garnet to not be a candidate for CRRT 5/06 Palliative care consult requested. Plan family conference 5/07 5/07 Remains on vasopressors. No HD planned until after palliative care mtg completed.  5/07 Palliative Care mtg:  family expressing unrealistic hopes for outcome that not likely attainable and indicating a desire for continued aggressive care 5/08 Weaned off vasopressors briefly. Episode of symptomatic hypotension at initiation of HD. Phenylephrine restarted. HD completed  STUDIES:  4/27 CT head: Stable atrophy with moderate chronic microvascular ischemic disease involving the supratentorial white matter. Remote left basal ganglia infarct 4/28 MRI brain: No definite acute intracranial abnormality 4/28 Echocardiogram:  EF greater 70%, moderate LVH, unable to determine diastolic dysfunction, mild AS 4/30 MRI R foot: Skin ulceration over the lateral aspect of the right heel with underlying marrow signal abnormality in the calcaneus compatible with osteomyelitis. Negative for abscess. 5/01 EEG:  moderate generalized slowing, no epileptiform activity observed   LINES / TUBES: R fem Tunneled HD 5/4 >>   CULTURES: C-Diff 4/29 >> NEG blood 4/27 >> 1/2 coag neg staph R Foot  5/2 >> multiple organisms  ANTIBIOTICS: Zosyn 4/27>>5/1 Flagyl 5/1 >> 5/04 Vanco (MWF) 4/27 >> 5/07 Cefepime (MWF) 5/1>>   SUBJECTIVE:  arousable but nonverbal.  No increased wob.  VITAL SIGNS: Temp:  [97.3 F (36.3 C)-98.3 F (36.8 C)] 97.7 F (36.5 C) (05/10 1004) Pulse Rate:  [75-116] 92 (05/10 1004) Resp:  [12-23] 17 (05/10 1004) BP: (78-150)/(36-95) 143/95 mmHg (05/10 1004) SpO2:  [99 %-100 %] 100 % (05/10 1004) Weight:  [64.1 kg (141 lb 5 oz)-65.6 kg (144 lb 10 oz)] 64.1 kg (141 lb 5 oz) (05/09 1700)   INTAKE / OUTPUT: Intake/Output     05/09 0701 - 05/10 0700 05/10 0701 - 05/11 0700   P.O.     I.V. (mL/kg) 120 (1.9)    IV Piggyback 50    Total Intake(mL/kg) 170 (2.7)    Other 1456    Stool 1    Total Output 1457     Net -1287  Stool Occurrence 3 x      PHYSICAL EXAMINATION: General: ow female in nad Neuro: opens eyes with stimulation, non verbal, withdraws to pain HEENT: nose without purulence  or d/c noted, neck without LN or TMG Cardiovascular: irreg rhythm, CVR today, 2/6 sem Lungs: poor depth of inspiration, but clear Abdomen:  Soft, NT, +BS Ext; L AKA, right foot in boot, mild edema  LABS: CBC Recent Labs     06/13/13  0525  WBC  10.5  HGB  9.5*  HCT  28.3*  PLT  198    BMET Recent Labs     06/11/13  0440  06/12/13  0500  06/13/13  0525  NA  131*  138  140  K  4.3  3.3*  3.9  CL  97  100  103  CO2  19  24  22   BUN  24*  11  7  CREATININE  3.79*  2.14*  1.56*  GLUCOSE  144*  82  67*    Electrolytes Recent Labs     06/11/13  0440  06/12/13  0500  06/13/13  0525  CALCIUM  10.3  8.3*  8.3*  PHOS  3.9  2.6  2.0*    Liver Enzymes Recent Labs     06/11/13  0440  06/12/13  0500  06/13/13  0525  ALBUMIN  1.1*  1.5*  1.8*    Glucose Recent Labs     06/11/13  2007  06/12/13  0725  06/12/13  1108  06/12/13  2202  06/13/13  0038  06/13/13  0826  GLUCAP  98  73  98  104*  74  68*    Imaging Dg Chest Port 1 View  06/12/2013   CLINICAL DATA:  vol excess , ESRD, ? edema  EXAM: PORTABLE CHEST - 1 VIEW  COMPARISON:  06/07/2013  FINDINGS: Femoral dialysis catheter tip mid right atrium.  . Stable mild cardiomegaly. Some increase in consolidation/atelectasis of the left lung base, with probable small effusion. Mild central pulmonary vascular congestion. Relatively low lung volumes with resultant crowding of bronchovascular structures, can't exclude mild interstitial edema. Atheromatous tortuous aorta.  Regional bones unremarkable.  IMPRESSION: 1. Some increase in asymmetric infiltrates left greater than right with possible small effusion.   Electronically Signed   By: Arne Cleveland M.D.   On: 06/12/2013 09:05       ASSESSMENT / PLAN:  VASCULAR A: Severe PVD with Critical Limb Ischemia S/P L AKA Ischemic R hand, S/P 3rd digit amputation P:  Mgmt per VVS and WOC  CARDIOVASCULAR A:  Hypotension >> resolved CAF, rate adequately controlled P:   Continue midodrine  INFECTIOUS A:   R Heel Osteomyelitis  R Foot Ulcerations (Multiple) Multiple Pressure Ulcers R Hand Infection - s/p I&D, 3rd digit amputation 4/16 P:   Micro and abx as above  NEUROLOGIC A:   Severe baseline dementia Parkinson's Disease Hx CVA  Pain P:   Holding Aricept - little role in acute illness Continue Sinemet if able to take POs Continue analgesia PRN  RENAL A:   ESRD Hypokalemia, resolved Not a candidate for CRRT due to multitude of ES illnesses P:   HD per Renal as tolerated  GASTROINTESTINAL A:   Concern for Dysphagia Severe Protein Calorie Malnutrition  Diarrhea P:   SUP: N/I Continue PO nutrition as tolerated  HEMATOLOGIC A:   Mild anemia without acute blood loss P:  DVT px: SCDs Monitor CBC intermittently  ENDOCRINE A:  DM 2 Hypothyroidism   P:   Continue CBGs/SSI Continue levothyroxine

## 2013-06-14 LAB — GLUCOSE, CAPILLARY
GLUCOSE-CAPILLARY: 133 mg/dL — AB (ref 70–99)
Glucose-Capillary: 56 mg/dL — ABNORMAL LOW (ref 70–99)
Glucose-Capillary: 63 mg/dL — ABNORMAL LOW (ref 70–99)
Glucose-Capillary: 147 mg/dL — ABNORMAL HIGH (ref 70–99)

## 2013-06-14 LAB — RENAL FUNCTION PANEL
Albumin: 1.7 g/dL — ABNORMAL LOW (ref 3.5–5.2)
BUN: 9 mg/dL (ref 6–23)
CALCIUM: 9 mg/dL (ref 8.4–10.5)
CO2: 25 meq/L (ref 19–32)
CREATININE: 2.11 mg/dL — AB (ref 0.50–1.10)
Chloride: 101 mEq/L (ref 96–112)
GFR, EST AFRICAN AMERICAN: 25 mL/min — AB (ref >90)
GFR, EST NON AFRICAN AMERICAN: 21 mL/min — AB (ref >90)
Glucose, Bld: 62 mg/dL — ABNORMAL LOW (ref 70–99)
Phosphorus: 2.7 mg/dL (ref 2.3–4.6)
Potassium: 4.1 mEq/L (ref 3.7–5.3)
Sodium: 137 mEq/L (ref 137–147)

## 2013-06-14 LAB — CBC
HCT: 28.1 % — ABNORMAL LOW (ref 36.0–46.0)
HEMOGLOBIN: 9.6 g/dL — AB (ref 12.0–15.0)
MCH: 30.7 pg (ref 26.0–34.0)
MCHC: 34.2 g/dL (ref 30.0–36.0)
MCV: 89.8 fL (ref 78.0–100.0)
Platelets: 240 10*3/uL (ref 150–400)
RBC: 3.13 MIL/uL — ABNORMAL LOW (ref 3.87–5.11)
RDW: 17.6 % — AB (ref 11.5–15.5)
WBC: 9.3 10*3/uL (ref 4.0–10.5)

## 2013-06-14 MED ORDER — ALTEPLASE 2 MG IJ SOLR
2.0000 mg | Freq: Once | INTRAMUSCULAR | Status: DC | PRN
  Filled 2013-06-14: qty 2

## 2013-06-14 MED ORDER — METOPROLOL TARTRATE 1 MG/ML IV SOLN
5.0000 mg | Freq: Four times a day (QID) | INTRAVENOUS | Status: DC
  Filled 2013-06-14 (×5): qty 5

## 2013-06-14 MED ORDER — LIDOCAINE-PRILOCAINE 2.5-2.5 % EX CREA: 1.0000 "application " | TOPICAL_CREAM | CUTANEOUS | Status: DC | PRN

## 2013-06-14 MED ORDER — LIDOCAINE HCL (PF) 1 % IJ SOLN: 5.0000 mL | INTRAMUSCULAR | Status: DC | PRN

## 2013-06-14 MED ORDER — PENTAFLUOROPROP-TETRAFLUOROETH EX AERO: 1.0000 "application " | INHALATION_SPRAY | CUTANEOUS | Status: DC | PRN

## 2013-06-14 MED ORDER — DEXTROSE 50 % IV SOLN
INTRAVENOUS | Status: AC
  Filled 2013-06-14: qty 50

## 2013-06-14 MED ORDER — NEPRO/CARBSTEADY PO LIQD: 237.0000 mL | ORAL | Status: DC | PRN

## 2013-06-14 MED ORDER — BISOPROLOL FUMARATE 5 MG PO TABS
2.5000 mg | ORAL_TABLET | Freq: Two times a day (BID) | ORAL | Status: DC
  Administered 2013-06-14 – 2013-06-17 (×6): 2.5 mg via ORAL
  Administered 2013-06-17: 12:00:00 via ORAL
  Filled 2013-06-14 (×9): qty 0.5

## 2013-06-14 MED ORDER — HEPARIN SODIUM (PORCINE) 1000 UNIT/ML DIALYSIS: 1000.0000 [IU] | INTRAMUSCULAR | Status: DC | PRN

## 2013-06-14 MED ORDER — SODIUM CHLORIDE 0.9 % IV SOLN: 100.0000 mL | INTRAVENOUS | Status: DC | PRN

## 2013-06-14 MED ORDER — DEXTROSE 50 % IV SOLN: 25.0000 mL | Freq: Once | INTRAVENOUS | Status: AC | PRN

## 2013-06-14 MED ORDER — DEXTROSE 5 % IV SOLN
2.0000 g | INTRAVENOUS | Status: DC
  Administered 2013-06-16 – 2013-06-18 (×2): 2 g via INTRAVENOUS
  Filled 2013-06-14 (×6): qty 2

## 2013-06-14 MED ORDER — HEPARIN SODIUM (PORCINE) 1000 UNIT/ML DIALYSIS
6300.0000 [IU] | Freq: Once | INTRAMUSCULAR | Status: DC
  Filled 2013-06-14: qty 7

## 2013-06-14 NOTE — Progress Notes (Signed)
PULMONARY / CRITICAL CARE MEDICINE  Name: Katrina Meyer MRN: [id number] DOB: 1932/05/18    ADMISSION DATE:  05/31/2013 CONSULTATION DATE:  06/07/2013  INTERVAL HISTORY:  Tachycardic after HD.  VITAL SIGNS: Temp:  [97.7 F (36.5 C)-98 F (36.7 C)] 97.7 F (36.5 C) (05/11 0807) Pulse Rate:  [98-119] 115 (05/11 1003) Resp:  [16-18] 18 (05/11 0807) BP: (87-146)/(45-85) 104/50 mmHg (05/11 1003) SpO2:  [96 %-100 %] 100 % (05/11 0807) Weight:  [63.3 kg (139 lb 8.8 oz)-65.4 kg (144 lb 2.9 oz)] 63.3 kg (139 lb 8.8 oz) (05/11 0807)  INTAKE / OUTPUT: Intake/Output     05/10 0701 - 05/11 0700 05/11 0701 - 05/12 0700   I.V. (mL/kg)     IV Piggyback     Total Intake(mL/kg)     Other     Stool     Total Output       Net            Stool Occurrence 1 x      PHYSICAL EXAMINATION: General: female in nad Neuro: opens eyes with stimulation, non verbal, withdraws to pain. Blinks eyes to command HEENT: nose without purulence or d/c noted, neck without LN or TMG Cardiovascular: irreg rhythm, CVR today, 2/6 sem Lungs: poor depth of inspiration, but clear Abdomen:  Soft, NT, +BS Ext; L AKA, right foot in boot, mild edema. Rt fem diatek cath  LABS: CBC  Recent Labs Lab 06/13/13 0525 06/14/13 0830  WBC 10.5 9.3  HGB 9.5* 9.6*  HCT 28.3* 28.1*  PLT 198 240   Coag's No results found for this basename: APTT, INR,  in the last 168 hours BMET  Recent Labs Lab 06/12/13 0500 06/13/13 0525 06/14/13 0830  NA 138 140 137  K 3.3* 3.9 4.1  CL 100 103 101  CO2 24 22 25   BUN 11 7 9   CREATININE 2.14* 1.56* 2.11*  GLUCOSE 82 67* 62*   Electrolytes  Recent Labs Lab 06/12/13 0500 06/13/13 0525 06/14/13 0830  CALCIUM 8.3* 8.3* 9.0  PHOS 2.6 2.0* 2.7   Sepsis Markers No results found for this basename: LATICACIDVEN, PROCALCITON, O2SATVEN,  in the last 168 hours ABG No results found for this basename: PHART, PCO2ART, PO2ART,  in the last 168 hours Liver Enzymes  Recent Labs Lab  06/12/13 0500 06/13/13 0525 06/14/13 0830  ALBUMIN 1.5* 1.8* 1.7*   Cardiac Enzymes  Recent Labs Lab 06/07/13 1920 06/07/13 2317 06/08/13 0550  TROPONINI <0.30 <0.30 <0.30   Glucose  Recent Labs Lab 06/13/13 1157 06/13/13 1654 06/13/13 2032 06/14/13 0744 06/14/13 1302 06/14/13 1513  GLUCAP 99 80 101* 56* 63* 133*   IMAGING:  No results found.  ASSESSMENT / PLAN:  Severe PVD with critical limb ischemia S/P L AKA Ischemic R hand, s/p 3rd digit amputation  Mgmt per VVS and WOC  Plavix  Hypotension, chronic?  Continue Midodrine  AF-RVR, rate adequately controlled  Metoprolol, change to q6h scheduled  Zebeta  R heel osteomyelitis  R foot ulcerations Multiple pressure ulcers R hand infection  Cefepime  Severe baseline dementia Parkinson's Disease Hx CVA  Pain  Holding Aricept - little role in acute illness  Continue Sinemet if able to take POs  Continue analgesia PRN  ESRD  Not a candidate for CRRT due to multitude of ES illnesses  HD per Renal as tolerated  DM 2  SSI  Hypothyroidism    Levothyroxine   TRH to assume care 5/12.  Richardson Landry Minor ACNP [facility] PCCM  Pager [phone] till 3 pm If no answer page [phone] 06/14/2013, 11:17 AM  I have personally obtained history, examined patient, evaluated and interpreted laboratory and imaging results, reviewed medical records, formulated assessment / plan and placed orders.  Doree Fudge, MD Pulmonary and [place] Pager: [phone]  06/14/2013, 4:48 PM

## 2013-06-14 NOTE — Procedures (Signed)
I have seen and examined this patient and agree with the plan of care . Seen on dialysis no complaints Sherril Croon 06/14/2013, 9:34 AM

## 2013-06-14 NOTE — Progress Notes (Signed)
Name: Katrina Meyer MRN: [id number] DOB: [birth year]  ELECTRONIC ICU PHYSICIAN NOTE  Problem:  CAF with acceleration to 140  Intervention:  Add bisoprolol 2.5 mg bid if bp tolerates  Tanda Rockers 06/14/2013, 3:45 PM

## 2013-06-14 NOTE — Progress Notes (Signed)
Pt with repeated episodes of afib with HR in 140s during HD, returned to unit. Pt continues to have episodes once returned to unit. MD paged at [phone]. Will continue to monitor.

## 2013-06-14 NOTE — Progress Notes (Signed)
Arrived to start a PIV. Pt is not a candidate for PIV due to having 4+ edema in the left arm and the right arm is restricted.  RN notified of my assessment, no new orders given. Katrina Meyer

## 2013-06-14 NOTE — Progress Notes (Signed)
Inpatient Diabetes Program Recommendations  AACE/ADA: New Consensus Statement on Inpatient Glycemic Control (2013)  Target Ranges:  Prepandial:   less than 140 mg/dL      Peak postprandial:   less than 180 mg/dL (1-2 hours)      Critically ill patients:  140 - 180 mg/dL   Results for Katrina Meyer, Katrina Meyer (MRN [id number]) as of 06/14/2013 09:47  Ref. Range 06/12/2013 07:25 06/12/2013 11:08 06/12/2013 22:02  Glucose-Capillary Latest Range: 70-99 mg/dL 73 98 104 (H)  Results for Katrina Meyer, Katrina Meyer (MRN [id number]) as of 06/14/2013 09:47  Ref. Range 06/12/2013 22:02 06/13/2013 00:38 06/13/2013 08:26 06/13/2013 11:57 06/13/2013 16:54 06/13/2013 20:32 06/14/2013 07:44  Glucose-Capillary Latest Range: 70-99 mg/dL 104 (H) 74 68 (L) 99 80 101 (H) 56 (L)   Diabetes history: DM2  Outpatient Diabetes medications: Lantus 1-5 units QHS (sliding scale), Humalog 1-4 units TID with meals  Current orders for Inpatient glycemic control: Novolog 0-9 units AC, Novolog 0-5 units HS   Inpatient Diabetes Program Recommendations  IV fluids: Noted patient has been hypoglycemic the past two mornings.  May want to consider ordering IVF with dextrose if hypoglycemia persist and if appropriate for patient situation.  Note: Patient noted to have CBG of 56mg /dl this am at 7:44 am. Patient has not received any insulin since 06/11/13.  May want to consider ordering IV fluids with dextrose if hypoglycemia persist and if appropriate for patient situation.   Thanks, Barnie Alderman, RN, MSN, CCRN Diabetes Coordinator Inpatient Diabetes Program [phone] (Team Pager) [phone] (AP office) [phone] [facility] office)

## 2013-06-14 NOTE — Progress Notes (Signed)
Hypoglycemic Event  CBG: 63  Treatment: 15 GM carbohydrate snack  Symptoms: None  Follow-up CBG: Time:1500 CBG Result:133  Possible Reasons for Event: Inadequate meal intake  Comments/MD notified:n/a    Katrina Meyer  Remember to initiate Hypoglycemia Order Set & complete

## 2013-06-14 NOTE — Progress Notes (Signed)
Spoke with Warren Lacy MD regarding pt status/increased HR/no PIV. MD is aware and will review chart. Pt HR sustaining in the 140s, sbp in the 90s. Difficult to assess neuro status as pt is non-verbal. CBG recheck 133. Will continue to monitor.

## 2013-06-14 NOTE — Progress Notes (Signed)
Hypoglycemic Event  CBG: 56  Treatment: 15 GM carbohydrate snack  Symptoms: None  Follow-up CBG: [id number] CBG Result: 63   Possible Reasons for Event: Inadequate meal intake  Comments/MD notified: Pt taken to HD prior to result, HD RN notified of CBG.    Lovette Cliche  Remember to initiate Hypoglycemia Order Set & complete

## 2013-06-14 NOTE — Consult Note (Signed)
WOC wound follow up Wound type: multiple wounds on the RLE, R hand and sacrum.  She has had vascular procedure at outside facility and right middle finger amputation at same site.  1. Sacrum improving, she has 4 open areas over her sacrum, one on the right upper sacrum has 80% yellow slough but very thin layer.  All wounds appear partial thickness and clean 2. Right foot/right heel: now appears to have new areas that are darkened on the right medial foot 9cm x 2cm x 0.  Has stable heel eschar and stable lateral right foot eschar.  Toe tip ulcers, eschar (2nd and 3rd toes). Heel noted to have a very little drainage, will dc the use of foam to see if we can keep stable.  3. Right hand eschar of the medial and lateral hand, some yellow drainage at the center (at amputation site).she is reluctant to let you clean between the fingers.  4. Bilateral buttocks with partial thickness skin loss from MASD, it is fairly extensive and staff was using foam dressing to cover.  I have since dced the foam as it traps the incontinence under these dressing and implemented barrier cream instead.  Added air mattress for moisture management and for pressure redistribution.   WOC will follow along with you for wound assessment weekly.  If any of her wounds change please re-consult. Shadee Montoya [place] RN,CWOCN [phone]

## 2013-06-15 ENCOUNTER — Ambulatory Visit: Payer: Medicare Other | Admitting: Cardiovascular Disease

## 2013-06-15 DIAGNOSIS — R6521 Severe sepsis with septic shock: Secondary | ICD-10-CM

## 2013-06-15 DIAGNOSIS — D72829 Elevated white blood cell count, unspecified: Secondary | ICD-10-CM

## 2013-06-15 DIAGNOSIS — A419 Sepsis, unspecified organism: Secondary | ICD-10-CM

## 2013-06-15 DIAGNOSIS — E785 Hyperlipidemia, unspecified: Secondary | ICD-10-CM

## 2013-06-15 LAB — GLUCOSE, CAPILLARY
GLUCOSE-CAPILLARY: 126 mg/dL — AB (ref 70–99)
Glucose-Capillary: 79 mg/dL (ref 70–99)
Glucose-Capillary: 93 mg/dL (ref 70–99)
Glucose-Capillary: 251 mg/dL — ABNORMAL HIGH (ref 70–99)
Glucose-Capillary: 134 mg/dL — ABNORMAL HIGH (ref 70–99)
Glucose-Capillary: 206 mg/dL — ABNORMAL HIGH (ref 70–99)
Glucose-Capillary: 110 mg/dL — ABNORMAL HIGH (ref 70–99)

## 2013-06-15 LAB — RENAL FUNCTION PANEL
Albumin: 1.7 g/dL — ABNORMAL LOW (ref 3.5–5.2)
BUN: 5 mg/dL — ABNORMAL LOW (ref 6–23)
CALCIUM: 8.9 mg/dL (ref 8.4–10.5)
CO2: 23 mEq/L (ref 19–32)
CREATININE: 1.3 mg/dL — AB (ref 0.50–1.10)
Chloride: 101 mEq/L (ref 96–112)
GFR calc Af Amer: 44 mL/min — ABNORMAL LOW (ref >90)
GFR, EST NON AFRICAN AMERICAN: 38 mL/min — AB (ref >90)
GLUCOSE: 125 mg/dL — AB (ref 70–99)
Phosphorus: 2 mg/dL — ABNORMAL LOW (ref 2.3–4.6)
Potassium: 4.5 mEq/L (ref 3.7–5.3)
Sodium: 137 mEq/L (ref 137–147)

## 2013-06-15 NOTE — Progress Notes (Signed)
NUTRITION FOLLOW UP  DOCUMENTATION CODES  Per approved criteria   -Severe malnutrition in the context of chronic illness    Pt meets criteria for severe MALNUTRITION in the context of chronic illness as evidenced by <75% energy intake x 1 month, 21.9% wt loss x 1 year.  Intervention:   Continue Dysphagia 2 diet per SLP's discretion. Recommend SLP re-evaluate pt to assess if pt can be upgraded, as family is providing foods such as fried fish sandwiches and french fries. Continue Resource Breeze BID. RD to continue to follow nutrition care plan.  Nutrition Dx:   Inadequate oral intake related to decreased responsiveness as evidenced by minimal intake per family report  Goal:   Pt will meet >90% of estimated nutritional needs; goal not met  Monitor:   PO intake, labs, skin assessments, I/O's  Assessment:   Pt admitted to [facility] on 4/27 for acute encephalopathy. PMHx includes ESRD, DM2, PAD, advanced dementia, advanced Parkinson's disease. R heel osteomyelitis was confirmed by MRI during this admission.  Per chart review, pt was started on Dysphagia 2 diet by MD on 5/2. Daughter was asking that Nepro Shakes be discontinued because they cause the patient to have diarrhea, pt was changed to [employer] by MD.  Pt developed clotted fistula on 5/3, transferred to [facility] on same day.  She also drinks at least 2 [employer] each day and if she is not eating well that day family will give her more. Family at bedside reports that pt ate 100% of breakfast and is currently eating a Filet O Fish and french fries from [employer].  Palliative care met with family on 5/8 - family wants pt to remain full code.  Potassium WNL  Phosphorus low at 2.0 CBG: 126 - 251  Height: Ht Readings from Last 1 Encounters:  06/06/13 5' 6.93" (1.7 m)    Weight Status:   Wt Readings from Last 1 Encounters:  06/15/13 138 lb (62.596 kg)  62.5 kg s/p HD on 5/12  SBW: 58.5 kg  Re-estimated needs:  Kcal:  1900-2100 daily Protein: > 87 grams daily Fluid: 1-1.5 L daily  Skin:  Stage II pressure ulcer right sacrum Stage I pressure ulcer left sacrum  Unstageable pressure ulcer mid coccyx Unstageable pressure ulcer right heel Unstageable pressure ulcer right ankle  Surgical incision right hand  Surgical incision right groin  Surgical incision right neck   Diet Order: Dysphagia 2 with thin liquids  No intake or output data in the 24 hours ending 06/15/13 1236  Last BM: 5/11   Labs:   Recent Labs Lab 06/13/13 0525 06/14/13 0830 06/15/13 0730  NA 140 137 137  K 3.9 4.1 4.5  CL 103 101 101  CO2 22 25 23   BUN 7 9 5*  CREATININE 1.56* 2.11* 1.30*  CALCIUM 8.3* 9.0 8.9  PHOS 2.0* 2.7 2.0*  GLUCOSE 67* 62* 125*    CBG (last 3)   Recent Labs  06/14/13 1718 06/14/13 2203 06/15/13 0753  GLUCAP 147* 251* 126*    Scheduled Meds: . antiseptic oral rinse  15 mL Mouth Rinse BID  . bisoprolol  2.5 mg Oral BID  . carbidopa-levodopa  2 tablet Oral TID  . ceFEPime (MAXIPIME) IV  2 g Intravenous Q M,W,F-HD  . clopidogrel  75 mg Oral Q breakfast  . collagenase   Topical Daily  . [START ON 06/16/2013] darbepoetin (ARANESP) injection - DIALYSIS  40 mcg Intravenous Q Wed-HD  . feeding supplement (RESOURCE BREEZE)  1 Container  Oral BID BM  . [START ON 06/16/2013] ferric gluconate (FERRLECIT/NULECIT) IV  62.5 mg Intravenous Weekly  . insulin aspart  0-5 Units Subcutaneous QHS  . insulin aspart  0-9 Units Subcutaneous TID WC  . levothyroxine  25 mcg Oral QAC breakfast  . midodrine  10 mg Oral TID WC  . multivitamin  1 tablet Oral BID    Continuous Infusions:    Inda Coke MS, RD, LDN Inpatient Registered Dietitian Pager: [phone] After-hours pager: [phone]

## 2013-06-15 NOTE — Progress Notes (Signed)
Subjective:   Resting in bed, no complaints (per caregiver)  Objective Filed Vitals:   06/15/13 0018 06/15/13 0459 06/15/13 0514 06/15/13 0755  BP: 114/68  133/74 121/100  Pulse: 113  104 98  Temp:   98.6 F (37 C) 97.7 F (36.5 C)  TempSrc:   Oral Oral  Resp:   18 19  Height:      Weight:  62.596 kg (138 lb)    SpO2:   100% 97%   Physical Exam General: Alert, no acute distress. nonverbal Heart: irregular. No murmur Lungs: clear, diminished, unlabored Abdomen: soft, nontender +BS Extremities: KL AKA, R foot dressing. R hand dressing Dialysis Access:  R Fem cath  HD: MWF [facility]  3.5h 3/2.5 Bath 55kg Heparin 6300 R thigh Diatek  Hect 2 Aranesp 40/wk Venofer 50/wk  tsat 56%   Assessment/Plan:  1. AMS - acute on background of chronic dementia.  2. Septic shock - improved, off pressors. Cont cefepime 3. Multiple wounds - pressure dermal necrosis lateral R foot and heel, dusky 5th toe; also R hand recent I&D dorsal abcess and removal 3rd finger 4/14.  4. Volume excess - 7 kg up by wts. 1.5L off yesterday 5. ESRD - HD on MWF @ [facility]; HD pending tomorrow K+4.5  6. Diarrhea - C diff negative.  7. Afib / Cleora Fleet - not candidate for intervention, medical Rx. 8. Access - R AVG clotted, no declot done due to vasc compromise of R hand; has new R fem Diatek. 9. Anemia - Hgb 9.6, Aranesp 40 mcg on Wed (not yet given).  10. MBD - Ca 8.9 (10.7 corrected), P 2; 2Ca bath, Hectorol on hold sec to high Ca. 11. Dementia / Parkinson's  12. DM   Shelle Iron, NP [facility] Beeper [phone] 06/15/2013,8:54 AM  LOS: 15 days    Additional Objective Labs: Basic Metabolic Panel:  Recent Labs Lab 06/12/13 0500 06/13/13 0525 06/14/13 0830  NA 138 140 137  K 3.3* 3.9 4.1  CL 100 103 101  CO2 24 22 25   GLUCOSE 82 67* 62*  BUN 11 7 9   CREATININE 2.14* 1.56* 2.11*  CALCIUM 8.3* 8.3* 9.0  PHOS 2.6 2.0* 2.7   Liver Function Tests:  Recent Labs Lab 06/12/13 0500  06/13/13 0525 06/14/13 0830  ALBUMIN 1.5* 1.8* 1.7*   No results found for this basename: LIPASE, AMYLASE,  in the last 168 hours CBC:  Recent Labs Lab 06/13/13 0525 06/14/13 0830  WBC 10.5 9.3  HGB 9.5* 9.6*  HCT 28.3* 28.1*  MCV 89.6 89.8  PLT 198 240   Blood Culture    Component Value Date/Time   SDES WOUND RIGHT HEEL 06/05/2013 1604   SPECREQUEST IMMUNE:COMPROMISED 06/05/2013 1604   CULT  Value: MULTIPLE ORGANISMS PRESENT, NONE PREDOMINANT Note: NO STAPHYLOCOCCUS AUREUS ISOLATED NO GROUP A STREP (S.PYOGENES) ISOLATED Performed at [facility] 06/05/2013 1604   REPTSTATUS 06/08/2013 FINAL 06/05/2013 1604    Cardiac Enzymes: No results found for this basename: CKTOTAL, CKMB, CKMBINDEX, TROPONINI,  in the last 168 hours CBG:  Recent Labs Lab 06/14/13 1302 06/14/13 1513 06/14/13 1718 06/14/13 2203 06/15/13 0753  GLUCAP 63* 133* 147* 251* 126*   Iron Studies: No results found for this basename: IRON, TIBC, TRANSFERRIN, FERRITIN,  in the last 72 hours @lablastinr3 @ Studies/Results: No results found. Medications:   . antiseptic oral rinse  15 mL Mouth Rinse BID  . bisoprolol  2.5 mg Oral BID  . carbidopa-levodopa  2 tablet Oral TID  .  ceFEPime (MAXIPIME) IV  2 g Intravenous Q M,W,F-HD  . clopidogrel  75 mg Oral Q breakfast  . collagenase   Topical Daily  . [START ON 06/16/2013] darbepoetin (ARANESP) injection - DIALYSIS  40 mcg Intravenous Q Wed-HD  . feeding supplement (RESOURCE BREEZE)  1 Container Oral BID BM  . [START ON 06/16/2013] ferric gluconate (FERRLECIT/NULECIT) IV  62.5 mg Intravenous Weekly  . insulin aspart  0-5 Units Subcutaneous QHS  . insulin aspart  0-9 Units Subcutaneous TID WC  . levothyroxine  25 mcg Oral QAC breakfast  . metoprolol  5 mg Intravenous 4 times per day  . midodrine  10 mg Oral TID WC  . multivitamin  1 tablet Oral BID

## 2013-06-15 NOTE — Progress Notes (Signed)
ANTIBIOTIC CONSULT NOTE - FOLLOW UP  Pharmacy Consult for Cefepime Indication: R heel osteomyelitis  Allergies  Allergen Reactions  . Hydralazine Rash  . Hydrochlorothiazide Hives and Rash    Patient Measurements: Height: 5' 6.93" (170 cm) Weight: 138 lb (62.596 kg) IBW/kg (Calculated) : 61.44  Vital Signs: Temp: 97.7 F (36.5 C) (05/12 1203) Temp src: Oral (05/12 1203) BP: 110/74 mmHg (05/12 1203) Pulse Rate: 104 (05/12 1203) Intake/Output from previous day: 05/11 0701 - 05/12 0700 In: -  Out: 1550  Intake/Output from this shift:    Labs:  Recent Labs  06/13/13 0525 06/14/13 0830 06/15/13 0730  WBC 10.5 9.3  --   HGB 9.5* 9.6*  --   PLT 198 240  --   CREATININE 1.56* 2.11* 1.30*   Estimated Creatinine Clearance: 34.6 ml/min (by C-G formula based on Cr of 1.3). No results found for this basename: VANCOTROUGH, VANCOPEAK, VANCORANDOM, [place], [place], [place], [place], [place], TOBRARND, AMIKACINPEAK, AMIKACINTROU, AMIKACIN,  in the last 72 hours   Microbiology: Recent Results (from the past 720 hour(s))  CULTURE, BLOOD (ROUTINE X 2)     Status: None   Collection Time    05/31/13  6:27 PM      Result Value Ref Range Status   Specimen Description BLOOD LEFT ANTECUBITAL   Final   Special Requests     Final   Value: BOTTLES DRAWN AEROBIC AND ANAEROBIC [place]  IMMUNE:COMPROMISED   Culture NO GROWTH 5 DAYS   Final   Report Status 06/05/2013 FINAL   Final  CULTURE, BLOOD (ROUTINE X 2)     Status: None   Collection Time    05/31/13  7:13 PM      Result Value Ref Range Status   Specimen Description BLOOD LEFT HAND   Final   Special Requests BOTTLES DRAWN AEROBIC ONLY 4CC  IMMUNE:COMPROMISED   Final   Culture  Setup Time     Final   Value: 06/03/2013 10:10     Performed at [facility]   Culture     Final   Value: STAPHYLOCOCCUS SPECIES (COAGULASE NEGATIVE)     Note: IDENTIFIED AS STAPHYLOCOCCUS LUGDUNENSIS     Note: Gram Stain Report Called  to,Read Back By and Verified With: BULLINS MEGAN AT 1194 ON 06/03/13 BY Tyrone Schimke M Performed at [facility]     Performed at [facility]   Report Status 06/07/2013 FINAL   Final   Organism ID, Bacteria STAPHYLOCOCCUS SPECIES (COAGULASE NEGATIVE)   Final  CLOSTRIDIUM DIFFICILE BY PCR     Status: None   Collection Time    06/02/13  2:30 PM      Result Value Ref Range Status   C difficile by pcr NEGATIVE  NEGATIVE Final  WOUND CULTURE     Status: None   Collection Time    06/05/13  4:04 PM      Result Value Ref Range Status   Specimen Description WOUND RIGHT HEEL   Final   Special Requests IMMUNE:COMPROMISED   Final   Gram Stain     Final   Value: NO WBC SEEN     MODERATE SQUAMOUS EPITHELIAL CELLS PRESENT     ABUNDANT GRAM NEGATIVE RODS     MODERATE GRAM POSITIVE COCCI IN PAIRS     IN CLUSTERS   Culture     Final   Value: MULTIPLE ORGANISMS PRESENT, NONE PREDOMINANT     Note: NO STAPHYLOCOCCUS AUREUS ISOLATED NO GROUP A STREP (S.PYOGENES) ISOLATED  Performed at [facility]   Report Status 06/08/2013 FINAL   Final  MRSA PCR SCREENING     Status: None   Collection Time    06/06/13  8:14 PM      Result Value Ref Range Status   MRSA by PCR NEGATIVE  NEGATIVE Final   Comment:            The GeneXpert MRSA Assay (FDA     approved for NASAL specimens     only), is one component of a     comprehensive MRSA colonization     surveillance program. It is not     intended to diagnose MRSA     infection nor to guide or     monitor treatment for     MRSA infections.    Anti-infectives   Start     Dose/Rate Route Frequency Ordered Stop   06/14/13 1430  ceFEPIme (MAXIPIME) 2 g in dextrose 5 % 50 mL IVPB     2 g 100 mL/hr over 30 Minutes Intravenous Every M-W-F (Hemodialysis) 06/14/13 1349     06/12/13 1800  ceFEPIme (MAXIPIME) 2 g in dextrose 5 % 50 mL IVPB     2 g 100 mL/hr over 30 Minutes Intravenous  Once 06/12/13 1205 06/12/13 1814   06/11/13 1700   ceFEPIme (MAXIPIME) 2 g in dextrose 5 % 50 mL IVPB     2 g 100 mL/hr over 30 Minutes Intravenous  Once 06/11/13 1228 06/11/13 1720   06/09/13 1200  ceFEPIme (MAXIPIME) 500 mg in dextrose 5 % 50 mL IVPB  Status:  Discontinued     500 mg 100 mL/hr over 30 Minutes Intravenous Every 24 hours 06/09/13 1014 06/10/13 1006   06/04/13 1400  metroNIDAZOLE (FLAGYL) tablet 250 mg  Status:  Discontinued     250 mg Oral 3 times per day 06/04/13 1031 06/08/13 1123   06/04/13 1200  ceFEPIme (MAXIPIME) 2 g in dextrose 5 % 50 mL IVPB  Status:  Discontinued     2 g 100 mL/hr over 30 Minutes Intravenous Every M-W-F (Hemodialysis) 06/04/13 1040 06/09/13 1014   06/04/13 1100  metroNIDAZOLE (FLAGYL) IVPB 250 mg  Status:  Discontinued     250 mg 50 mL/hr over 60 Minutes Intravenous Every 8 hours 06/04/13 1007 06/04/13 1031   06/03/13 1400  piperacillin-tazobactam (ZOSYN) IVPB 2.25 g  Status:  Discontinued     2.25 g 100 mL/hr over 30 Minutes Intravenous Every 8 hours 06/03/13 0911 06/04/13 1007   06/03/13 1200  piperacillin-tazobactam (ZOSYN) IVPB 3.375 g  Status:  Discontinued     3.375 g 12.5 mL/hr over 240 Minutes Intravenous Every 8 hours 06/03/13 0908 06/03/13 0910   06/02/13 1200  vancomycin (VANCOCIN) 500 mg in sodium chloride 0.9 % 100 mL IVPB  Status:  Discontinued     500 mg 100 mL/hr over 60 Minutes Intravenous Every M-W-F (Hemodialysis) 06/01/13 1652 06/10/13 1011   06/01/13 1015  levofloxacin (LEVAQUIN) IVPB 500 mg  Status:  Discontinued     500 mg 100 mL/hr over 60 Minutes Intravenous Every 24 hours 06/01/13 1010 06/01/13 1555   06/01/13 1000  levofloxacin (LEVAQUIN) tablet 500 mg  Status:  Discontinued    Comments:  Pharmacy to dose   500 mg Oral Daily 05/31/13 2234 06/01/13 1009   06/01/13 0400  piperacillin-tazobactam (ZOSYN) IVPB 2.25 g  Status:  Discontinued     2.25 g 100 mL/hr over 30 Minutes Intravenous Every 8 hours 05/31/13 2238 06/03/13  3785   05/31/13 2000  piperacillin-tazobactam  (ZOSYN) IVPB 2.25 g     2.25 g 100 mL/hr over 30 Minutes Intravenous  Once 05/31/13 1936 05/31/13 2023   05/31/13 1949  piperacillin-tazobactam (ZOSYN) 3.375 GM/50ML IVPB    Comments:  Gerlene Burdock   : cabinet override      05/31/13 1949 06/01/13 0759   05/31/13 1945  vancomycin (VANCOCIN) IVPB 1000 mg/200 mL premix     1,000 mg 200 mL/hr over 60 Minutes Intravenous  Once 05/31/13 1936 05/31/13 2130      Assessment: 80 y/o female on day #12 cefepime for R heel osteomyelitis. Patient has severe PVD and only has access through HD cath. Patient missed dose after HD on 5/11 due to no access. Next HD is tomorrow. She is afebrile and WBC are normal.  Vanc 4/27 >> 5/7 *5/4 VR 12.4 mcg/ml >> given extra 500 mg x 1 pre-IR procedure, est level ~21.6 (appropriate pre-HD level)  Zosyn 4/27 >>5/1 LVQ 4/28 >>4/28 Cefepime 5/1 >>  Flagl 5/1 >>5/5  4/27 BCx: 1/2 CoNS (pan sens), no staph aureus isolated 4/29 CDiff PCR negative 5/2 WCx: multiple organisms  Goal of Therapy:  Eradication of infection  Plan:  - Cefepime 2 g IV after HD on MWF - Spoke with HD nurse who will put in HD chart that cefepime is to be given towards the end of HD with access issues - Follow-up LOT and plan  [facility], Pharm.D., BCPS Clinical Pharmacist Pager: [phone] 06/15/2013 12:07 PM

## 2013-06-15 NOTE — Progress Notes (Signed)
I have seen and examined this patient and agree with the plan of care   Sherril Croon 06/15/2013, 11:41 AM

## 2013-06-15 NOTE — Progress Notes (Signed)
[facility]                                                                              Patient Demographics  Katrina Meyer, is a 80 y.o. female, DOB - 1932/03/30, XN:[id number]  Admit date - 05/31/2013   Admitting Physician Raylene Miyamoto, MD  Outpatient Primary MD for the patient is VYAS,DHRUV B., MD  LOS - 15   Chief Complaint  Patient presents with  . Altered Mental Status      Interim history 80 year old female history of end-stage renal disease recently admitted to [facility] in [place] discharge on 05/29/2013 for right heel osteomyelitis. Patient had gone to dialysis on 427, and was found altered mental status and slumped over a chair. She was then brought to the hospital was found to have hypotension. Patient has a history of progressive Parkinson's disease with vascular dementia, altered mental status was thought to be due to this. Patient was to the ICU. Currently she him back to the floor. It seems the patient has undergone a left AKA as well as a third digit amputation of her hand. Family is unrealistic. Currently looking for LTAC placement.   Assessment & Plan   Severe PVD with critical limb ischemia with right heel osteomyelitis and ulcerations -Status post left AKA -Status post right third digit amputation -Has ulcer on right heel -Vascular surgery and wound care consulted and following -Continue cefepime  Septic shock/ Hypotension -Resolved -Continue Midodrine -required pressors while in the ICU  Atrial fibrillation/Elevated trop -Rate controlled -continue bisoprolol -Not a candidate for intervention continue medical therapy -Continue Plavix  Acute encephalopathy with Parkinson's disease and dementia -CT of the head MRI of the brain negative -EEG: Moderate generalized swelling -Aricept currently held -Continue Sinemet -Neurology was consulted  End-stage renal disease requiring hemodialysis -Nephrology consulted and following  Dysphagia  with severe protein calorie malnutrition -Dietary consulted -Continue dysphagia 2 diet and feeding supplementation  Anemia of chronic disease -Likely secondary to her end-stage renal disease -Hemoglobin currently stable -Continue to monitor CBC -Continue Aranesp and ferric gluconate  Diabetes mellitus, Type 2 -Continue insulin sliding scale with CBG monitoring -Hemoglobin A1c 6.0 (4/27)  Hypothyroidism -Continue Levothyroxine  Diarrhea -C. difficile negative  Code Status: Full  Family Communication: Granddaughter at bedside  Disposition Plan: Admitted, looking into LTAC  Time Spent in minutes   30 minutes  Procedures/Significant Events 4/27 Admit to [facility] with AMS / Hypotension per [facility].  5/03 Tx to [facility] for evaluation by Dr. Oneida Alar and Dr. [name]  5/04 L Fem Tunneled HD placement with hypotension post-op. Vasopressors initiated.  5/05 Goals of care discussion initiated  5/06 Persistent hypotension prohibits HD. Deemed by Dr Jonnie Finner and Dr Alva Garnet to not be a candidate for CRRT  5/06 Palliative care consult requested. Plan family conference 5/07  5/07 Remains on vasopressors. No HD planned until after palliative care mtg completed.  5/07 Palliative Care mtg: family expressing unrealistic hopes for outcome that not likely attainable and indicating a desire for continued aggressive care  5/08 Weaned off vasopressors briefly. Episode of symptomatic hypotension at initiation of HD. Phenylephrine restarted. HD completed  STUDIES:  4/27 CT head: Stable atrophy with moderate chronic  microvascular ischemic disease involving the supratentorial white matter. Remote left basal ganglia infarct  4/28 MRI brain: No definite acute intracranial abnormality  4/28 Echocardiogram: EF greater 70%, moderate LVH, unable to determine diastolic dysfunction, mild AS  4/30 MRI R foot: Skin ulceration over the lateral aspect of the right heel with underlying marrow signal abnormality in the calcaneus  compatible with osteomyelitis. Negative for abscess.  5/01 EEG: moderate generalized slowing, no epileptiform activity observed   Consults   PCCM Nephrology Vascular Surgery  Palliative Care Neurology  DVT Prophylaxis  SCDs? Heparin with dialysis  Lab Results  Component Value Date   PLT 240 06/14/2013    Medications  Scheduled Meds: . antiseptic oral rinse  15 mL Mouth Rinse BID  . bisoprolol  2.5 mg Oral BID  . carbidopa-levodopa  2 tablet Oral TID  . ceFEPime (MAXIPIME) IV  2 g Intravenous Q M,W,F-HD  . clopidogrel  75 mg Oral Q breakfast  . collagenase   Topical Daily  . [START ON 06/16/2013] darbepoetin (ARANESP) injection - DIALYSIS  40 mcg Intravenous Q Wed-HD  . feeding supplement (RESOURCE BREEZE)  1 Container Oral BID BM  . [START ON 06/16/2013] ferric gluconate (FERRLECIT/NULECIT) IV  62.5 mg Intravenous Weekly  . insulin aspart  0-5 Units Subcutaneous QHS  . insulin aspart  0-9 Units Subcutaneous TID WC  . levothyroxine  25 mcg Oral QAC breakfast  . metoprolol  5 mg Intravenous 4 times per day  . midodrine  10 mg Oral TID WC  . multivitamin  1 tablet Oral BID   Continuous Infusions:  PRN Meds:.sodium chloride, sodium chloride, heparin, heparin, RESOURCE THICKENUP CLEAR  Antibiotics   Anti-infectives   Start     Dose/Rate Route Frequency Ordered Stop   06/14/13 1430  ceFEPIme (MAXIPIME) 2 g in dextrose 5 % 50 mL IVPB     2 g 100 mL/hr over 30 Minutes Intravenous Every M-W-F (Hemodialysis) 06/14/13 1349     06/12/13 1800  ceFEPIme (MAXIPIME) 2 g in dextrose 5 % 50 mL IVPB     2 g 100 mL/hr over 30 Minutes Intravenous  Once 06/12/13 1205 06/12/13 1814   06/11/13 1700  ceFEPIme (MAXIPIME) 2 g in dextrose 5 % 50 mL IVPB     2 g 100 mL/hr over 30 Minutes Intravenous  Once 06/11/13 1228 06/11/13 1720   06/09/13 1200  ceFEPIme (MAXIPIME) 500 mg in dextrose 5 % 50 mL IVPB  Status:  Discontinued     500 mg 100 mL/hr over 30 Minutes Intravenous Every 24 hours  06/09/13 1014 06/10/13 1006   06/04/13 1400  metroNIDAZOLE (FLAGYL) tablet 250 mg  Status:  Discontinued     250 mg Oral 3 times per day 06/04/13 1031 06/08/13 1123   06/04/13 1200  ceFEPIme (MAXIPIME) 2 g in dextrose 5 % 50 mL IVPB  Status:  Discontinued     2 g 100 mL/hr over 30 Minutes Intravenous Every M-W-F (Hemodialysis) 06/04/13 1040 06/09/13 1014   06/04/13 1100  metroNIDAZOLE (FLAGYL) IVPB 250 mg  Status:  Discontinued     250 mg 50 mL/hr over 60 Minutes Intravenous Every 8 hours 06/04/13 1007 06/04/13 1031   06/03/13 1400  piperacillin-tazobactam (ZOSYN) IVPB 2.25 g  Status:  Discontinued     2.25 g 100 mL/hr over 30 Minutes Intravenous Every 8 hours 06/03/13 0911 06/04/13 1007   06/03/13 1200  piperacillin-tazobactam (ZOSYN) IVPB 3.375 g  Status:  Discontinued     3.375 g 12.5 mL/hr over  240 Minutes Intravenous Every 8 hours 06/03/13 0908 06/03/13 0910   06/02/13 1200  vancomycin (VANCOCIN) 500 mg in sodium chloride 0.9 % 100 mL IVPB  Status:  Discontinued     500 mg 100 mL/hr over 60 Minutes Intravenous Every M-W-F (Hemodialysis) 06/01/13 1652 06/10/13 1011   06/01/13 1015  levofloxacin (LEVAQUIN) IVPB 500 mg  Status:  Discontinued     500 mg 100 mL/hr over 60 Minutes Intravenous Every 24 hours 06/01/13 1010 06/01/13 1555   06/01/13 1000  levofloxacin (LEVAQUIN) tablet 500 mg  Status:  Discontinued    Comments:  Pharmacy to dose   500 mg Oral Daily 05/31/13 2234 06/01/13 1009   06/01/13 0400  piperacillin-tazobactam (ZOSYN) IVPB 2.25 g  Status:  Discontinued     2.25 g 100 mL/hr over 30 Minutes Intravenous Every 8 hours 05/31/13 2238 06/03/13 0908   05/31/13 2000  piperacillin-tazobactam (ZOSYN) IVPB 2.25 g     2.25 g 100 mL/hr over 30 Minutes Intravenous  Once 05/31/13 1936 05/31/13 2023   05/31/13 1949  piperacillin-tazobactam (ZOSYN) 3.375 GM/50ML IVPB    Comments:  Gerlene Burdock   : cabinet override      05/31/13 1949 06/01/13 0759   05/31/13 1945  vancomycin  (VANCOCIN) IVPB 1000 mg/200 mL premix     1,000 mg 200 mL/hr over 60 Minutes Intravenous  Once 05/31/13 1936 05/31/13 2130        Subjective:   Katrina Meyer seen and examined today.  Patient not very verbal (?baseline).  Granddaughter at bedside.   Objective:   Filed Vitals:   06/14/13 2031 06/15/13 0018 06/15/13 0459 06/15/13 0514  BP: 121/73 114/68  133/74  Pulse: 101 113  104  Temp: 97.6 F (36.4 C)   98.6 F (37 C)  TempSrc: Axillary   Oral  Resp: 20   18  Height:      Weight: 62.3 kg (137 lb 5.6 oz)  62.596 kg (138 lb)   SpO2: 100%   100%    Wt Readings from Last 3 Encounters:  06/15/13 62.596 kg (138 lb)  06/15/13 62.596 kg (138 lb)  04/01/13 61.236 kg (135 lb)     Intake/Output Summary (Last 24 hours) at 06/15/13 0732 Last data filed at 06/14/13 1223  Gross per 24 hour  Intake      0 ml  Output   1550 ml  Net  -1550 ml    Exam  General: Well developed, well nourished, NAD, appears stated age  63: NCAT, PERRLA, EOMI, Anicteic Sclera, mucous membranes moist.   Neck: Supple, no JVD, no masses  Cardiovascular: S1 S2 auscultated, irregularly irregular, 2/6SEM  Respiratory: Clear to auscultation bilaterally with equal chest rise  Abdomen: Soft, nontender, nondistended, + bowel sounds  Extremities: Left AKA, right foot in boot- heel wrapped, necrotic areas on right toes, right hand also wrapped  Neuro: Unable to assess, opens eyes, nonverbal, does respond to tactile stimuli  Data Review   Micro Results Recent Results (from the past 240 hour(s))  WOUND CULTURE     Status: None   Collection Time    06/05/13  4:04 PM      Result Value Ref Range Status   Specimen Description WOUND RIGHT HEEL   Final   Special Requests IMMUNE:COMPROMISED   Final   Gram Stain     Final   Value: NO WBC SEEN     MODERATE SQUAMOUS EPITHELIAL CELLS PRESENT     ABUNDANT GRAM NEGATIVE RODS  MODERATE GRAM POSITIVE COCCI IN PAIRS     IN CLUSTERS   Culture     Final    Value: MULTIPLE ORGANISMS PRESENT, NONE PREDOMINANT     Note: NO STAPHYLOCOCCUS AUREUS ISOLATED NO GROUP A STREP (S.PYOGENES) ISOLATED     Performed at [facility]   Report Status 06/08/2013 FINAL   Final  MRSA PCR SCREENING     Status: None   Collection Time    06/06/13  8:14 PM      Result Value Ref Range Status   MRSA by PCR NEGATIVE  NEGATIVE Final   Comment:            The GeneXpert MRSA Assay (FDA     approved for NASAL specimens     only), is one component of a     comprehensive MRSA colonization     surveillance program. It is not     intended to diagnose MRSA     infection nor to guide or     monitor treatment for     MRSA infections.    Radiology Reports Ct Head Wo Contrast  05/31/2013   CLINICAL DATA:  Altered mental status  EXAM: CT HEAD WITHOUT CONTRAST  TECHNIQUE: Contiguous axial images were obtained from the base of the skull through the vertex without intravenous contrast.  COMPARISON:  Prior CT from 03/02/2013  FINDINGS: Diffuse prominence of the CSF containing spaces is compatible with generalized cerebral atrophy. Scattered and confluent hypodensity within the periventricular and deep white matter of both cerebral hemispheres is most compatible with moderate chronic microvascular ischemic changes. Remote left basal ganglia infarcts noted, unchanged.  There is no acute intracranial hemorrhage or infarct. No mass lesion or midline shift. Gray-white matter differentiation is well maintained. Ventricles are normal in size without evidence of hydrocephalus. CSF containing spaces are within normal limits. No extra-axial fluid collection.  The calvarium is intact.  Orbital soft tissues are within normal limits.  The paranasal sinuses and mastoid air cells are well pneumatized and free of fluid.  Scalp soft tissues are unremarkable.  IMPRESSION: 1. No acute intracranial process. 2. Stable atrophy with moderate chronic microvascular ischemic disease involving the  supratentorial white matter. 3. Remote left basal ganglia infarct.   Electronically Signed   By: Jeannine Boga M.D.   On: 05/31/2013 19:21   Mr Brain Wo Contrast  06/01/2013   CLINICAL DATA:  80 year old female with altered mental status and weakness. Confusion. Initial encounter.  EXAM: MRI HEAD WITHOUT CONTRAST  TECHNIQUE: Multiplanar, multiecho pulse sequences of the brain and surrounding structures were obtained without intravenous contrast.  COMPARISON:  Head CT without contrast 05/31/2013 and earlier.  FINDINGS: Stable cerebral volume. No midline shift, mass effect, evidence of mass lesion, ventriculomegaly, extra-axial collection or acute intracranial hemorrhage. Cervicomedullary junction and pituitary are within normal limits. Negative visualized cervical spine. Major intracranial vascular flow voids are stable.  Widespread abnormal T2 and FLAIR hyperintensity in the brain, most resembling sequelae of chronic small vessel disease. There are small chronic infarcts in the cerebellum (right medial cerebellar hemisphere most affected). Evidence of a chronic cortically based infarct in the right frontal operculum noted (series 5 image 16). Chronic lacunar infarct extending from the left corona radiata to the lentiform nuclei. Occasional trace chronic blood products.  On diffusion-weighted imaging today there is a small focus of increased trace diffusion signal in the right gyrus rectus (series 100, image 14 and series 101, image 29), not correlated on ADC. No associated  T2 or FLAIR hyperintensity. Burtis Junes this is artifact.  Trace mastoid fluid. Minor paranasal sinus mucosal thickening. Visualized orbit soft tissues are within normal limits. Visualized scalp soft tissues are within normal limits. Normal bone marrow signal.  IMPRESSION: 1. No definite acute intracranial abnormality. A 4 mm focus of increased trace diffusion signal in the right gyrus rectus is felt to be artifact. 2. Chronic  small and  medium-sized vessel ischemia.   Electronically Signed   By: Lars Pinks M.D.   On: 06/01/2013 09:35   Mr Heel Right Wo Contrast  06/03/2013   CLINICAL DATA:  Diabetic patient with a skin ulcer over the right heel.  EXAM: MR OF THE RIGHT HEEL WITHOUT CONTRAST  TECHNIQUE: Multiplanar, multisequence MR imaging was performed. No intravenous contrast was administered.  COMPARISON:  None.  FINDINGS: A skin ulceration is seen over the lateral margin of the right heel posteriorly. There is marrow edema in the underlying calcaneus involving an area measuring approximately 2.1 cm AP by 1.5 cm transverse by 1.4 cm craniocaudal compatible with osteomyelitis. [name] wound appears to extend almost to cortical bone. No fluid collection is identified. Bone marrow signal is otherwise unremarkable.  All visualized tendons and ligaments appear intact. The sinus tarsus and plantar fascia are unremarkable. No osteochondral lesion of the talar dome is identified.  IMPRESSION: Skin ulceration over the lateral aspect of the right heel with underlying marrow signal abnormality in the calcaneus compatible with osteomyelitis. Negative for abscess.   Electronically Signed   By: Inge Rise M.D.   On: 06/03/2013 11:03   Dg Chest Port 1 View  06/12/2013   CLINICAL DATA:  vol excess , ESRD, ? edema  EXAM: PORTABLE CHEST - 1 VIEW  COMPARISON:  06/07/2013  FINDINGS: Femoral dialysis catheter tip mid right atrium.  . Stable mild cardiomegaly. Some increase in consolidation/atelectasis of the left lung base, with probable small effusion. Mild central pulmonary vascular congestion. Relatively low lung volumes with resultant crowding of bronchovascular structures, can't exclude mild interstitial edema. Atheromatous tortuous aorta.  Regional bones unremarkable.  IMPRESSION: 1. Some increase in asymmetric infiltrates left greater than right with possible small effusion.   Electronically Signed   By: Arne Cleveland M.D.   On: 06/12/2013  09:05   Dg Chest Port 1 View  06/07/2013   CLINICAL DATA:  Status post placement of femoral approach dialysis catheter.  EXAM: PORTABLE CHEST - 1 VIEW  COMPARISON:  Single view of the chest 05/31/2013.  FINDINGS: Femoral approach central venous catheter is in place. Tip of the catheter projects at the superior cavoatrial junction. There is cardiomegaly without edema. The aorta is tortuous. No pneumothorax or pleural effusion. Vascular stent right axilla is again seen.  IMPRESSION: Tip of femoral approach venous catheter projects at the superior cavoatrial junction.  Cardiomegaly without edema.   Electronically Signed   By: Inge Rise M.D.   On: 06/07/2013 14:48   Dg Chest Portable 1 View  05/31/2013   CLINICAL DATA:  Altered mental status; history of previous CVA as well as Parkinson's disease, cardiac dysrhythmia, diabetes and dialysis dependent renal failure  EXAM: PORTABLE CHEST - 1 VIEW  COMPARISON:  DG CHEST 1V PORT dated 03/07/2013  FINDINGS: The lungs are adequately inflated. There is no focal infiltrate. The cardiopericardial silhouette is mildly enlarged but stable. The pulmonary vascularity is not engorged. There is no significant pleural effusion demonstrated. There is tortuosity of the descending thoracic aorta. A vascular graft is present presumably in the  right axilla.  IMPRESSION: There is no evidence of pneumonia nor CHF currently. There is mild stable enlargement of the cardiac silhouette.   Electronically Signed   By: David  Martinique   On: 05/31/2013 18:18    CBC  Recent Labs Lab 06/13/13 0525 06/14/13 0830  WBC 10.5 9.3  HGB 9.5* 9.6*  HCT 28.3* 28.1*  PLT 198 240  MCV 89.6 89.8  MCH 30.1 30.7  MCHC 33.6 34.2  RDW 17.4* 17.6*    Chemistries   Recent Labs Lab 06/10/13 0500 06/11/13 0440 06/12/13 0500 06/13/13 0525 06/14/13 0830  NA 130* 131* 138 140 137  K 4.0 4.3 3.3* 3.9 4.1  CL 95* 97 100 103 101  CO2 18* 19 24 22 25   GLUCOSE 383* 144* 82 67* 62*  BUN 19  24* 11 7 9   CREATININE 3.47* 3.79* 2.14* 1.56* 2.11*  CALCIUM 9.9 10.3 8.3* 8.3* 9.0   ------------------------------------------------------------------------------------------------------------------ estimated creatinine clearance is 21.3 ml/min (by C-G formula based on Cr of 2.11). ------------------------------------------------------------------------------------------------------------------ No results found for this basename: HGBA1C,  in the last 72 hours ------------------------------------------------------------------------------------------------------------------ No results found for this basename: CHOL, HDL, LDLCALC, TRIG, CHOLHDL, LDLDIRECT,  in the last 72 hours ------------------------------------------------------------------------------------------------------------------ No results found for this basename: TSH, T4TOTAL, FREET3, T3FREE, THYROIDAB,  in the last 72 hours ------------------------------------------------------------------------------------------------------------------ No results found for this basename: VITAMINB12, FOLATE, FERRITIN, TIBC, IRON, RETICCTPCT,  in the last 72 hours  Coagulation profile No results found for this basename: INR, PROTIME,  in the last 168 hours  No results found for this basename: DDIMER,  in the last 72 hours  Cardiac Enzymes No results found for this basename: CK, CKMB, TROPONINI, MYOGLOBIN,  in the last 168 hours ------------------------------------------------------------------------------------------------------------------ No components found with this basename: POCBNP,     Besan Ketchem D.O. on 06/15/2013 at 7:32 AM  Between 7am to 7pm - Pager - [phone]  After 7pm go to [email or web address] - password [email or web address]  And look for the night coverage person covering for me after hours  [facility] Office  [phone]

## 2013-06-16 LAB — RENAL FUNCTION PANEL
ALBUMIN: 1.7 g/dL — AB (ref 3.5–5.2)
BUN: 9 mg/dL (ref 6–23)
CO2: 27 mEq/L (ref 19–32)
Calcium: 9.8 mg/dL (ref 8.4–10.5)
Chloride: 102 mEq/L (ref 96–112)
Creatinine, Ser: 1.92 mg/dL — ABNORMAL HIGH (ref 0.50–1.10)
GFR calc non Af Amer: 24 mL/min — ABNORMAL LOW (ref >90)
GFR, EST AFRICAN AMERICAN: 28 mL/min — AB (ref >90)
Glucose, Bld: 153 mg/dL — ABNORMAL HIGH (ref 70–99)
PHOSPHORUS: 2.5 mg/dL (ref 2.3–4.6)
Potassium: 4.1 mEq/L (ref 3.7–5.3)
SODIUM: 139 meq/L (ref 137–147)

## 2013-06-16 LAB — CBC
HCT: 27.3 % — ABNORMAL LOW (ref 36.0–46.0)
Hemoglobin: 9.2 g/dL — ABNORMAL LOW (ref 12.0–15.0)
MCH: 30 pg (ref 26.0–34.0)
MCHC: 33.7 g/dL (ref 30.0–36.0)
MCV: 88.9 fL (ref 78.0–100.0)
PLATELETS: 269 10*3/uL (ref 150–400)
RBC: 3.07 MIL/uL — ABNORMAL LOW (ref 3.87–5.11)
RDW: 18.3 % — AB (ref 11.5–15.5)
WBC: 11.9 10*3/uL — ABNORMAL HIGH (ref 4.0–10.5)

## 2013-06-16 LAB — GLUCOSE, CAPILLARY
Glucose-Capillary: 108 mg/dL — ABNORMAL HIGH (ref 70–99)
Glucose-Capillary: 129 mg/dL — ABNORMAL HIGH (ref 70–99)
Glucose-Capillary: 161 mg/dL — ABNORMAL HIGH (ref 70–99)

## 2013-06-16 MED ORDER — LIDOCAINE HCL (PF) 1 % IJ SOLN: 5.0000 mL | INTRAMUSCULAR | Status: DC | PRN

## 2013-06-16 MED ORDER — HEPARIN SODIUM (PORCINE) 1000 UNIT/ML DIALYSIS
100.0000 [IU]/kg | Freq: Once | INTRAMUSCULAR | Status: DC
  Filled 2013-06-16: qty 7

## 2013-06-16 MED ORDER — SODIUM CHLORIDE 0.9 % IV SOLN: 100.0000 mL | INTRAVENOUS | Status: DC | PRN

## 2013-06-16 MED ORDER — HEPARIN SODIUM (PORCINE) 1000 UNIT/ML DIALYSIS
1000.0000 [IU] | INTRAMUSCULAR | Status: DC | PRN
  Filled 2013-06-16: qty 1

## 2013-06-16 MED ORDER — DARBEPOETIN ALFA-POLYSORBATE 40 MCG/0.4ML IJ SOLN
INTRAMUSCULAR | Status: AC
  Administered 2013-06-16: 40 ug via INTRAVENOUS
  Filled 2013-06-16: qty 0.4

## 2013-06-16 MED ORDER — ALTEPLASE 2 MG IJ SOLR
2.0000 mg | Freq: Once | INTRAMUSCULAR | Status: DC | PRN
  Filled 2013-06-16: qty 2

## 2013-06-16 MED ORDER — LIDOCAINE-PRILOCAINE 2.5-2.5 % EX CREA: 1.0000 "application " | TOPICAL_CREAM | CUTANEOUS | Status: DC | PRN

## 2013-06-16 MED ORDER — NEPRO/CARBSTEADY PO LIQD: 237.0000 mL | ORAL | Status: DC | PRN

## 2013-06-16 MED ORDER — VANCOMYCIN HCL 10 G IV SOLR
1250.0000 mg | Freq: Once | INTRAVENOUS | Status: DC
  Filled 2013-06-16: qty 1250

## 2013-06-16 MED ORDER — PENTAFLUOROPROP-TETRAFLUOROETH EX AERO: 1.0000 "application " | INHALATION_SPRAY | CUTANEOUS | Status: DC | PRN

## 2013-06-16 MED ORDER — VANCOMYCIN HCL 500 MG IV SOLR
500.0000 mg | INTRAVENOUS | Status: DC
  Administered 2013-06-18: 500 mg via INTRAVENOUS
  Filled 2013-06-16 (×2): qty 500

## 2013-06-16 NOTE — Procedures (Signed)
I have seen and examined this patient and agree with the plan of care. No complaints seen on HD     Sherril Croon 06/16/2013, 9:06 AM

## 2013-06-16 NOTE — Progress Notes (Signed)
PROGRESS NOTE  Katrina Meyer LFY:[id number] DOB: August 05, 1932 DOA: 05/31/2013 PCP: Glenda Chroman., MD  Assessment/Plan: Severe PVD with critical limb ischemia with right heel osteomyelitis and ulcerations  -Status post left AKA  -Status post right third digit amputation  -Has ulcer on right heel  -06/03/2013 MRI right foot--osteomyelitis right calcaneus -Vascular surgery and wound care consulted and following  -Continue cefepime  Bacteremia -05/31/2013 blood culture showing Staphylococcus lugdenensis -Surveillance blood cultures -Unclear clinical significance as it only grew in one of 2 sets Septic shock/ Hypotension  -Resolved  -Continue Midodrine  -required pressors while in the ICU  Atrial fibrillation/Elevated trop  -Rate controlled  -continue bisoprolol  -Not a candidate for intervention continue medical therapy  -Continue Plavix  -Elevated troponin do to demand ischemia as well as the patient's atrial fibrillation with RVR in the setting of ESRD PAD with critical ischemia RUE/RLE --Status post proximalization/revision of AVG, ligation of distal AVG, endarterectomy right brachial artery 05/17/13 by Dr. Maudie Mercury.  -Status post PTA right ulnar 90% to 20% residual with steal phenomena and threatened hand. Wound cultures grew Serratia.  -Status post right lower extremity arteriogram 05/25/13, status post mechanical thrombectomy; PTA/stent 80% AT artery to 30% et Ronney Asters. Placement of lytic catheter during procedure with administration of lytics overnight to improve flow. DES was placed at the proximal edge of the AT stent.  Acute encephalopathy with Parkinson's disease and dementia  -CT of the head MRI of the brain negative  -EEG: Moderate generalized swelling  -Aricept currently held  -Continue Sinemet  -Neurology was consulted  End-stage renal disease requiring hemodialysis  -Nephrology consulted and following  Dysphagia with severe protein calorie malnutrition  -Dietary  consulted  -Continue dysphagia 2 diet and feeding supplementation  Anemia of chronic disease  -Likely secondary to her end-stage renal disease  -Hemoglobin currently stable  -Continue to monitor CBC  -Continue Aranesp and ferric gluconate  Diabetes mellitus, Type 2  -Continue insulin sliding scale with CBG monitoring  -Hemoglobin A1c 6.0 (4/27)  Hypothyroidism  -Continue Levothyroxine  Peripheral arterial disease with critical ischemia right upper extremity/right lower extremity Diarrhea  -C. difficile negative   Family Communication:   grandaughter at beside Disposition Plan:   Home when medically stable       Procedures/Studies: Ct Head Wo Contrast  05/31/2013   CLINICAL DATA:  Altered mental status  EXAM: CT HEAD WITHOUT CONTRAST  TECHNIQUE: Contiguous axial images were obtained from the base of the skull through the vertex without intravenous contrast.  COMPARISON:  Prior CT from 03/02/2013  FINDINGS: Diffuse prominence of the CSF containing spaces is compatible with generalized cerebral atrophy. Scattered and confluent hypodensity within the periventricular and deep white matter of both cerebral hemispheres is most compatible with moderate chronic microvascular ischemic changes. Remote left basal ganglia infarcts noted, unchanged.  There is no acute intracranial hemorrhage or infarct. No mass lesion or midline shift. Gray-white matter differentiation is well maintained. Ventricles are normal in size without evidence of hydrocephalus. CSF containing spaces are within normal limits. No extra-axial fluid collection.  The calvarium is intact.  Orbital soft tissues are within normal limits.  The paranasal sinuses and mastoid air cells are well pneumatized and free of fluid.  Scalp soft tissues are unremarkable.  IMPRESSION: 1. No acute intracranial process. 2. Stable atrophy with moderate chronic microvascular ischemic disease involving the supratentorial white matter. 3. Remote left basal  ganglia infarct.   Electronically Signed   By:  Jeannine Boga M.D.   On: 05/31/2013 19:21   Mr Brain Wo Contrast  06/01/2013   CLINICAL DATA:  80 year old female with altered mental status and weakness. Confusion. Initial encounter.  EXAM: MRI HEAD WITHOUT CONTRAST  TECHNIQUE: Multiplanar, multiecho pulse sequences of the brain and surrounding structures were obtained without intravenous contrast.  COMPARISON:  Head CT without contrast 05/31/2013 and earlier.  FINDINGS: Stable cerebral volume. No midline shift, mass effect, evidence of mass lesion, ventriculomegaly, extra-axial collection or acute intracranial hemorrhage. Cervicomedullary junction and pituitary are within normal limits. Negative visualized cervical spine. Major intracranial vascular flow voids are stable.  Widespread abnormal T2 and FLAIR hyperintensity in the brain, most resembling sequelae of chronic small vessel disease. There are small chronic infarcts in the cerebellum (right medial cerebellar hemisphere most affected). Evidence of a chronic cortically based infarct in the right frontal operculum noted (series 5 image 16). Chronic lacunar infarct extending from the left corona radiata to the lentiform nuclei. Occasional trace chronic blood products.  On diffusion-weighted imaging today there is a small focus of increased trace diffusion signal in the right gyrus rectus (series 100, image 14 and series 101, image 29), not correlated on ADC. No associated T2 or FLAIR hyperintensity. Burtis Junes this is artifact.  Trace mastoid fluid. Minor paranasal sinus mucosal thickening. Visualized orbit soft tissues are within normal limits. Visualized scalp soft tissues are within normal limits. Normal bone marrow signal.  IMPRESSION: 1. No definite acute intracranial abnormality. A 4 mm focus of increased trace diffusion signal in the right gyrus rectus is felt to be artifact. 2. Chronic  small and medium-sized vessel ischemia.   Electronically Signed    By: Lars Pinks M.D.   On: 06/01/2013 09:35   Mr Heel Right Wo Contrast  06/03/2013   CLINICAL DATA:  Diabetic patient with a skin ulcer over the right heel.  EXAM: MR OF THE RIGHT HEEL WITHOUT CONTRAST  TECHNIQUE: Multiplanar, multisequence MR imaging was performed. No intravenous contrast was administered.  COMPARISON:  None.  FINDINGS: A skin ulceration is seen over the lateral margin of the right heel posteriorly. There is marrow edema in the underlying calcaneus involving an area measuring approximately 2.1 cm AP by 1.5 cm transverse by 1.4 cm craniocaudal compatible with osteomyelitis. [name] wound appears to extend almost to cortical bone. No fluid collection is identified. Bone marrow signal is otherwise unremarkable.  All visualized tendons and ligaments appear intact. The sinus tarsus and plantar fascia are unremarkable. No osteochondral lesion of the talar dome is identified.  IMPRESSION: Skin ulceration over the lateral aspect of the right heel with underlying marrow signal abnormality in the calcaneus compatible with osteomyelitis. Negative for abscess.   Electronically Signed   By: Inge Rise M.D.   On: 06/03/2013 11:03   Dg Chest Port 1 View  06/12/2013   CLINICAL DATA:  vol excess , ESRD, ? edema  EXAM: PORTABLE CHEST - 1 VIEW  COMPARISON:  06/07/2013  FINDINGS: Femoral dialysis catheter tip mid right atrium.  . Stable mild cardiomegaly. Some increase in consolidation/atelectasis of the left lung base, with probable small effusion. Mild central pulmonary vascular congestion. Relatively low lung volumes with resultant crowding of bronchovascular structures, can't exclude mild interstitial edema. Atheromatous tortuous aorta.  Regional bones unremarkable.  IMPRESSION: 1. Some increase in asymmetric infiltrates left greater than right with possible small effusion.   Electronically Signed   By: Arne Cleveland M.D.   On: 06/12/2013 09:05   Dg Chest [facility]  1 View  06/07/2013   CLINICAL  DATA:  Status post placement of femoral approach dialysis catheter.  EXAM: PORTABLE CHEST - 1 VIEW  COMPARISON:  Single view of the chest 05/31/2013.  FINDINGS: Femoral approach central venous catheter is in place. Tip of the catheter projects at the superior cavoatrial junction. There is cardiomegaly without edema. The aorta is tortuous. No pneumothorax or pleural effusion. Vascular stent right axilla is again seen.  IMPRESSION: Tip of femoral approach venous catheter projects at the superior cavoatrial junction.  Cardiomegaly without edema.   Electronically Signed   By: Inge Rise M.D.   On: 06/07/2013 14:48   Dg Chest Portable 1 View  05/31/2013   CLINICAL DATA:  Altered mental status; history of previous CVA as well as Parkinson's disease, cardiac dysrhythmia, diabetes and dialysis dependent renal failure  EXAM: PORTABLE CHEST - 1 VIEW  COMPARISON:  DG CHEST 1V PORT dated 03/07/2013  FINDINGS: The lungs are adequately inflated. There is no focal infiltrate. The cardiopericardial silhouette is mildly enlarged but stable. The pulmonary vascularity is not engorged. There is no significant pleural effusion demonstrated. There is tortuosity of the descending thoracic aorta. A vascular graft is present presumably in the right axilla.  IMPRESSION: There is no evidence of pneumonia nor CHF currently. There is mild stable enlargement of the cardiac silhouette.   Electronically Signed   By: Jasira Robinson  [place]   On: 05/31/2013 18:18         Subjective: Patient is awake but does not answer questions appropriately. No reports of respiratory distress, vomiting, diarrhea. She is eating with assistance.  Objective: Filed Vitals:   06/16/13 0930 06/16/13 1000 06/16/13 1030 06/16/13 1039  BP: 122/58 110/53 100/49 134/65  Pulse: 98 104 103 111  Temp:    97.6 F (36.4 C)  TempSrc:    Oral  Resp:    14  Height:      Weight:    62 kg (136 lb 11 oz)  SpO2:    100%    Intake/Output Summary (Last 24 hours) at  06/16/13 1310 Last data filed at 06/16/13 1039  Gross per 24 hour  Intake    120 ml  Output   1033 ml  Net   -913 ml   Weight change: -0.5 kg (-1 lb 1.6 oz) Exam:   General:  Pt is alert,  not in acute distress  HEENT: No icterus, No thrush,  [place]/AT  Cardiovascular: IRRR, S1/S2, no rubs, no gallops  Respiratory: Respiratory effort but clear to auscultation  Abdomen: Soft/+BS, non tender, non distended, no guarding  Extremities: No edema-right lower extremity with dry gangrene of the fifth digit; left above-the-knee amputation without any open wounds  Data Reviewed: Basic Metabolic Panel:  Recent Labs Lab 06/12/13 0500 06/13/13 0525 06/14/13 0830 06/15/13 0730 06/16/13 0500  NA 138 140 137 137 139  K 3.3* 3.9 4.1 4.5 4.1  CL 100 103 101 101 102  CO2 24 22 25 23 27   GLUCOSE 82 67* 62* 125* 153*  BUN 11 7 9  5* 9  CREATININE 2.14* 1.56* 2.11* 1.30* 1.92*  CALCIUM 8.3* 8.3* 9.0 8.9 9.8  PHOS 2.6 2.0* 2.7 2.0* 2.5   Liver Function Tests:  Recent Labs Lab 06/12/13 0500 06/13/13 0525 06/14/13 0830 06/15/13 0730 06/16/13 0500  ALBUMIN 1.5* 1.8* 1.7* 1.7* 1.7*   No results found for this basename: LIPASE, AMYLASE,  in the last 168 hours No results found for this basename: AMMONIA,  in the last 168  hours CBC:  Recent Labs Lab 06/13/13 0525 06/14/13 0830 06/16/13 0500  WBC 10.5 9.3 11.9*  HGB 9.5* 9.6* 9.2*  HCT 28.3* 28.1* 27.3*  MCV 89.6 89.8 88.9  PLT 198 240 269   Cardiac Enzymes: No results found for this basename: CKTOTAL, CKMB, CKMBINDEX, TROPONINI,  in the last 168 hours BNP: No components found with this basename: POCBNP,  CBG:  Recent Labs Lab 06/15/13 0753 06/15/13 1202 06/15/13 1736 06/15/13 2147 06/16/13 1143  GLUCAP 126* 134* 206* 110* 108*    Recent Results (from the past 240 hour(s))  MRSA PCR SCREENING     Status: None   Collection Time    06/06/13  8:14 PM      Result Value Ref Range Status   MRSA by PCR NEGATIVE  NEGATIVE  Final   Comment:            The GeneXpert MRSA Assay (FDA     approved for NASAL specimens     only), is one component of a     comprehensive MRSA colonization     surveillance program. It is not     intended to diagnose MRSA     infection nor to guide or     monitor treatment for     MRSA infections.     Scheduled Meds: . antiseptic oral rinse  15 mL Mouth Rinse BID  . bisoprolol  2.5 mg Oral BID  . carbidopa-levodopa  2 tablet Oral TID  . ceFEPime (MAXIPIME) IV  2 g Intravenous Q M,W,F-HD  . clopidogrel  75 mg Oral Q breakfast  . collagenase   Topical Daily  . darbepoetin (ARANESP) injection - DIALYSIS  40 mcg Intravenous Q Wed-HD  . feeding supplement (RESOURCE BREEZE)  1 Container Oral BID BM  . ferric gluconate (FERRLECIT/NULECIT) IV  62.5 mg Intravenous Weekly  . insulin aspart  0-5 Units Subcutaneous QHS  . insulin aspart  0-9 Units Subcutaneous TID WC  . levothyroxine  25 mcg Oral QAC breakfast  . midodrine  10 mg Oral TID WC  . multivitamin  1 tablet Oral BID   Continuous Infusions:    Orson Eva, DO  [facility] Pager [phone]  If 7PM-7AM, please contact night-coverage [email or web address] Password [email or web address] 06/16/2013, 1:10 PM   LOS: 16 days

## 2013-06-16 NOTE — Progress Notes (Signed)
ANTIBIOTIC CONSULT NOTE - INITIAL  Pharmacy Consult for vancomycin Indication: R heel osteomyelitis  Allergies  Allergen Reactions  . Hydralazine Rash  . Hydrochlorothiazide Hives and Rash  Patient Measurements: Height: 5' 6.93" (170 cm) Weight: 136 lb 11 oz (62 kg) IBW/kg (Calculated) : 61.44 Vital Signs: Temp: 98.2 F (36.8 C) (05/13 1807) Temp src: Oral (05/13 1807) BP: 140/70 mmHg (05/13 1807) Pulse Rate: 101 (05/13 1807) Intake/Output from previous day: 05/12 0701 - 05/13 0700 In: 680 [P.O.:680] Out: -  Labs:  Recent Labs  06/14/13 0830 06/15/13 0730 06/16/13 0500  WBC 9.3  --  11.9*  HGB 9.6*  --  9.2*  PLT 240  --  269  CREATININE 2.11* 1.30* 1.92*   Estimated Creatinine Clearance: 23.4 ml/min (by C-G formula based on Cr of 1.92).  Microbiology: 4/27 BCx: 1/2 CoNS (pan sens), no staph aureus isolated 4/29 CDiff PCR negative 5/2 WCx: multiple organisms  Antibiotic History:  Vanc 4/27 >> 5/7 *5/4 VR 12.4 mcg/ml >> given extra 500 mg x 1 pre-IR procedure, est level ~21.6 (appropriate pre-HD level)  Zosyn 4/27 >>5/1 LVQ 4/28 >>4/28 Cefepime 5/1 >>  Flagl 5/1 >>5/5  Assessment: 33 YOF with severe PVD and ESRD on day #13 of cefepime for R-heel osteomyelitis to restart vancomycin in the setting of new leukocytosis. Only access is through HD cath. Patient tolerated HD this AM for 3.5 hr at BFR of 400 throughout session and received HD every MWF. Patient remains afebrile. Patient's last dose of vancomycin was on 06/09/13. Patient has tolerated multiple sessions of HD since that time with presumed clearance.    Goal of Therapy:  Target pre-HD level 15-25 mcg/mL Target post-HD level 5-15 mcg/mL  Plan:  1. Vancomycin 1250mg  IV x1 now.  2. Vancomycin 500mg  post HD MWF.   Sloan Leiter, PharmD, BCPS Clinical Pharmacist [phone] 06/16/2013,7:56 PM

## 2013-06-17 LAB — GLUCOSE, CAPILLARY
GLUCOSE-CAPILLARY: 100 mg/dL — AB (ref 70–99)
GLUCOSE-CAPILLARY: 104 mg/dL — AB (ref 70–99)
GLUCOSE-CAPILLARY: 106 mg/dL — AB (ref 70–99)
GLUCOSE-CAPILLARY: 126 mg/dL — AB (ref 70–99)

## 2013-06-17 LAB — RENAL FUNCTION PANEL
ALBUMIN: 1.7 g/dL — AB (ref 3.5–5.2)
BUN: 7 mg/dL (ref 6–23)
CALCIUM: 9.2 mg/dL (ref 8.4–10.5)
CO2: 26 mEq/L (ref 19–32)
CREATININE: 1.33 mg/dL — AB (ref 0.50–1.10)
Chloride: 100 mEq/L (ref 96–112)
GFR calc Af Amer: 43 mL/min — ABNORMAL LOW (ref >90)
GFR calc non Af Amer: 37 mL/min — ABNORMAL LOW (ref >90)
Glucose, Bld: 113 mg/dL — ABNORMAL HIGH (ref 70–99)
Phosphorus: 2.7 mg/dL (ref 2.3–4.6)
Potassium: 4.9 mEq/L (ref 3.7–5.3)
Sodium: 138 mEq/L (ref 137–147)

## 2013-06-17 MED ORDER — VANCOMYCIN HCL 10 G IV SOLR
1250.0000 mg | Freq: Once | INTRAVENOUS | Status: AC
  Administered 2013-06-17: 1250 mg via INTRAVENOUS
  Filled 2013-06-17 (×2): qty 1250

## 2013-06-17 MED ORDER — SODIUM CHLORIDE 0.9 % IJ SOLN
10.0000 mL | INTRAMUSCULAR | Status: DC | PRN
  Administered 2013-06-17: 10 mL

## 2013-06-17 NOTE — Progress Notes (Signed)
I have seen and examined this patient and agree with the plan of care. Altered mental status at baseline. Quite low blood pressures 90's and multiple wounds. May limit dialysis as weight continues to increase Sherril Croon 06/17/2013, 11:01 AM

## 2013-06-17 NOTE — Progress Notes (Signed)
Subjective:   Resting in bed, appears comfortable. No complaints- per caregiver  Objective Filed Vitals:   06/16/13 1807 06/16/13 2024 06/17/13 0602 06/17/13 1007  BP: 140/70 111/72 121/80 92/58  Pulse: 101 103 100 86  Temp: 98.2 F (36.8 C) 97.4 F (36.3 C) 97.8 F (36.6 C) 97.4 F (36.3 C)  TempSrc: Oral Oral Oral Oral  Resp: 16 16 18 18   Height:      Weight:      SpO2: 100% 97% 94% 100%   Physical Exam General: alert, nonverbal. No acute distress Heart:  Lungs: CTA, unlabored Abdomen: soft, nontender +BS Extremities: L AKA. R foot dressing and boot. R hand dressing Dialysis Access:  R fem cath  HD: MWF   3.5h 3/2.5 Bath 55kg Heparin 6300 R thigh Diatek  Hect 2 Aranesp 40/wk Venofer 50/wk  tsat 56%   Assessment/Plan:  1. AMS - acute on background of chronic dementia. Palliative signed off 2. Septic shock - improved, off pressors. Cont vanc and  Cefepime- per pharmacy 3. Multiple wounds - pressure dermal necrosis lateral R foot and heel, dusky 5th toe; also R hand recent I&D dorsal abcess and removal 3rd finger 4/14.  4. Volume excess - 7 kg up by wts. 5. ESRD - HD on MWF @ [place]; HD pending tomorrow K+4.9 6. Diarrhea - C diff negative.  7. Afib / Cleora Fleet - not candidate for intervention, medical Rx. 8. Access - R AVG clotted, no declot done due to vasc compromise of R hand; has new R fem Diatek. 9. Anemia - Hgb 9.2, Aranesp 40 mcg on Wed  10. Nutrition- alb 1.7. dys diet 11. MBD - Ca 9.2 (11corrected), P 2; 2Ca bath, Hectorol on hold sec to high Ca. 12. Dementia / Parkinson's  13. DM   Shelle Iron, NP [facility] Beeper [phone] 06/17/2013,10:15 AM  LOS: 17 days    Additional Objective Labs: Basic Metabolic Panel:  Recent Labs Lab 06/15/13 0730 06/16/13 0500 06/17/13 0610  NA 137 139 138  K 4.5 4.1 4.9  CL 101 102 100  CO2 23 27 26   GLUCOSE 125* 153* 113*  BUN 5* 9 7  CREATININE 1.30* 1.92* 1.33*  CALCIUM 8.9 9.8 9.2   PHOS 2.0* 2.5 2.7   Liver Function Tests:  Recent Labs Lab 06/15/13 0730 06/16/13 0500 06/17/13 0610  ALBUMIN 1.7* 1.7* 1.7*   No results found for this basename: LIPASE, AMYLASE,  in the last 168 hours CBC:  Recent Labs Lab 06/13/13 0525 06/14/13 0830 06/16/13 0500  WBC 10.5 9.3 11.9*  HGB 9.5* 9.6* 9.2*  HCT 28.3* 28.1* 27.3*  MCV 89.6 89.8 88.9  PLT 198 240 269   Blood Culture    Component Value Date/Time   SDES BLOOD LEFT HAND 06/16/2013 1510   SPECREQUEST BOTTLES DRAWN AEROBIC ONLY [facility] 06/16/2013 1510   CULT  Value:        BLOOD CULTURE RECEIVED NO GROWTH TO DATE CULTURE WILL BE HELD FOR 5 DAYS BEFORE ISSUING A FINAL NEGATIVE REPORT Performed at [facility] 06/16/2013 1510   REPTSTATUS PENDING 06/16/2013 1510    Cardiac Enzymes: No results found for this basename: CKTOTAL, CKMB, CKMBINDEX, TROPONINI,  in the last 168 hours CBG:  Recent Labs Lab 06/15/13 2147 06/16/13 1143 06/16/13 1757 06/16/13 2057 06/17/13 0834  GLUCAP 110* 108* 129* 161* 100*   Iron Studies: No results found for this basename: IRON, TIBC, TRANSFERRIN, FERRITIN,  in the last 72 hours @lablastinr3 @ Studies/Results: No results found. Medications:   .  antiseptic oral rinse  15 mL Mouth Rinse BID  . bisoprolol  2.5 mg Oral BID  . carbidopa-levodopa  2 tablet Oral TID  . ceFEPime (MAXIPIME) IV  2 g Intravenous Q M,W,F-HD  . clopidogrel  75 mg Oral Q breakfast  . collagenase   Topical Daily  . darbepoetin (ARANESP) injection - DIALYSIS  40 mcg Intravenous Q Wed-HD  . feeding supplement (RESOURCE BREEZE)  1 Container Oral BID BM  . ferric gluconate (FERRLECIT/NULECIT) IV  62.5 mg Intravenous Weekly  . insulin aspart  0-5 Units Subcutaneous QHS  . insulin aspart  0-9 Units Subcutaneous TID WC  . levothyroxine  25 mcg Oral QAC breakfast  . midodrine  10 mg Oral TID WC  . multivitamin  1 tablet Oral BID  . vancomycin  1,250 mg Intravenous Once  . [START ON 06/18/2013] vancomycin   500 mg Intravenous Q M,W,F-HD

## 2013-06-17 NOTE — Plan of Care (Signed)
Problem: Consults Goal: General Medical Patient Education See Patient Education Module for specific education.  Outcome: Completed/Met Date Met:  06/17/13 Daughter

## 2013-06-17 NOTE — Progress Notes (Signed)
Spoken with Dr. Carles Collet who tells me not to re-engage with family. Palliative will sign-off. Please call and re-consult if any further changes where we can be of assistance. Thank you.   Vinie Sill, NP Palliative Medicine Team Pager # [phone] (M-F 8a-5p) Team Phone # [phone] (Nights/Weekends)

## 2013-06-17 NOTE — Progress Notes (Signed)
PROGRESS NOTE  Katrina Meyer [id number] DOB: 06-17-1932 DOA: 05/31/2013 PCP: Glenda Chroman., MD  Assessment/Plan: Severe PVD with critical limb ischemia with right heel osteomyelitis and ulcerations  -Status post left AKA  -Status post right third digit amputation (hand) (05/18/13) -Has ulcer on right heel  -06/03/2013 MRI right foot--osteomyelitis right calcaneus  -Vascular surgery and wound care consulted and following  -Continue cefepime  Bacteremia  -05/31/2013 blood culture showing Staphylococcus lugdenensis  -Surveillance blood cultures  -Unclear clinical significance as it only grew in one of 2 sets  -add vancomycin Septic shock/ Hypotension  -Resolved  -Continue Midodrine  -required pressors while in the ICU  Atrial fibrillation/Elevated trop  -Rate controlled  -continue bisoprolol  -Not a candidate for intervention continue medical therapy  -Continue Plavix  -Elevated troponin do to demand ischemia as well as the patient's atrial fibrillation with RVR in the setting of ESRD  PAD with critical ischemia RUE/RLE  --Status post proximalization/revision of AVG, ligation of distal AVG, endarterectomy right brachial artery 05/17/13 by Dr. Maudie Mercury.  -Status post PTA right ulnar 90% to 20% residual with steal phenomena and threatened hand. Wound cultures grew Serratia.  -Status post right lower extremity arteriogram 05/25/13, status post mechanical thrombectomy; PTA/stent 80% AT artery to 30% et Ronney Asters. Placement of lytic catheter during procedure with administration of lytics overnight to improve flow. DES was placed at the proximal edge of the AT stent.  Acute encephalopathy with Parkinsonism and dementia  -CT of the head MRI of the brain negative  -EEG: Moderate generalized swelling  -Aricept currently held  -Continue Sinemet  -Neurology was consulted at [facility] could be due to the effect of old stroke with recurrent symptoms and/or seizures as well as  hypotension -EEG revealed generalized moderate slowing End-stage renal disease requiring hemodialysis  -Nephrology consulted and following  Dysphagia with severe protein calorie malnutrition  -Dietary consulted  -Continue dysphagia 2 diet and feeding supplementation  Anemia of chronic disease  -Likely secondary to her end-stage renal disease  -Hemoglobin currently stable  -Continue to monitor CBC  -Continue Aranesp and ferric gluconate  Diabetes mellitus, Type 2  -Continue insulin sliding scale with CBG monitoring  -Hemoglobin A1c 6.0 (4/27)  Hypothyroidism  -Continue Levothyroxine   Diarrhea  -C. difficile negative  -improved Family Communication: grandaughter at beside  Disposition Plan: Home 06/18/13 if stable          Procedures/Studies: Ct Head Wo Contrast  05/31/2013   CLINICAL DATA:  Altered mental status  EXAM: CT HEAD WITHOUT CONTRAST  TECHNIQUE: Contiguous axial images were obtained from the base of the skull through the vertex without intravenous contrast.  COMPARISON:  Prior CT from 03/02/2013  FINDINGS: Diffuse prominence of the CSF containing spaces is compatible with generalized cerebral atrophy. Scattered and confluent hypodensity within the periventricular and deep white matter of both cerebral hemispheres is most compatible with moderate chronic microvascular ischemic changes. Remote left basal ganglia infarcts noted, unchanged.  There is no acute intracranial hemorrhage or infarct. No mass lesion or midline shift. Gray-white matter differentiation is well maintained. Ventricles are normal in size without evidence of hydrocephalus. CSF containing spaces are within normal limits. No extra-axial fluid collection.  The calvarium is intact.  Orbital soft tissues are within normal limits.  The paranasal sinuses and mastoid air cells are well pneumatized and free of fluid.  Scalp soft tissues are unremarkable.  IMPRESSION: 1. No acute intracranial process. 2. Stable  atrophy  with moderate chronic microvascular ischemic disease involving the supratentorial white matter. 3. Remote left basal ganglia infarct.   Electronically Signed   By: Jeannine Boga M.D.   On: 05/31/2013 19:21   Mr Brain Wo Contrast  06/01/2013   CLINICAL DATA:  80 year old female with altered mental status and weakness. Confusion. Initial encounter.  EXAM: MRI HEAD WITHOUT CONTRAST  TECHNIQUE: Multiplanar, multiecho pulse sequences of the brain and surrounding structures were obtained without intravenous contrast.  COMPARISON:  Head CT without contrast 05/31/2013 and earlier.  FINDINGS: Stable cerebral volume. No midline shift, mass effect, evidence of mass lesion, ventriculomegaly, extra-axial collection or acute intracranial hemorrhage. Cervicomedullary junction and pituitary are within normal limits. Negative visualized cervical spine. Major intracranial vascular flow voids are stable.  Widespread abnormal T2 and FLAIR hyperintensity in the brain, most resembling sequelae of chronic small vessel disease. There are small chronic infarcts in the cerebellum (right medial cerebellar hemisphere most affected). Evidence of a chronic cortically based infarct in the right frontal operculum noted (series 5 image 16). Chronic lacunar infarct extending from the left corona radiata to the lentiform nuclei. Occasional trace chronic blood products.  On diffusion-weighted imaging today there is a small focus of increased trace diffusion signal in the right gyrus rectus (series 100, image 14 and series 101, image 29), not correlated on ADC. No associated T2 or FLAIR hyperintensity. Burtis Junes this is artifact.  Trace mastoid fluid. Minor paranasal sinus mucosal thickening. Visualized orbit soft tissues are within normal limits. Visualized scalp soft tissues are within normal limits. Normal bone marrow signal.  IMPRESSION: 1. No definite acute intracranial abnormality. A 4 mm focus of increased trace diffusion signal in  the right gyrus rectus is felt to be artifact. 2. Chronic  small and medium-sized vessel ischemia.   Electronically Signed   By: Lars Pinks M.D.   On: 06/01/2013 09:35   Mr Heel Right Wo Contrast  06/03/2013   CLINICAL DATA:  Diabetic patient with a skin ulcer over the right heel.  EXAM: MR OF THE RIGHT HEEL WITHOUT CONTRAST  TECHNIQUE: Multiplanar, multisequence MR imaging was performed. No intravenous contrast was administered.  COMPARISON:  None.  FINDINGS: A skin ulceration is seen over the lateral margin of the right heel posteriorly. There is marrow edema in the underlying calcaneus involving an area measuring approximately 2.1 cm AP by 1.5 cm transverse by 1.4 cm craniocaudal compatible with osteomyelitis. [name] wound appears to extend almost to cortical bone. No fluid collection is identified. Bone marrow signal is otherwise unremarkable.  All visualized tendons and ligaments appear intact. The sinus tarsus and plantar fascia are unremarkable. No osteochondral lesion of the talar dome is identified.  IMPRESSION: Skin ulceration over the lateral aspect of the right heel with underlying marrow signal abnormality in the calcaneus compatible with osteomyelitis. Negative for abscess.   Electronically Signed   By: Inge Rise M.D.   On: 06/03/2013 11:03   Dg Chest Port 1 View  06/12/2013   CLINICAL DATA:  vol excess , ESRD, ? edema  EXAM: PORTABLE CHEST - 1 VIEW  COMPARISON:  06/07/2013  FINDINGS: Femoral dialysis catheter tip mid right atrium.  . Stable mild cardiomegaly. Some increase in consolidation/atelectasis of the left lung base, with probable small effusion. Mild central pulmonary vascular congestion. Relatively low lung volumes with resultant crowding of bronchovascular structures, can't exclude mild interstitial edema. Atheromatous tortuous aorta.  Regional bones unremarkable.  IMPRESSION: 1. Some increase in asymmetric infiltrates left greater than right with  possible small effusion.    Electronically Signed   By: Arne Cleveland M.D.   On: 06/12/2013 09:05   Dg Chest Port 1 View  06/07/2013   CLINICAL DATA:  Status post placement of femoral approach dialysis catheter.  EXAM: PORTABLE CHEST - 1 VIEW  COMPARISON:  Single view of the chest 05/31/2013.  FINDINGS: Femoral approach central venous catheter is in place. Tip of the catheter projects at the superior cavoatrial junction. There is cardiomegaly without edema. The aorta is tortuous. No pneumothorax or pleural effusion. Vascular stent right axilla is again seen.  IMPRESSION: Tip of femoral approach venous catheter projects at the superior cavoatrial junction.  Cardiomegaly without edema.   Electronically Signed   By: Inge Rise M.D.   On: 06/07/2013 14:48   Dg Chest Portable 1 View  05/31/2013   CLINICAL DATA:  Altered mental status; history of previous CVA as well as Parkinson's disease, cardiac dysrhythmia, diabetes and dialysis dependent renal failure  EXAM: PORTABLE CHEST - 1 VIEW  COMPARISON:  DG CHEST 1V PORT dated 03/07/2013  FINDINGS: The lungs are adequately inflated. There is no focal infiltrate. The cardiopericardial silhouette is mildly enlarged but stable. The pulmonary vascularity is not engorged. There is no significant pleural effusion demonstrated. There is tortuosity of the descending thoracic aorta. A vascular graft is present presumably in the right axilla.  IMPRESSION: There is no evidence of pneumonia nor CHF currently. There is mild stable enlargement of the cardiac silhouette.   Electronically Signed   By: Hiran Leard  [place]   On: 05/31/2013 18:18         Subjective: Patient is pleasantly confused. She does not answer questions appropriately. No reports of vomiting, respiratory distress, diarrhea, uncontrolled pain.  Objective: Filed Vitals:   06/16/13 2024 06/17/13 0602 06/17/13 1007 06/17/13 1427  BP: 111/72 121/80 92/58 114/51  Pulse: 103 100 86 84  Temp: 97.4 F (36.3 C) 97.8 F (36.6 C) 97.4 F  (36.3 C) 97.5 F (36.4 C)  TempSrc: Oral Oral Oral Oral  Resp: 16 18 18 20   Height:      Weight:      SpO2: 97% 94% 100% 100%    Intake/Output Summary (Last 24 hours) at 06/17/13 1615 Last data filed at 06/17/13 1300  Gross per 24 hour  Intake    360 ml  Output      0 ml  Net    360 ml   Weight change: -0.8 kg (-1 lb 12.2 oz) Exam:   General:  Pt is alert,  not in acute distress  HEENT: No icterus, No thrush,  [place]/AT  Cardiovascular: RRR, S1/S2, no rubs, no gallops  Respiratory: CTA bilaterally, no wheezing, no crackles, no rhonchi  Abdomen: Soft/+BS, non tender, non distended, no guarding Extremities: No edema-No edema-right lower extremity with dry gangrene of the fifth digit, right calcaneus with dry eschar without crepitance; left above-the-knee amputation without any open wounds      Data Reviewed: Basic Metabolic Panel:  Recent Labs Lab 06/13/13 0525 06/14/13 0830 06/15/13 0730 06/16/13 0500 06/17/13 0610  NA 140 137 137 139 138  K 3.9 4.1 4.5 4.1 4.9  CL 103 101 101 102 100  CO2 22 25 23 27 26   GLUCOSE 67* 62* 125* 153* 113*  BUN 7 9 5* 9 7  CREATININE 1.56* 2.11* 1.30* 1.92* 1.33*  CALCIUM 8.3* 9.0 8.9 9.8 9.2  PHOS 2.0* 2.7 2.0* 2.5 2.7   Liver Function Tests:  Recent Labs Lab 06/13/13 [phone]  06/14/13 0830 06/15/13 0730 06/16/13 0500 06/17/13 0610  ALBUMIN 1.8* 1.7* 1.7* 1.7* 1.7*   No results found for this basename: LIPASE, AMYLASE,  in the last 168 hours No results found for this basename: AMMONIA,  in the last 168 hours CBC:  Recent Labs Lab 06/13/13 0525 06/14/13 0830 06/16/13 0500  WBC 10.5 9.3 11.9*  HGB 9.5* 9.6* 9.2*  HCT 28.3* 28.1* 27.3*  MCV 89.6 89.8 88.9  PLT 198 240 269   Cardiac Enzymes: No results found for this basename: CKTOTAL, CKMB, CKMBINDEX, TROPONINI,  in the last 168 hours BNP: No components found with this basename: POCBNP,  CBG:  Recent Labs Lab 06/16/13 1143 06/16/13 1757 06/16/13 2057  06/17/13 0834 06/17/13 1156  GLUCAP 108* 129* 161* 100* 104*    Recent Results (from the past 240 hour(s))  CULTURE, BLOOD (ROUTINE X 2)     Status: None   Collection Time    06/16/13  1:15 PM      Result Value Ref Range Status   Specimen Description BLOOD LEFT THUMB   Final   Special Requests BOTTLES DRAWN AEROBIC ONLY 3CC   Final   Culture  Setup Time     Final   Value: 06/16/2013 18:55     Performed at [facility]   Culture     Final   Value:        BLOOD CULTURE RECEIVED NO GROWTH TO DATE CULTURE WILL BE HELD FOR 5 DAYS BEFORE ISSUING A FINAL NEGATIVE REPORT     Performed at [facility]   Report Status PENDING   Incomplete  CULTURE, BLOOD (ROUTINE X 2)     Status: None   Collection Time    06/16/13  3:10 PM      Result Value Ref Range Status   Specimen Description BLOOD LEFT HAND   Final   Special Requests BOTTLES DRAWN AEROBIC ONLY 6CC   Final   Culture  Setup Time     Final   Value: 06/16/2013 18:56     Performed at [facility]   Culture     Final   Value:        BLOOD CULTURE RECEIVED NO GROWTH TO DATE CULTURE WILL BE HELD FOR 5 DAYS BEFORE ISSUING A FINAL NEGATIVE REPORT     Performed at [facility]   Report Status PENDING   Incomplete     Scheduled Meds: . antiseptic oral rinse  15 mL Mouth Rinse BID  . bisoprolol  2.5 mg Oral BID  . carbidopa-levodopa  2 tablet Oral TID  . ceFEPime (MAXIPIME) IV  2 g Intravenous Q M,W,F-HD  . clopidogrel  75 mg Oral Q breakfast  . collagenase   Topical Daily  . darbepoetin (ARANESP) injection - DIALYSIS  40 mcg Intravenous Q Wed-HD  . feeding supplement (RESOURCE BREEZE)  1 Container Oral BID BM  . ferric gluconate (FERRLECIT/NULECIT) IV  62.5 mg Intravenous Weekly  . insulin aspart  0-5 Units Subcutaneous QHS  . insulin aspart  0-9 Units Subcutaneous TID WC  . levothyroxine  25 mcg Oral QAC breakfast  . midodrine  10 mg Oral TID WC  . multivitamin  1 tablet Oral BID  . [START ON  06/18/2013] vancomycin  500 mg Intravenous Q M,W,F-HD   Continuous Infusions:    Orson Eva, DO  [facility] Pager [phone]  If 7PM-7AM, please contact night-coverage [email or web address] Password [email or web address] 06/17/2013, 4:15 PM   LOS: 17 days

## 2013-06-18 LAB — RENAL FUNCTION PANEL
ALBUMIN: 1.7 g/dL — AB (ref 3.5–5.2)
BUN: 11 mg/dL (ref 6–23)
CO2: 26 meq/L (ref 19–32)
CREATININE: 2.1 mg/dL — AB (ref 0.50–1.10)
Calcium: 10.1 mg/dL (ref 8.4–10.5)
Chloride: 97 mEq/L (ref 96–112)
GFR calc Af Amer: 25 mL/min — ABNORMAL LOW (ref >90)
GFR calc non Af Amer: 21 mL/min — ABNORMAL LOW (ref >90)
Glucose, Bld: 104 mg/dL — ABNORMAL HIGH (ref 70–99)
POTASSIUM: 4 meq/L (ref 3.7–5.3)
Phosphorus: 3.9 mg/dL (ref 2.3–4.6)
Sodium: 136 mEq/L — ABNORMAL LOW (ref 137–147)

## 2013-06-18 LAB — GLUCOSE, CAPILLARY
GLUCOSE-CAPILLARY: 98 mg/dL (ref 70–99)
GLUCOSE-CAPILLARY: 100 mg/dL — AB (ref 70–99)
Glucose-Capillary: 77 mg/dL (ref 70–99)

## 2013-06-18 LAB — CBC
HCT: 29.4 % — ABNORMAL LOW (ref 36.0–46.0)
Hemoglobin: 10 g/dL — ABNORMAL LOW (ref 12.0–15.0)
MCH: 30.6 pg (ref 26.0–34.0)
MCHC: 34 g/dL (ref 30.0–36.0)
MCV: 89.9 fL (ref 78.0–100.0)
PLATELETS: 309 10*3/uL (ref 150–400)
RBC: 3.27 MIL/uL — AB (ref 3.87–5.11)
RDW: 19.2 % — AB (ref 11.5–15.5)
WBC: 11.2 10*3/uL — AB (ref 4.0–10.5)

## 2013-06-18 MED ORDER — VANCOMYCIN HCL 500 MG IV SOLR: 500.0000 mg | INTRAVENOUS | Status: AC

## 2013-06-18 MED ORDER — DEXTROSE 5 % IV SOLN: 2.0000 g | INTRAVENOUS | Status: AC | PRN

## 2013-06-18 MED ORDER — BISOPROLOL FUMARATE 5 MG PO TABS: 2.5000 mg | ORAL_TABLET | Freq: Two times a day (BID) | ORAL | Status: AC

## 2013-06-18 MED ORDER — METRONIDAZOLE 500 MG PO TABS: 500.0000 mg | ORAL_TABLET | Freq: Three times a day (TID) | ORAL | Status: AC

## 2013-06-18 MED ORDER — MIDODRINE HCL 5 MG PO TABS
ORAL_TABLET | ORAL | Status: AC
  Filled 2013-06-18: qty 2

## 2013-06-18 MED ORDER — BOOST / RESOURCE BREEZE PO LIQD: 1.0000 | Freq: Two times a day (BID) | ORAL | Status: AC

## 2013-06-18 MED ORDER — MIDODRINE HCL 10 MG PO TABS: 10.0000 mg | ORAL_TABLET | Freq: Three times a day (TID) | ORAL | Status: AC

## 2013-06-18 MED ORDER — SODIUM CHLORIDE 0.9 % IV SOLN
62.5000 mg | INTRAVENOUS | Status: DC
  Administered 2013-06-18: 62.5 mg via INTRAVENOUS
  Filled 2013-06-18 (×3): qty 5

## 2013-06-18 NOTE — Discharge Summary (Signed)
Physician Discharge Summary  Katrina Meyer [id number] DOB: [birth year] DOA: 05/31/2013  PCP: Glenda Chroman., MD  Admit date: 05/31/2013 Discharge date: 06/18/2013  Recommendations for Outpatient Follow-up:  1. Pt will need to follow up with PCP in 2 weeks post discharge 2. Continue cefazolin 2 g with each dialysis until 07/29/2013 3. Continue vancomycin 500 mg with each dialysis until 07/29/2013 4. Flagyl 500 mg every 8 hours until 07/29/2013   Discharge Diagnoses:  Severe PVD with critical limb ischemia with right heel osteomyelitis and ulcerations  -Status post left AKA  -Status post right third digit amputation (hand) (05/18/13)  -Has ulcer on right heel  -06/03/2013 MRI right foot--osteomyelitis right calcaneus  -Vascular surgery and wound care consulted and following  -Continue cefepime 2 g with each dialysis  -Vancomycin 500 mg with each dialysis until 07/29/2013 -I. spoke with Dr. Justin Mend prior to d/c and he stated HD center has fortaz on formulary -She can receive fortaz with each HD until 07/29/13 -This will complete 6 weeks of IV antibiotics Bacteremia  -05/31/2013 blood culture showing Staphylococcus lugdenensis  -Surveillance blood cultures  neg -Unclear clinical significance as it only grew in one of 2 sets  -added vancomycin  Septic shock/ Hypotension  -Resolved  -Continue Midodrine  -required pressors while in the ICU  Atrial fibrillation/Elevated trop  -Rate controlled  -continue bisoprolol  -Not a candidate for intervention continue medical therapy  -Continue Plavix  -Elevated troponin do to demand ischemia as well as the patient's atrial fibrillation with RVR in the setting of ESRD  PAD with critical ischemia RUE/RLE  --Status post proximalization/revision of AVG, ligation of distal AVG, endarterectomy right brachial artery 05/17/13 by Dr. Maudie Mercury.  -Status post PTA right ulnar 90% to 20% residual with steal phenomena and threatened hand. Wound cultures grew  Serratia.  -Status post right lower extremity arteriogram 05/25/13, status post mechanical thrombectomy; PTA/stent 80% AT artery to 30% et Ronney Asters. Placement of lytic catheter during procedure with administration of lytics overnight to improve flow. DES was placed at the proximal edge of the AT stent.  Acute encephalopathy with Parkinsonism and dementia  -Overall improved and back to baseline according to the patient patient's family -CT of the head MRI of the brain negative  -EEG: Moderate generalized swelling  -Aricept currently held  -Continue Sinemet  -Neurology was consulted at [facility] could be due to the effect of old stroke with recurrent symptoms and/or seizures as well as hypotension  -EEG revealed generalized moderate slowing  End-stage renal disease requiring hemodialysis  -Nephrology consulted and following  Dysphagia with severe protein calorie malnutrition  -Dietary consulted  -Continue dysphagia 2 diet and feeding supplementation  Anemia of chronic disease  -Likely secondary to her end-stage renal disease  -Hemoglobin currently stable  -Continue to monitor CBC  -Continue Aranesp and ferric gluconate  Diabetes mellitus, Type 2  -Continue insulin sliding scale with CBG monitoring  -Hemoglobin A1c 6.0 (4/27)  Hypothyroidism  -Continue Levothyroxine  Diarrhea  -C. difficile negative  -improved  Brief summary  The patient is a 80 year old woman with advanced dementia, likely vascular dementia, advanced Parkinson's disease, and severe peripheral vascular disease. She was recently hospitalized at [facility] in [place] from 05/13/13 through 05/29/13 for management of peripheral arterial disease with critical limb ischemia in the right upper extremity/right lower extremity. During that hospitalization, the patient had a number of vascularization procedures (see note below). She has dry gangrene of her right foot. She had incision and drainage of abscess on her  right hand  and/or right foot She was treated with vancomycin and Zosyn during most of the hospitalization. Her hand wound culture grew out Serratia and she was discharged on oral Levaquin. She presented to [facility] on 05/31/2013 for altered mental status associated with hemodialysis related hypotension. Sepsis was not assumed, but she was started on vancomycin and Zosyn. Studies were ordered and revealed osteomyelitis of the right heel. 2-D echocardiogram revealed preserved left ventricular function. Wound culture of the right foot is pending, but reveals abundant gram-negative rods and moderate gram-positive cocci. One out of 2 blood cultures was positive for Staphylococcus species. Antibiotics were changed to cefepime, Flagyl, and vancomycin per our discussion with ID Dr. Megan Salon (prior to the foot culture). Encephalopathy workup has been essentially unremarkable. The patient has advanced dementia and Parkinson's disease coupled with intermittent hypotension and metabolic derangements which likely causes recurrent encephalopathy. She was followed by a neurologist Dr. [name]. She is on hemodialysis and received hemodialysis on last Friday. However, it was noted that there was no thrill or bruit at the right upper extremity fistula this morning. This is presumed to be secondary to a clotted off fistula. Therefore, the patient needed to be transferred for intervention of the clot or insertion of a new temporary hemodialysis catheter. This was discussed with vascular surgeon, Dr. Juanda Crumble FIelds who agreed to evaluate her when she arrives at [facility] She was seen by [facility], Dr. Bridgett Larsson, for her thrombosed venous fistula access. A right femoral vein dialysis catheter was placed on 06/07/2013. The patient became hypotensive and was transferred to the ICU after surgery. She was on vasopressors for a period of time and eventually stabilized. She was transferred back to the medical floor on 06/15/2013. Surveillance blood cultures  were obtained and remained negative. [name] antibiotics were switched to vancomycin and ceftazidime for ease of administration on dialysis for her right heel osteomyelitis. The patient remained hemodynamically stable on midodrine. She was seen by palliative medicine, but the family wanted to continue for aggressive care with curative intent.     Discharge Condition: stable  Disposition: home  Diet: Wt Readings from Last 3 Encounters:  06/16/13 62 kg (136 lb 11 oz)  06/16/13 62 kg (136 lb 11 oz)  04/01/13 61.236 kg (135 lb)       Consultants: Nephrology [facility]--Dr. Bridgett Larsson CCM  Discharge Exam: Filed Vitals:   06/18/13 1156  BP: 151/69  Pulse: 100  Temp: 97.8 F (36.6 C)  Resp: 18   Filed Vitals:   06/17/13 1825 06/18/13 0500 06/18/13 0728 06/18/13 1156  BP: 108/57 158/97 133/85 151/69  Pulse: 82 93 88 100  Temp: 97.4 F (36.3 C) 97.6 F (36.4 C) 97.9 F (36.6 C) 97.8 F (36.6 C)  TempSrc: Oral Oral Axillary Oral  Resp: 18 16 17 18   Height:      Weight:      SpO2: 100% 99% 100% 100%   General: Alert and awake, NAD, pleasant, cooperative Cardiovascular: RRR, no rub, no gallop, no S3 Respiratory: CTAB, no wheeze, no rhonchi Abdomen:soft, nontender, nondistended, positive bowel sounds   Discharge Instructions      Discharge Instructions   Diet - low sodium heart healthy    Complete by:  As directed      Increase activity slowly    Complete by:  As directed             Medication List    STOP taking these medications       carvedilol 6.25  MG tablet  Commonly known as:  COREG     FLUoxetine 40 MG capsule  Commonly known as:  PROZAC     levofloxacin 500 MG tablet  Commonly known as:  LEVAQUIN      TAKE these medications       atorvastatin 10 MG tablet  Commonly known as:  LIPITOR  Take 10 mg by mouth every morning.     bisoprolol 5 MG tablet  Commonly known as:  ZEBETA  Take 0.5 tablets (2.5 mg total) by mouth 2 (two) times daily.       carbidopa-levodopa 25-100 MG per tablet  Commonly known as:  SINEMET IR  Take 1.5 tablets by mouth 3 (three) times daily.     cefTAZidime 2 g in dextrose 5 % 50 mL  Inject 2 g into the vein every dialysis. On Mon-Wed-Friday     cinacalcet 30 MG tablet  Commonly known as:  SENSIPAR  Take 30 mg by mouth every evening.     clopidogrel 75 MG tablet  Commonly known as:  PLAVIX  Take 75 mg by mouth daily.     collagenase ointment  Commonly known as:  SANTYL  Apply 1 application topically daily.     donepezil 10 MG tablet  Commonly known as:  ARICEPT  Take 10 mg by mouth at bedtime.     feeding supplement (NEPRO CARB STEADY) Liqd  Take 237 mLs by mouth daily.     feeding supplement (RESOURCE BREEZE) Liqd  Take 1 Container by mouth 2 (two) times daily between meals.     GEBAUERS SPRAY AND STRETCH Aero  Generic drug:  pentafluoroprop-tetrafluoroeth  Apply 1 application topically daily. Uses at Dialysis treatments     HYDROcodone-acetaminophen 5-325 MG per tablet  Commonly known as:  NORCO/VICODIN  Take 1 tablet by mouth every 4 (four) hours as needed. For pain     insulin glargine 100 UNIT/ML injection  Commonly known as:  LANTUS  Inject 1-5 Units into the skin at bedtime as needed. Per Sliding Scale     insulin lispro 100 UNIT/ML injection  Commonly known as:  HUMALOG  - Inject 1-4 Units into the skin 3 (three) times daily with meals. 200-250= 1 unit  - 251-300= 2 unit  - 301-350= 3 unit  - 351-400= 4 unit     levothyroxine 25 MCG tablet  Commonly known as:  SYNTHROID, LEVOTHROID  Take 25 mcg by mouth every morning.     metroNIDAZOLE 500 MG tablet  Commonly known as:  FLAGYL  Take 1 tablet (500 mg total) by mouth 3 (three) times daily.     midodrine 10 MG tablet  Commonly known as:  PROAMATINE  Take 1 tablet (10 mg total) by mouth 3 (three) times daily with meals.     multivitamin Tabs tablet  Take 1 tablet by mouth 2 (two) times daily.     RESOURCE  THICKENUP CLEAR Powd  To thicken liquids to nectar thick     vancomycin 500 mg in sodium chloride 0.9 % 100 mL  Inject 500 mg into the vein every Monday, Wednesday, and Friday with hemodialysis.     Vitamin D (Ergocalciferol) 50000 UNITS Caps capsule  Commonly known as:  DRISDOL  Take 1 capsule by mouth once a week.         The results of significant diagnostics from this hospitalization (including imaging, microbiology, ancillary and laboratory) are listed below for reference.    Significant Diagnostic Studies: Ct Head Wo Contrast  05/31/2013   CLINICAL DATA:  Altered mental status  EXAM: CT HEAD WITHOUT CONTRAST  TECHNIQUE: Contiguous axial images were obtained from the base of the skull through the vertex without intravenous contrast.  COMPARISON:  Prior CT from 03/02/2013  FINDINGS: Diffuse prominence of the CSF containing spaces is compatible with generalized cerebral atrophy. Scattered and confluent hypodensity within the periventricular and deep white matter of both cerebral hemispheres is most compatible with moderate chronic microvascular ischemic changes. Remote left basal ganglia infarcts noted, unchanged.  There is no acute intracranial hemorrhage or infarct. No mass lesion or midline shift. Gray-white matter differentiation is well maintained. Ventricles are normal in size without evidence of hydrocephalus. CSF containing spaces are within normal limits. No extra-axial fluid collection.  The calvarium is intact.  Orbital soft tissues are within normal limits.  The paranasal sinuses and mastoid air cells are well pneumatized and free of fluid.  Scalp soft tissues are unremarkable.  IMPRESSION: 1. No acute intracranial process. 2. Stable atrophy with moderate chronic microvascular ischemic disease involving the supratentorial white matter. 3. Remote left basal ganglia infarct.   Electronically Signed   By: Jeannine Boga M.D.   On: 05/31/2013 19:21   Mr Brain Wo  Contrast  06/01/2013   CLINICAL DATA:  80 year old female with altered mental status and weakness. Confusion. Initial encounter.  EXAM: MRI HEAD WITHOUT CONTRAST  TECHNIQUE: Multiplanar, multiecho pulse sequences of the brain and surrounding structures were obtained without intravenous contrast.  COMPARISON:  Head CT without contrast 05/31/2013 and earlier.  FINDINGS: Stable cerebral volume. No midline shift, mass effect, evidence of mass lesion, ventriculomegaly, extra-axial collection or acute intracranial hemorrhage. Cervicomedullary junction and pituitary are within normal limits. Negative visualized cervical spine. Major intracranial vascular flow voids are stable.  Widespread abnormal T2 and FLAIR hyperintensity in the brain, most resembling sequelae of chronic small vessel disease. There are small chronic infarcts in the cerebellum (right medial cerebellar hemisphere most affected). Evidence of a chronic cortically based infarct in the right frontal operculum noted (series 5 image 16). Chronic lacunar infarct extending from the left corona radiata to the lentiform nuclei. Occasional trace chronic blood products.  On diffusion-weighted imaging today there is a small focus of increased trace diffusion signal in the right gyrus rectus (series 100, image 14 and series 101, image 29), not correlated on ADC. No associated T2 or FLAIR hyperintensity. Burtis Junes this is artifact.  Trace mastoid fluid. Minor paranasal sinus mucosal thickening. Visualized orbit soft tissues are within normal limits. Visualized scalp soft tissues are within normal limits. Normal bone marrow signal.  IMPRESSION: 1. No definite acute intracranial abnormality. A 4 mm focus of increased trace diffusion signal in the right gyrus rectus is felt to be artifact. 2. Chronic  small and medium-sized vessel ischemia.   Electronically Signed   By: Lars Pinks M.D.   On: 06/01/2013 09:35   Mr Heel Right Wo Contrast  06/03/2013   CLINICAL DATA:  Diabetic  patient with a skin ulcer over the right heel.  EXAM: MR OF THE RIGHT HEEL WITHOUT CONTRAST  TECHNIQUE: Multiplanar, multisequence MR imaging was performed. No intravenous contrast was administered.  COMPARISON:  None.  FINDINGS: A skin ulceration is seen over the lateral margin of the right heel posteriorly. There is marrow edema in the underlying calcaneus involving an area measuring approximately 2.1 cm AP by 1.5 cm transverse by 1.4 cm craniocaudal compatible with osteomyelitis. [name] wound appears to extend almost to cortical bone. No fluid collection  is identified. Bone marrow signal is otherwise unremarkable.  All visualized tendons and ligaments appear intact. The sinus tarsus and plantar fascia are unremarkable. No osteochondral lesion of the talar dome is identified.  IMPRESSION: Skin ulceration over the lateral aspect of the right heel with underlying marrow signal abnormality in the calcaneus compatible with osteomyelitis. Negative for abscess.   Electronically Signed   By: Inge Rise M.D.   On: 06/03/2013 11:03   Dg Chest Port 1 View  06/12/2013   CLINICAL DATA:  vol excess , ESRD, ? edema  EXAM: PORTABLE CHEST - 1 VIEW  COMPARISON:  06/07/2013  FINDINGS: Femoral dialysis catheter tip mid right atrium.  . Stable mild cardiomegaly. Some increase in consolidation/atelectasis of the left lung base, with probable small effusion. Mild central pulmonary vascular congestion. Relatively low lung volumes with resultant crowding of bronchovascular structures, can't exclude mild interstitial edema. Atheromatous tortuous aorta.  Regional bones unremarkable.  IMPRESSION: 1. Some increase in asymmetric infiltrates left greater than right with possible small effusion.   Electronically Signed   By: Arne Cleveland M.D.   On: 06/12/2013 09:05   Dg Chest Port 1 View  06/07/2013   CLINICAL DATA:  Status post placement of femoral approach dialysis catheter.  EXAM: PORTABLE CHEST - 1 VIEW  COMPARISON:   Single view of the chest 05/31/2013.  FINDINGS: Femoral approach central venous catheter is in place. Tip of the catheter projects at the superior cavoatrial junction. There is cardiomegaly without edema. The aorta is tortuous. No pneumothorax or pleural effusion. Vascular stent right axilla is again seen.  IMPRESSION: Tip of femoral approach venous catheter projects at the superior cavoatrial junction.  Cardiomegaly without edema.   Electronically Signed   By: Inge Rise M.D.   On: 06/07/2013 14:48   Dg Chest Portable 1 View  05/31/2013   CLINICAL DATA:  Altered mental status; history of previous CVA as well as Parkinson's disease, cardiac dysrhythmia, diabetes and dialysis dependent renal failure  EXAM: PORTABLE CHEST - 1 VIEW  COMPARISON:  DG CHEST 1V PORT dated 03/07/2013  FINDINGS: The lungs are adequately inflated. There is no focal infiltrate. The cardiopericardial silhouette is mildly enlarged but stable. The pulmonary vascularity is not engorged. There is no significant pleural effusion demonstrated. There is tortuosity of the descending thoracic aorta. A vascular graft is present presumably in the right axilla.  IMPRESSION: There is no evidence of pneumonia nor CHF currently. There is mild stable enlargement of the cardiac silhouette.   Electronically Signed   By: Ameria Sanjurjo  [place]   On: 05/31/2013 18:18     Microbiology: Recent Results (from the past 240 hour(s))  CULTURE, BLOOD (ROUTINE X 2)     Status: None   Collection Time    06/16/13  1:15 PM      Result Value Ref Range Status   Specimen Description BLOOD LEFT THUMB   Final   Special Requests BOTTLES DRAWN AEROBIC ONLY 3CC   Final   Culture  Setup Time     Final   Value: 06/16/2013 18:55     Performed at [facility]   Culture     Final   Value:        BLOOD CULTURE RECEIVED NO GROWTH TO DATE CULTURE WILL BE HELD FOR 5 DAYS BEFORE ISSUING A FINAL NEGATIVE REPORT     Performed at [facility]   Report Status  PENDING   Incomplete  CULTURE, BLOOD (ROUTINE X 2)  Status: None   Collection Time    06/16/13  3:10 PM      Result Value Ref Range Status   Specimen Description BLOOD LEFT HAND   Final   Special Requests BOTTLES DRAWN AEROBIC ONLY 6CC   Final   Culture  Setup Time     Final   Value: 06/16/2013 18:56     Performed at [facility]   Culture     Final   Value:        BLOOD CULTURE RECEIVED NO GROWTH TO DATE CULTURE WILL BE HELD FOR 5 DAYS BEFORE ISSUING A FINAL NEGATIVE REPORT     Performed at [facility]   Report Status PENDING   Incomplete     Labs: Basic Metabolic Panel:  Recent Labs Lab 06/13/13 0525 06/14/13 0830 06/15/13 0730 06/16/13 0500 06/17/13 0610  NA 140 137 137 139 138  K 3.9 4.1 4.5 4.1 4.9  CL 103 101 101 102 100  CO2 22 25 23 27 26   GLUCOSE 67* 62* 125* 153* 113*  BUN 7 9 5* 9 7  CREATININE 1.56* 2.11* 1.30* 1.92* 1.33*  CALCIUM 8.3* 9.0 8.9 9.8 9.2  PHOS 2.0* 2.7 2.0* 2.5 2.7   Liver Function Tests:  Recent Labs Lab 06/13/13 0525 06/14/13 0830 06/15/13 0730 06/16/13 0500 06/17/13 0610  ALBUMIN 1.8* 1.7* 1.7* 1.7* 1.7*   No results found for this basename: LIPASE, AMYLASE,  in the last 168 hours No results found for this basename: AMMONIA,  in the last 168 hours CBC:  Recent Labs Lab 06/13/13 0525 06/14/13 0830 06/16/13 0500  WBC 10.5 9.3 11.9*  HGB 9.5* 9.6* 9.2*  HCT 28.3* 28.1* 27.3*  MCV 89.6 89.8 88.9  PLT 198 240 269   Cardiac Enzymes: No results found for this basename: CKTOTAL, CKMB, CKMBINDEX, TROPONINI,  in the last 168 hours BNP: No components found with this basename: POCBNP,  CBG:  Recent Labs Lab 06/17/13 1156 06/17/13 1659 06/17/13 2128 06/18/13 0732 06/18/13 1147  GLUCAP 104* 106* 126* 98 100*    Time coordinating discharge:  Greater than 30 minutes  Signed:  Orson Eva, DO [facility] Pager: [phone] 06/18/2013, 1:06 PM

## 2013-06-18 NOTE — Progress Notes (Signed)
CARE MANAGEMENT NOTE 06/18/2013  Patient:  Katrina Meyer, Katrina Meyer   Account Number:  [id number]  Date Initiated:  06/01/2013  Documentation initiated by:  Claretha Cooper  Subjective/Objective Assessment:   Pt admitted from home. G'daughter is caregiver and states that her current condition is not her normal. Currently [facility] provides RN,  PT, and aid service.     Action/Plan:   Will follow to see if pt is able to return home with [facility] or will need placement   Anticipated DC Date:  06/18/2013   Anticipated DC Plan:  [place]  In-house referral  Clinical Social Worker      DC Planning Services  CM consult      [facility] Choice  Resumption Of Svcs/PTA Provider   Choice offered to / List presented to:          [facility] arranged  HH-1 RN  [place] agency  [place]   Status of service:  Completed, signed off Medicare Important Message given?  YES (If response is "NO", the following Medicare IM given date fields will be blank) Date Medicare IM given:  06/04/2013 Date Additional Medicare IM given:  06/15/2013  Discharge Disposition:  [place]  Per UR Regulation:  Reviewed for med. necessity/level of care/duration of stay  If discussed at [place] of Stay Meetings, dates discussed:    Comments:  06/18/2013  [place], [place]/ [facility] called to advise of discharge to home today, with home health RN, PT, OT  06/15/2013  [place], [place] [phone] met with daughter and granddaughter regarding discharge planning, discussed option of LTAC. Daughter wants to take the patient home, they have provided all the care prior to admission.  Active with [facility] and want to continue , RN, PT, OT.  06/15/2013  [place], [place] [facility]AC evaluation: [facility]  appropriate for admission [facility] appropriate for admission  Dr Sander Nephew updated on [place]  06-11-13 [place],  [place] Continues on pressors - Still with soft BP - palliative conferences continue with family - continues to be full code at this time.  5/5  0845 debbie dowell rn,bsn pt transf to [facility]. on vent. will follow.  06/04/13 Amy Robson RN BSN CM lives with daughter who is at bedside. per daughter, she has sitters 24/7. Uses [facility] for [facility] RN and would prefer outpt PT. CM discussed with daughter that if pt is able to leave for outpt PT, she would not be eligible for [facility] RN. Agreed pt will initially be  home bound and could benefit from [facility] RN and PT  06/01/13 Claretha Cooper RN BSN CM

## 2013-06-18 NOTE — Progress Notes (Signed)
ANTIBIOTIC CONSULT NOTE - FOLLOW UP  Pharmacy Consult for Vanco/Cefepime Indication: Osteo, bacteremia  Allergies  Allergen Reactions  . Hydralazine Rash  . Hydrochlorothiazide Hives and Rash    Patient Measurements: Height: 5' 6.93" (170 cm) Weight: 136 lb 11 oz (62 kg) IBW/kg (Calculated) : 61.44 Adjusted Body Weight:   Vital Signs: Temp: 97.9 F (36.6 C) (05/15 0728) Temp src: Axillary (05/15 0728) BP: 133/85 mmHg (05/15 0728) Pulse Rate: 88 (05/15 0728) Intake/Output from previous day: 05/14 0701 - 05/15 0700 In: 520 [P.O.:240; I.V.:30; IV Piggyback:250] Out: -  Intake/Output from this shift:    Labs:  Recent Labs  06/16/13 0500 06/17/13 0610  WBC 11.9*  --   HGB 9.2*  --   PLT 269  --   CREATININE 1.92* 1.33*   Estimated Creatinine Clearance: 33.8 ml/min (by C-G formula based on Cr of 1.33). No results found for this basename: VANCOTROUGH, VANCOPEAK, VANCORANDOM, [place], [place], [place], [place], TOBRAPEAK, TOBRARND, AMIKACINPEAK, AMIKACINTROU, AMIKACIN,  in the last 72 hours   Microbiology: Recent Results (from the past 720 hour(s))  CULTURE, BLOOD (ROUTINE X 2)     Status: None   Collection Time    05/31/13  6:27 PM      Result Value Ref Range Status   Specimen Description BLOOD LEFT ANTECUBITAL   Final   Special Requests     Final   Value: BOTTLES DRAWN AEROBIC AND ANAEROBIC [place]  IMMUNE:COMPROMISED   Culture NO GROWTH 5 DAYS   Final   Report Status 06/05/2013 FINAL   Final  CULTURE, BLOOD (ROUTINE X 2)     Status: None   Collection Time    05/31/13  7:13 PM      Result Value Ref Range Status   Specimen Description BLOOD LEFT HAND   Final   Special Requests BOTTLES DRAWN AEROBIC ONLY 4CC  IMMUNE:COMPROMISED   Final   Culture  Setup Time     Final   Value: 06/03/2013 10:10     Performed at [facility]   Culture     Final   Value: STAPHYLOCOCCUS SPECIES (COAGULASE NEGATIVE)     Note: IDENTIFIED AS STAPHYLOCOCCUS LUGDUNENSIS      Note: Gram Stain Report Called to,Read Back By and Verified With: BULLINS MEGAN AT [id number] ON 06/03/13 BY Tyrone Schimke M Performed at [facility]     Performed at [facility]   Report Status 06/07/2013 FINAL   Final   Organism ID, Bacteria STAPHYLOCOCCUS SPECIES (COAGULASE NEGATIVE)   Final  CLOSTRIDIUM DIFFICILE BY PCR     Status: None   Collection Time    06/02/13  2:30 PM      Result Value Ref Range Status   C difficile by pcr NEGATIVE  NEGATIVE Final  WOUND CULTURE     Status: None   Collection Time    06/05/13  4:04 PM      Result Value Ref Range Status   Specimen Description WOUND RIGHT HEEL   Final   Special Requests IMMUNE:COMPROMISED   Final   Gram Stain     Final   Value: NO WBC SEEN     MODERATE SQUAMOUS EPITHELIAL CELLS PRESENT     ABUNDANT GRAM NEGATIVE RODS     MODERATE GRAM POSITIVE COCCI IN PAIRS     IN CLUSTERS   Culture     Final   Value: MULTIPLE ORGANISMS PRESENT, NONE PREDOMINANT     Note: NO STAPHYLOCOCCUS AUREUS ISOLATED NO GROUP A STREP (S.PYOGENES)  ISOLATED     Performed at [facility]   Report Status 06/08/2013 FINAL   Final  MRSA PCR SCREENING     Status: None   Collection Time    06/06/13  8:14 PM      Result Value Ref Range Status   MRSA by PCR NEGATIVE  NEGATIVE Final   Comment:            The GeneXpert MRSA Assay (FDA     approved for NASAL specimens     only), is one component of a     comprehensive MRSA colonization     surveillance program. It is not     intended to diagnose MRSA     infection nor to guide or     monitor treatment for     MRSA infections.  CULTURE, BLOOD (ROUTINE X 2)     Status: None   Collection Time    06/16/13  1:15 PM      Result Value Ref Range Status   Specimen Description BLOOD LEFT THUMB   Final   Special Requests BOTTLES DRAWN AEROBIC ONLY 3CC   Final   Culture  Setup Time     Final   Value: 06/16/2013 18:55     Performed at [facility]   Culture     Final   Value:         BLOOD CULTURE RECEIVED NO GROWTH TO DATE CULTURE WILL BE HELD FOR 5 DAYS BEFORE ISSUING A FINAL NEGATIVE REPORT     Performed at [facility]   Report Status PENDING   Incomplete  CULTURE, BLOOD (ROUTINE X 2)     Status: None   Collection Time    06/16/13  3:10 PM      Result Value Ref Range Status   Specimen Description BLOOD LEFT HAND   Final   Special Requests BOTTLES DRAWN AEROBIC ONLY 6CC   Final   Culture  Setup Time     Final   Value: 06/16/2013 18:56     Performed at [facility]   Culture     Final   Value:        BLOOD CULTURE RECEIVED NO GROWTH TO DATE CULTURE WILL BE HELD FOR 5 DAYS BEFORE ISSUING A FINAL NEGATIVE REPORT     Performed at [facility]   Report Status PENDING   Incomplete    Anti-infectives   Start     Dose/Rate Route Frequency Ordered Stop   06/18/13 1200  vancomycin (VANCOCIN) 500 mg in sodium chloride 0.9 % 100 mL IVPB     500 mg 100 mL/hr over 60 Minutes Intravenous Every M-W-F (Hemodialysis) 06/16/13 2008     06/17/13 1100  vancomycin (VANCOCIN) 1,250 mg in sodium chloride 0.9 % 250 mL IVPB     1,250 mg 166.7 mL/hr over 90 Minutes Intravenous  Once 06/17/13 1031 06/17/13 1308   06/16/13 2015  vancomycin (VANCOCIN) 1,250 mg in sodium chloride 0.9 % 250 mL IVPB  Status:  Discontinued     1,250 mg 166.7 mL/hr over 90 Minutes Intravenous  Once 06/16/13 2008 06/17/13 1030   06/14/13 1430  ceFEPIme (MAXIPIME) 2 g in dextrose 5 % 50 mL IVPB     2 g 100 mL/hr over 30 Minutes Intravenous Every M-W-F (Hemodialysis) 06/14/13 1349     06/12/13 1800  ceFEPIme (MAXIPIME) 2 g in dextrose 5 % 50 mL IVPB     2 g 100 mL/hr over 30  Minutes Intravenous  Once 06/12/13 1205 06/12/13 1814   06/11/13 1700  ceFEPIme (MAXIPIME) 2 g in dextrose 5 % 50 mL IVPB     2 g 100 mL/hr over 30 Minutes Intravenous  Once 06/11/13 1228 06/11/13 1720   06/09/13 1200  ceFEPIme (MAXIPIME) 500 mg in dextrose 5 % 50 mL IVPB  Status:  Discontinued     500 mg 100  mL/hr over 30 Minutes Intravenous Every 24 hours 06/09/13 1014 06/10/13 1006   06/04/13 1400  metroNIDAZOLE (FLAGYL) tablet 250 mg  Status:  Discontinued     250 mg Oral 3 times per day 06/04/13 1031 06/08/13 1123   06/04/13 1200  ceFEPIme (MAXIPIME) 2 g in dextrose 5 % 50 mL IVPB  Status:  Discontinued     2 g 100 mL/hr over 30 Minutes Intravenous Every M-W-F (Hemodialysis) 06/04/13 1040 06/09/13 1014   06/04/13 1100  metroNIDAZOLE (FLAGYL) IVPB 250 mg  Status:  Discontinued     250 mg 50 mL/hr over 60 Minutes Intravenous Every 8 hours 06/04/13 1007 06/04/13 1031   06/03/13 1400  piperacillin-tazobactam (ZOSYN) IVPB 2.25 g  Status:  Discontinued     2.25 g 100 mL/hr over 30 Minutes Intravenous Every 8 hours 06/03/13 0911 06/04/13 1007   06/03/13 1200  piperacillin-tazobactam (ZOSYN) IVPB 3.375 g  Status:  Discontinued     3.375 g 12.5 mL/hr over 240 Minutes Intravenous Every 8 hours 06/03/13 0908 06/03/13 0910   06/02/13 1200  vancomycin (VANCOCIN) 500 mg in sodium chloride 0.9 % 100 mL IVPB  Status:  Discontinued     500 mg 100 mL/hr over 60 Minutes Intravenous Every M-W-F (Hemodialysis) 06/01/13 1652 06/10/13 1011   06/01/13 1015  levofloxacin (LEVAQUIN) IVPB 500 mg  Status:  Discontinued     500 mg 100 mL/hr over 60 Minutes Intravenous Every 24 hours 06/01/13 1010 06/01/13 1555   06/01/13 1000  levofloxacin (LEVAQUIN) tablet 500 mg  Status:  Discontinued    Comments:  Pharmacy to dose   500 mg Oral Daily 05/31/13 2234 06/01/13 1009   06/01/13 0400  piperacillin-tazobactam (ZOSYN) IVPB 2.25 g  Status:  Discontinued     2.25 g 100 mL/hr over 30 Minutes Intravenous Every 8 hours 05/31/13 2238 06/03/13 0908   05/31/13 2000  piperacillin-tazobactam (ZOSYN) IVPB 2.25 g     2.25 g 100 mL/hr over 30 Minutes Intravenous  Once 05/31/13 1936 05/31/13 2023   05/31/13 1949  piperacillin-tazobactam (ZOSYN) 3.375 GM/50ML IVPB    Comments:  Gerlene Burdock   : cabinet override      05/31/13 1949  06/01/13 0759   05/31/13 1945  vancomycin (VANCOCIN) IVPB 1000 mg/200 mL premix     1,000 mg 200 mL/hr over 60 Minutes Intravenous  Once 05/31/13 1936 05/31/13 2130      Assessment: 80 YOF continues on Cefepime day #15 for R-heel osteomyelitis.  Anticoagulation: None PTA despite hx DVT. On SCDs for VTE px.  Infectious Disease: R heel osteo/ulcer (L AKA,) + bacteremia. Likely will need 6 wks. Afebrile, WBC 11.9 (up), ESRD-MWF. No access, abx to be given while at HD, missed cefepime dose 5/11.  * Recently treated with Vanc/Zosyn from 4/9-4/25 at [facility] d/t RUE ischemia >> d/ced home on LVQ.   Vanc 4/27 >> 5/7; resumed 5/14> *5/4 VR 12.4 mcg/ml >> given extra 500 mg x 1 pre-IR procedure, est level ~21.6 (appropriate pre-HD level)  Zosyn 4/27 >>5/1 LVQ 4/28 >>4/28 Cefepime 5/1 >>  Flagl 5/1 >>5/5  4/27  BCx: 1/2 CoNS, Staph lugdunensis (pan sens), no staph aureus isolated 4/29 CDiff PCR negative 5/2 WCx: multiple organisms  Cardiovascular: Hx DL/PVD(L-AKA)/Afib/HTN.  PAD with critical ischemia RUE/RLE . Midodrine TID, plavix, bisoprolol  Endocrinology: Hx DM/hypothyroidism. A1c 6, TSH 0.231 (low) on 05/30/13, T4 wnl on 5/1 >> continued PTA dose 25 mcg, 5/8 TSH 2.8. CBGs 98-106 on SSI  GI / Nutrition: LFTs wnl.   Neurology: Hx end-stage dementia/CVA/PD/depression and admitted after being found unresponsive by family - now back to baseline. On sinemet-IR, stopped donepezil  Nephrology: ESRD-MWF. AVF clotted >> R-femoral HD cath 5/4. Phos 2.7, CoCa wnl 11.04. On renal MV. Aranesp 40/Wed. Nulicit/Friday.  Hematology / Oncology: CBC stable  PTA Med Issues: Sensipar, Prozac (discussed w/ Dr. Alva Garnet, does not think mentally aware enough to experience symptoms of withdrawal), Lantus, Vit D  Best Practices: SCDs    Plan:  - Cefepime 2gm IV qHD, MWF -Vanc 500 mg qHD   Canisha Issac S. Alford Highland, PharmD, [facility] Clinical Staff Pharmacist Pager [phone]  [facility]  Alford Highland 06/18/2013,10:48 AM

## 2013-06-18 NOTE — Progress Notes (Signed)
Discharge instructions and medications discussed with pt and family by day shift RN. Pt showed no barriers to dischargeAssessment unchanged from morning. No IV to remove.  Pt discharged to home with [place] by wheelchair.

## 2013-06-18 NOTE — Progress Notes (Signed)
[facility] ROUNDING NOTE   Subjective:   Interval History: to be discharged home today  Objective:  Vital signs in last 24 hours:  Temp:  [97.4 F (36.3 C)-97.9 F (36.6 C)] 97.8 F (36.6 C) (05/15 1156) Pulse Rate:  [82-100] 100 (05/15 1156) Resp:  [16-20] 18 (05/15 1156) BP: (108-158)/(51-97) 151/69 mmHg (05/15 1156) SpO2:  [99 %-100 %] 100 % (05/15 1156)  Weight change:  Filed Weights   06/16/13 0439 06/16/13 0700 06/16/13 1039  Weight: 62.596 kg (138 lb) 63 kg (138 lb 14.2 oz) 62 kg (136 lb 11 oz)    Intake/Output: I/O last 3 completed shifts: In: 520 [P.O.:240; I.V.:30; IV Piggyback:250] Out: -    Intake/Output this shift:     General: alert, nonverbal. No acute distress  Heart: RRR  Lungs: CTA, Abdomen: soft, nontender +BS  Extremities: L AKA. R foot dressing and boot. R hand dressing  Dialysis Access: R fem cath    Basic Metabolic Panel:  Recent Labs Lab 06/13/13 0525 06/14/13 0830 06/15/13 0730 06/16/13 0500 06/17/13 0610  NA 140 137 137 139 138  K 3.9 4.1 4.5 4.1 4.9  CL 103 101 101 102 100  CO2 22 25 23 27 26   GLUCOSE 67* 62* 125* 153* 113*  BUN 7 9 5* 9 7  CREATININE 1.56* 2.11* 1.30* 1.92* 1.33*  CALCIUM 8.3* 9.0 8.9 9.8 9.2  PHOS 2.0* 2.7 2.0* 2.5 2.7    Liver Function Tests:  Recent Labs Lab 06/13/13 0525 06/14/13 0830 06/15/13 0730 06/16/13 0500 06/17/13 0610  ALBUMIN 1.8* 1.7* 1.7* 1.7* 1.7*   No results found for this basename: LIPASE, AMYLASE,  in the last 168 hours No results found for this basename: AMMONIA,  in the last 168 hours  CBC:  Recent Labs Lab 06/13/13 0525 06/14/13 0830 06/16/13 0500  WBC 10.5 9.3 11.9*  HGB 9.5* 9.6* 9.2*  HCT 28.3* 28.1* 27.3*  MCV 89.6 89.8 88.9  PLT 198 240 269    Cardiac Enzymes: No results found for this basename: CKTOTAL, CKMB, CKMBINDEX, TROPONINI,  in the last 168 hours  BNP: No components found with this basename: POCBNP,   CBG:  Recent Labs Lab  06/17/13 1156 06/17/13 1659 06/17/13 2128 06/18/13 0732 06/18/13 1147  GLUCAP 104* 106* 126* 64 100*    Microbiology: Results for orders placed during the hospital encounter of 05/31/13  CULTURE, BLOOD (ROUTINE X 2)     Status: None   Collection Time    05/31/13  6:27 PM      Result Value Ref Range Status   Specimen Description BLOOD LEFT ANTECUBITAL   Final   Special Requests     Final   Value: BOTTLES DRAWN AEROBIC AND ANAEROBIC 6CC  IMMUNE:COMPROMISED   Culture NO GROWTH 5 DAYS   Final   Report Status 06/05/2013 FINAL   Final  CULTURE, BLOOD (ROUTINE X 2)     Status: None   Collection Time    05/31/13  7:13 PM      Result Value Ref Range Status   Specimen Description BLOOD LEFT HAND   Final   Special Requests BOTTLES DRAWN AEROBIC ONLY 4CC  IMMUNE:COMPROMISED   Final   Culture  Setup Time     Final   Value: 06/03/2013 10:10     Performed at [facility]   Culture     Final   Value: STAPHYLOCOCCUS SPECIES (COAGULASE NEGATIVE)     Note: IDENTIFIED AS STAPHYLOCOCCUS LUGDUNENSIS  Note: Gram Stain Report Called to,Read Back By and Verified With: BULLINS MEGAN AT [id number] ON 06/03/13 BY Tyrone Schimke M Performed at [facility]     Performed at [facility]   Report Status 06/07/2013 FINAL   Final   Organism ID, Bacteria STAPHYLOCOCCUS SPECIES (COAGULASE NEGATIVE)   Final  CLOSTRIDIUM DIFFICILE BY PCR     Status: None   Collection Time    06/02/13  2:30 PM      Result Value Ref Range Status   C difficile by pcr NEGATIVE  NEGATIVE Final  WOUND CULTURE     Status: None   Collection Time    06/05/13  4:04 PM      Result Value Ref Range Status   Specimen Description WOUND RIGHT HEEL   Final   Special Requests IMMUNE:COMPROMISED   Final   Gram Stain     Final   Value: NO WBC SEEN     MODERATE SQUAMOUS EPITHELIAL CELLS PRESENT     ABUNDANT GRAM NEGATIVE RODS     MODERATE GRAM POSITIVE COCCI IN PAIRS     IN CLUSTERS   Culture     Final   Value: MULTIPLE  ORGANISMS PRESENT, NONE PREDOMINANT     Note: NO STAPHYLOCOCCUS AUREUS ISOLATED NO GROUP A STREP (S.PYOGENES) ISOLATED     Performed at [facility]   Report Status 06/08/2013 FINAL   Final  MRSA PCR SCREENING     Status: None   Collection Time    06/06/13  8:14 PM      Result Value Ref Range Status   MRSA by PCR NEGATIVE  NEGATIVE Final   Comment:            The GeneXpert MRSA Assay (FDA     approved for NASAL specimens     only), is one component of a     comprehensive MRSA colonization     surveillance program. It is not     intended to diagnose MRSA     infection nor to guide or     monitor treatment for     MRSA infections.  CULTURE, BLOOD (ROUTINE X 2)     Status: None   Collection Time    06/16/13  1:15 PM      Result Value Ref Range Status   Specimen Description BLOOD LEFT THUMB   Final   Special Requests BOTTLES DRAWN AEROBIC ONLY 3CC   Final   Culture  Setup Time     Final   Value: 06/16/2013 18:55     Performed at [facility]   Culture     Final   Value:        BLOOD CULTURE RECEIVED NO GROWTH TO DATE CULTURE WILL BE HELD FOR 5 DAYS BEFORE ISSUING A FINAL NEGATIVE REPORT     Performed at [facility]   Report Status PENDING   Incomplete  CULTURE, BLOOD (ROUTINE X 2)     Status: None   Collection Time    06/16/13  3:10 PM      Result Value Ref Range Status   Specimen Description BLOOD LEFT HAND   Final   Special Requests BOTTLES DRAWN AEROBIC ONLY [facility]   Final   Culture  Setup Time     Final   Value: 06/16/2013 18:56     Performed at [facility]   Culture     Final   Value:        BLOOD  CULTURE RECEIVED NO GROWTH TO DATE CULTURE WILL BE HELD FOR 5 DAYS BEFORE ISSUING A FINAL NEGATIVE REPORT     Performed at [facility]   Report Status PENDING   Incomplete    Coagulation Studies: No results found for this basename: LABPROT, INR,  in the last 72 hours  Urinalysis: No results found for this basename: COLORURINE,  APPERANCEUR, LABSPEC, PHURINE, GLUCOSEU, HGBUR, BILIRUBINUR, KETONESUR, PROTEINUR, UROBILINOGEN, NITRITE, LEUKOCYTESUR,  in the last 72 hours    Imaging: No results found.   Medications:     . antiseptic oral rinse  15 mL Mouth Rinse BID  . bisoprolol  2.5 mg Oral BID  . carbidopa-levodopa  2 tablet Oral TID  . ceFEPime (MAXIPIME) IV  2 g Intravenous Q M,W,F-HD  . clopidogrel  75 mg Oral Q breakfast  . collagenase   Topical Daily  . darbepoetin (ARANESP) injection - DIALYSIS  40 mcg Intravenous Q Wed-HD  . feeding supplement (RESOURCE BREEZE)  1 Container Oral BID BM  . ferric gluconate (FERRLECIT/NULECIT) IV  62.5 mg Intravenous Q Fri-HD  . insulin aspart  0-5 Units Subcutaneous QHS  . insulin aspart  0-9 Units Subcutaneous TID WC  . levothyroxine  25 mcg Oral QAC breakfast  . midodrine  10 mg Oral TID WC  . multivitamin  1 tablet Oral BID  . vancomycin  500 mg Intravenous Q M,W,F-HD   sodium chloride, heparin, heparin, RESOURCE THICKENUP CLEAR, sodium chloride  Assessment/ Plan:  1. AMS - acute on background of chronic dementia. Palliative signed off 2. Septic shock - improved, off pressors. Cont vanc and Cefepime- per pharmacy 3. Multiple wounds - pressure dermal necrosis lateral R foot and heel, dusky 5th toe; also R hand recent I&D dorsal abcess and removal 3rd finger 4/14.  4. Volume excess - continue with dialysis 5. ESRD - HD on MWF @ [place]; HD pending tomorrow K+4.9 6. Diarrhea - C diff negative.  7. Afib / Cleora Fleet - not candidate for intervention, medical Rx. 8. Access - R AVG clotted, no declot done due to vasc compromise of R hand; has new R fem Diatek. 9. Anemia - Hgb 9.2, Aranesp 40 mcg on Wed  10. Nutrition- alb 1.7. dys diet 11. MBD - Ca 9.2 (11corrected), P 2; 2Ca bath, Hectorol on hold sec to high Ca. 12. Dementia / Parkinson's  13. DM   Patient to be discharge to dialysis center recommended Vancomycin and Tressie Ellis - stop date June 25th  Coagulase negative  staph     LOS: [place] @TODAY @1 :08 PM

## 2013-06-22 LAB — CULTURE, BLOOD (ROUTINE X 2)
Culture: NO GROWTH
Culture: NO GROWTH

## 2013-06-22 NOTE — Consult Note (Signed)
I have reviewed and discussed the care of this patient in detail with the nurse practitioner including pertinent patient records, physical exam findings and data. I agree with details of this encounter.  

## 2013-07-05 DEATH — deceased

## 2014-03-17 ENCOUNTER — Encounter (INDEPENDENT_AMBULATORY_CARE_PROVIDER_SITE_OTHER): Payer: Self-pay | Admitting: *Deleted

## 2014-04-11 ENCOUNTER — Ambulatory Visit (INDEPENDENT_AMBULATORY_CARE_PROVIDER_SITE_OTHER): Payer: Medicare Other | Admitting: Internal Medicine

## 2014-04-26 ENCOUNTER — Encounter (INDEPENDENT_AMBULATORY_CARE_PROVIDER_SITE_OTHER): Payer: Self-pay | Admitting: *Deleted

## 2014-04-26 ENCOUNTER — Telehealth (INDEPENDENT_AMBULATORY_CARE_PROVIDER_SITE_OTHER): Payer: Self-pay | Admitting: *Deleted

## 2014-04-26 NOTE — Telephone Encounter (Signed)
Oriah NO SHOWED for her apt with Deberah Castle, NP on 04/11/14. A NS letter has been mailed.

## 2015-02-22 IMAGING — CR DG CHEST 1V PORT
1 series · 1 of 1 positions shown · non-contrast
Comparison: Single view of the chest 02/14/2013.

CLINICAL DATA: Altered mental status.

EXAM:
PORTABLE CHEST - 1 VIEW

[AP]
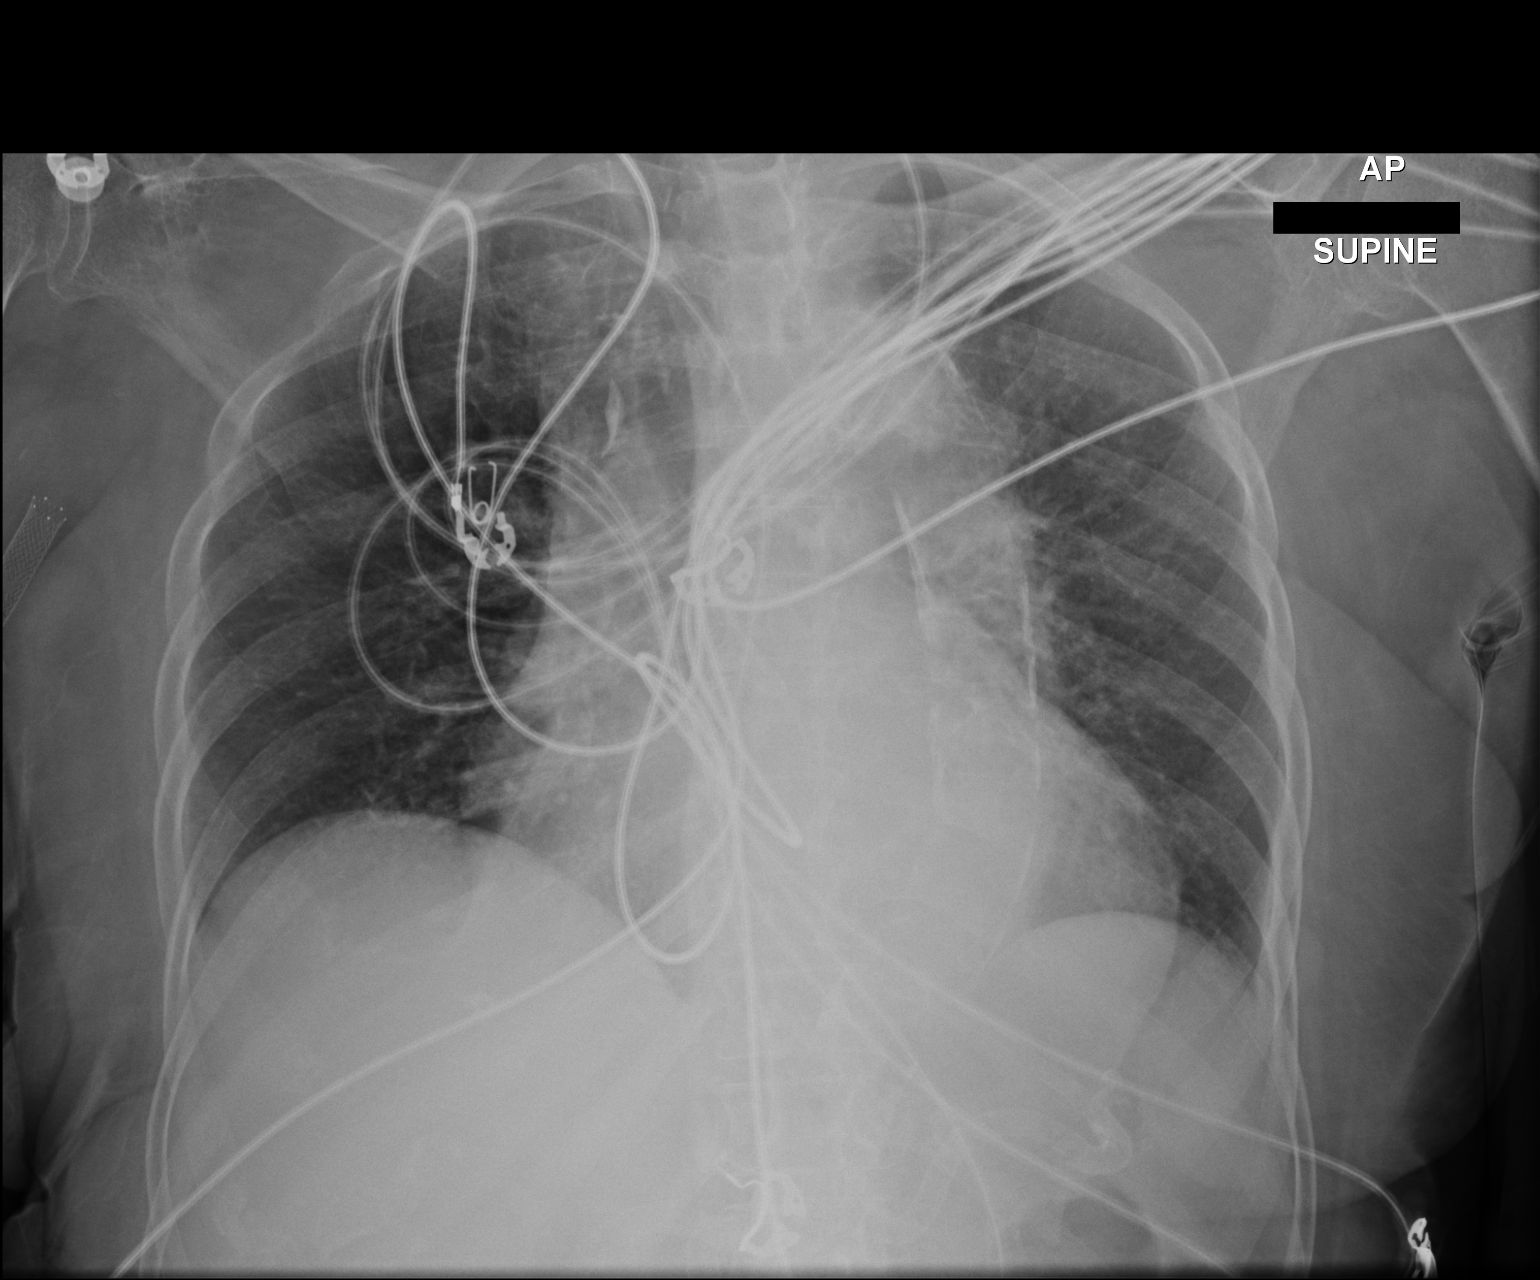

[1 of 1 positions shown; findings below may reference images not displayed]

FINDINGS: There is cardiomegaly without edema. The aorta is ectatic with
atherosclerosis noted. Lungs are clear. No pneumothorax or pleural
effusion. No focal bony abnormality.
IMPRESSION: Cardiomegaly without acute disease.

## 2015-02-23 IMAGING — CR DG CHEST 1V PORT
1 series · 1 of 1 positions shown · non-contrast
Comparison: Portable exam 5055 hr compared to 03/02/2013

CLINICAL DATA: Intubation, history Parkinson's, diabetes,
hypertension, end-stage renal disease on dialysis, atrial
fibrillation

EXAM:
PORTABLE CHEST - 1 VIEW

[AP]
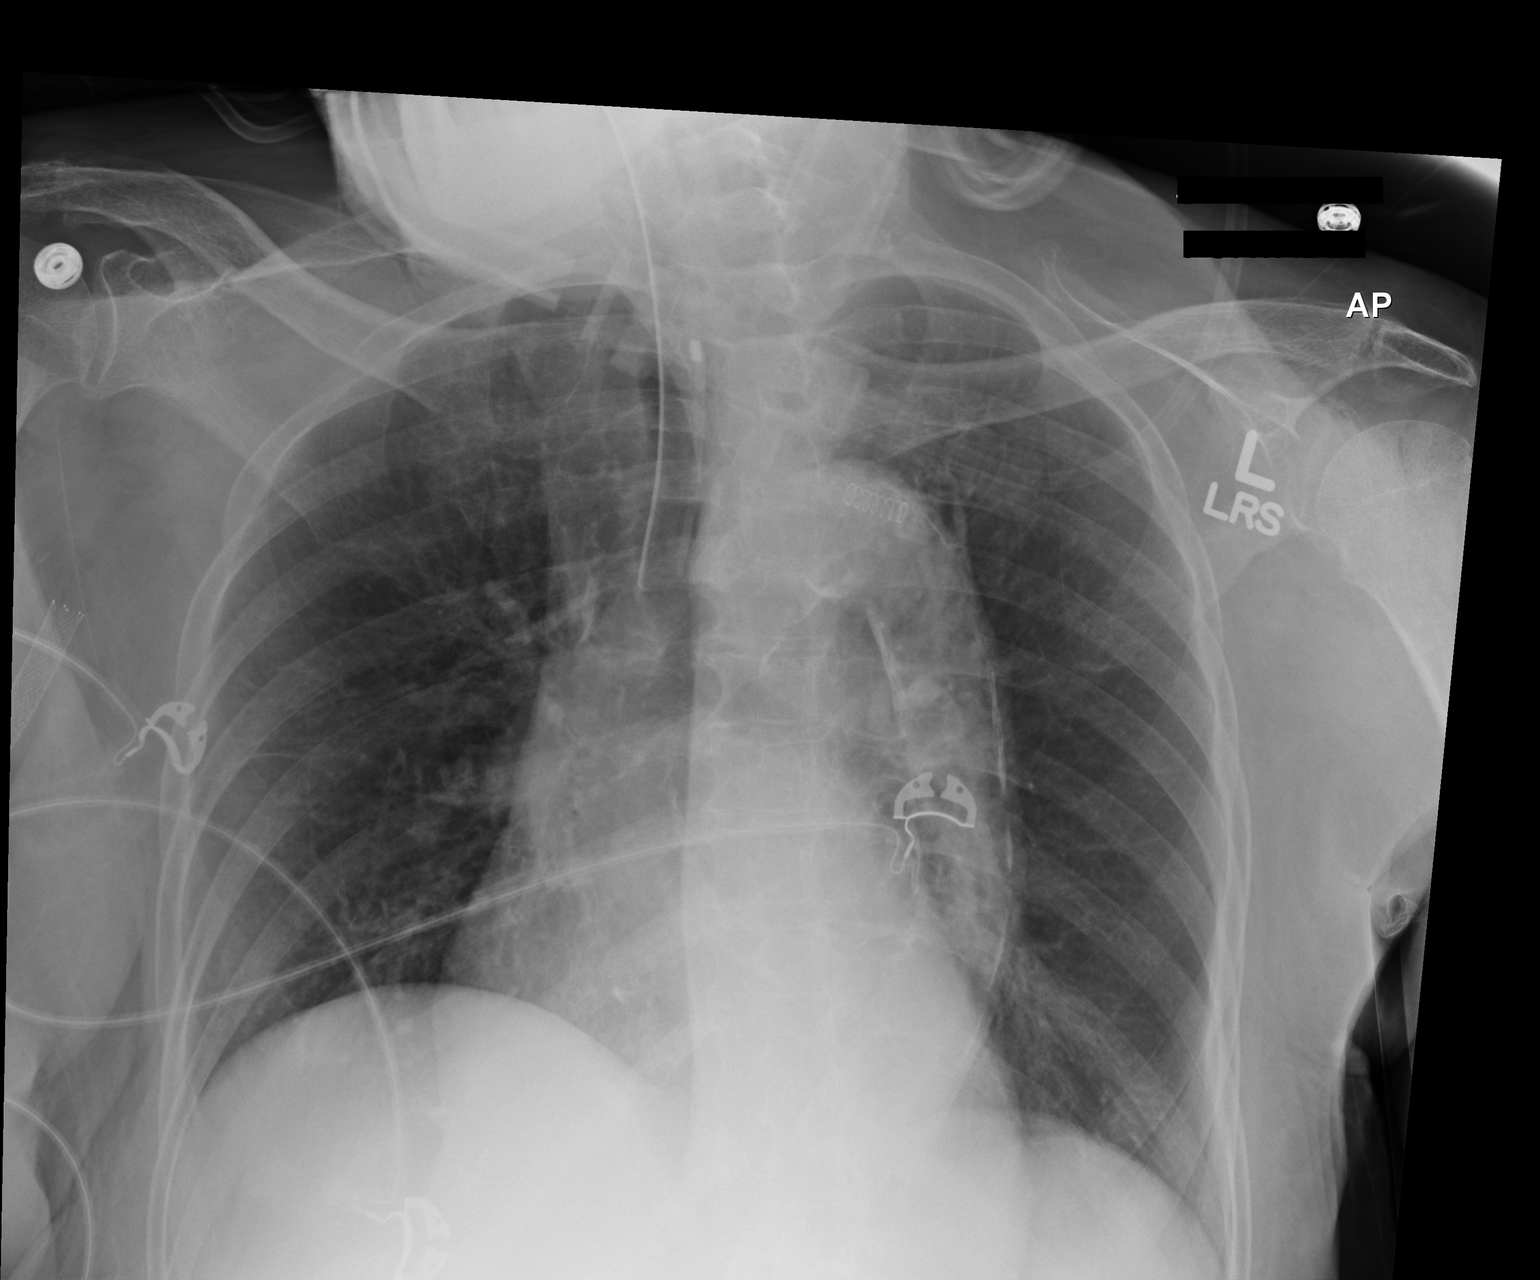

[1 of 1 positions shown; findings below may reference images not displayed]

FINDINGS: Tip of endotracheal tube projects 3.4 cm above carina.

Upper normal heart size.

Calcified tortuous thoracic aorta.

Slightly rotated to the right.

Pulmonary vascularity mediastinal contours otherwise normal.

Lungs clear.

No pleural effusion or pneumothorax.
IMPRESSION: No acute abnormalities.

## 2015-05-23 IMAGING — CT CT HEAD W/O CM
1 series · 15 of 30 positions shown, 19 images · non-contrast
Comparison: Prior CT from 03/02/2013

CLINICAL DATA: Altered mental status

EXAM:
CT HEAD WITHOUT CONTRAST
TECHNIQUE: Contiguous axial images were obtained from the base of the skull
through the vertex without intravenous contrast.

[Series 2: headseq 4.8 h37s · axial · 0.43mm/px · z∈[+148,+290]mm · 15 of 30 slices shown, 19 images]
[im 2/30  brain]
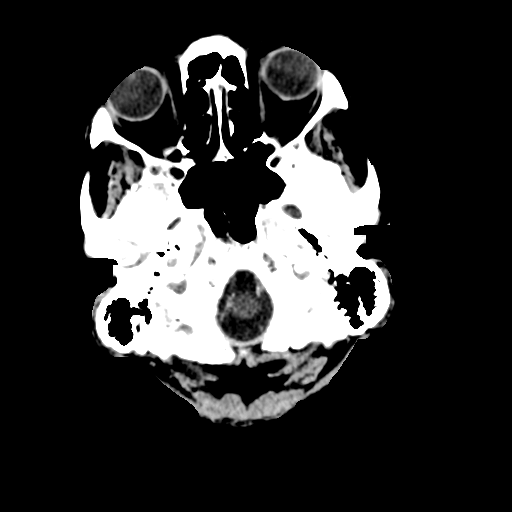
[im 2/30  bone]
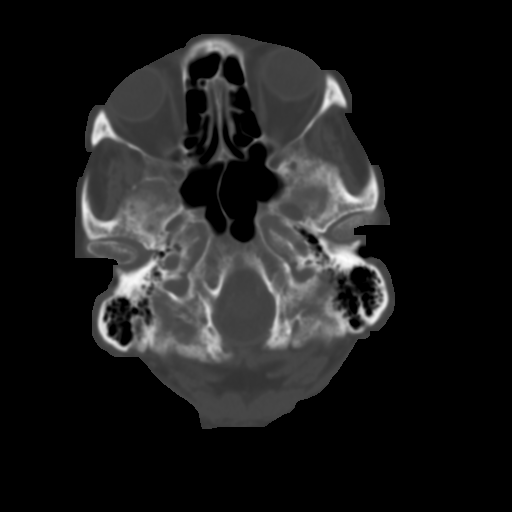
[im 4/30  brain]
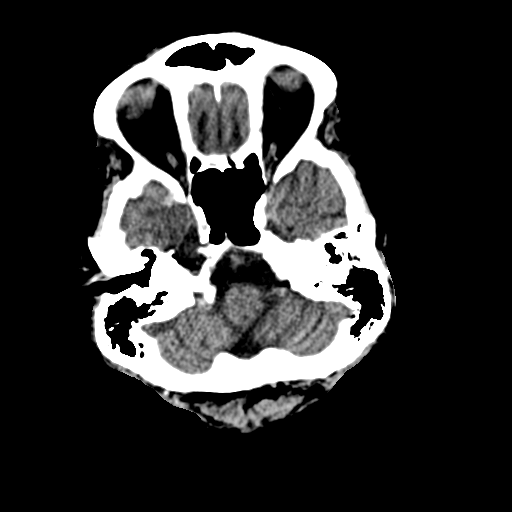
[im 6/30  brain]
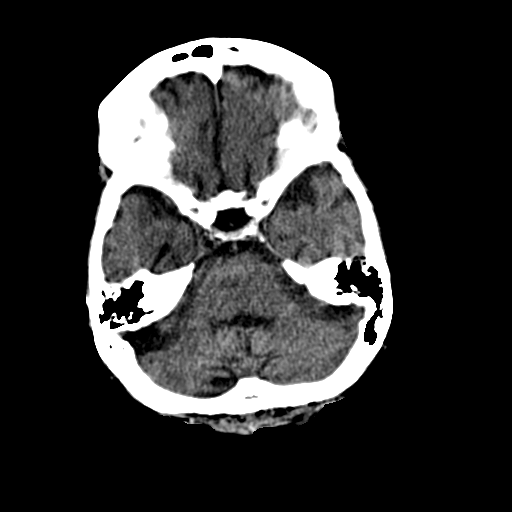
[im 8/30  brain]
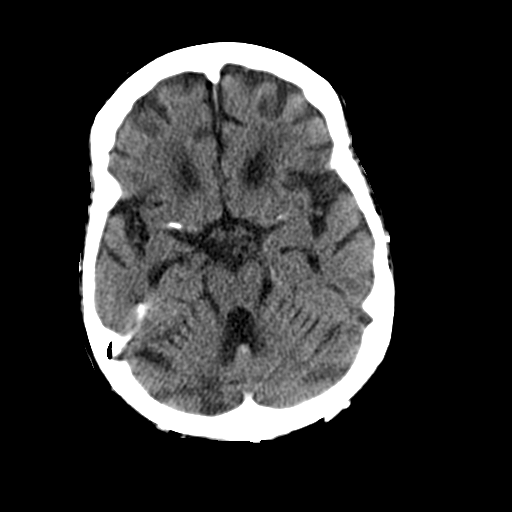
[im 10/30  brain]
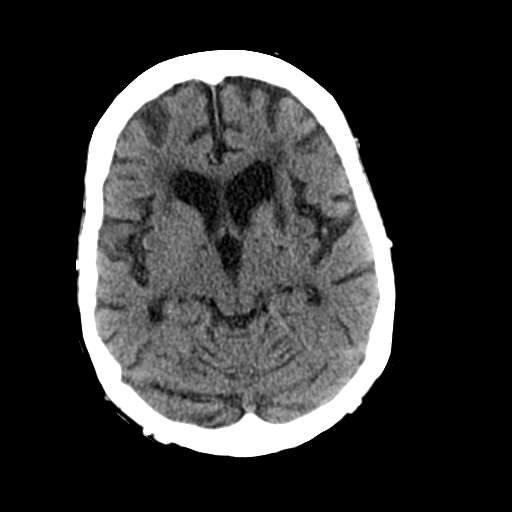
[im 10/30  bone]
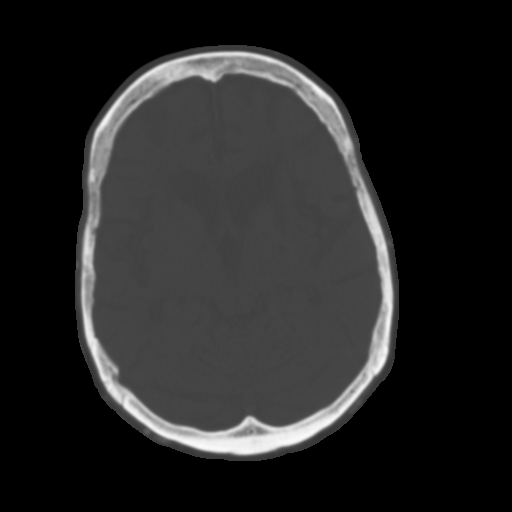
[im 12/30  brain]
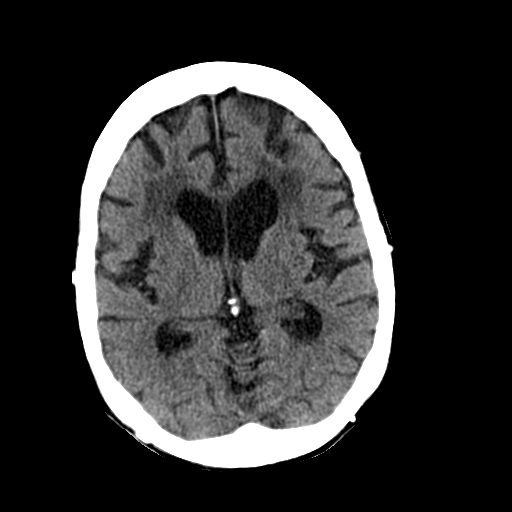
[im 14/30  brain]
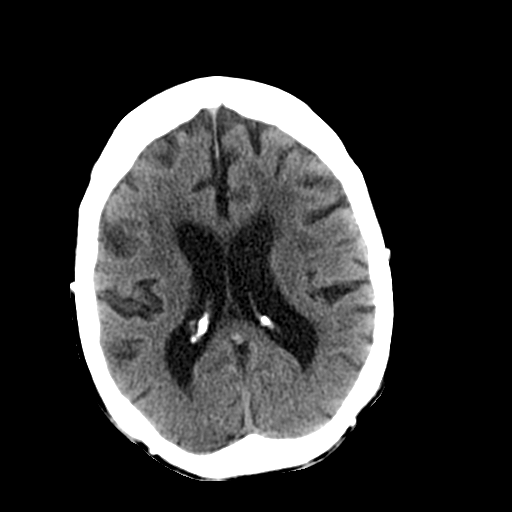
[im 16/30  brain]
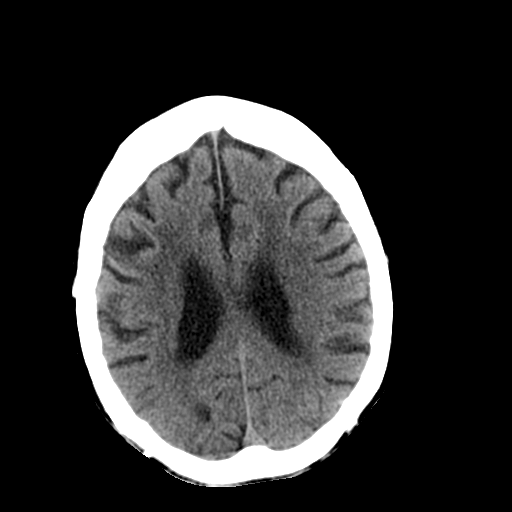
[im 17/30  brain]
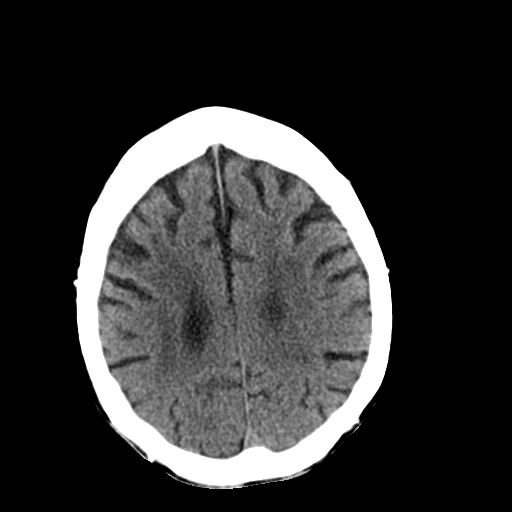
[im 17/30  bone]
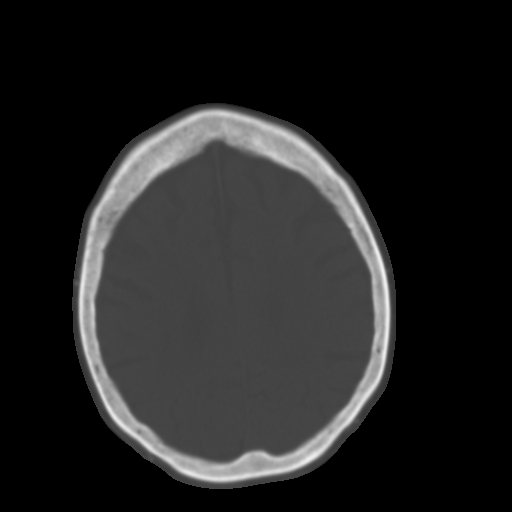
[im 19/30  brain]
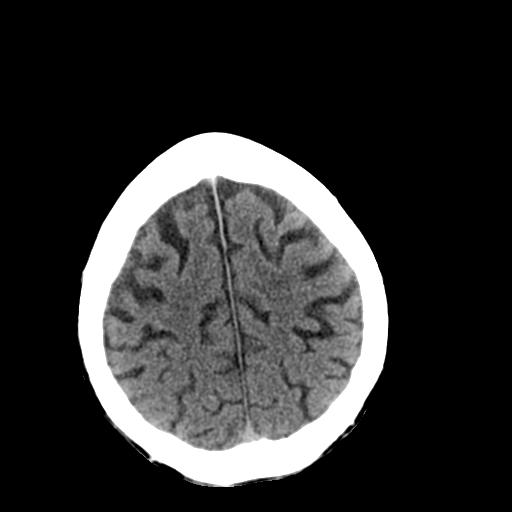
[im 21/30  brain]
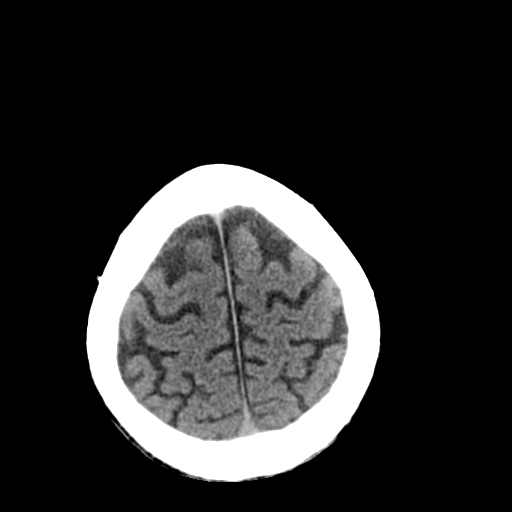
[im 23/30  brain]
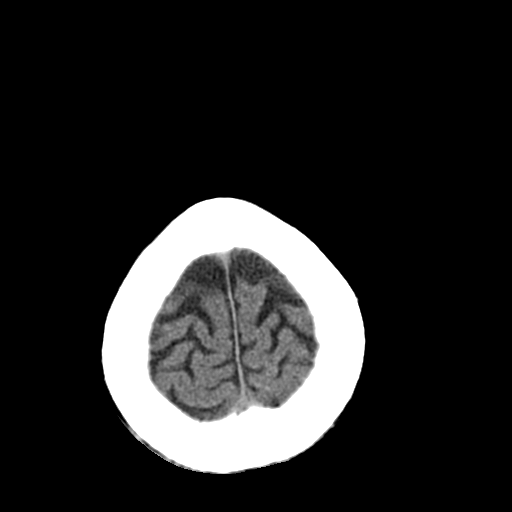
[im 25/30  brain]
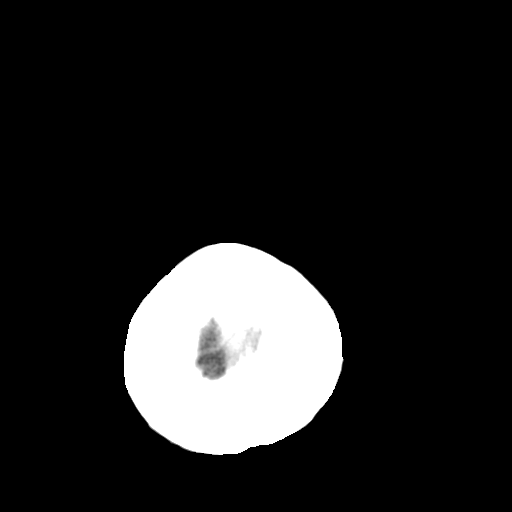
[im 25/30  bone]
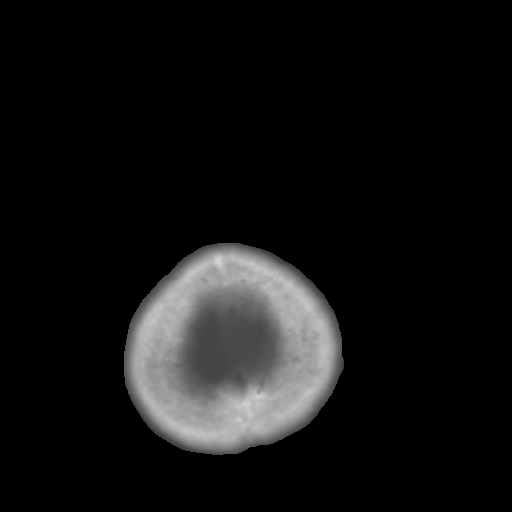
[im 27/30  brain]
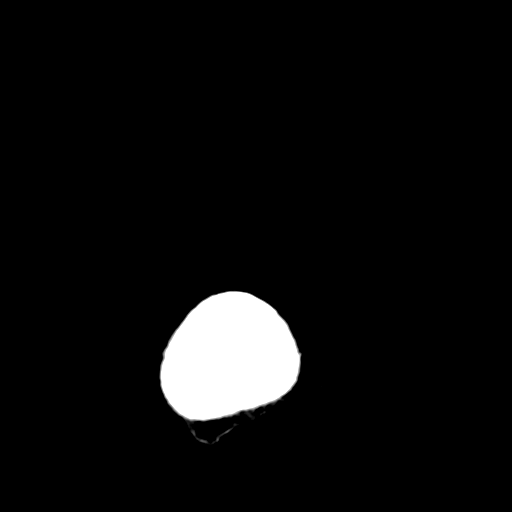
[im 29/30  brain]
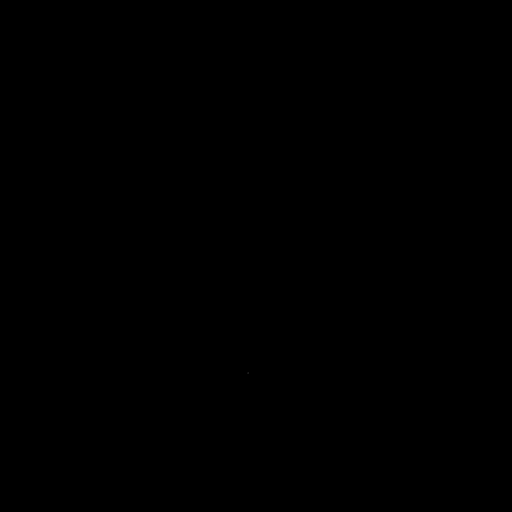

[15 of 30 positions shown; findings below may reference images not displayed]

FINDINGS: Diffuse prominence of the CSF containing spaces is compatible with
generalized cerebral atrophy. Scattered and confluent hypodensity
within the periventricular and deep white matter of both cerebral
hemispheres is most compatible with moderate chronic microvascular
ischemic changes. Remote left basal ganglia infarcts noted,
unchanged.

There is no acute intracranial hemorrhage or infarct. No mass lesion
or midline shift. Gray-white matter differentiation is well
maintained. Ventricles are normal in size without evidence of
hydrocephalus. CSF containing spaces are within normal limits. No
extra-axial fluid collection.

The calvarium is intact.

Orbital soft tissues are within normal limits.

The paranasal sinuses and mastoid air cells are well pneumatized and
free of fluid.

Scalp soft tissues are unremarkable.
IMPRESSION: 1. No acute intracranial process.
2. Stable atrophy with moderate chronic microvascular ischemic
disease involving the supratentorial white matter.
3. Remote left basal ganglia infarct.
